# Patient Record
Sex: Female | Born: 1960 | Race: Black or African American | Hispanic: No | Marital: Married | Smoking: Never smoker
Health system: Southern US, Community
[De-identification: ages and names within clinical notes are randomized; demographics above are authoritative.]

## PROBLEM LIST (undated history)

## (undated) DIAGNOSIS — Z95810 Presence of automatic (implantable) cardiac defibrillator: Secondary | ICD-10-CM

## (undated) DIAGNOSIS — I5022 Chronic systolic (congestive) heart failure: Secondary | ICD-10-CM

## (undated) DIAGNOSIS — R51 Headache: Secondary | ICD-10-CM

## (undated) DIAGNOSIS — Z87442 Personal history of urinary calculi: Secondary | ICD-10-CM

## (undated) DIAGNOSIS — I428 Other cardiomyopathies: Secondary | ICD-10-CM

## (undated) DIAGNOSIS — R519 Headache, unspecified: Secondary | ICD-10-CM

## (undated) DIAGNOSIS — I509 Heart failure, unspecified: Secondary | ICD-10-CM

## (undated) DIAGNOSIS — I4729 Other ventricular tachycardia: Secondary | ICD-10-CM

## (undated) DIAGNOSIS — I472 Ventricular tachycardia: Secondary | ICD-10-CM

## (undated) DIAGNOSIS — I10 Essential (primary) hypertension: Secondary | ICD-10-CM

## (undated) DIAGNOSIS — N132 Hydronephrosis with renal and ureteral calculous obstruction: Secondary | ICD-10-CM

## (undated) DIAGNOSIS — Z91148 Patient's other noncompliance with medication regimen for other reason: Secondary | ICD-10-CM

## (undated) DIAGNOSIS — Z9114 Patient's other noncompliance with medication regimen: Secondary | ICD-10-CM

## (undated) DIAGNOSIS — I4891 Unspecified atrial fibrillation: Secondary | ICD-10-CM

## (undated) DIAGNOSIS — E669 Obesity, unspecified: Secondary | ICD-10-CM

## (undated) HISTORY — PX: TUBAL LIGATION: SHX77

## (undated) HISTORY — PX: CHOLECYSTECTOMY: SHX55

## (undated) HISTORY — PX: ABDOMINAL HYSTERECTOMY: SHX81

## (undated) HISTORY — DX: Obesity, unspecified: E66.9

## (undated) HISTORY — PX: TONSILLECTOMY: SUR1361

## (undated) HISTORY — PX: CARDIAC CATHETERIZATION: SHX172

---

## 1987-10-28 HISTORY — PX: BREAST LUMPECTOMY: SHX2

## 2000-06-03 ENCOUNTER — Other Ambulatory Visit: Admission: RE | Admit: 2000-06-03 | Discharge: 2000-06-03 | Payer: Self-pay | Admitting: Obstetrics and Gynecology

## 2000-08-20 ENCOUNTER — Encounter (INDEPENDENT_AMBULATORY_CARE_PROVIDER_SITE_OTHER): Payer: Self-pay | Admitting: Specialist

## 2000-08-20 ENCOUNTER — Inpatient Hospital Stay (HOSPITAL_COMMUNITY): Admission: RE | Admit: 2000-08-20 | Discharge: 2000-08-21 | Payer: Self-pay | Admitting: Obstetrics and Gynecology

## 2001-02-11 ENCOUNTER — Encounter: Payer: Self-pay | Admitting: Internal Medicine

## 2001-02-11 ENCOUNTER — Ambulatory Visit (HOSPITAL_COMMUNITY): Admission: RE | Admit: 2001-02-11 | Discharge: 2001-02-11 | Payer: Self-pay | Admitting: Internal Medicine

## 2001-07-16 ENCOUNTER — Other Ambulatory Visit: Admission: RE | Admit: 2001-07-16 | Discharge: 2001-07-16 | Payer: Self-pay | Admitting: Obstetrics and Gynecology

## 2002-09-14 ENCOUNTER — Emergency Department (HOSPITAL_COMMUNITY): Admission: EM | Admit: 2002-09-14 | Discharge: 2002-09-14 | Payer: Self-pay | Admitting: Emergency Medicine

## 2002-11-06 ENCOUNTER — Emergency Department (HOSPITAL_COMMUNITY): Admission: EM | Admit: 2002-11-06 | Discharge: 2002-11-06 | Payer: Self-pay | Admitting: Emergency Medicine

## 2002-11-06 ENCOUNTER — Encounter: Payer: Self-pay | Admitting: Emergency Medicine

## 2005-02-15 ENCOUNTER — Emergency Department (HOSPITAL_COMMUNITY): Admission: EM | Admit: 2005-02-15 | Discharge: 2005-02-15 | Payer: Self-pay | Admitting: Emergency Medicine

## 2005-06-05 ENCOUNTER — Ambulatory Visit (HOSPITAL_COMMUNITY): Admission: RE | Admit: 2005-06-05 | Discharge: 2005-06-05 | Payer: Self-pay | Admitting: Internal Medicine

## 2006-04-25 ENCOUNTER — Emergency Department (HOSPITAL_COMMUNITY): Admission: EM | Admit: 2006-04-25 | Discharge: 2006-04-25 | Payer: Self-pay | Admitting: Emergency Medicine

## 2006-05-19 ENCOUNTER — Inpatient Hospital Stay (HOSPITAL_COMMUNITY): Admission: EM | Admit: 2006-05-19 | Discharge: 2006-05-20 | Payer: Self-pay | Admitting: Emergency Medicine

## 2006-05-20 ENCOUNTER — Ambulatory Visit: Payer: Self-pay | Admitting: Gastroenterology

## 2006-06-21 ENCOUNTER — Emergency Department (HOSPITAL_COMMUNITY): Admission: EM | Admit: 2006-06-21 | Discharge: 2006-06-21 | Payer: Self-pay | Admitting: Emergency Medicine

## 2006-06-28 ENCOUNTER — Emergency Department (HOSPITAL_COMMUNITY): Admission: EM | Admit: 2006-06-28 | Discharge: 2006-06-28 | Payer: Self-pay | Admitting: Emergency Medicine

## 2006-07-21 ENCOUNTER — Encounter (HOSPITAL_COMMUNITY): Admission: RE | Admit: 2006-07-21 | Discharge: 2006-07-25 | Payer: Self-pay | Admitting: Internal Medicine

## 2006-08-25 ENCOUNTER — Ambulatory Visit (HOSPITAL_COMMUNITY): Admission: RE | Admit: 2006-08-25 | Discharge: 2006-08-25 | Payer: Self-pay | Admitting: Internal Medicine

## 2009-05-30 ENCOUNTER — Ambulatory Visit (HOSPITAL_COMMUNITY): Admission: RE | Admit: 2009-05-30 | Discharge: 2009-05-30 | Payer: Self-pay | Admitting: Internal Medicine

## 2010-07-05 ENCOUNTER — Ambulatory Visit (HOSPITAL_COMMUNITY): Admission: RE | Admit: 2010-07-05 | Discharge: 2010-07-05 | Payer: Self-pay | Admitting: Internal Medicine

## 2010-07-12 ENCOUNTER — Emergency Department (HOSPITAL_COMMUNITY): Admission: EM | Admit: 2010-07-12 | Discharge: 2010-07-12 | Payer: Self-pay | Admitting: Emergency Medicine

## 2010-10-06 ENCOUNTER — Inpatient Hospital Stay (HOSPITAL_COMMUNITY): Admission: EM | Admit: 2010-10-06 | Discharge: 2010-10-10 | Payer: Self-pay | Source: Home / Self Care

## 2010-10-07 ENCOUNTER — Encounter (INDEPENDENT_AMBULATORY_CARE_PROVIDER_SITE_OTHER): Payer: Self-pay | Admitting: Internal Medicine

## 2010-10-18 ENCOUNTER — Encounter: Payer: Self-pay | Admitting: Cardiology

## 2010-10-18 ENCOUNTER — Ambulatory Visit: Payer: Self-pay | Admitting: Cardiology

## 2010-10-18 DIAGNOSIS — I429 Cardiomyopathy, unspecified: Secondary | ICD-10-CM

## 2010-10-18 DIAGNOSIS — I10 Essential (primary) hypertension: Secondary | ICD-10-CM | POA: Insufficient documentation

## 2010-10-22 ENCOUNTER — Ambulatory Visit
Admission: RE | Admit: 2010-10-22 | Discharge: 2010-10-22 | Payer: Self-pay | Source: Home / Self Care | Attending: Cardiology | Admitting: Cardiology

## 2010-10-22 ENCOUNTER — Encounter: Payer: Self-pay | Admitting: Cardiology

## 2010-10-22 LAB — CONVERTED CEMR LAB
Calcium: 9.8 mg/dL (ref 8.4–10.5)
Glucose, Bld: 83 mg/dL (ref 70–99)

## 2010-10-27 DIAGNOSIS — I429 Cardiomyopathy, unspecified: Secondary | ICD-10-CM

## 2010-10-27 HISTORY — DX: Cardiomyopathy, unspecified: I42.9

## 2010-11-13 ENCOUNTER — Ambulatory Visit
Admission: RE | Admit: 2010-11-13 | Discharge: 2010-11-13 | Payer: Self-pay | Source: Home / Self Care | Attending: Cardiology | Admitting: Cardiology

## 2010-11-13 ENCOUNTER — Encounter: Payer: Self-pay | Admitting: Cardiology

## 2010-11-16 ENCOUNTER — Encounter: Payer: Self-pay | Admitting: Internal Medicine

## 2010-11-17 ENCOUNTER — Encounter: Payer: Self-pay | Admitting: Internal Medicine

## 2010-11-28 NOTE — Letter (Signed)
Summary: [place] [employer], agricultural at [facility]  [address], [place] [address]   Phone: [phone]  Fax: [phone]      October 22, 2010 MRN: [id number]   [facility] [address] [place], [place]  [address]   Dear Ms. [name],  Your test ordered by Selena Batten has been reviewed by your physician (or physician assistant) and was found to be normal or stable. Your physician (or physician assistant) felt no changes were needed at this time.  ____ Echocardiogram  ____ Cardiac Stress Test  __X__ Lab Work  ____ Peripheral vascular study of arms, legs or neck  ____ CT scan or X-ray  ____ Lung or Breathing test  ____ Other:  Please continue on current medical treatment.   Thank you.   Valera Castle, MD, F.A.C.C

## 2010-11-28 NOTE — Assessment & Plan Note (Signed)
Summary: rov 4 week f/u   Visit Type:  4 wk f/u Primary Provider:  Lonna Cobb  CC:  sob.Marland KitchenMarland KitchenMarland KitchenMarland Kitchenpt was told recently she had Torticollis 10/29/10.....  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  She says her blood pressure running around 1:15 systolic. It's been checked several times by family member who is a Copy. Every time she comes in the office is elevated. She says her resting heart rate is been running in the mid-to high 70s.  She denies orthopnea, PND or edema. Her stamina has increased. She is anxious to get back to work or to receive her temperature stability. It has been a problem with the paperwork.  Magnesium oxide was added by primary care.  She is very careful with sodium. She has lost another 4 pounds.    Current Medications (verified): 1)  Carvedilol 12.5 Mg Tabs (Carvedilol) .... Take 1 Tablet By Mouth Two Times A Day 2)  Furosemide 20 Mg Tabs (Furosemide) .... As Needed When Weight Is Up 2 Lb's or More 3)  Hydralazine Hcl 50 Mg Tabs (Hydralazine Hcl) .... Take 1 Tablet By Mouth Three Times A Day 4)  Isosorbide Dinitrate 20 Mg Tabs (Isosorbide Dinitrate) .... Take 1 Tab Two Times A Day 5)  Spironolactone 25 Mg Tabs (Spironolactone) .... Take 1 Tab Daily 6)  Proair Hfa 108 (90 Base) Mcg/act Aers (Albuterol Sulfate) .... Use As Directed 7)  Magnesium Oxide 400 Mg Tabs (Magnesium Oxide) .Marland Kitchen.. 1 Tab Four Times Daily 8)  Hydrocodone-Acetaminophen 5-500 Mg Tabs (Hydrocodone-Acetaminophen) .Marland Kitchen.. 1 Tab Four Times Daily As Needed  Allergies (verified): 1)  ! Morphine 2)  ! Ace Inhibitors  Past History:  Past Medical History: Last updated: 10/16/2010 dilated cardiomyopathy grade 1 diastolic dysfunction hypertension pericardial effusion nonsustained ventricular tachycardia hypokalemia  Past Surgical History: Last updated: 10/16/2010 child  birth  Social History: Last updated: 10/16/2010 Full Time Tobacco Use - No.  Alcohol Use - no Regular Exercise - no Drug Use - no  Risk Factors: Exercise: no (10/16/2010)  Risk Factors: Smoking Status: never (10/18/2010)  Review of Systems       negative history of present illness  Vital Signs:  Patient profile:   50 year old female Height:      64 inches Weight:      201.25 pounds BMI:     34.67 Pulse rate:   100 / minute Pulse rhythm:   irregular BP sitting:   150 / 110  (left arm) Cuff size:   large  Vitals Entered By: Danielle Rankin, CMA (November 13, 2010 1:45 PM)  Physical Exam  General:  overweight, in no acute distress Head:  normocephalic and atraumatic Eyes:  PERRLA/EOM intact; conjunctiva and lids normal. Neck:  Neck supple, no JVD. No masses, thyromegaly or abnormal cervical nodes. Lungs:  Clear bilaterally to auscultation and percussion. Heart:  PMI displaced inferolaterally, regular rate and rhythm, no gallop. Msk:  Back normal, normal gait. Muscle strength and tone normal. Pulses:  pulses normal in all 4 extremities Extremities:  No clubbing or cyanosis.no edema Neurologic:  Alert and oriented x 3. Skin:  Intact without lesions or rashes. Psych:  Normal affect.   Impression & Recommendations:  Problem # 1:  CARDIOMYOPATHY, SECONDARY (ICD-425.9) Will increase her carvedilol to 25 mg twice a day. Schedule echocardiogram first week of March. She will see me at that time. Her updated medication list for this problem includes:  Carvedilol 25 Mg Tabs (Carvedilol) .Marland Kitchen... Take one tablet by mouth twice a day    Furosemide 20 Mg Tabs (Furosemide) .Marland Kitchen... As needed when weight is up 2 lb's or more    Isosorbide Dinitrate 20 Mg Tabs (Isosorbide dinitrate) .Marland Kitchen... Take 1 tab two times a day    Spironolactone 25 Mg Tabs (Spironolactone) .Marland Kitchen... Take 1 tab daily  Orders: EKG w/ Interpretation (93000) Echocardiogram (Echo)  Problem # 2:  CHRONIC SYSTOLIC HEART  FAILURE (ICD-428.22)  Her updated medication list for this problem includes:    Carvedilol 25 Mg Tabs (Carvedilol) .Marland Kitchen... Take one tablet by mouth twice a day    Furosemide 20 Mg Tabs (Furosemide) .Marland Kitchen... As needed when weight is up 2 lb's or more    Isosorbide Dinitrate 20 Mg Tabs (Isosorbide dinitrate) .Marland Kitchen... Take 1 tab two times a day    Spironolactone 25 Mg Tabs (Spironolactone) .Marland Kitchen... Take 1 tab daily  Orders: EKG w/ Interpretation (93000) Echocardiogram (Echo)  Problem # 3:  HYPERTENSION (ICD-401.9)  Her updated medication list for this problem includes:    Carvedilol 25 Mg Tabs (Carvedilol) .Marland Kitchen... Take one tablet by mouth twice a day    Furosemide 20 Mg Tabs (Furosemide) .Marland Kitchen... As needed when weight is up 2 lb's or more    Hydralazine Hcl 100 Mg Tabs (Hydralazine hcl) .Marland Kitchen... Take 1 tablet twice a day    Spironolactone 25 Mg Tabs (Spironolactone) .Marland Kitchen... Take 1 tab daily  Patient Instructions: 1)  Your physician recommends that you schedule a follow-up appointment in: 1st week of March with Dr. Daleen Squibb after ECHO 2)  Your physician has recommended you make the following change in your medication:  3)  Your physician has requested that you have an echocardiogram.  Echocardiography is a painless test that uses sound waves to create images of your heart. It provides your doctor with information about the size and shape of your heart and how well your heart's chambers and valves are working.  This procedure takes approximately one hour. There are no restrictions for this procedure. 1st week of March. Appt with Dr. Daleen Squibb to follow. Prescriptions: HYDRALAZINE HCL 100 MG TABS (HYDRALAZINE HCL) Take 1 tablet twice a day  #60 x 11   Entered by:   Lisabeth Devoid RN   Authorized by:   Gaylord Shih, MD, [facility]   Signed by:   Lisabeth Devoid RN on 11/13/2010   Method used:   Electronically to        [facility] (retail)       [address] [place], [place]  [address]        Ph: [phone]       Fax: [phone]   RxID:   [id number] CARVEDILOL 25 MG TABS (CARVEDILOL) Take one tablet by mouth twice a day  #60 x 11   Entered by:   Lisabeth Devoid RN   Authorized by:   Gaylord Shih, MD, [facility]   Signed by:   Lisabeth Devoid RN on 11/13/2010   Method used:   Electronically to        [facility] (retail)       [address]       [place], [place]  [address]       Ph: [phone]       Fax: [phone]   RxID:   [id number]

## 2010-11-28 NOTE — Assessment & Plan Note (Signed)
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 She is ejection fraction around 10-15%. Please for the discharge summary.  She also has severe and difficult to control hypertension. This may contribute to her cardiomyopathy as well.  She's been very compliant she is she's gone home. I have put her out of work for rest. She is watching salt. Her weight is down considerably since going home. She weighed 194 this morning.  Her blood pressure and running around 120/80 at rest. Her she denies orthopnea, PND or edema. She's had no chest pain. No palpitations and no syncope or presyncope.  Preventive Screening-Counseling & Management  Alcohol-Tobacco     Smoking Status: never  Current Medications (verified): 1)  Carvedilol 12.5 Mg Tabs (Carvedilol) .... Take 1 Tablet By Mouth Two Times A Day 2)  Furosemide 20 Mg Tabs (Furosemide) .... Take 1 Tab Daily 3)  Hydralazine Hcl 50 Mg Tabs (Hydralazine Hcl) .... Take 1 Tablet By Mouth Three Times A Day 4)  Isosorbide Dinitrate 20 Mg Tabs (Isosorbide Dinitrate) .... Take 1 Tab Two Times A Day 5)  Spironolactone 25 Mg Tabs (Spironolactone) .... Take 1 Tab Daily 6)  Proair Hfa 108 (90 Base) Mcg/act Aers (Albuterol Sulfate) .... Use As Directed  Allergies (verified): No Known Drug Allergies  Comments:  Nurse/Medical Assistant: The patient's medications were reviewed with the patient and were updated in the Medication List. Bottles reviewed w/ patient. Tammi Romine CMA (October 18, 2010 2:13 PM)  Past History:  Past Medical History: Last updated: 10/16/2010 dilated cardiomyopathy grade 1 diastolic dysfunction hypertension pericardial effusion nonsustained ventricular tachycardia hypokalemia  Past Surgical History: Last updated: 10/16/2010 child birth  Social History: Last updated: 10/16/2010 Full  Time Tobacco Use - No.  Alcohol Use - no Regular Exercise - no Drug Use - no  Risk Factors: Exercise: no (10/16/2010)  Risk Factors: Smoking Status: never (10/18/2010)  Review of Systems       negative other than history of present illness  Vital Signs:  Patient profile:   50 year old female Height:      64 inches Weight:      203 pounds BMI:     34.97 O2 Sat:      98 % on Room air Pulse rate:   104 / minute BP sitting:   139 / 92  (left arm) Cuff size:   regular  Vitals Entered By: Fuller Plan CMA (October 18, 2010 2:14 PM)  Nutrition Counseling: Patient's BMI is greater than 25 and therefore counseled on weight management options.  O2 Flow:  Room air  Physical Exam  General:  obese.  pleasant, no acute distress Head:  normocephalic and atraumatic Eyes:  PERRLA/EOM intact; conjunctiva and lids normal. Neck:  Neck supple, no JVD. No masses, thyromegaly or abnormal cervical nodes. Chest Yvonna Brun:  no deformities or breast masses noted Lungs:  Clear bilaterally to auscultation and percussion. Heart:  PMI displaced inferolaterally, no S3 gallop today. Msk:  Back normal, normal gait. Muscle strength and tone normal. Pulses:  pulses normal in all 4 extremities Extremities:  No clubbing or cyanosis.no edema Skin:  Intact without lesions or rashes. Psych:  Normal affect.   Impression & Recommendations:  Problem # 1:  CARDIOMYOPATHY, SECONDARY (ICD-425.9) Assessment Unchanged Her heart rate is too fast and her blood pressure still low but high. We'll increase her carvedilol to 12-1/2  mg twice a day and her hydralazine to 50 mg t.i.d. I will have her return next week for blood pressure and heart rate check. Repeat echocardiogram mid-March. Pray for recovery. Her updated medication list for this problem includes:    Carvedilol 12.5 Mg Tabs (Carvedilol) .Marland Kitchen... Take 1 tablet by mouth two times a day    Furosemide 20 Mg Tabs (Furosemide) .Marland Kitchen... Take 1 tab daily    Isosorbide  Dinitrate 20 Mg Tabs (Isosorbide dinitrate) .Marland Kitchen... Take 1 tab two times a day    Spironolactone 25 Mg Tabs (Spironolactone) .Marland Kitchen... Take 1 tab daily  Problem # 2:  CHRONIC SYSTOLIC HEART FAILURE (ICD-428.22)  Her updated medication list for this problem includes:    Carvedilol 12.5 Mg Tabs (Carvedilol) .Marland Kitchen... Take 1 tablet by mouth two times a day    Furosemide 20 Mg Tabs (Furosemide) .Marland Kitchen... Take 1 tab daily    Isosorbide Dinitrate 20 Mg Tabs (Isosorbide dinitrate) .Marland Kitchen... Take 1 tab two times a day    Spironolactone 25 Mg Tabs (Spironolactone) .Marland Kitchen... Take 1 tab daily  Problem # 3:  HYPERTENSION (ICD-401.9) Assessment: Improved  Her updated medication list for this problem includes:    Carvedilol 12.5 Mg Tabs (Carvedilol) .Marland Kitchen... Take 1 tablet by mouth two times a day    Furosemide 20 Mg Tabs (Furosemide) .Marland Kitchen... Take 1 tab daily    Hydralazine Hcl 50 Mg Tabs (Hydralazine hcl) .Marland Kitchen... Take 1 tablet by mouth three times a day    Spironolactone 25 Mg Tabs (Spironolactone) .Marland Kitchen... Take 1 tab daily  Orders: T-Basic Metabolic Panel [id number])  Patient Instructions: 1)  Your physician recommends that you schedule a follow-up appointment in: Tuesday for blood pressure and heart rate check with nurse and in 4 weeks in [place] office with Dr. Daleen Squibb. 2)  Your physician recommends that you return for lab work in: Today. 3)  Your physician has recommended you make the following change in your medication: Increase Carvedilol (Coreg) to 12.5mg  by mouth two times a day and increase Hydralazine to 50mg  by mouth three times a day  Prescriptions: HYDRALAZINE HCL 50 MG TABS (HYDRALAZINE HCL) take 1 tablet by mouth three times a day  #90 x 3   Entered by:   Larita Fife Via LPN   Authorized by:   Gaylord Shih, MD, [facility]   Signed by:   Larita Fife Via LPN on 60/63/0160   Method used:   Electronically to        [facility] (retail)       [address]       [place], [place]  [address]        Ph: [phone]       Fax: [phone]   RxID:   [id number] CARVEDILOL 12.5 MG TABS (CARVEDILOL) Take 1 tablet by mouth two times a day  #60 x 3   Entered by:   Larita Fife Via LPN   Authorized by:   Gaylord Shih, MD, [facility]   Signed by:   Larita Fife Via LPN on 26/94/8546   Method used:   Electronically to        [facility] (retail)       [address]       [place], [place]  [address]       Ph: [phone]       Fax: [phone]   RxID:   [id number]

## 2010-11-28 NOTE — Assessment & Plan Note (Signed)
Summary: bp and heart rate check per checkout on 12/23/tg  Nurse Visit   Vital Signs:  Patient profile:   50 year old female Weight:      204 pounds BMI:     35.14 O2 Sat:      98 % on Room air Pulse rate:   98 / minute BP sitting:   142 / 93  (right arm)  Vitals Entered By: Dreama Saa, CNA (October 22, 2010 4:37 PM)  O2 Flow:  Room air  Current Medications (verified): 1)  Carvedilol 12.5 Mg Tabs (Carvedilol) .... Take 1 Tablet By Mouth Two Times A Day 2)  Furosemide 20 Mg Tabs (Furosemide) .... Take 1 Tab Daily 3)  Hydralazine Hcl 50 Mg Tabs (Hydralazine Hcl) .... Take 1 Tablet By Mouth Three Times A Day 4)  Isosorbide Dinitrate 20 Mg Tabs (Isosorbide Dinitrate) .... Take 1 Tab Two Times A Day 5)  Spironolactone 25 Mg Tabs (Spironolactone) .... Take 1 Tab Daily 6)  Proair Hfa 108 (90 Base) Mcg/act Aers (Albuterol Sulfate) .... Use As Directed  Allergies (verified): No Known Drug Allergies  Comments:  Nurse/Medical Assistant: patient didnt bring meds reviewed from previous ov pharmacy is [place] [facility]  Visit Type:  1 week nurse visit Primary Provider:  Lonna Cobb   History of Present Illness: denies complaints, did not return bp diary

## 2010-12-30 ENCOUNTER — Ambulatory Visit (INDEPENDENT_AMBULATORY_CARE_PROVIDER_SITE_OTHER): Payer: 59 | Admitting: Cardiology

## 2010-12-30 ENCOUNTER — Ambulatory Visit (HOSPITAL_COMMUNITY): Payer: 59 | Attending: Cardiology

## 2010-12-30 ENCOUNTER — Encounter: Payer: Self-pay | Admitting: Cardiology

## 2010-12-30 DIAGNOSIS — E669 Obesity, unspecified: Secondary | ICD-10-CM | POA: Insufficient documentation

## 2010-12-30 DIAGNOSIS — I059 Rheumatic mitral valve disease, unspecified: Secondary | ICD-10-CM | POA: Insufficient documentation

## 2010-12-30 DIAGNOSIS — I428 Other cardiomyopathies: Secondary | ICD-10-CM | POA: Insufficient documentation

## 2010-12-30 DIAGNOSIS — I429 Cardiomyopathy, unspecified: Secondary | ICD-10-CM

## 2010-12-30 DIAGNOSIS — I509 Heart failure, unspecified: Secondary | ICD-10-CM | POA: Insufficient documentation

## 2010-12-30 DIAGNOSIS — I10 Essential (primary) hypertension: Secondary | ICD-10-CM

## 2010-12-30 DIAGNOSIS — I5022 Chronic systolic (congestive) heart failure: Secondary | ICD-10-CM

## 2011-01-01 ENCOUNTER — Telehealth: Payer: Self-pay | Admitting: Cardiology

## 2011-01-03 ENCOUNTER — Telehealth (INDEPENDENT_AMBULATORY_CARE_PROVIDER_SITE_OTHER): Payer: Self-pay | Admitting: *Deleted

## 2011-01-06 LAB — DIFFERENTIAL
Basophils Absolute: 0.1 10*3/uL (ref 0.0–0.1)
Basophils Relative: 1 % (ref 0–1)
Basophils Relative: 1 % (ref 0–1)
Eosinophils Relative: 5 % (ref 0–5)
Lymphocytes Relative: 33 % (ref 12–46)
Lymphocytes Relative: 39 % (ref 12–46)
Lymphs Abs: 2.8 10*3/uL (ref 0.7–4.0)
Lymphs Abs: 3.7 10*3/uL (ref 0.7–4.0)
Monocytes Relative: 7 % (ref 3–12)
Neutro Abs: 4.5 10*3/uL (ref 1.7–7.7)
Neutrophils Relative %: 52 % (ref 43–77)
Neutrophils Relative %: 48 % (ref 43–77)

## 2011-01-06 LAB — BLOOD GAS, ARTERIAL
Bicarbonate: 24 mEq/L (ref 20.0–24.0)
O2 Content: 2.5 L/min
Patient temperature: 37
TCO2: 21.9 mmol/L (ref 0–100)
pH, Arterial: 7.403 — ABNORMAL HIGH (ref 7.350–7.400)
pO2, Arterial: 147 mmHg — ABNORMAL HIGH (ref 80.0–100.0)

## 2011-01-06 LAB — COMPREHENSIVE METABOLIC PANEL
ALT: 122 U/L — ABNORMAL HIGH (ref 0–35)
Albumin: 3.2 g/dL — ABNORMAL LOW (ref 3.5–5.2)
Alkaline Phosphatase: 33 U/L — ABNORMAL LOW (ref 39–117)
BUN: 24 mg/dL — ABNORMAL HIGH (ref 6–23)
CO2: 26 mEq/L (ref 19–32)
Calcium: 8.2 mg/dL — ABNORMAL LOW (ref 8.4–10.5)
Creatinine, Ser: 1.11 mg/dL (ref 0.4–1.2)
Total Bilirubin: 0.8 mg/dL (ref 0.3–1.2)
Total Protein: 6 g/dL (ref 6.0–8.3)

## 2011-01-06 LAB — POCT CARDIAC MARKERS: Troponin i, poc: <0.05 ng/mL (ref 0.00–0.09)

## 2011-01-06 LAB — BASIC METABOLIC PANEL
BUN: 16 mg/dL (ref 6–23)
CO2: 27 mEq/L (ref 19–32)
CO2: 27 mEq/L (ref 19–32)
CO2: 31 mEq/L (ref 19–32)
Calcium: 8.8 mg/dL (ref 8.4–10.5)
Calcium: 9.1 mg/dL (ref 8.4–10.5)
Chloride: 101 mEq/L (ref 96–112)
Creatinine, Ser: 1.05 mg/dL (ref 0.4–1.2)
Creatinine, Ser: 0.95 mg/dL (ref 0.4–1.2)
Creatinine, Ser: 1.21 mg/dL — ABNORMAL HIGH (ref 0.4–1.2)
GFR calc Af Amer: >60 mL/min (ref >60)
GFR calc non Af Amer: 47 mL/min — ABNORMAL LOW (ref >60)
Glucose, Bld: 125 mg/dL — ABNORMAL HIGH (ref 70–99)
Potassium: 3.5 mEq/L (ref 3.5–5.1)
Sodium: 139 mEq/L (ref 135–145)
Sodium: 140 mEq/L (ref 135–145)

## 2011-01-06 LAB — CBC
HCT: 33.4 % — ABNORMAL LOW (ref 36.0–46.0)
HCT: 36 % (ref 36.0–46.0)
Hemoglobin: 11.3 g/dL — ABNORMAL LOW (ref 12.0–15.0)
MCH: 29.6 pg (ref 26.0–34.0)
MCHC: 33.8 g/dL (ref 30.0–36.0)
MCHC: 33.9 g/dL (ref 30.0–36.0)
MCV: 87.4 fL (ref 78.0–100.0)
Platelets: 199 10*3/uL (ref 150–400)
Platelets: 219 10*3/uL (ref 150–400)
RBC: 3.82 MIL/uL — ABNORMAL LOW (ref 3.87–5.11)
RBC: 4.04 MIL/uL (ref 3.87–5.11)
RDW: 15.7 % — ABNORMAL HIGH (ref 11.5–15.5)
RDW: 15.5 % (ref 11.5–15.5)

## 2011-01-06 LAB — HEPATIC FUNCTION PANEL
AST: 70 U/L — ABNORMAL HIGH (ref 0–37)
Alkaline Phosphatase: 39 U/L (ref 39–117)
Bilirubin, Direct: 0.3 mg/dL (ref 0.0–0.3)
Total Bilirubin: 1.2 mg/dL (ref 0.3–1.2)
Total Protein: 6.6 g/dL (ref 6.0–8.3)

## 2011-01-06 LAB — CARDIAC PANEL(CRET KIN+CKTOT+MB+TROPI): Relative Index: 2.1 (ref 0.0–2.5)

## 2011-01-06 LAB — TSH: TSH: 0.957 u[IU]/mL (ref 0.350–4.500)

## 2011-01-07 NOTE — Progress Notes (Signed)
  Request Received from [employer] sent to [facility]  January 03, 2011 9:59 AM

## 2011-01-07 NOTE — Assessment & Plan Note (Signed)
Summary: f/u echo done today at 1pm/sl/hm   Visit Type:  follow up echo done today. Primary Provider:  Lonna Cobb  CC:  right ear  pt states she hears a strange sound. Has ? furosemide and isosorbide.Marland Kitchen  History of Present Illness: Anita Warren returns today for E and M of her secondary CM. She feels like her BP is not optimal recently.Denies any ortopnea, PND, edema.  She seems compliant with diet and meds. She has lost 4-5 lbs.  Current Medications (verified): 1)  Carvedilol 25 Mg Tabs (Carvedilol) .... Take One Tablet By Mouth Twice A Day 2)  Hydralazine Hcl 100 Mg Tabs (Hydralazine Hcl) .... Take 1 Tablet Twice A Day 3)  Spironolactone 25 Mg Tabs (Spironolactone) .... Take 1 Tab Twice Daily 4)  Magnesium Oxide 400 Mg Tabs (Magnesium Oxide) .Marland Kitchen.. 1 Tab Two Times Daily  Allergies (verified): 1)  ! Morphine 2)  ! Ace Inhibitors  Past History:  Past Medical History: Last updated: 10/16/2010 dilated cardiomyopathy grade 1 diastolic dysfunction hypertension pericardial effusion nonsustained ventricular tachycardia hypokalemia  Past Surgical History: Last updated: 10/16/2010 child birth  Social History: Last updated: 10/16/2010 Full Time Tobacco Use - No.  Alcohol Use - no Regular Exercise - no Drug Use - no  Risk Factors: Exercise: no (10/16/2010)  Risk Factors: Smoking Status: never (10/18/2010)  Review of Systems       Negative other than HPI  Vital Signs:  Patient profile:   50 year old female Height:      64 inches Weight:      205.50 pounds BMI:     35.40 Pulse rate:   70 / minute Resp:     16 per minute BP sitting:   162 / 102  (left arm) Cuff size:   large  Vitals Entered By: Celestia Khat, CMA (December 30, 2010 2:26 PM)  Physical Exam  General:  obese.  obese.   Head:  normocephalic and atraumatic Eyes:  PERRLA/EOM intact; conjunctiva and lids normal. Neck:  Neck supple, no JVD. No masses, thyromegaly or abnormal cervical  nodes. Chest Finnis Colee:  no deformities or breast masses noted Lungs:  Clear bilaterally to auscultation and percussion. Heart:  RRR, without S3 Msk:  Back normal, normal gait. Muscle strength and tone normal. Pulses:  pulses normal in all 4 extremities Extremities:  No clubbing or cyanosis. Neurologic:  Alert and oriented x 3. Skin:  Intact without lesions or rashes. Psych:  Normal affect.    Impression & Recommendations:  Problem # 1:  CHRONIC SYSTOLIC HEART FAILURE (ICD-428.22) Assessment Unchanged Will maximize her hydralazine to 100mg  three times a day.She will keep BP log. The following medications were removed from the medication list:    Furosemide 20 Mg Tabs (Furosemide) .Marland Kitchen... As needed when weight is up 2 lb's or more    Isosorbide Dinitrate 20 Mg Tabs (Isosorbide dinitrate) .Marland Kitchen... Take 1 tab two times a day Her updated medication list for this problem includes:    Carvedilol 25 Mg Tabs (Carvedilol) .Marland Kitchen... Take one tablet by mouth twice a day    Spironolactone 25 Mg Tabs (Spironolactone) .Marland Kitchen... Take 1 tab twice daily  The following medications were removed from the medication list:    Furosemide 20 Mg Tabs (Furosemide) .Marland Kitchen... As needed when weight is up 2 lb's or more    Isosorbide Dinitrate 20 Mg Tabs (Isosorbide dinitrate) .Marland Kitchen... Take 1 tab two times a day Her updated medication list for this problem includes:    Carvedilol 25  Mg Tabs (Carvedilol) .Marland Kitchen... Take one tablet by mouth twice a day    Spironolactone 25 Mg Tabs (Spironolactone) .Marland Kitchen... Take 1 tab twice daily  Problem # 2:  CARDIOMYOPATHY, SECONDARY (ICD-425.9) Will await final Echo report. If EF still <35% will refer to Dr Johney Frame for AICD. He may want ischemic evaluation with a cath....will let him decide. The following medications were removed from the medication list:    Furosemide 20 Mg Tabs (Furosemide) .Marland Kitchen... As needed when weight is up 2 lb's or more    Isosorbide Dinitrate 20 Mg Tabs (Isosorbide dinitrate) .Marland Kitchen... Take  1 tab two times a day Her updated medication list for this problem includes:    Carvedilol 25 Mg Tabs (Carvedilol) .Marland Kitchen... Take one tablet by mouth twice a day    Spironolactone 25 Mg Tabs (Spironolactone) .Marland Kitchen... Take 1 tab twice daily  The following medications were removed from the medication list:    Furosemide 20 Mg Tabs (Furosemide) .Marland Kitchen... As needed when weight is up 2 lb's or more    Isosorbide Dinitrate 20 Mg Tabs (Isosorbide dinitrate) .Marland Kitchen... Take 1 tab two times a day Her updated medication list for this problem includes:    Carvedilol 25 Mg Tabs (Carvedilol) .Marland Kitchen... Take one tablet by mouth twice a day    Spironolactone 25 Mg Tabs (Spironolactone) .Marland Kitchen... Take 1 tab twice daily  Problem # 3:  HYPERTENSION (ICD-401.9) Assessment: Unchanged See above. The following medications were removed from the medication list:    Furosemide 20 Mg Tabs (Furosemide) .Marland Kitchen... As needed when weight is up 2 lb's or more Her updated medication list for this problem includes:    Carvedilol 25 Mg Tabs (Carvedilol) .Marland Kitchen... Take one tablet by mouth twice a day    Hydralazine Hcl 100 Mg Tabs (Hydralazine hcl) .Marland Kitchen... Take 1 tablet by mouth three times a day    Spironolactone 25 Mg Tabs (Spironolactone) .Marland Kitchen... Take 1 tab twice daily  The following medications were removed from the medication list:    Furosemide 20 Mg Tabs (Furosemide) .Marland Kitchen... As needed when weight is up 2 lb's or more Her updated medication list for this problem includes:    Carvedilol 25 Mg Tabs (Carvedilol) .Marland Kitchen... Take one tablet by mouth twice a day    Hydralazine Hcl 100 Mg Tabs (Hydralazine hcl) .Marland Kitchen... Take 1 tablet twice a day    Spironolactone 25 Mg Tabs (Spironolactone) .Marland Kitchen... Take 1 tab twice daily  Patient Instructions: 1)  We will call you regarding follow up appointment 2)  Your physician has recommended you make the following change in your medication: Increase hydralazine to 100 mg by mouth three times a day 3)  Your physician has requested  that you regularly monitor and record your blood pressure readings at home.  Please use the same machine at the same time of day to check your readings and record them to bring to your follow-up visit. Prescriptions: HYDRALAZINE HCL 100 MG TABS (HYDRALAZINE HCL) Take 1 tablet by mouth three times a day  #90 x 6   Entered by:   Dossie Arbour, RN, BSN   Authorized by:   Gaylord Shih, MD, [facility]   Signed by:   Dossie Arbour, RN, BSN on 12/30/2010   Method used:   Electronically to        [facility] (retail)       [address]       [place], [place]  [address]       Ph: [phone]       Fax: [phone]   RxID:   [id number]

## 2011-01-07 NOTE — Progress Notes (Signed)
Summary: pt wants echo results  Phone Note Call from Patient   Caller: Patient [phone] Reason for Call: Talk to Nurse, Lab or Test Results Summary of Call: pt callinvg re echo results Initial call taken by: Glynda Jaeger,  January 01, 2011 9:44 AM  Follow-up for Phone Call        patient aware* Referral put in to see Dr. Trula Ore RN  January 01, 2011 10:59 AM  Follow-up by: Whitney Maeola Sarah RN,  January 01, 2011 10:59 AM

## 2011-01-09 LAB — DIFFERENTIAL
Basophils Relative: 1 % (ref 0–1)
Eosinophils Absolute: 0.6 10*3/uL (ref 0.0–0.7)
Eosinophils Relative: 6 % — ABNORMAL HIGH (ref 0–5)
Lymphs Abs: 3.2 10*3/uL (ref 0.7–4.0)
Monocytes Absolute: 0.5 10*3/uL (ref 0.1–1.0)
Neutro Abs: 5.8 10*3/uL (ref 1.7–7.7)

## 2011-01-09 LAB — BASIC METABOLIC PANEL
CO2: 27 mEq/L (ref 19–32)
Calcium: 8.8 mg/dL (ref 8.4–10.5)
Chloride: 105 mEq/L (ref 96–112)
Glucose, Bld: 145 mg/dL — ABNORMAL HIGH (ref 70–99)
Potassium: 3.3 mEq/L — ABNORMAL LOW (ref 3.5–5.1)
Sodium: 138 mEq/L (ref 135–145)

## 2011-01-09 LAB — CBC
HCT: 35.2 % — ABNORMAL LOW (ref 36.0–46.0)
Hemoglobin: 12 g/dL (ref 12.0–15.0)
MCH: 30.2 pg (ref 26.0–34.0)
MCHC: 34.2 g/dL (ref 30.0–36.0)
MCV: 88.2 fL (ref 78.0–100.0)
RBC: 3.99 MIL/uL (ref 3.87–5.11)

## 2011-01-11 ENCOUNTER — Encounter: Payer: Self-pay | Admitting: Cardiology

## 2011-01-20 ENCOUNTER — Ambulatory Visit (INDEPENDENT_AMBULATORY_CARE_PROVIDER_SITE_OTHER): Payer: 59 | Admitting: Internal Medicine

## 2011-01-20 ENCOUNTER — Encounter: Payer: Self-pay | Admitting: *Deleted

## 2011-01-20 ENCOUNTER — Encounter: Payer: Self-pay | Admitting: Internal Medicine

## 2011-01-20 VITALS — BP 168/100 | HR 88 | Resp 18 | Ht 64.0 in | Wt 204.8 lb

## 2011-01-20 DIAGNOSIS — I5022 Chronic systolic (congestive) heart failure: Secondary | ICD-10-CM

## 2011-01-20 DIAGNOSIS — I509 Heart failure, unspecified: Secondary | ICD-10-CM

## 2011-01-20 DIAGNOSIS — Z01810 Encounter for preprocedural cardiovascular examination: Secondary | ICD-10-CM

## 2011-01-20 DIAGNOSIS — I10 Essential (primary) hypertension: Secondary | ICD-10-CM

## 2011-01-20 DIAGNOSIS — I429 Cardiomyopathy, unspecified: Secondary | ICD-10-CM

## 2011-01-20 LAB — CBC WITH DIFFERENTIAL/PLATELET
Eosinophils Relative: 3.8 % (ref 0.0–5.0)
HCT: 37.8 % (ref 36.0–46.0)
Hemoglobin: 13.1 g/dL (ref 12.0–15.0)
Lymphs Abs: 2.6 10*3/uL (ref 0.7–4.0)
MCV: 90.6 fl (ref 78.0–100.0)
Monocytes Absolute: 0.5 10*3/uL (ref 0.1–1.0)
Monocytes Relative: 7.7 % (ref 3.0–12.0)
Neutro Abs: 3.6 10*3/uL (ref 1.4–7.7)
Platelets: 166 10*3/uL (ref 150.0–400.0)
RDW: 15.2 % — ABNORMAL HIGH (ref 11.5–14.6)
WBC: 7.1 10*3/uL (ref 4.5–10.5)

## 2011-01-20 LAB — BASIC METABOLIC PANEL
BUN: 19 mg/dL (ref 6–23)
Chloride: 102 mEq/L (ref 96–112)
Glucose, Bld: 94 mg/dL (ref 70–99)
Potassium: 4.3 mEq/L (ref 3.5–5.1)
Sodium: 136 mEq/L (ref 135–145)

## 2011-01-20 LAB — APTT: aPTT: 25.9 s (ref 21.7–28.8)

## 2011-01-20 NOTE — Progress Notes (Signed)
[name] is a pleasant 50 yo AAF with a h/o congestive heart failure diagnosed 12/11, refractory hypertension, and obesity who presents today for EP consultation for further risk stratification of sudden death.  She reports initially being diagnosed with CHF 12/11 after presenting to [facility] with progressive dyspnea.  She reports that she had had similar symptoms for 6 months but was felt to have bronchitis.  She has subsequently been placed on medical therapy for hypertension and CHF with some improvement in symptoms.  She continues to have dyspnea with moderate activity and does not feel that she could walk more than several blocks or climb one flight of stairs.  She lives a relatively sedentary lifestyle.  She reports compliance with medical therapy.  Though she denies chest pain, she reports frequent pain under her shoulder blades with moderate activity.  Today, she denies symptoms of palpitations,orthopnea, PND, lower extremity edema, dizziness, presyncope, syncope, or neurologic sequela. The patient is tolerating medications without difficulties and is otherwise without complaint today.   Past Medical History  Diagnosis Date  . 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 HTN (hypertension)   . VT (ventricular tachycardia)   . Hypokalemia   . Obesity     Current outpatient prescriptions:carvedilol (COREG) 25 MG tablet, Take 25 mg by mouth 2 (two) times daily with a meal.  , Disp: , Rfl: ;  hydrALAZINE (APRESOLINE) 100 MG tablet, Take 100 mg by mouth 3 (three) times daily.  , Disp: , Rfl: ;  magnesium oxide (MAG-OX) 400 MG tablet, Take 400 mg by mouth 2 (two) times daily. , Disp: , Rfl: ;  spironolactone (ALDACTONE) 25 MG tablet, Take 25 mg by mouth 2 (two) times daily. , Disp: , Rfl:   Allergies  Allergen Reactions  . Ace Inhibitors     REACTION: angioedema  . Morphine     REACTION: vomiting    History   Social History  . Marital Status:  Married    Spouse Name: N/A    Number of Children: N/A  . Years of Education: N/A   Occupational History  . Not on file.   Social History Main Topics  . Smoking status: Never Smoker   . Smokeless tobacco: Not on file  . Alcohol Use: No  . Drug Use: No  . Sexually Active: Not on file   Other Topics Concern  . Not on file   Social History Narrative  . No narrative on file    Family History  Problem Relation Age of Onset  . Hypertension    . Diabetes      ROS- All systems are reviewed and negative except as per the HPI above  Physical Exam: Filed Vitals:   01/20/11 1207  BP: 168/100  Pulse: 88  Resp: 18  Height: 5\' 4"  (1.626 m)  Weight: 204 lb 12.8 oz (92.897 kg)    GEN- The patient is obese, alert and oriented x 3 today.   Head- normocephalic, atraumatic Eyes-  Sclera clear, conjunctiva pink Ears- hearing intact Oropharynx- clear Neck- supple, no JVP Lymph- no cervical lymphadenopathy Lungs- Clear to ausculation bilaterally, normal work of breathing Heart- Regular rate and rhythm, no murmurs, rubs or gallops, PMI not laterally displaced GI- soft, NT, ND, + BS Extremities- no clubbing, cyanosis, or edema MS- no significant deformity or atrophy Skin- no rash or lesion Psych- euthymic mood, full affect Neuro- strength and sensation are intact  ekg 11/13/10- sinus rhythm 100 bpm,  QRS 94 ms, nonspecific ST/T changes  Echo 12/30/10- LVEF 20-25% with diffuse HK, LVEDD 64, LA 51, mild MR

## 2011-01-20 NOTE — Assessment & Plan Note (Signed)
Stable NYHA Class III CHF despite an optimal medical regimen. She is not a candidate for ACE inhibitors or ARBs due to angioedema with ace inhibitors in the past.

## 2011-01-20 NOTE — Patient Instructions (Addendum)
Your physician recommends that you schedule a follow-up appointment in: 4 WEEKS WITH DR [facility] Your physician has requested that you have a cardiac catheterization. Cardiac catheterization is used to diagnose and/or treat various heart conditions. Doctors may recommend this procedure for a number of different reasons. The most common reason is to evaluate chest pain. Chest pain can be a symptom of coronary artery disease (CAD), and cardiac catheterization can show whether plaque is narrowing or blocking your heart's arteries. This procedure is also used to evaluate the valves, as well as measure the blood flow and oxygen levels in different parts of your heart. For further information please visit [email or web address] Please follow instruction sheet, as given. ALSO BILATERAL  RENAL GRAM  Your physician recommends that you return for lab work in: bmet cbc pt ptt dx v72.81

## 2011-01-20 NOTE — Assessment & Plan Note (Signed)
Stable with medical therapy We will plan renal arteriogram at same time as cath (as above) to exclude RAS. Salt restriction.

## 2011-01-20 NOTE — Assessment & Plan Note (Signed)
The patient has a presumed nonischemic cardiomyopathy.  I suspect that longstanding hypertension is the cause of her CM.  I do however think that we should exclude ischemia before offering ICD, particularly given her symptoms of subscapular pain with exertion.  Given her significant structural heart disease (LVEDD 64 and EF 20%), I feel that a cath would be the best test for further ischemic evaluation in this patient. Risks, benefits, and alternatives of cath were discussed at length with the patient today who wishes to proceed.  Given her longstanding and difficult to control HTN, I think that renal arteriogram performed at the same time would offer useful clinical information as we exclude RAS. The patient wishes to return to discuss ICD implantation following her cath.  I have provided her with a handout today outlining ICD implantation. She will return in 4 weeks.

## 2011-01-28 ENCOUNTER — Inpatient Hospital Stay (HOSPITAL_BASED_OUTPATIENT_CLINIC_OR_DEPARTMENT_OTHER)
Admission: RE | Admit: 2011-01-28 | Discharge: 2011-01-28 | Disposition: A | Discharge status: Home / Self Care | Payer: 59 | Source: Ambulatory Visit | Attending: Cardiology | Admitting: Cardiology

## 2011-01-28 DIAGNOSIS — I10 Essential (primary) hypertension: Secondary | ICD-10-CM | POA: Insufficient documentation

## 2011-01-28 DIAGNOSIS — I739 Peripheral vascular disease, unspecified: Secondary | ICD-10-CM

## 2011-01-28 DIAGNOSIS — I428 Other cardiomyopathies: Secondary | ICD-10-CM

## 2011-02-10 NOTE — Procedures (Signed)
  [name], Anita Warren        ACCOUNT NO.:  [id number]  MEDICAL RECORD NO.:  [id number]          PATIENT TYPE:  LOCATION:                                 FACILITY:  PHYSICIAN:  Marca Ancona, MD      DATE OF BIRTH:  Jul 11, 1961  DATE OF PROCEDURE:  01/28/2011 DATE OF DISCHARGE:                           CARDIAC CATHETERIZATION   PROCEDURES: 1. Left heart catheterization. 2. Coronary angiography. 3. Left ventriculography. 4. Abdominal aortogram. 5. Renal artery evaluation.  INDICATIONS:  This is a 50 year old with hypertension and cardiomyopathy of uncertain etiology.  Heart catheterization today is performed to rule out coronary artery disease as the cause of cardiomyopathy and also to assess her renal artery for renal artery stenosis causing hypertension.  PROCEDURE NOTE:  After informed consent was obtained, the right groin was sterilely prepped and draped.  Lidocaine 1% was used to locally anesthetize the right groin area.  The right common femoral artery was entered using modified Seldinger technique and a 4-French arterial sheath was placed.  The left coronary artery was engaged with the JL-4 catheter.  The right coronary artery was engaged using the 3-DRC catheter.  The left ventricle was entered using angled pigtail catheter and nonselective renal artery angiogram was performed using the angled pigtail catheter.  There were no complications.  FINDINGS: 1. Hemodynamics:  LV 102/16.  Aorta 100/66. 2. Renal artery evaluation:  Abdominal aortogram was done showing     patent renal arteries bilaterally with no indication for renal     artery stenosis. 3. Left ventriculography:  EF was estimated to be 30%.  Hypokinesis     was worse towards the base than at the apex. 4. Coronary angiography:  The coronary system was right dominant.     There was no angiographic coronary artery disease.  IMPRESSION:  This is a 50 year old who is found to have a  nonischemic cardiomyopathy, ejection fraction is about 30%.  She has hypertension, but renal artery stenosis was not found.     Marca Ancona, MD     DM/MEDQ  D:  01/28/2011  T:  01/29/2011  Job:  [id number]  cc:   Hillis Range, MD Jesse Sans. Daleen Squibb, MD, [facility]  Electronically Signed by Marca Ancona MD on 02/10/2011 09:06:06 AM

## 2011-02-15 ENCOUNTER — Other Ambulatory Visit: Payer: Self-pay | Admitting: Cardiology

## 2011-02-17 ENCOUNTER — Ambulatory Visit (INDEPENDENT_AMBULATORY_CARE_PROVIDER_SITE_OTHER): Payer: 59 | Admitting: Internal Medicine

## 2011-02-17 ENCOUNTER — Encounter: Payer: Self-pay | Admitting: Internal Medicine

## 2011-02-17 ENCOUNTER — Encounter: Payer: Self-pay | Admitting: *Deleted

## 2011-02-17 DIAGNOSIS — I5022 Chronic systolic (congestive) heart failure: Secondary | ICD-10-CM

## 2011-02-17 DIAGNOSIS — I10 Essential (primary) hypertension: Secondary | ICD-10-CM

## 2011-02-17 NOTE — Assessment & Plan Note (Signed)
Improved Recent cath revealed no evidence of renal artery stenosis

## 2011-02-17 NOTE — Progress Notes (Signed)
The patient presents today for routine electrophysiology followup.  Since last being seen in our clinic, the patient reports doing very well.  Today, she denies symptoms of palpitations, chest pain, shortness of breath (above baseline), orthopnea, PND, dizziness, presyncope, syncope, or neurologic sequela.  She has dypsnea with ambulation of 100 ft or more. The patient feels that she is tolerating medications without difficulties and is otherwise without complaint today.   Past Medical History  Diagnosis Date  . 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 HTN (hypertension)   . VT (ventricular tachycardia)     nonsustained  . Hypokalemia   . Obesity    Past Surgical History  Procedure Date  . Tonselectomy   . Breast lumpectomy 1989    L breast- benign  . Tubal ligation   . Cholecystectomy     Current Outpatient Prescriptions  Medication Sig Dispense Refill  . carvedilol (COREG) 25 MG tablet Take 25 mg by mouth 2 (two) times daily with a meal.        . hydrALAZINE (APRESOLINE) 100 MG tablet Take 100 mg by mouth 3 (three) times daily.        . magnesium oxide (MAG-OX) 400 MG tablet Take 400 mg by mouth 2 (two) times daily.       Marland Kitchen ALDACTONE 25 MG tablet TAKE 1 TABLET BY MOUTH   TWICE A DAY.  60 each  11    Allergies  Allergen Reactions  . Ace Inhibitors     REACTION: angioedema  . Morphine     REACTION: vomiting    History   Social History  . Marital Status: Married    Spouse Name: N/A    Number of Children: N/A  . Years of Education: N/A   Occupational History  . Not on file.   Social History Main Topics  . Smoking status: Never Smoker   . Smokeless tobacco: Not on file  . Alcohol Use: No  . Drug Use: No  . Sexually Active: Not on file   Other Topics Concern  . Not on file   Social History Narrative   Lives in [place] with spouse.  3 grown children.  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    Family History  Problem  Relation Age of Onset  . Hypertension    . Diabetes      ROS-  All systems are reviewed and are negative except as outlined in the HPI above  Physical Exam: Filed Vitals:   02/17/11 0929  BP: 140/82  Pulse: 74  Height: 5\' 4"  (1.626 m)  Weight: 212 lb (96.163 kg)    GEN- The patient is overweight, alert and oriented x 3 today.   Head- normocephalic, atraumatic Eyes-  Sclera clear, conjunctiva pink Ears- hearing intact Oropharynx- clear Neck- supple, no JVP Lymph- no cervical lymphadenopathy Lungs- Clear to ausculation bilaterally, normal work of breathing Heart- Regular rate and rhythm, no murmurs, rubs  +S4, PMI not laterally displaced GI- soft, NT, ND, + BS Extremities- no clubbing, cyanosis, trace edema MS- no significant deformity or atrophy Skin- no rash or lesion Psych- euthymic mood, full affect Neuro- strength and sensation are intact  EKG- sinus rhythm 74 bpm, PR 174, QRS 96, Qtc 481, LVH  Assessment and Plan:

## 2011-02-17 NOTE — Assessment & Plan Note (Signed)
The patient has a nonischemic CM (EF 30%), NYHA Class II/III CHF, and HTN.  Recent cath revealed no CAD.   At this time, she meets SCD-HeFT criteria for ICD implantation for primary prevention of sudden death.  Risks, benefits, alternatives to ICD implantation were discussed in detail with the patient today. The patient  understands that the risks include but are not limited to bleeding, infection, pneumothorax, perforation, tamponade, vascular damage, renal failure, MI, stroke, death, inappropriate shocks, and lead dislodgement and wishes to proceed.  Her concern is that her insurance may not cover the procedure.  She wishes to check with her insurance and will contact our office when she wishes to proceed.  She will follow-up with Dr Daleen Squibb for treatment of her CHF in the interim.

## 2011-02-17 NOTE — Patient Instructions (Signed)

## 2011-02-25 HISTORY — PX: CARDIAC DEFIBRILLATOR PLACEMENT: SHX171

## 2011-02-28 ENCOUNTER — Other Ambulatory Visit (INDEPENDENT_AMBULATORY_CARE_PROVIDER_SITE_OTHER): Payer: 59 | Admitting: *Deleted

## 2011-02-28 DIAGNOSIS — I5022 Chronic systolic (congestive) heart failure: Secondary | ICD-10-CM

## 2011-02-28 DIAGNOSIS — I10 Essential (primary) hypertension: Secondary | ICD-10-CM

## 2011-02-28 DIAGNOSIS — Z01810 Encounter for preprocedural cardiovascular examination: Secondary | ICD-10-CM

## 2011-02-28 LAB — CBC WITH DIFFERENTIAL/PLATELET
Basophils Absolute: 0 10*3/uL (ref 0.0–0.1)
Eosinophils Absolute: 0.3 10*3/uL (ref 0.0–0.7)
HCT: 32.8 % — ABNORMAL LOW (ref 36.0–46.0)
Hemoglobin: 11.1 g/dL — ABNORMAL LOW (ref 12.0–15.0)
Lymphs Abs: 2.9 10*3/uL (ref 0.7–4.0)
MCHC: 34 g/dL (ref 30.0–36.0)
MCV: 93.9 fl (ref 78.0–100.0)
Monocytes Absolute: 0.7 10*3/uL (ref 0.1–1.0)
Neutro Abs: 4.3 10*3/uL (ref 1.4–7.7)
Platelets: 163 10*3/uL (ref 150.0–400.0)
RDW: 14.7 % — ABNORMAL HIGH (ref 11.5–14.6)

## 2011-02-28 LAB — BASIC METABOLIC PANEL
BUN: 15 mg/dL (ref 6–23)
CO2: 24 mEq/L (ref 19–32)
GFR: 83.23 mL/min (ref >60.00)
Glucose, Bld: 109 mg/dL — ABNORMAL HIGH (ref 70–99)
Potassium: 3.4 mEq/L — ABNORMAL LOW (ref 3.5–5.1)
Sodium: 138 mEq/L (ref 135–145)

## 2011-02-28 LAB — PROTIME-INR: Prothrombin Time: 12.3 s (ref 10.2–12.4)

## 2011-03-07 ENCOUNTER — Ambulatory Visit (HOSPITAL_COMMUNITY)
Admission: RE | Admit: 2011-03-07 | Discharge: 2011-03-08 | Disposition: A | Discharge status: Home / Self Care | Payer: 59 | Source: Ambulatory Visit | Attending: Internal Medicine | Admitting: Internal Medicine

## 2011-03-07 DIAGNOSIS — I472 Ventricular tachycardia, unspecified: Secondary | ICD-10-CM | POA: Insufficient documentation

## 2011-03-07 DIAGNOSIS — I4729 Other ventricular tachycardia: Secondary | ICD-10-CM | POA: Insufficient documentation

## 2011-03-07 DIAGNOSIS — I509 Heart failure, unspecified: Secondary | ICD-10-CM | POA: Insufficient documentation

## 2011-03-07 DIAGNOSIS — I10 Essential (primary) hypertension: Secondary | ICD-10-CM | POA: Insufficient documentation

## 2011-03-07 DIAGNOSIS — I428 Other cardiomyopathies: Secondary | ICD-10-CM | POA: Insufficient documentation

## 2011-03-07 DIAGNOSIS — I5022 Chronic systolic (congestive) heart failure: Secondary | ICD-10-CM | POA: Insufficient documentation

## 2011-03-07 LAB — BASIC METABOLIC PANEL
BUN: 17 mg/dL (ref 6–23)
CO2: 25 mEq/L (ref 19–32)
Calcium: 9.4 mg/dL (ref 8.4–10.5)
Creatinine, Ser: 0.74 mg/dL (ref 0.4–1.2)
GFR calc non Af Amer: >60 mL/min (ref >60)
Glucose, Bld: 123 mg/dL — ABNORMAL HIGH (ref 70–99)
Sodium: 139 mEq/L (ref 135–145)

## 2011-03-08 ENCOUNTER — Ambulatory Visit (HOSPITAL_COMMUNITY): Payer: 59

## 2011-03-08 LAB — BASIC METABOLIC PANEL
CO2: 28 mEq/L (ref 19–32)
Chloride: 102 mEq/L (ref 96–112)
Creatinine, Ser: 0.85 mg/dL (ref 0.4–1.2)
GFR calc Af Amer: >60 mL/min (ref >60)
Glucose, Bld: 135 mg/dL — ABNORMAL HIGH (ref 70–99)

## 2011-03-14 NOTE — Consult Note (Signed)
Anita Warren, Anita Warren        ACCOUNT NO.:  [id number]   MEDICAL RECORD NO.:  [id number]          PATIENT TYPE:  INP   LOCATION:  A219                          FACILITY:  [facility]   PHYSICIAN:  Kassie Mends, M.D.      DATE OF BIRTH:  September 08, 1961   DATE OF CONSULTATION:  05/19/2006  DATE OF DISCHARGE:                                   CONSULTATION   REASON FOR CONSULTATION:  Painful swallowing.   HISTORY OF PRESENT ILLNESS:  [name] is a 50 year old female who had  itching yesterday and took two diphenhydramine capsules.  She felt like they  initially got stuck and she drank some water.  She then went to bed.  She  woke up around 4 p.m. with sensation as if something was stuck in her upper  esophagus.  She also has pain with swallowing.  She tried to swallow bread  but was unable to do that.  She tried for approximately 6 hours to relieve  the sensation of something stuck in her esophagus and then finally came to  the emergency department.  She was able to drink liquids but may regurgitate  a small quantity.  She is unable to swallow solid food.  She only has pain  in her chest with swallowing.  She denies any shortness of breath.   PAST MEDICAL HISTORY:  Hypertension.   PAST SURGICAL HISTORY:  1.  C-section.  2.  Hysterectomy.  3.  Tonsillectomy.   ALLERGIES:  1.  DEMEROL.  2.  PEANUTS.  3.  SUNFLOWER SEEDS.   MEDICATIONS:  1.  Norvasc.  2.  Hydrochlorothiazide.  3.  Xanax.   SOCIAL HISTORY:  She is married and does not smoke or drink.  She works in  the emergency department.   REVIEW OF SYSTEMS:  She denies vomiting, weight loss, heartburn, indigestion  or any other history of food impaction.  Her review of systems is per the  HPI.  Otherwise, all systems are negative.   PHYSICAL EXAM:  Temperature 98.8, blood pressure 123/79, pulse 83.  GENERAL:  She is in no apparent distress.  Alert and oriented x4.  HEENT:  Exam atraumatic, normocephalic.  Pupils equal and  reactive to light.  Mouth:  No oral lesions.  Posterior pharynx without erythema or exudate.  NECK:  Full range of motion and no lymphadenopathy. LUNGS:  Clear to  auscultation bilaterally. CARDIOVASCULAR:  Regular rhythm, no murmur, normal  S1 and S2.  ABDOMEN:  Bowel sounds are present, soft, nontender,  nondistended, no rebound or guarding. EXTREMITIES:  Without clubbing,  cyanosis or edema.  She has no focal neurologic deficits.   ASSESSMENT:  [name] is a 50 year old female with a foreign body  sensation in her esophagus.  There is a low likelihood that she has a food  impaction or esophageal stricture causing her sensation.  The most likely  etiology for her symptoms is pill esophagitis.  Thank you for allowing me to  see [name] in consultation.  My recommendations follow.   RECOMMENDATIONS:  1.  Obtain Gastrografin swallow this morning.  Will be happy to perform EGD  if evidence of obstruction.  We will use fentanyl instead of Demerol      because she has an allergy to that medicine.  2.  Pain control.  3.  Nothing by mouth except ice chips and recommend changing hypertensive      medicines to IV.      Kassie Mends, M.D.  Electronically Signed     SM/MEDQ  D:  05/19/2006  T:  05/19/2006  Job:  [id number]

## 2011-03-14 NOTE — Discharge Summary (Signed)
Anita Warren, Anita Warren        ACCOUNT NO.:  [id number]   MEDICAL RECORD NO.:  [id number]          PATIENT TYPE:  INP   LOCATION:  A219                          FACILITY:  [facility]   PHYSICIAN:  Madelin Rear. Sherwood Gambler, MD  DATE OF BIRTH:  1960/12/13   DATE OF ADMISSION:  05/18/2006  DATE OF DISCHARGE:  07/25/2007LH                                 DISCHARGE SUMMARY   DISCHARGE DIAGNOSES:  1.  Dysphagia.  2.  Hypertension, well controlled.   DISCHARGE MEDICATIONS:  1.  Norvasc 10 mg q.d.  2.  Hydrochlorothiazide 12.5 by mouth q.d.   SUMMARY:  The patient was admitted because of a subjective sensation of food  stuck in the hypopharynx laryngeal area.  She had continuous salivary  spitting, etc. with the feeling of prompt regurgitation of oral intake after  attempts in the emergency department with glucagon and nitroglycerin.  She  was brought in and maintained on parenteral hydration.  GI saw her for  question endoscopy and evaluation of possible stricture.   Gastrografin studies were negative and the patient vomited after  Gastrografin.  She felt well and is taking soft diet and was discharged  symptomatic.  Follow-up office p.r.n.      Madelin Rear. Sherwood Gambler, MD  Electronically Signed     LJF/MEDQ  D:  05/20/2006  T:  05/20/2006  Job:  [id number]

## 2011-03-14 NOTE — Op Note (Signed)
[facility]  Patient:    Anita Warren, Anita Warren                 MRN: [id number] Proc. Date: 08/20/00 Adm. Date:  [id number] Attending:  Cordelia Pen Ii                           Operative Report  PREOPERATIVE DIAGNOSIS:       Uterine leiomyomata.  POSTOPERATIVE DIAGNOSIS:      Uterine leiomyomata.  OPERATION:                    Laparoscopically-assisted vaginal hysterectomy.  SURGEON:                      Guy Sandifer. Arleta Creek, M.D.  ASSISTANT:                    ________, M.D.  ANESTHESIA:                   General with endotracheal intubation - Gretta Cool., M.D.  ESTIMATED BLOOD LOSS:         400 cc.  INDICATIONS AND CONSENT:      The patient is a 50 year old divorced black female, gravida 2, para 3, status post tubal ligation with known leiomyomata. She is status post cesarean section x 2.  No vaginal deliveries.  Options have been discussed with the patient in detail and is dictated in the History and Physical.  Laparoscopically-assisted vaginal hysterectomy was discussed. Removal of an ovary is discussed only if the _______ distinctly abnormal. Potential complications are discussed including, but not limited to infection, bowel, bladder and urethral damage, bleeding requiring transfusion of blood products, and possible transfusion reaction such as HIV, hepatitis, ______, DVT, PE, pneumonia, and fistula formation.  All questions are answered and consent signed on the chart.  FINDINGS:                     The upper abdomen is normal.  The appendix is normal.  The uterus is approximately 10 weeks in size with multiple subserosal leiomyomatas.  Fallopian tubes are status post ligation bilaterally.  Ovaries are normal bilaterally.  Posterior cul-de-sac contains some filmy adhesive disease.  The anterior cul-de-sac is normal.  DESCRIPTION OF PROCEDURE:     The patient was taken to the operating room  and placed in the dorsal supine position.  General anesthesia is induced via endotracheal intubation.  She was then placed in the dorsolithotomy position where she was prepped abdominally and vaginally.  The bladder was straight catheterized and a Hulka tenaculum is placed in the uterus as a manipulator. She is draped in a sterile fashion.  A small infraumbilical incision is made and a 12 mm disposable trocar sleeve was placed without difficulty.  Placement was verified with the laparoscope and no damage to the surrounding structures is noted.  Pneumoperitoneum is induced and then a small suprapubic and later left and right lower quadrant incisions were made after careful transillumination, and 5 mm nondisposable trocars sleeves are placed under direct visualization without difficulty.  The above findings were noted.  The filmy adhesions in the posterior cul-de-sac were taken down sharply and bluntly without difficulty.  The cords and the ureters were carefully identified bilaterally.  Then, using the 5 mm laparoscope through the left and then right lower quadrant trocar sleeves, the disposable stapler with a wide cartridge was used to take down the proximal ligaments bilaterally. Reinspection with the operative laparoscope revealed good hemostasis.  The vesicouterine peritoneum was incised in the midline and it was hydrodissected, and then it was taken down bilaterally with the lower uterine segment.  Good hemostasis was noted.  Instruments are removed and attention is turned to the vagina.                                The posterior cul-de-sac was entered sharply without difficulty.  The cervix was circumscribed with the scalpel.  The mucosa is advanced bluntly and sharply.  The uterosacral ligaments were then taken bilaterally and ligated with transfixion sutures of 0 Monocryl.  All suture will be 0 Monocryl unless otherwise designated.  The bladder pillars followed by the cardinal  ligaments followed by the uterine vessels were taken bilaterally.  Two bites above the level of the uterine vessels were taken bilaterally.  The fundus was then delivered posteriorly.  The proximal ligaments were clamped and cut, and the specimen was delivered.  The pedicles were then ligated with a free tie, and then a suture on the left pedicle, and a free tie on the right pedicle.  Good hemostasis is noted.  The uterosacral ligaments were then plicated to the vagina bilaterally.  The uterosacral ligaments were then plicated in the midline with a single suture.  The cuff is closed with figure-of-eight sutures.  A Foley catheter is placed and clear urine is noted.  Reinspection with the laparoscope reveals excellent hemostasis.  Excess irrigation is removed.  Inferior trocar sleeves are removed and pneumoperitoneum was reduced, and no bleeding is noted from any site.  The pneumoperitoneum was completely reduced and the umbilical trocar sleeve was removed, and the umbilical incision was closed first with a 0 Vicryl suture on the deep underlying tissues with care being taken not to pit up any underlying structures.  The skin incision was then closed with a subcuticular 3-0 Vicryl suture.  The skin incisions were then injected with 0.5% plain Marcaine.  Dressings were applied.                                All counts were correct.  The patient was awakened and taken to the recovery room in stable condition. DD:  08/20/00 TD:  08/20/00 Job: [id number]

## 2011-03-14 NOTE — H&P (Signed)
Anita Warren, Anita Warren        ACCOUNT NO.:  [id number]   MEDICAL RECORD NO.:  [id number]          PATIENT TYPE:  INP   LOCATION:  A219                          FACILITY:  [facility]   PHYSICIAN:  Madelin Rear. Sherwood Gambler, MD  DATE OF BIRTH:  1961/09/06   DATE OF ADMISSION:  05/18/2006  DATE OF DISCHARGE:  LH                                HISTORY & PHYSICAL   CHIEF COMPLAINT:  Dysphagia.   HISTORY OF PRESENT ILLNESS:  Around afternoon 24 hours prior to my  evaluation the patient developed after swallowing pills the sensation of  getting stuck in the hypopharyngeal/laryngeal area.  She denied any fever,  chills, or aspiration.  She was initially able to pass liquids; however,  this progressed to the point where she could not swallow anything.  She  presented to the emergency department early a.m. for this.  She has a past  medical history of hypertension.   SOCIAL HISTORY:  Nonsmoker, nondrinker.  No drug use.  Works here in the  hospital.   FAMILY HISTORY:  Noncontributory.   REVIEW OF SYSTEMS:  As under HPI.  Specifically, she has never had any  previous problems with this.   PHYSICAL EXAMINATION:  GENERAL:  She is awake, alert, cooperative, although  there is an emesis basin full of Kleenex full of saliva due to difficulty  swallowing.  She has no strider and her voice is not hoarse.  HEENT:  No JVD or adenopathy.  NECK:  Supple.  CHEST:  Clear.  CARDIAC:  Regular rate and rhythm without murmurs, rubs, or gallops.  ABDOMEN:  Benign.  NEUROLOGIC:  Normal.  EXTREMITIES:  Normal.   IMPRESSION:  1.  Obvious esophageal obstruction.  We will specifically not do any imaging      as prefer to have direct EGD visualization and removal of obstruction      and appropriate intervention as indicated to the endoscopist.  2.  Hypertension.  p.r.n. coverage.  Expectant observation.      Madelin Rear. Sherwood Gambler, MD  Electronically Signed    LJF/MEDQ  D:  05/19/2006  T:  05/19/2006  Job:   [id number]

## 2011-03-14 NOTE — H&P (Signed)
[facility]  Patient:    Anita Warren, Anita Warren                   MRN: [id number] Adm. Date:  08/20/00 Attending:  Guy Sandifer. Arleta Creek, M.D.                         History and Physical  CHIEF COMPLAINT:              Uterine leiomyomata and menorrhagia.  HISTORY OF PRESENT ILLNESS:   The patient is a 50 year old divorced black female, G2, P3, status post tubal ligation who has known leiomyomata.  She has increasingly heavy bleeding that has been happening for several years. Attempts with control with the birth control pill as well as supplemental estrogens have been no help.  She saturates a tampon plus a pad every 20 minutes, sometimes for four days at a time.  She sometimes has to leave work because of the heavy bleeding.  After a discussion of the options, she is being admitted for a laparoscopically assisted vaginal hysterectomy with removal of an ovary only if distinctly abnormal.  PAST MEDICAL HISTORY:         1. Patient had twins.                               2. Chronic hypertension.                               3. History of kidney stones.                               4. History of bladder infections.                               5. Superficial varicosities, bilateral lower                                  extremities.                               6. History of anemia.                               7. Mononucleosis, 1988.  PAST SURGICAL HISTORY:        1. Lumpectomy, left breast, benign, 1990.                               2. Tubal ligation, 1988.                               3. Tonsillectomy, 1999.                               4. Cholecystectomy, 1996.  OBSTETRIC HISTORY:            Cesarean section x 2.  FAMILY HISTORY:  Type 2 diabetes in mother, chronic hypertension in mother and brother, coronary artery disease in mother, epilepsy in brother.  MEDICATIONS:                  Monopril daily.  ALLERGIES:                     No known drug allergies.  She is allergic to peanuts and sunflower seeds which can lead to anaphylaxis.  SOCIAL HISTORY:               Denies tobacco alcohol, or drug abuse.  REVIEW OF SYSTEMS:            Negative except as above.  PHYSICAL EXAMINATION:  VITAL SIGNS:                  Height 5 feet, 3-1/2 inches, weight 167 pounds, blood pressure 120/92.  HEENT:                        Without thyromegaly.  LUNGS:                        Clear to auscultation.  HEART:                        Regular rate and rhythm.  BACK:                         Without CVA tenderness.  BREASTS:                      Without mass, retraction, discharge.  ABDOMEN:                      Soft, nontender, without mass.  PELVIC:                       Vulva, vagina, cervix without lesion.  Uterus anteverted, 8 to 10 weeks in size, irregular contour, consistent with leiomyomata.  Uterus is mildly tender, mobile, with perhaps a first-degree prolapse at best.  Adnexa nontender without masses.  RECTAL:                       Exam without masses.  EXTREMITIES/NEUROLOGICAL:     Exam within normal limits.  LABORATORY DATA:              Pap smear is benign.                                Mammogram on June 05, 2000 is negative.  ASSESSMENT:                   Uterine leiomyomata and secondary menometrorrhagia.  PLAN:                         Laparoscopically assisted vaginal hysterectomy and removal of an ovary only if abnormal. DD:  08/06/00 TD:  08/06/00 Job: [id number]

## 2011-03-14 NOTE — Discharge Summary (Signed)
[facility]  Patient:    Anita Warren, Anita Warren                 MRN: [id number] Adm. Date:  [id number] Disc. Date: 08/21/00 Attending:  Cordelia Pen Ii                           Discharge Summary  ADMISSION DIAGNOSIS:          Uterine leiomyomata.  DISCHARGE DIAGNOSIS:          Uterine leiomyomata.  PROCEDURES:                   On August 20, 2000, laparoscopically-assisted vaginal hysterectomy.  REASON FOR ADMISSION:         This patient is a 50 year old, divorced, black female, G2, P3, status post tubal ligation with uterine leiomyomata becoming increasing symptomatic.  She is admitted for surgical treatment.  HOSPITAL COURSE:              She is admitted to the hospital and undergoes the above procedure without complication.  The estimated blood loss is 400 cc. On the evening of surgery, she had some nausea and vomiting with the morphine. Urine output is clear.  She remains afebrile and the abdomen is soft.  On the day of discharge, she is feeling much better.  The pain medication was changed to Dilaudid, which gave much better results.  She remains afebrile.  She is ambulating well.  On August 21, 2000, the white count is 14.3, hemoglobin 11.4, and platelet count 199,000.  The abdomen is soft and incisions are healing well.  The patients diet will be advanced to a regular diet for lunch.  If she tolerates this, she will be discharged home.  CONDITION ON DISCHARGE:       Good.  DIET:                         Regular as tolerated.  ACTIVITY:                     No lifting.  No operation of automobiles.  No vaginal entry.  SPECIAL INSTRUCTIONS:         She is to call the office for problems, including, but not limited to a temperature over 100 degrees, heavy vaginal bleeding, persistent nausea and vomiting, or increasing pain.  DISCHARGE MEDICATIONS:        1. Tylox, #30, one to two q.6h. p.r.n.                               2.  Ibuprofen 800 mg p.o. q.8h. p.r.n.                               3. Multivitamins one daily.                               4. Norvasc 5 mg one p.o. q.d.  FOLLOW-UP:                    Follow-up is in the office in two weeks. DD:  08/21/00 TD:  08/21/00 Job: [id number]

## 2011-03-17 NOTE — Discharge Summary (Addendum)
Anita Warren, Anita Warren        ACCOUNT NO.:  [id number]  MEDICAL RECORD NO.:  [id number]           PATIENT TYPE:  O  LOCATION:  2005                         FACILITY:  [facility]  PHYSICIAN:  Vesta Mixer, M.D. DATE OF BIRTH:  1960/12/14  DATE OF ADMISSION:  03/07/2011 DATE OF DISCHARGE:  03/08/2011                              DISCHARGE SUMMARY   DISCHARGE DIAGNOSES: 1. 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 Jude Medical     Fortify VR implantable cardioverter-defibrillator on Mar 07, 2011.     a.     History of nonsustained ventricular tachycardia.     b.     Cardiomyopathy is nonischemic, given no evidence of coronary      artery disease by catheterization on January 30, 2011.     c.     Ejection fraction was 15% by 2-D echocardiogram on October 07, 2010. 2. Hypertension. 3. Hypokalemia, repleted. 4. History of angioedema with angiotensin-converting enzyme     inhibitors, not on angiotensin-converting enzyme or angiotensin     receptor blocker because of this.  HOSPITAL COURSE:  [name] is a 50 year old female with a history of severe dilated cardiomyopathy with an EF of 10-15% by echo on October 07, 2010, who underwent cardiac catheterization in April 2012. She was found to have a nonischemic cardiomyopathy given no angiographic evidence of CAD.  She was subsequently referred to Dr. Johney Frame for evaluation who felt that she met SCD-HeFT criteria for ICD implantation with primary prevention of sudden cardiac death.  She underwent implantation on Mar 07, 2011, by Dr. Johney Frame with a St. Jude Medical Fortify VR ICD device.  The patient tolerated the procedure well.  Chest x-ray this morning was negative for pneumothorax or other apparent complications.  She did have a potassium level of 3.3, which will be repleted prior to discharge.  Dr. Elease Hashimoto has seen and examined her today and feels  she is stable for discharge.  DISCHARGE LABORATORY DATA:  Sodium 135, potassium 3.3, chloride 102, CO2 of 28, glucose 135, BUN 14, and creatinine 0.8.  STUDIES: 1. Chest x-ray on Mar 08, 2011, showed AICD placement without     pneumothorax or apparent complications. 2. Defibrillator implantation on Mar 07, 2011, with St. Jude device,     see full report for details.  DISCHARGE MEDICATIONS: 1. Hydralazine 100 mg t.i.d. 2. Albuterol inhaler 1-2 puffs inhaled q.4 h. p.r.n. shortness of     breath. 3. Coreg 25 mg t.i.d. 4. CoQ10 50 mg 1 capsule daily. 5. Mag-Ox 400 mg daily. 6. Spironolactone 25 mg b.i.d.  DISPOSITION:  [name] will be discharged in stable condition to home.  She was given a copy of the supplemental pacemaker/defibrillator discharge instructions with exquisite instructions on activity, wound care, and bathing.  She will follow up in our Device Clinic in approximately 7-10 days, have her follow up with Dr. Daleen Squibb in approximately 2 weeks, then follow up with Dr. Johney Frame in 3 months to follow her ICD.  Our office will call her with these appointments.  DURATION  OF DISCHARGE ENCOUNTER:  Greater than 30 minutes including physician and PA time.     Ronie Spies, P.A.C.   ______________________________ Vesta Mixer, M.D.    DD/MEDQ  D:  03/08/2011  T:  03/08/2011  Job:  [id number]  cc:   Thomas C. Daleen Squibb, MD, [facility] Hillis Range, MD  Electronically Signed by Ronie Spies  on 03/17/2011 01:38:25 PM Electronically Signed by Kristeen Miss M.D. on 03/28/2011 04:46:27 PM

## 2011-03-21 ENCOUNTER — Ambulatory Visit (INDEPENDENT_AMBULATORY_CARE_PROVIDER_SITE_OTHER): Payer: 59 | Admitting: *Deleted

## 2011-03-21 DIAGNOSIS — I472 Ventricular tachycardia: Secondary | ICD-10-CM

## 2011-03-21 DIAGNOSIS — I5022 Chronic systolic (congestive) heart failure: Secondary | ICD-10-CM

## 2011-03-21 DIAGNOSIS — I429 Cardiomyopathy, unspecified: Secondary | ICD-10-CM

## 2011-03-21 NOTE — Progress Notes (Signed)
Wound check-icd  

## 2011-03-21 NOTE — Op Note (Signed)
NAMESALIMATA, CHRISTENSON        ACCOUNT NO.:  [id number]  MEDICAL RECORD NO.:  [id number]           PATIENT TYPE:  O  LOCATION:  2005                         FACILITY:  [facility]  PHYSICIAN:  Hillis Range, MD       DATE OF BIRTH:  Jul 15, 1961  DATE OF PROCEDURE:  03/07/2011 DATE OF DISCHARGE:                              OPERATIVE REPORT   SURGEON:  Hillis Range, MD  PREPROCEDURE DIAGNOSES: 1. Idiopathic cardiomyopathy. 2. Chronic systolic dysfunction. 3. Nonsustained ventricular tachycardia.  POSTPROCEDURE DIAGNOSES: 1. Idiopathic cardiomyopathy. 2. Chronic systolic dysfunction. 3. Nonsustained ventricular tachycardia.  PROCEDURE:  Defibrillator implantation with defibrillation threshold testing.  INTRODUCTION:  Ms. 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 She has been treated with an optimal medical regimen but has persistently depressed ejection fraction.  She meets SCD-HeFT criteria for ICD implantation for primary prevention of sudden cardiac death and therefore presents today for ICD implantation.  DESCRIPTION OF PROCEDURE:  Informed written consent was obtained and the patient was brought to the electrophysiology lab in the fasting state. She was adequately sedated with intravenous Versed and fentanyl as outlined in the nursing report.  The patient's left chest was prepped and draped in the usual sterile fashion by the EP lab staff.  The skin overlying the left deltopectoral region was infiltrated with lidocaine for local analgesia.  A 4-cm incision was made over the left deltopectoral region.  A left subcutaneous defibrillator pocket was fashioned using a combination of sharp and blunt dissection. Electrocautery was required to assure hemostasis.   The left axillary vein was directly cannulated.  No contrast was required for this endeavor.  Through the left axillary vein, a St. Jude Medical Conrad, model 91478-29 lead was advanced with fluoroscopic visualization into the right ventricular apex position.  The SVC coil was noted to be in the subclavian vein predominantly.  R-waves measured 9 mV with impedance of 700 ohms and a threshold of 1.0 volts at 0.5 milliseconds.  The lead was secured to the pectoralis fascia and connected to a [employer] VR model [phone]) ICD.  The pocket was irrigated with copious gentamicin solution.  The defibrillator was then placed into the pocket.  Defibrillation threshold testing was then performed.  Ventricular fibrillation was induced with a T-wave shock. Adequate sensing of ventricular fibrillation was observed, however, a 15- joule and a 25-joule shock delivered through the device, were both unsuccessful in defibrillating the patient and a 360-joule biphasic external rescue shock was required.  After a 4-minute recovery phase, the patient's defibrillation pulse width was changed to 3.5 milliseconds/3.5 milliseconds based on impedance recommendations from the device.  Defibrillation threshold was again performed.  Ventricular fibrillation was induced with a T-wave shock and at this point, a 24- joule and a 35-joule shock were both unsuccessful in defibrillating the patient and a 360-joule biphasic external shock was required to defibrillate the patient.  After another 4-minute recovery phase, the SVC coil was excluded and a defibrillation fixed pulse width  of 4.0 milliseconds and 3.0 milliseconds was recommended and programmed. Ventricular fibrillation was induced with a T-wave shock and a 27-joule and a 34-joule shock were both unsuccessful in defibrillating the patient and she again required a 360-joule biphasic external rescue shock.  At this point, I elected to  replace the lead as I felt that the SVC coil was too proximal within the subclavian vein.  The device was therefore removed from the pocket and disconnected from the lead.  The Durata model [phone] lead was removed and replaced with a St. Jude Medical Durata model [phone] cm lead (serial [id number]).  This lead was advanced through the left axillary vein into the right ventricular apex position with fluoroscopic visualization.  Again, no contrast was required.  This lead was placed into the right ventricular apex and at this point, the SVC coil was noted to be at the level of the right atrium and SVC.  R-waves measured 6.8 mV with impedance of 600 ohms and a threshold of 0.7 volts at 0.5 milliseconds.  The lead was therefore secured to the pectoralis fascia using #2 silk suture over the suture sleeve.  The lead was then connected to the [facility] VR model [id number] [phone]) defibrillator.  The pocket was again irrigated with copious gentamicin solution.  The defibrillator was then placed into the pocket.  The pocket was then closed in 2 layers with 2.0 Vicryl suture for the subcutaneous and subcuticular layers.  Steri- Strips and sterile dressing were then applied.  Ventricular defibrillation threshold testing was again performed.  Ventricular fibrillation was induced with a T-wave shock.  Adequate sensing of ventricular fibrillation was observed with no dropout with a programmed sensitivity of 1.0 mV.  A single 25-joule shock delivered from the device was successful in defibrillating the patient.  The impedance was 33 ohms with a duration of 5.5 seconds.  The patient remained in sinus rhythm thereafter.  This lead has a 17-cm intra-coil spacing and a 65% tilt was used for the shock.  The procedure was therefore considered completed.  There were no early apparent complications.  CONCLUSION: 1. Successful implantation of a [employer] VR ICD for      primary prevention of sudden cardiac death. 2. DFT less than or equal to 25 joules. 3. No early apparent complications.     Hillis Range, MD     JA/MEDQ  D:  03/07/2011  T:  03/07/2011  Job:  [id number]  cc:   Thomas C. Daleen Squibb, MD, [facility] Madelin Rear. Sherwood Gambler, MD  Electronically Signed by Hillis Range MD on 03/21/2011 05:42:22 PM

## 2011-03-25 ENCOUNTER — Encounter: Payer: Self-pay | Admitting: Cardiology

## 2011-03-26 ENCOUNTER — Encounter: Payer: 59 | Admitting: Cardiology

## 2011-04-04 ENCOUNTER — Encounter: Payer: Self-pay | Admitting: Cardiology

## 2011-04-04 ENCOUNTER — Ambulatory Visit (INDEPENDENT_AMBULATORY_CARE_PROVIDER_SITE_OTHER): Payer: Self-pay | Admitting: Cardiology

## 2011-04-04 VITALS — BP 142/86 | HR 81 | Ht 64.0 in | Wt 213.0 lb

## 2011-04-04 DIAGNOSIS — I5022 Chronic systolic (congestive) heart failure: Secondary | ICD-10-CM

## 2011-04-04 DIAGNOSIS — I429 Cardiomyopathy, unspecified: Secondary | ICD-10-CM

## 2011-04-04 DIAGNOSIS — I10 Essential (primary) hypertension: Secondary | ICD-10-CM

## 2011-04-04 NOTE — Assessment & Plan Note (Signed)
Improved. No change in medication

## 2011-04-04 NOTE — Progress Notes (Signed)
HPI Anita Warren returns today for her nonischemic cardiomyopathy. She had her blood planning by Dr. Johney Frame on May 11. Cardiac catheterization showed normal arteries. Her ejection fraction is around 25% by 2-D echocardiogram in March which was improved from 15% in December 2011. Cardiac catheterization suggested in the range of 30%.It has improved.    She is now disabled.  Medications reviewed. She's having some photosensitivity most likely from hydralazine. Past Medical History  Diagnosis Date  . 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 HTN (hypertension)   . VT (ventricular tachycardia)     nonsustained  . Hypokalemia   . Obesity   . Pericardial effusion   . Diastolic dysfunction     grade 1    Past Surgical History  Procedure Date  . Tonselectomy   . Breast lumpectomy 1989    L breast- benign  . Tubal ligation   . Cholecystectomy     Family History  Problem Relation Age of Onset  . Hypertension    . Diabetes      History   Social History  . Marital Status: Married    Spouse Name: N/A    Number of Children: N/A  . Years of Education: N/A   Occupational History  . Not on file.   Social History Main Topics  . Smoking status: Never Smoker   . Smokeless tobacco: Not on file  . Alcohol Use: No  . Drug Use: No  . Sexually Active: Not on file   Other Topics Concern  . Not on file   Social History Narrative   Lives in [place] with [place].  3 grown children.  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    Allergies  Allergen Reactions  . Ace Inhibitors     REACTION: angioedema  . Demerol   . Morphine     REACTION: vomiting    Current Outpatient Prescriptions  Medication Sig Dispense Refill  . ALDACTONE 25 MG tablet TAKE 1 TABLET BY MOUTH   TWICE A DAY.  60 each  11  . carvedilol (COREG) 25 MG tablet Take 25 mg by mouth 2 (two) times daily with a meal.        . Coenzyme Q10 (CO Q 10 PO) Take 1 capsule by mouth daily.         . hydrALAZINE (APRESOLINE) 100 MG tablet Take 100 mg by mouth 3 (three) times daily.        . magnesium oxide (MAG-OX) 400 MG tablet Take 400 mg by mouth 2 (two) times daily.         ROS Negative other than HPI.   PE General Appearance: well developed, well nourished in no acute distress, obese HEENT: symmetrical face, PERRLA, good dentition  Neck: no JVD, thyromegaly, or adenopathy, trachea midline Chest: symmetric without deformity Cardiac: PMI non-displaced, RRR, normal S1, S2, no gallop or murmur Lung: clear to ausculation and percussion Vascular: all pulses full without bruits  Abdominal: nondistended, nontender, good bowel sounds, no HSM, no bruits Extremities: no cyanosis, clubbing or edema, no sign of DVT, no varicosities  Skin: normal color, no rashes Neuro: alert and oriented x 3, non-focal Pysch: normal affect Filed Vitals:   04/04/11 1446  BP: 142/86  Pulse: 81  Height: 5\' 4"  (1.626 m)  Weight: 213 lb (96.616 kg)    EKG  Labs and Studies Reviewed.   Lab Results  Component Value Date   WBC 8.4 02/28/2011  HGB 11.1* 02/28/2011   HCT 32.8* 02/28/2011   MCV 93.9 02/28/2011   PLT 163.0 02/28/2011      Chemistry      Component Value Date/Time   NA 135 03/08/2011 0340   K 3.3* 03/08/2011 0340   CL 102 03/08/2011 0340   CO2 28 03/08/2011 0340   BUN 14 03/08/2011 0340   CREATININE 0.85 03/08/2011 0340      Component Value Date/Time   CALCIUM 9.1 03/08/2011 0340   ALKPHOS 33* 10/07/2010 1400   AST 52* 10/07/2010 1400   ALT 122* 10/07/2010 1400   BILITOT 0.8 10/07/2010 1400       No results found for this basename: CHOL   No results found for this basename: HDL   No results found for this basename: LDLCALC   No results found for this basename: TRIG   No results found for this basename: CHOLHDL   No results found for this basename: HGBA1C   Lab Results  Component Value Date   ALT 122* 10/07/2010   AST 52* 10/07/2010   ALKPHOS 33* 10/07/2010    BILITOT 0.8 10/07/2010   Lab Results  Component Value Date   TSH 0.957 10/07/2010

## 2011-04-04 NOTE — Assessment & Plan Note (Signed)
Stable. No change in medications. Return for followup in 3 months. Urged to lose another 12-15 pounds. Encouraged her Lunabelle Oatley 3 hours per week.

## 2011-04-04 NOTE — Patient Instructions (Signed)
Keep follow up appts as scheduled.

## 2011-04-25 ENCOUNTER — Encounter: Payer: Self-pay | Admitting: Internal Medicine

## 2011-04-25 ENCOUNTER — Ambulatory Visit (INDEPENDENT_AMBULATORY_CARE_PROVIDER_SITE_OTHER): Payer: Self-pay | Admitting: Internal Medicine

## 2011-04-25 DIAGNOSIS — I472 Ventricular tachycardia: Secondary | ICD-10-CM

## 2011-04-25 DIAGNOSIS — I5022 Chronic systolic (congestive) heart failure: Secondary | ICD-10-CM

## 2011-04-25 DIAGNOSIS — I10 Essential (primary) hypertension: Secondary | ICD-10-CM

## 2011-04-25 LAB — ICD DEVICE OBSERVATION
CHARGE TIME: 6.8 s
DEVICE MODEL ICD: 634653
RV LEAD AMPLITUDE: 11.7 mv
RV LEAD IMPEDENCE ICD: 487.5 Ohm
RV LEAD THRESHOLD: 0.75 V
TOT-0007: 4
TOT-0008: 0
TOT-0010: 9
VF: 0

## 2011-04-25 NOTE — Assessment & Plan Note (Signed)
Stable without volume overload on exam  Normal ICD function See Pace Art report No changes today

## 2011-04-25 NOTE — Patient Instructions (Signed)
Your physician wants you to follow-up in: 05/2011 with Dr Johney Frame Bonita Quin will receive a reminder letter in the mail two months in advance. If you don't receive a letter, please call our office to schedule the follow-up appointment.

## 2011-04-25 NOTE — Progress Notes (Signed)
The patient presents today for routine electrophysiology followup.  Since having her ICD implanted, the patient reports doing very well.  Today, she denies symptoms of palpitations, fevers, chills, chest pain, shortness of breath, orthopnea, PND, lower extremity edema, dizziness, presyncope, syncope, or neurologic sequela.  The patient feels that she is tolerating medications without difficulties and is otherwise without complaint today.   Past Medical History  Diagnosis Date  . 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 HTN (hypertension)   . VT (ventricular tachycardia)     nonsustained  . Hypokalemia   . Obesity   . Pericardial effusion   . Diastolic dysfunction     grade 1   Past Surgical History  Procedure Date  . Tonselectomy   . Breast lumpectomy 1989    L breast- benign  . Tubal ligation   . Cholecystectomy   . Cardiac defibrillator placement 5/12    [facility] by [facility]    Current Outpatient Prescriptions  Medication Sig Dispense Refill  . ALDACTONE 25 MG tablet TAKE 1 TABLET BY MOUTH   TWICE A DAY.  60 each  11  . carvedilol (COREG) 25 MG tablet Take 25 mg by mouth 2 (two) times daily with a meal.        . Coenzyme Q10 (CO Q 10 PO) Take 1 capsule by mouth daily.        . hydrALAZINE (APRESOLINE) 100 MG tablet Take 100 mg by mouth 3 (three) times daily.        . magnesium oxide (MAG-OX) 400 MG tablet Take 400 mg by mouth 2 (two) times daily.         Allergies  Allergen Reactions  . Ace Inhibitors     REACTION: angioedema  . Demerol   . Morphine     REACTION: vomiting    History   Social History  . Marital Status: Married    Spouse Name: N/A    Number of Children: N/A  . Years of Education: N/A   Occupational History  . Not on file.   Social History Main Topics  . Smoking status: Never Smoker   . Smokeless tobacco: Not on file  . Alcohol Use: No  . Drug Use: No  . Sexually Active: Not on file   Other Topics Concern  . Not on file    Social History Narrative   Lives in [place] with spouse.  3 grown children.  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    Family History  Problem Relation Age of Onset  . Hypertension    . Diabetes     Physical Exam: Filed Vitals:   04/25/11 1113  BP: 138/90  Pulse: 68  Height: 5\' 4"  (1.626 m)  Weight: 213 lb (96.616 kg)    GEN- The patient is well appearing, alert and oriented x 3 today.   Head- normocephalic, atraumatic Eyes-  Sclera clear, conjunctiva pink Ears- hearing intact Oropharynx- clear Neck- supple, no JVP Lymph- no cervical lymphadenopathy Lungs- Clear to ausculation bilaterally, normal work of breathing Chest- ICD pocket is well healed Heart- Regular rate and rhythm, no murmurs, rubs or gallops, PMI not laterally displaced GI- soft, NT, ND, + BS Extremities- no clubbing, cyanosis, or edema MS- no significant deformity or atrophy Skin- no rash or lesion Psych- euthymic mood, full affect Neuro- strength and sensation are intact  ICD interrogation- reviewed in detail today,  See PACEART report  Assessment and Plan:

## 2011-04-25 NOTE — Assessment & Plan Note (Signed)
Stable No change required today Salt restriction advised 

## 2011-06-16 ENCOUNTER — Encounter: Payer: Self-pay | Admitting: Internal Medicine

## 2011-06-16 ENCOUNTER — Ambulatory Visit (INDEPENDENT_AMBULATORY_CARE_PROVIDER_SITE_OTHER): Payer: Self-pay | Admitting: Internal Medicine

## 2011-06-16 DIAGNOSIS — I5022 Chronic systolic (congestive) heart failure: Secondary | ICD-10-CM

## 2011-06-16 DIAGNOSIS — I429 Cardiomyopathy, unspecified: Secondary | ICD-10-CM

## 2011-06-16 DIAGNOSIS — I10 Essential (primary) hypertension: Secondary | ICD-10-CM

## 2011-06-16 LAB — ICD DEVICE OBSERVATION
DEV-0020ICD: NEGATIVE
DEVICE MODEL ICD: 634653
TOT-0007: 4
TOT-0008: 0
TOT-0009: 1
TOT-0010: 9
VENTRICULAR PACING ICD: 0 pct

## 2011-06-16 LAB — BASIC METABOLIC PANEL
CO2: 24 mEq/L (ref 19–32)
Calcium: 9 mg/dL (ref 8.4–10.5)
Chloride: 106 mEq/L (ref 96–112)
Potassium: 3.6 mEq/L (ref 3.5–5.1)
Sodium: 138 mEq/L (ref 135–145)

## 2011-06-16 NOTE — Assessment & Plan Note (Signed)
Stable No change required today  

## 2011-06-16 NOTE — Progress Notes (Signed)
The patient presents today for routine electrophysiology followup.  Since last being seen in our clinic, the patient reports doing very well.  She has stable fatigue.  Today, she denies symptoms of palpitations, chest pain, shortness of breath, orthopnea, PND, lower extremity edema, dizziness, presyncope, syncope, or neurologic sequela.  The patient feels that she is tolerating medications without difficulties and is otherwise without complaint today.   Past Medical History  Diagnosis Date  . 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 HTN (hypertension)   . VT (ventricular tachycardia)     nonsustained  . Hypokalemia   . Obesity   . Pericardial effusion   . Diastolic dysfunction     grade 1   Past Surgical History  Procedure Date  . Tonselectomy   . Breast lumpectomy 1989    L breast- benign  . Tubal ligation   . Cholecystectomy   . Cardiac defibrillator placement 5/12    SJM by [facility]    Current Outpatient Prescriptions  Medication Sig Dispense Refill  . carvedilol (COREG) 25 MG tablet Take 25 mg by mouth 2 (two) times daily with a meal.        . co-enzyme Q-10 30 MG capsule Take 30 mg by mouth 3 (three) times daily.        . hydrALAZINE (APRESOLINE) 100 MG tablet Take 100 mg by mouth 3 (three) times daily.        . magnesium oxide (MAG-OX) 400 MG tablet Take 400 mg by mouth 2 (two) times daily.        Marland Kitchen spironolactone (ALDACTONE) 25 MG tablet Take 25 mg by mouth daily.          Allergies  Allergen Reactions  . Ace Inhibitors     REACTION: angioedema  . Demerol   . Morphine     REACTION: vomiting    History   Social History  . Marital Status: Married    Spouse Name: N/A    Number of Children: N/A  . Years of Education: N/A   Occupational History  . Not on file.   Social History Main Topics  . Smoking status: Never Smoker   . Smokeless tobacco: Not on file  . Alcohol Use: No  . Drug Use: No  . Sexually Active: Not on file   Other Topics  Concern  . Not on file   Social History Narrative   Lives in [place] with spouse.  3 grown children.  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    Family History  Problem Relation Age of Onset  . Hypertension    . Diabetes     Physical Exam: Filed Vitals:   06/16/11 1015  BP: 146/88  Pulse: 62  Height: 5\' 5"  (1.651 m)  Weight: 214 lb (97.07 kg)    GEN- The patient is well appearing, alert and oriented x 3 today.   Head- normocephalic, atraumatic Eyes-  Sclera clear, conjunctiva pink Ears- hearing intact Oropharynx- clear Neck- supple, no JVP Lymph- no cervical lymphadenopathy Lungs- Clear to ausculation bilaterally, normal work of breathing Chest- ICD pocket is well healed Heart- Regular rate and rhythm, no murmurs, rubs or gallops, PMI not laterally displaced GI- soft, NT, ND, + BS Extremities- no clubbing, cyanosis, or edema MS- no significant deformity or atrophy Skin- no rash or lesion Psych- euthymic mood, full affect Neuro- strength and sensation are intact  ICD interrogation- reviewed in detail today,  See PACEART report  Assessment and Plan:

## 2011-06-16 NOTE — Patient Instructions (Signed)
Your physician wants you to follow-up in: 12 months with Dr Jacquiline Doe will receive a reminder letter in the mail two months in advance. If you don't receive a letter, please call our office to schedule the follow-up appointment.  Remote monitoring is used to monitor your Pacemaker of ICD from home. This monitoring reduces the number of office visits required to check your device to one time per year. It allows [place] to keep an eye on the functioning of your device to ensure it is working properly. You are scheduled for a device check from home on 09/19/2011 You may send your transmission at any time that day. If you have a wireless device, the transmission will be sent automatically. After your physician reviews your transmission, you will receive a postcard with your next transmission date.   Your physician recommends that you return for lab work today  BMP

## 2011-06-16 NOTE — Assessment & Plan Note (Signed)
euvolemic on exam today No changes Normal ICD function See Pace Art report No changes today  Given prior hypokalemia, we will check BMET today She will follow-up with Dr Daleen Squibb as scheduled.  She will do merlin transmissions every 3 months and I will see her annually.

## 2011-06-17 ENCOUNTER — Encounter: Payer: Self-pay | Admitting: *Deleted

## 2011-06-27 ENCOUNTER — Encounter: Payer: Self-pay | Admitting: Cardiology

## 2011-06-27 ENCOUNTER — Ambulatory Visit (INDEPENDENT_AMBULATORY_CARE_PROVIDER_SITE_OTHER): Payer: Self-pay | Admitting: Cardiology

## 2011-06-27 VITALS — BP 120/84 | HR 73 | Ht 64.0 in | Wt 212.1 lb

## 2011-06-27 DIAGNOSIS — I10 Essential (primary) hypertension: Secondary | ICD-10-CM

## 2011-06-27 DIAGNOSIS — I5022 Chronic systolic (congestive) heart failure: Secondary | ICD-10-CM

## 2011-06-27 DIAGNOSIS — I429 Cardiomyopathy, unspecified: Secondary | ICD-10-CM

## 2011-06-27 DIAGNOSIS — E669 Obesity, unspecified: Secondary | ICD-10-CM | POA: Insufficient documentation

## 2011-06-27 NOTE — Patient Instructions (Addendum)
Your physician encouraged you to lose weight for better health.  A weight loss of 1 pound a week or 4 pounds a month until you reach your ideal goal weight  Your physician also encourages you to watch your carbohydrate intake ( sugars). Please see handout given on carbohydrate counting  Your physician recommends that you schedule a follow-up appointment in: 6 months with Dr. Daleen Squibb

## 2011-06-27 NOTE — Assessment & Plan Note (Signed)
Stable. Blood pressure under good control. Laboratory data recently checked potassium and renal function stable. Return in 6 months.

## 2011-06-27 NOTE — Assessment & Plan Note (Signed)
Markedly improved. Labs stable. No change in treatment.

## 2011-06-27 NOTE — Progress Notes (Signed)
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 Her blood pressure is under much better control. She does have dyspnea if she climbs any incline but can walk on flat ground at a slow paced.  She denies orthopnea, PND or edema. Her defibrillator has not fired.  She's compliant with her medications. She is under a temporary disability. I told her I would support full disability. Recent labs checked and are stable.  EKG shows normal sinus rhythm with LVH. Past Medical History  Diagnosis Date  . 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 HTN (hypertension)   . VT (ventricular tachycardia)     nonsustained  . Hypokalemia   . Obesity   . Pericardial effusion   . Diastolic dysfunction     grade 1    Past Surgical History  Procedure Date  . Tonselectomy   . Breast lumpectomy 1989    L breast- benign  . Tubal ligation   . Cholecystectomy   . Cardiac defibrillator placement 5/12    SJM by [facility]    Family History  Problem Relation Age of Onset  . Hypertension    . Diabetes      History   Social History  . Marital Status: Married    Spouse Name: N/A    Number of Children: N/A  . Years of Education: N/A   Occupational History  . Not on file.   Social History Main Topics  . Smoking status: Never Smoker   . Smokeless tobacco: Not on file  . Alcohol Use: No  . Drug Use: No  . Sexually Active: Not on file   Other Topics Concern  . Not on file   Social History Narrative   Lives in [place] with spouse.  3 grown children.  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    Allergies  Allergen Reactions  . Ace Inhibitors     REACTION: angioedema  . Demerol   . Morphine     REACTION: vomiting    Current Outpatient Prescriptions  Medication Sig Dispense Refill  . carvedilol (COREG) 25 MG tablet Take 25 mg by mouth 2 (two) times daily with a meal.        . co-enzyme Q-10 30 MG capsule Take 30 mg by mouth 3  (three) times daily.        . hydrALAZINE (APRESOLINE) 100 MG tablet Take 100 mg by mouth 3 (three) times daily.        . magnesium oxide (MAG-OX) 400 MG tablet Take 400 mg by mouth 2 (two) times daily.        Marland Kitchen spironolactone (ALDACTONE) 25 MG tablet Take 25 mg by mouth daily.          ROS Negative other than HPI.   PE General Appearance: well developed, well nourished in no acute distress, obese HEENT: symmetrical face, PERRLA, good dentition  Neck: no JVD, thyromegaly, or adenopathy, trachea midline Chest: symmetric without deformity Cardiac: PMI non-displaced, RRR, normal S1, S2, no gallop or murmur Lung: clear to ausculation and percussion Vascular: all pulses full without bruits  Abdominal: nondistended, nontender, good bowel sounds, no HSM, no bruits Extremities: no cyanosis, clubbing or edema, no sign of DVT, no varicosities  Skin: normal color, no rashes Neuro: alert and oriented x 3, non-focal Pysch: normal affect Filed Vitals:   06/27/11 0958  BP: 120/84  Pulse: 73  Height: 5\' 4"  (1.626 m)  Weight: 212 lb 1.9  oz (96.217 kg)    EKG  Labs and Studies Reviewed.   Lab Results  Component Value Date   WBC 8.4 02/28/2011   HGB 11.1* 02/28/2011   HCT 32.8* 02/28/2011   MCV 93.9 02/28/2011   PLT 163.0 02/28/2011      Chemistry      Component Value Date/Time   NA 138 06/16/2011 1037   K 3.6 06/16/2011 1037   CL 106 06/16/2011 1037   CO2 24 06/16/2011 1037   BUN 16 06/16/2011 1037   CREATININE 1.1 06/16/2011 1037      Component Value Date/Time   CALCIUM 9.0 06/16/2011 1037   ALKPHOS 33* 10/07/2010 1400   AST 52* 10/07/2010 1400   ALT 122* 10/07/2010 1400   BILITOT 0.8 10/07/2010 1400       No results found for this basename: CHOL   No results found for this basename: HDL   No results found for this basename: LDLCALC   No results found for this basename: TRIG   No results found for this basename: CHOLHDL   No results found for this basename: HGBA1C   Lab  Results  Component Value Date   ALT 122* 10/07/2010   AST 52* 10/07/2010   ALKPHOS 33* 10/07/2010   BILITOT 0.8 10/07/2010   Lab Results  Component Value Date   TSH 0.957 10/07/2010

## 2011-09-06 ENCOUNTER — Other Ambulatory Visit: Payer: Self-pay | Admitting: Cardiology

## 2011-09-19 ENCOUNTER — Encounter: Payer: Self-pay | Admitting: Internal Medicine

## 2011-09-19 ENCOUNTER — Ambulatory Visit (INDEPENDENT_AMBULATORY_CARE_PROVIDER_SITE_OTHER): Payer: Self-pay | Admitting: *Deleted

## 2011-09-19 ENCOUNTER — Other Ambulatory Visit: Payer: Self-pay

## 2011-09-19 DIAGNOSIS — I5022 Chronic systolic (congestive) heart failure: Secondary | ICD-10-CM

## 2011-09-19 DIAGNOSIS — I429 Cardiomyopathy, unspecified: Secondary | ICD-10-CM

## 2011-09-19 LAB — REMOTE ICD DEVICE
DEVICE MODEL ICD: 634653
RV LEAD AMPLITUDE: 11.7 mv

## 2011-09-23 NOTE — Progress Notes (Signed)
Remote icd check  

## 2011-09-25 ENCOUNTER — Encounter: Payer: Self-pay | Admitting: *Deleted

## 2011-12-03 ENCOUNTER — Ambulatory Visit: Payer: Self-pay | Admitting: Cardiology

## 2011-12-18 ENCOUNTER — Encounter: Payer: Self-pay | Admitting: *Deleted

## 2011-12-22 ENCOUNTER — Encounter: Payer: Self-pay | Admitting: *Deleted

## 2012-01-08 ENCOUNTER — Other Ambulatory Visit: Payer: Self-pay

## 2012-01-08 ENCOUNTER — Ambulatory Visit (INDEPENDENT_AMBULATORY_CARE_PROVIDER_SITE_OTHER): Payer: BC Managed Care – PPO | Admitting: Cardiology

## 2012-01-08 ENCOUNTER — Encounter: Payer: Self-pay | Admitting: Cardiology

## 2012-01-08 ENCOUNTER — Encounter: Payer: Self-pay | Admitting: *Deleted

## 2012-01-08 VITALS — BP 162/108 | HR 92 | Ht 64.0 in | Wt 209.0 lb

## 2012-01-08 DIAGNOSIS — I5022 Chronic systolic (congestive) heart failure: Secondary | ICD-10-CM

## 2012-01-08 DIAGNOSIS — E669 Obesity, unspecified: Secondary | ICD-10-CM

## 2012-01-08 DIAGNOSIS — I10 Essential (primary) hypertension: Secondary | ICD-10-CM

## 2012-01-08 MED ORDER — CARVEDILOL 25 MG PO TABS: 25.0000 mg | ORAL_TABLET | Freq: Two times a day (BID) | ORAL | Status: DC

## 2012-01-08 MED ORDER — ISOSORBIDE DINITRATE 30 MG PO TABS: 30.0000 mg | ORAL_TABLET | Freq: Two times a day (BID) | ORAL | Status: DC

## 2012-01-08 MED ORDER — ISOSORBIDE DINITRATE 30 MG PO TABS: 60.0000 mg | ORAL_TABLET | Freq: Two times a day (BID) | ORAL | Status: DC

## 2012-01-08 NOTE — Assessment & Plan Note (Signed)
Suboptimal control. Please see plan under chronic systolic heart failure. Close followup in 2 weeks to 4 weeks.

## 2012-01-08 NOTE — Patient Instructions (Signed)
Your physician recommends that you schedule a follow-up appointment in: 4 weeks with Dr. Daleen Squibb.  Isosorbide 30mg  twice daily for 10 days THEN Isosorbide 60mg  twice daily.

## 2012-01-08 NOTE — Progress Notes (Signed)
HPI [place] returns today for evaluation and management of her secondary cardiomyopathy, last ejection fraction 25%, very difficult to control hypertension, and obesity.  She has a history of angioedema to an ACE inhibitor. We have avoided an ARB.  She has underlying stress and time. Her son and his family have been back in. She would like to work p.r.n. Just to get out of the house.  She does not use salt. She is compliant with her medications. She has not lost any weight.  She denies orthopnea, PND or edema.  Past Medical History  Diagnosis Date  . 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 HTN (hypertension)   . VT (ventricular tachycardia)     nonsustained  . Hypokalemia   . Obesity   . Pericardial effusion   . Diastolic dysfunction     grade 1    Current Outpatient Prescriptions  Medication Sig Dispense Refill  . Coenzyme Q10 50 MG CAPS Take by mouth 2 (two) times daily.      . hydrALAZINE (APRESOLINE) 100 MG tablet Take 100 mg by mouth 3 (three) times daily.       . magnesium oxide (MAG-OX) 400 MG tablet Take 400 mg by mouth 2 (two) times daily.        Marland Kitchen spironolactone (ALDACTONE) 25 MG tablet Take 25 mg by mouth daily.        . carvedilol (COREG) 25 MG tablet Take 1 tablet (25 mg total) by mouth 2 (two) times daily with a meal.  60 tablet  5  . isosorbide dinitrate (ISORDIL) 30 MG tablet Take 1 tablet (30 mg total) by mouth 2 (two) times daily.  30 tablet  0  . isosorbide dinitrate (ISORDIL) 30 MG tablet Take 2 tablets (60 mg total) by mouth 2 (two) times daily.  120 tablet  6    Allergies  Allergen Reactions  . Ace Inhibitors     REACTION: angioedema  . Demerol   . Morphine     REACTION: vomiting    Family History  Problem Relation Age of Onset  . Hypertension    . Diabetes      History   Social History  . Marital Status: Married    Spouse Name: N/A    Number of Children: N/A  . Years of Education: N/A   Occupational  History  . Not on file.   Social History Main Topics  . Smoking status: Never Smoker   . Smokeless tobacco: Not on file  . Alcohol Use: No  . Drug Use: No  . Sexually Active: Not on file   Other Topics Concern  . Not on file   Social History Narrative   Lives in [place] with spouse.  3 grown children.  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    ROS ALL NEGATIVE EXCEPT THOSE NOTED IN HPI  PE  General Appearance: well developed, well nourished in no acute distress, obese HEENT: symmetrical face, PERRLA, good dentition  Neck: no JVD, thyromegaly, or adenopathy, trachea midline Chest: symmetric without deformity Cardiac: PMI non-displaced, RRR, normal S1, S2, no gallop or murmur Lung: clear to ausculation and percussion Vascular: all pulses full without bruits  Abdominal: nondistended, nontender, good bowel sounds, no HSM, no bruits Extremities: no cyanosis, clubbing or edema, no sign of DVT, no varicosities  Skin: normal color, no rashes Neuro: alert and oriented x 3, non-focal Pysch: normal affect  EKG  BMET  Component Value Date/Time   NA 138 06/16/2011 1037   K 3.6 06/16/2011 1037   CL 106 06/16/2011 1037   CO2 24 06/16/2011 1037   GLUCOSE 124* 06/16/2011 1037   BUN 16 06/16/2011 1037   CREATININE 1.1 06/16/2011 1037   CALCIUM 9.0 06/16/2011 1037   GFRNONAA >60 03/08/2011 0340   GFRAA  Value: >60        The eGFR has been calculated using the MDRD equation. This calculation has not been validated in all clinical situations. eGFR's persistently <60 mL/min signify possible Chronic Kidney Disease. 03/08/2011 0340    Lipid Panel  No results found for this basename: chol, trig, hdl, cholhdl, vldl, ldlcalc    CBC    Component Value Date/Time   WBC 8.4 02/28/2011 1001   RBC 3.49* 02/28/2011 1001   HGB 11.1* 02/28/2011 1001   HCT 32.8* 02/28/2011 1001   PLT 163.0 02/28/2011 1001   MCV 93.9 02/28/2011 1001   MCH 29.6 10/10/2010 0448   MCHC 34.0 02/28/2011 1001   RDW  14.7* 02/28/2011 1001   LYMPHSABS 2.9 02/28/2011 1001   MONOABS 0.7 02/28/2011 1001   EOSABS 0.3 02/28/2011 1001   BASOSABS 0.0 02/28/2011 1001

## 2012-01-08 NOTE — Assessment & Plan Note (Signed)
I have encouraged her to lose weight and increase activity.

## 2012-01-08 NOTE — Assessment & Plan Note (Signed)
We'll add isosorbide 30 mg twice a day for 10 days then increase to 60 mg twice a day for her chronic systolic heart failure and hypertension. On return visit, we may need to add amlodipine.

## 2012-02-10 ENCOUNTER — Other Ambulatory Visit (HOSPITAL_COMMUNITY): Payer: Self-pay | Admitting: Ophthalmology

## 2012-02-16 ENCOUNTER — Encounter: Payer: Self-pay | Admitting: Cardiology

## 2012-02-16 ENCOUNTER — Ambulatory Visit (INDEPENDENT_AMBULATORY_CARE_PROVIDER_SITE_OTHER): Payer: BC Managed Care – PPO | Admitting: Cardiology

## 2012-02-16 VITALS — BP 132/82 | HR 71 | Ht 64.0 in | Wt 207.0 lb

## 2012-02-16 DIAGNOSIS — I10 Essential (primary) hypertension: Secondary | ICD-10-CM

## 2012-02-16 DIAGNOSIS — I429 Cardiomyopathy, unspecified: Secondary | ICD-10-CM

## 2012-02-16 DIAGNOSIS — I5022 Chronic systolic (congestive) heart failure: Secondary | ICD-10-CM

## 2012-02-16 NOTE — Patient Instructions (Signed)
Your physician wants you to follow-up in:6 months with Dr Wall. You will receive a reminder letter in the mail two months in advance. If you don't receive a letter, please call our office to schedule the follow-up appointment.  Your physician recommends that you continue on your current medications as directed. Please refer to the Current Medication list given to you today.  

## 2012-02-16 NOTE — Progress Notes (Signed)
HPI  Anita Warren returns for close followup of her chronic systolic heart failure, viral cardiomyopathy, and difficult to control hypertension. On last visit we had isosorbide dinitrate 30 mg twice a day.  Her pressure was recently 116/80 and today is 132/82. She's having no side effects including headache. She has lost couple more pounds. She's increased her activity. Past Medical History  Diagnosis Date  . 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 HTN (hypertension)   . VT (ventricular tachycardia)     nonsustained  . Hypokalemia   . Obesity   . Pericardial effusion   . Diastolic dysfunction     grade 1    Current Outpatient Prescriptions  Medication Sig Dispense Refill  . carvedilol (COREG) 25 MG tablet Take 1 tablet (25 mg total) by mouth 2 (two) times daily with a meal.  60 tablet  5  . Coenzyme Q10 50 MG CAPS Take by mouth 2 (two) times daily.      . hydrALAZINE (APRESOLINE) 100 MG tablet Take 100 mg by mouth 3 (three) times daily.       . isosorbide dinitrate (ISORDIL) 30 MG tablet Take 1 tablet (30 mg total) by mouth 2 (two) times daily.  30 tablet  0  . magnesium oxide (MAG-OX) 400 MG tablet Take 400 mg by mouth 2 (two) times daily.        Marland Kitchen spironolactone (ALDACTONE) 25 MG tablet Take 25 mg by mouth daily.          Allergies  Allergen Reactions  . Ace Inhibitors     REACTION: angioedema  . Demerol   . Morphine     REACTION: vomiting    Family History  Problem Relation Age of Onset  . Hypertension    . Diabetes      History   Social History  . Marital Status: Married    Spouse Name: N/A    Number of Children: N/A  . Years of Education: N/A   Occupational History  . Not on file.   Social History Main Topics  . Smoking status: Never Smoker   . Smokeless tobacco: Not on file  . Alcohol Use: No  . Drug Use: No  . Sexually Active: Not on file   Other Topics Concern  . Not on file   Social History Narrative   [place] in  [place].  3 grown children.  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 ALL NEGATIVE EXCEPT THOSE NOTED IN HPI  PE  General Appearance: well developed, well nourished in no acute distress HEENT: symmetrical face, PERRLA, good dentition  Neck: no JVD, thyromegaly, or adenopathy, trachea midline Chest: symmetric without deformity Cardiac: PMI non-displaced, RRR, normal S1, S2, no gallop or murmur Lung: clear to ausculation and percussion Vascular: all pulses full without bruits  Abdominal: nondistended, nontender, good bowel sounds, no HSM, no bruits Extremities: no cyanosis, clubbing or edema, no sign of DVT, no varicosities  Skin: normal color, no rashes Neuro: alert and oriented x 3, non-focal Pysch: normal affect  EKG  BMET    Component Value Date/Time   NA 138 06/16/2011 1037   K 3.6 06/16/2011 1037   CL 106 06/16/2011 1037   CO2 24 06/16/2011 1037   GLUCOSE 124* 06/16/2011 1037   BUN 16 06/16/2011 1037   CREATININE 1.1 06/16/2011 1037   CALCIUM 9.0 06/16/2011 1037   GFRNONAA >60 03/08/2011 0340   GFRAA  Value: >60        The eGFR has been calculated using the MDRD equation. This calculation has not been validated in all clinical situations. eGFR's persistently <60 mL/min signify possible Chronic Kidney Disease. 03/08/2011 0340    Lipid Panel  No results found for this basename: chol, trig, hdl, cholhdl, vldl, ldlcalc    CBC    Component Value Date/Time   WBC 8.4 02/28/2011 1001   RBC 3.49* 02/28/2011 1001   HGB 11.1* 02/28/2011 1001   HCT 32.8* 02/28/2011 1001   PLT 163.0 02/28/2011 1001   MCV 93.9 02/28/2011 1001   MCH 29.6 10/10/2010 0448   MCHC 34.0 02/28/2011 1001   RDW 14.7* 02/28/2011 1001   LYMPHSABS 2.9 02/28/2011 1001   MONOABS 0.7 02/28/2011 1001   EOSABS 0.3 02/28/2011 1001   BASOSABS 0.0 02/28/2011 1001

## 2012-02-16 NOTE — Assessment & Plan Note (Signed)
Much improved. No change in medical therapy. Continue to lose weight and exercise.

## 2012-02-17 ENCOUNTER — Encounter (HOSPITAL_COMMUNITY): Payer: Self-pay

## 2012-02-17 ENCOUNTER — Other Ambulatory Visit (HOSPITAL_COMMUNITY): Payer: Self-pay | Admitting: Ophthalmology

## 2012-02-17 ENCOUNTER — Ambulatory Visit (HOSPITAL_COMMUNITY)
Admission: RE | Admit: 2012-02-17 | Discharge: 2012-02-17 | Disposition: A | Discharge status: Home / Self Care | Payer: BC Managed Care – PPO | Source: Ambulatory Visit | Attending: Ophthalmology | Admitting: Ophthalmology

## 2012-02-17 DIAGNOSIS — R51 Headache: Secondary | ICD-10-CM | POA: Insufficient documentation

## 2012-02-17 DIAGNOSIS — H4710 Unspecified papilledema: Secondary | ICD-10-CM | POA: Insufficient documentation

## 2012-02-17 MED ORDER — IOHEXOL 300 MG/ML  SOLN
100.0000 mL | Freq: Once | INTRAMUSCULAR | Status: AC | PRN
  Administered 2012-02-17: 100 mL via INTRAVENOUS

## 2012-03-22 ENCOUNTER — Encounter: Payer: Self-pay | Admitting: *Deleted

## 2012-04-26 ENCOUNTER — Emergency Department (HOSPITAL_COMMUNITY)
Admission: EM | Admit: 2012-04-26 | Discharge: 2012-04-26 | Disposition: A | Discharge status: Home / Self Care | Payer: BC Managed Care – PPO | Attending: Emergency Medicine | Admitting: Emergency Medicine

## 2012-04-26 ENCOUNTER — Encounter (HOSPITAL_COMMUNITY): Payer: Self-pay | Admitting: *Deleted

## 2012-04-26 DIAGNOSIS — I10 Essential (primary) hypertension: Secondary | ICD-10-CM | POA: Insufficient documentation

## 2012-04-26 DIAGNOSIS — M791 Myalgia, unspecified site: Secondary | ICD-10-CM

## 2012-04-26 DIAGNOSIS — B9789 Other viral agents as the cause of diseases classified elsewhere: Secondary | ICD-10-CM | POA: Insufficient documentation

## 2012-04-26 DIAGNOSIS — R51 Headache: Secondary | ICD-10-CM

## 2012-04-26 DIAGNOSIS — E669 Obesity, unspecified: Secondary | ICD-10-CM | POA: Insufficient documentation

## 2012-04-26 DIAGNOSIS — B349 Viral infection, unspecified: Secondary | ICD-10-CM

## 2012-04-26 DIAGNOSIS — IMO0001 Reserved for inherently not codable concepts without codable children: Secondary | ICD-10-CM | POA: Insufficient documentation

## 2012-04-26 MED ORDER — SODIUM CHLORIDE 0.9 % IV BOLUS (SEPSIS)
500.0000 mL | INTRAVENOUS | Status: AC
  Administered 2012-04-26: 03:00:00 via INTRAVENOUS

## 2012-04-26 MED ORDER — HYDROMORPHONE HCL PF 1 MG/ML IJ SOLN
INTRAMUSCULAR | Status: AC
  Administered 2012-04-26: 1 mg
  Filled 2012-04-26: qty 1

## 2012-04-26 MED ORDER — HYDROMORPHONE HCL PF 1 MG/ML IJ SOLN
1.0000 mg | Freq: Once | INTRAMUSCULAR | Status: AC
  Administered 2012-04-26: 1 mg via INTRAVENOUS
  Filled 2012-04-26: qty 1

## 2012-04-26 MED ORDER — ONDANSETRON HCL 4 MG/2ML IJ SOLN
4.0000 mg | Freq: Once | INTRAMUSCULAR | Status: AC
  Administered 2012-04-26: 4 mg via INTRAVENOUS
  Filled 2012-04-26: qty 2

## 2012-04-26 MED ORDER — KETOROLAC TROMETHAMINE 30 MG/ML IJ SOLN
30.0000 mg | Freq: Once | INTRAMUSCULAR | Status: AC
  Administered 2012-04-26: 30 mg via INTRAVENOUS
  Filled 2012-04-26: qty 1

## 2012-04-26 NOTE — ED Provider Notes (Signed)
History     CSN: [id number]  Arrival date & time 04/26/12  0137   First MD Initiated Contact with Patient 04/26/12 0226      Chief Complaint  Patient presents with  . Headache  . Chills  . Fever  . Numbness    (Consider location/radiation/quality/duration/timing/severity/associated sxs/prior treatment) HPI  Anita Warren is a 51 y.o. female with non ischemic cardiomyopathy with AICD, HTN who presents to the Emergency Department complaining of headache, myalgias, chills, fever that began two days ago and has not responded to tylenol. Denies vision changes, difficulty speaking or swallowing, neck stiffness, nausea, vomiting, diarrhea, shortness of breath, chest pain.  PCP Dr. Sherwood Gambler   Past Medical History  Diagnosis Date  . 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 HTN (hypertension)   . VT (ventricular tachycardia)     nonsustained  . Hypokalemia   . Obesity   . Pericardial effusion   . Diastolic dysfunction     grade 1    Past Surgical History  Procedure Date  . Tonselectomy   . Breast lumpectomy 1989    L breast- benign  . Tubal ligation   . Cholecystectomy   . Cardiac defibrillator placement 5/12    SJM by [facility]    Family History  Problem Relation Age of Onset  . Hypertension    . Diabetes      History  Substance Use Topics  . Smoking status: Never Smoker   . Smokeless tobacco: Not on file  . Alcohol Use: No    OB History    Grav Para Term Preterm Abortions TAB SAB Ect Mult Living                  Review of Systems  Constitutional: Positive for fever and chills. Negative for fatigue.       10 Systems reviewed and are negative for acute change except as noted in the HPI.  HENT: Negative for congestion.   Eyes: Negative for discharge and redness.  Respiratory: Negative for cough and shortness of breath.   Cardiovascular: Negative for chest pain.  Gastrointestinal: Negative for vomiting and abdominal pain.    Musculoskeletal: Positive for myalgias. Negative for back pain.  Skin: Negative for rash.  Neurological: Positive for headaches. Negative for syncope and numbness.  Psychiatric/Behavioral:       No behavior change.    Allergies  Ace inhibitors; Demerol; and Morphine  Home Medications   Current Outpatient Rx  Name Route Sig Dispense Refill  . CARVEDILOL 25 MG PO TABS Oral Take 1 tablet (25 mg total) by mouth 2 (two) times daily with a meal. 60 tablet 5  . COENZYME Q10 50 MG PO CAPS Oral Take by mouth 2 (two) times daily.    Marland Kitchen HYDRALAZINE HCL 100 MG PO TABS Oral Take 100 mg by mouth 3 (three) times daily.     . ISOSORBIDE DINITRATE 30 MG PO TABS Oral Take 1 tablet (30 mg total) by mouth 2 (two) times daily. 30 tablet 0  . MAGNESIUM OXIDE 400 MG PO TABS Oral Take 400 mg by mouth 2 (two) times daily.        BP 136/91  Pulse 87  Temp 100.1 F (37.8 C) (Oral)  Resp 20  Ht 5\' 4"  (1.626 m)  Wt 195 lb (88.451 kg)  BMI 33.47 kg/m2  SpO2 99%  LMP 10/28/1999  Physical Exam  Nursing note and vitals reviewed. Constitutional: She is oriented to  person, place, and time. She appears well-developed and well-nourished.       Awake, alert, nontoxic appearance.  HENT:  Head: Atraumatic.  Right Ear: External ear normal.  Left Ear: External ear normal.  Nose: Nose normal.  Mouth/Throat: Oropharynx is clear and moist.  Eyes: Conjunctivae and EOM are normal. Pupils are equal, round, and reactive to light. Right eye exhibits no discharge. Left eye exhibits no discharge.  Neck: Normal range of motion. Neck supple.  Cardiovascular: Normal rate and intact distal pulses.   Murmur heard. Pulmonary/Chest: Effort normal and breath sounds normal. She exhibits no tenderness.  Abdominal: Soft. Bowel sounds are normal. There is no tenderness. There is no rebound.  Musculoskeletal: Normal range of motion. She exhibits no tenderness.       Baseline ROM, no obvious new focal weakness.  Neurological: She  is alert and oriented to person, place, and time.       Mental status and motor strength appears baseline for patient and situation.  Skin: No rash noted.  Psychiatric: She has a normal mood and affect.    ED Course  Procedures (including critical care time)    1. Viral illness   2. Headache   3. Myalgia       MDM  Patient presents with chills, fever, myalgias, headache x 2 days. Given IVF, analgesic, antiinflammatory, antiemetic with improvement. She drank PO fluids.   Pt feels improved after observation and/or treatment in ED.Pt stable in ED with no significant deterioration in condition.The patient appears reasonably screened and/or stabilized for discharge and I doubt any other medical condition or other [facility] requiring further screening, evaluation, or treatment in the ED at this time prior to discharge.  MDM Reviewed: nursing note and vitals           Nicoletta Dress. Colon Branch, MD 04/26/12 2207

## 2012-04-26 NOTE — Discharge Instructions (Signed)
Drink fluids. Use tylenol or ibuprofen for discomfort. Follow up with your doctor.

## 2012-04-26 NOTE — ED Notes (Signed)
Pt c/o headache, fever, chills, and finger numbness (off and on).

## 2012-04-27 ENCOUNTER — Emergency Department (HOSPITAL_COMMUNITY): Payer: BC Managed Care – PPO

## 2012-04-27 ENCOUNTER — Emergency Department (HOSPITAL_COMMUNITY)
Admission: EM | Admit: 2012-04-27 | Discharge: 2012-04-28 | Disposition: A | Discharge status: Home / Self Care | Payer: BC Managed Care – PPO | Attending: Emergency Medicine | Admitting: Emergency Medicine

## 2012-04-27 ENCOUNTER — Encounter (HOSPITAL_COMMUNITY): Payer: Self-pay | Admitting: *Deleted

## 2012-04-27 DIAGNOSIS — H538 Other visual disturbances: Secondary | ICD-10-CM | POA: Insufficient documentation

## 2012-04-27 DIAGNOSIS — I10 Essential (primary) hypertension: Secondary | ICD-10-CM | POA: Insufficient documentation

## 2012-04-27 DIAGNOSIS — R51 Headache: Secondary | ICD-10-CM

## 2012-04-27 DIAGNOSIS — J3489 Other specified disorders of nose and nasal sinuses: Secondary | ICD-10-CM | POA: Insufficient documentation

## 2012-04-27 LAB — URINALYSIS, ROUTINE W REFLEX MICROSCOPIC
Glucose, UA: NEGATIVE mg/dL
Ketones, ur: NEGATIVE mg/dL
Leukocytes, UA: NEGATIVE
Specific Gravity, Urine: <1.005 — ABNORMAL LOW (ref 1.005–1.030)
pH: 7 (ref 5.0–8.0)

## 2012-04-27 LAB — POCT I-STAT, CHEM 8
Chloride: 105 mEq/L (ref 96–112)
HCT: 32 % — ABNORMAL LOW (ref 36.0–46.0)
Hemoglobin: 10.9 g/dL — ABNORMAL LOW (ref 12.0–15.0)
Potassium: 3 mEq/L — ABNORMAL LOW (ref 3.5–5.1)
Sodium: 142 mEq/L (ref 135–145)

## 2012-04-27 LAB — PREGNANCY, URINE: Preg Test, Ur: NEGATIVE

## 2012-04-27 MED ORDER — POTASSIUM CHLORIDE 20 MEQ/15ML (10%) PO LIQD
40.0000 meq | Freq: Once | ORAL | Status: AC
  Administered 2012-04-27: 40 meq via ORAL
  Filled 2012-04-27: qty 30

## 2012-04-27 MED ORDER — METOCLOPRAMIDE HCL 5 MG/ML IJ SOLN
10.0000 mg | Freq: Once | INTRAMUSCULAR | Status: AC
  Administered 2012-04-27: 10 mg via INTRAVENOUS
  Filled 2012-04-27: qty 2

## 2012-04-27 MED ORDER — KETOROLAC TROMETHAMINE 30 MG/ML IJ SOLN
30.0000 mg | Freq: Once | INTRAMUSCULAR | Status: AC
  Administered 2012-04-27: 30 mg via INTRAVENOUS
  Filled 2012-04-27 (×2): qty 1

## 2012-04-27 MED ORDER — SODIUM CHLORIDE 0.9 % IV SOLN
INTRAVENOUS | Status: DC
  Administered 2012-04-27: 22:00:00 via INTRAVENOUS

## 2012-04-27 MED ORDER — DIPHENHYDRAMINE HCL 50 MG/ML IJ SOLN
50.0000 mg | Freq: Once | INTRAMUSCULAR | Status: AC
  Administered 2012-04-27: 50 mg via INTRAVENOUS
  Filled 2012-04-27: qty 1

## 2012-04-27 NOTE — ED Notes (Signed)
Patient states she can fell the blood pulsating in her right neck and her vision is cloudy

## 2012-04-27 NOTE — ED Notes (Signed)
Patient here on Sunday . No better today. Still feel dizzy and vision is blurry.

## 2012-04-28 MED ORDER — METOCLOPRAMIDE HCL 10 MG PO TABS: 10.0000 mg | ORAL_TABLET | Freq: Four times a day (QID) | ORAL | Status: DC | PRN

## 2012-04-28 NOTE — ED Provider Notes (Signed)
History     CSN: [id number]  Arrival date & time 04/27/12  2053   First MD Initiated Contact with Patient 04/27/12 2109      Chief Complaint  Patient presents with  . Numbness  . Headache  . Dizziness  . Eye Problem    HPI Pt was seen at 2135.  Per pt, c/o gradual onset and persistence of constant acute flair of her chronic "headache" for the past 2 days.  Describes the headache as per her usual chronic headache pain pattern for the past several months; located in her frontal/forehead area and "throbbing."  Has been associated with "blurry" vision.  States she usually takes tylenol, but her symptoms did not resolve after taking it yesterday.  Pt endorses she has been extensively eval by her PMD and Eye doctors for same symptoms.  Denies headache was sudden or maximal in onset or at any time.  Denies focal motor weakness, no tingling/numbness in extremities, no fevers, no neck pain, no rash.       Past Medical History  Diagnosis Date  . 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 HTN (hypertension)   . VT (ventricular tachycardia)     nonsustained  . Hypokalemia   . Obesity   . Pericardial effusion   . Diastolic dysfunction     grade 1    Past Surgical History  Procedure Date  . Tonselectomy   . Breast lumpectomy 1989    L breast- benign  . Tubal ligation   . Cholecystectomy   . Cardiac defibrillator placement 5/12    SJM by JA    Family History  Problem Relation Age of Onset  . Hypertension    . Diabetes      History  Substance Use Topics  . Smoking status: Never Smoker   . Smokeless tobacco: Not on file  . Alcohol Use: No    Review of Systems ROS: Statement: All systems negative except as marked or noted in the HPI; Constitutional: Negative for fever and chills. ; ; Eyes: Negative for eye pain, redness and discharge. ; ; ENMT: Negative for ear pain, hoarseness, nasal congestion, sinus pressure and sore throat. ; ; Cardiovascular:  Negative for chest pain, palpitations, diaphoresis, dyspnea and peripheral edema. ; ; Respiratory: Negative for cough, wheezing and stridor. ; ; Gastrointestinal: Negative for nausea, vomiting, diarrhea, abdominal pain, blood in stool, hematemesis, jaundice and rectal bleeding. . ; ; Genitourinary: Negative for dysuria, flank pain and hematuria. ; ; Musculoskeletal: Negative for back pain and neck pain. Negative for swelling and trauma.; ; Skin: Negative for pruritus, rash, abrasions, blisters, bruising and skin lesion.; ; Neuro: +headache, blurred vision. Negative for lightheadedness and neck stiffness. Negative for weakness, altered level of consciousness , altered mental status, extremity weakness, paresthesias, involuntary movement, seizure and syncope.     Allergies  Peanut-containing drug products; Ace inhibitors; Morphine; and Demerol  Home Medications   Current Outpatient Rx  Name Route Sig Dispense Refill  . ACETAMINOPHEN 500 MG PO TABS Oral Take 1,000 mg by mouth as needed. For pain    . CARVEDILOL 25 MG PO TABS Oral Take 1 tablet (25 mg total) by mouth 2 (two) times daily with a meal. 60 tablet 5  . HYDRALAZINE HCL 100 MG PO TABS Oral Take 100 mg by mouth 3 (three) times daily.     . ISOSORBIDE DINITRATE 30 MG PO TABS Oral Take 1 tablet (30 mg total) by mouth 2 (two)  times daily. 30 tablet 0    BP 165/89  Pulse 76  Temp 98.7 F (37.1 C) (Oral)  Resp 22  Ht 5\' 4"  (1.626 m)  Wt 195 lb (88.451 kg)  BMI 33.47 kg/m2  SpO2 100%  LMP 10/28/1999  Physical Exam 2140: Physical examination:  Nursing notes reviewed; Vital signs and O2 SAT reviewed;  Constitutional: Well developed, Well nourished, Well hydrated, In no acute distress; Head:  Normocephalic, atraumatic; Eyes: EOMI, PERRL, No scleral icterus Eye Exam: Right pupil: Size: 3 mm; Findings: Normal, Briskly reactive; Left pupil: Size: 3 mm; Findings: Normal, Briskly reactive; Extraocular movement: Bilateral normal, No nystagmus. ;  Eyelid: Bilateral normal, No edema.  No ptosis.. ; Conjunctiva and sclera: Bilateral normal, No conjunctival injection, no chemosis, no discharge.  No obvious hyphema or hypopion; Retina: Non-dilated fundoscopic exam without obvious papilledema or hemorrhage bilat.;; ENMT: TM's clear bilat. +edemetous nasal turbinates bilat with clear rhinorrhea. Mouth and pharynx normal, Mucous membranes moist; Neck: Supple, Full range of motion, No lymphadenopathy, no meningeal signs; Cardiovascular: Regular rate and rhythm, No gallop; Respiratory: Breath sounds clear & equal bilaterally, No wheezes.  Speaking full sentences with ease, Normal respiratory effort/excursion; Chest: Nontender, Movement normal; Abdomen: Soft, Nontender, Nondistended, Normal bowel sounds;; Extremities: Pulses normal, No tenderness, No edema, No calf edema or asymmetry.; Neuro: AA&Ox3, Major CN grossly intact.  Speech clear. Strength 5/5 equal bilat UE's and LE's.  DTR 2/4 equal bilat UE's and LE's.  No gross sensory deficits. No facial droop.  No nystagmus.;.; Skin: Color normal, Warm, Dry, no rash.   ED Course  Procedures    MDM  MDM Reviewed: previous chart, nursing note and vitals Reviewed previous: ECG and labs Interpretation: labs, CT scan and ECG    Date: 04/28/2012  Rate: 70  Rhythm: normal sinus rhythm  QRS Axis: left  Intervals: normal  ST/T Wave abnormalities: normal  Conduction Disutrbances:none  Narrative Interpretation:   Old EKG Reviewed: unchanged; no significant changes from previous EKG dated 03/08/2011.   Results for orders placed during the hospital encounter of 04/27/12  URINALYSIS, ROUTINE W REFLEX MICROSCOPIC      Component Value Range   Color, Urine STRAW (*) YELLOW   APPearance CLEAR  CLEAR   Specific Gravity, Urine <1.005 (*) 1.005 - 1.030   pH 7.0  5.0 - 8.0   Glucose, UA NEGATIVE  NEGATIVE mg/dL   Hgb urine dipstick NEGATIVE  NEGATIVE   Bilirubin Urine NEGATIVE  NEGATIVE   Ketones, ur NEGATIVE   NEGATIVE mg/dL   Protein, ur NEGATIVE  NEGATIVE mg/dL   Urobilinogen, UA 0.2  0.0 - 1.0 mg/dL   Nitrite NEGATIVE  NEGATIVE   Leukocytes, UA NEGATIVE  NEGATIVE  PREGNANCY, URINE      Component Value Range   Preg Test, Ur NEGATIVE  NEGATIVE  POCT I-STAT, CHEM 8      Component Value Range   Sodium 142  135 - 145 mEq/L   Potassium 3.0 (*) 3.5 - 5.1 mEq/L   Chloride 105  96 - 112 mEq/L   BUN 14  6 - 23 mg/dL   Creatinine, Ser 6.21  0.50 - 1.10 mg/dL   Glucose, Bld 308 (*) 70 - 99 mg/dL   Calcium, Ion 6.57  1.12 - 1.32 mmol/L   TCO2 25  0 - 100 mmol/L   Hemoglobin 10.9 (*) 12.0 - 15.0 g/dL   HCT 84.6 (*) 96.2 - 95.2 %   Ct Head Wo Contrast 04/27/2012  *RADIOLOGY REPORT*  Clinical Data:  Headache, blurred vision.  CT HEAD WITHOUT CONTRAST  Technique:  Contiguous axial images were obtained from the base of the skull through the vertex without contrast.  Comparison: 02/17/2012  Findings: No acute intracranial abnormality.  Specifically, no hemorrhage, hydrocephalus, mass lesion, acute infarction, or significant intracranial injury.  No acute calvarial abnormality. Visualized paranasal sinuses and mastoids clear.  Orbital soft tissues unremarkable.  IMPRESSION: No acute intracranial abnormality.  Original Report Authenticated By: Cyndie Chime, M.D.   Results for LEVONIA, WOLFLEY (MRN [id number]) as of 04/28/2012 00:05  Ref. Range 02/28/2011 10:01 04/27/2012 22:05  Hemoglobin Latest Range: 12.0-15.0 g/dL 30.8 (L) 65.7 (L)  HCT Latest Range: 36.0-46.0 % 32.8 (L) 32.0 (L)      12:04 AM:  Pt's H/H at baseline.  Potassium repleted PO.  Feels completely improved and wants to go home now.  Dx testing d/w pt and family.  Questions answered.  Verb understanding, agreeable to d/c home with outpt f/u.         Laray Anger, DO 04/28/12 2100

## 2012-05-26 ENCOUNTER — Telehealth: Payer: Self-pay

## 2012-05-26 NOTE — Telephone Encounter (Signed)
Called pt to schedule colonoscopy. She has problems with hemorrhoids. OV appt on 06/01/2012 at 9:00 AM with Gerrit Halls, NP.

## 2012-05-27 ENCOUNTER — Other Ambulatory Visit: Payer: Self-pay | Admitting: Cardiology

## 2012-06-01 ENCOUNTER — Ambulatory Visit: Payer: BC Managed Care – PPO | Admitting: Gastroenterology

## 2012-06-01 ENCOUNTER — Other Ambulatory Visit: Payer: Self-pay | Admitting: Gastroenterology

## 2012-06-01 ENCOUNTER — Encounter: Payer: Self-pay | Admitting: Gastroenterology

## 2012-06-01 ENCOUNTER — Ambulatory Visit (INDEPENDENT_AMBULATORY_CARE_PROVIDER_SITE_OTHER): Payer: BC Managed Care – PPO | Admitting: Gastroenterology

## 2012-06-01 VITALS — BP 155/90 | HR 72 | Temp 98.4°F | Ht 64.0 in | Wt 206.2 lb

## 2012-06-01 DIAGNOSIS — Z1211 Encounter for screening for malignant neoplasm of colon: Secondary | ICD-10-CM

## 2012-06-01 MED ORDER — PEG 3350-KCL-NA BICARB-NACL 420 G PO SOLR: ORAL | Status: AC

## 2012-06-01 NOTE — Progress Notes (Signed)
Referring Provider: Cassell Smiles., MD Primary Care Physician:  Cassell Smiles., MD Primary Gastroenterologist:  Dr. Darrick Penna   Chief Complaint  Patient presents with  . Colonoscopy    HPI:   [name] is a 51 year old female who presents today at the request of Landry Mellow, PA/Dr. Sherwood Gambler for initial screening colonoscopy. Denies any abdominal pain. No N/V. Occasional heartburn, rare. Denies constipation or diarrhea. No rectal bleeding. Occasionally will have issues with hemorrhoids. No complaints now. Doesn't take OTC agents, states just knows it is there.  FH negative for colon cancer. Has defibrillator.    Past Medical History  Diagnosis Date  . 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 HTN (hypertension)   . VT (ventricular tachycardia)     nonsustained  . Hypokalemia   . Obesity   . Pericardial effusion   . Diastolic dysfunction     grade 1    Past Surgical History  Procedure Date  . Tonsillectomy   . Breast lumpectomy 1989    L breast- benign  . Tubal ligation   . Cholecystectomy   . Cardiac defibrillator placement 5/12    SJM by [facility]    Current Outpatient Prescriptions  Medication Sig Dispense Refill  . acetaminophen (TYLENOL) 500 MG tablet Take 1,000 mg by mouth as needed. For pain      . carvedilol (COREG) 25 MG tablet Take 1 tablet (25 mg total) by mouth 2 (two) times daily with a meal.  60 tablet  5  . hydrALAZINE (APRESOLINE) 100 MG tablet TAKE 1 TABLET BY MOUTH TWICE DAILY.  60 tablet  5  . isosorbide dinitrate (ISORDIL) 30 MG tablet Take 1 tablet (30 mg total) by mouth 2 (two) times daily.  30 tablet  0  . spironolactone (ALDACTONE) 25 MG tablet Take 25 mg by mouth daily.       . metoCLOPramide (REGLAN) 10 MG tablet Take 1 tablet (10 mg total) by mouth every 6 (six) hours as needed (nausea or headache).  6 tablet  0    Allergies as of 06/01/2012 - Review Complete 06/01/2012  Allergen Reaction Noted  . Peanut-containing drug  products Anaphylaxis 04/27/2012  . Ace inhibitors    . Morphine Nausea And Vomiting   . Demerol Rash 03/21/2011    Family History  Problem Relation Age of Onset  . Hypertension    . Diabetes    . Colon cancer Neg Hx     History   Social History  . Marital Status: Married    Spouse Name: N/A    Number of Children: N/A  . Years of Education: N/A   Occupational History  . Nurse [employer], was at [employer] for 16 years    Social History Main Topics  . Smoking status: Never Smoker   . Smokeless tobacco: Not on file  . Alcohol Use: No  . Drug Use: No  . Sexually Active: Not on file   Other Topics Concern  . Not on file   Social History Narrative   Lives in [place] with spouse.  3 grown children.  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    Review of Systems: Gen: Denies any fever, chills, loss of appetite, fatigue, weight loss. CV: Denies chest pain, heart palpitations, syncope, peripheral edema. Resp: Denies shortness of breath with rest, cough, wheezing GI: Denies dysphagia or odynophagia. Denies hematemesis, fecal incontinence, or jaundice.  GU : Denies urinary burning,  urinary frequency, urinary incontinence.  MS: Denies joint pain, muscle weakness, cramps, limited movement Derm: Denies rash, itching, dry skin Psych: Denies depression, anxiety, confusion or memory loss  Heme: Denies bruising, bleeding, and enlarged lymph nodes.  Physical Exam: BP 155/90  Pulse 72  Temp 98.4 F (36.9 C) (Temporal)  Ht 5\' 4"  (1.626 m)  Wt 206 lb 3.2 oz (93.532 kg)  BMI 35.39 kg/m2  LMP 10/28/1999 General:   Alert and oriented. Well-developed, well-nourished, pleasant and cooperative. Head:  Normocephalic and atraumatic. Eyes:  Conjunctiva pink, sclera clear, no icterus.    Ears:  Normal auditory acuity. Nose:  No deformity, discharge,  or lesions. Mouth:  No deformity or lesions, mucosa pink and moist.  Neck:  Supple, without mass or  thyromegaly. Lungs:  Clear to auscultation bilaterally, without wheezing, rales, or rhonchi.  Heart:  S1, S2 present without murmurs noted.  Abdomen:  +BS, soft, non-tender and non-distended. Without mass or HSM. No rebound or guarding. No hernias noted. Rectal:  Deferred  Msk:  Symmetrical without gross deformities. Normal posture. Extremities:  Without clubbing or edema. Neurologic:  Alert and  oriented x4;  grossly normal neurologically. Skin:  Intact, warm and dry without significant lesions or rashes Cervical Nodes:  No significant cervical adenopathy. Psych:  Alert and cooperative. Normal mood and affect.

## 2012-06-01 NOTE — Assessment & Plan Note (Signed)
51 year old female with need for initial screening colonoscopy. No lower or upper GI symptoms of concern. Notes occasional issues with hemorrhoids but has not had to use an OTC agents. As of note, she has a defibrillator. ALSO ALLERGIC TO DEMEROL, CAUSES RASH.   Proceed with colonoscopy with Dr. Darrick Penna in the near future. The risks, benefits, and alternatives have been discussed in detail with the patient. They state understanding and desire to proceed.

## 2012-06-01 NOTE — Progress Notes (Unsigned)
FOR TCS:  Pt has defibrillator and ALLERGIC TO DEMEROL. Causes Rash.  Please note for ENDO.  Thanks!

## 2012-06-01 NOTE — Patient Instructions (Addendum)
We have set you up for a colonoscopy with Dr. Darrick Penna.  Further recommendations to follow once this is completed.

## 2012-06-01 NOTE — Progress Notes (Signed)
Faxed to PCP

## 2012-06-18 ENCOUNTER — Encounter (HOSPITAL_COMMUNITY): Payer: Self-pay | Admitting: Pharmacy Technician

## 2012-06-30 ENCOUNTER — Encounter: Payer: Self-pay | Admitting: Internal Medicine

## 2012-06-30 ENCOUNTER — Ambulatory Visit (INDEPENDENT_AMBULATORY_CARE_PROVIDER_SITE_OTHER): Payer: BC Managed Care – PPO | Admitting: Internal Medicine

## 2012-06-30 VITALS — BP 142/84 | HR 85 | Resp 18 | Ht 64.0 in | Wt 202.0 lb

## 2012-06-30 DIAGNOSIS — I10 Essential (primary) hypertension: Secondary | ICD-10-CM

## 2012-06-30 DIAGNOSIS — I5022 Chronic systolic (congestive) heart failure: Secondary | ICD-10-CM

## 2012-06-30 DIAGNOSIS — I429 Cardiomyopathy, unspecified: Secondary | ICD-10-CM

## 2012-06-30 LAB — ICD DEVICE OBSERVATION
BRDY-0002RV: 40 {beats}/min
DEV-0020ICD: NEGATIVE
DEVICE MODEL ICD: 634653

## 2012-06-30 NOTE — Assessment & Plan Note (Signed)
euvolemic today   Normal ICD function See Pace Art report No changes today  She is given a cell phone adaptor and instructed to comply with Merlin checks every 3 months I will see again in a year

## 2012-06-30 NOTE — Assessment & Plan Note (Signed)
Stable No change required today  

## 2012-06-30 NOTE — Progress Notes (Signed)
PCP: Cassell Smiles., MD Primary Cardiologist:  Dr Wardell Heath is a 51 y.o. female who presents today for routine electrophysiology followup.  Since last being seen in our clinic, the patient reports doing very well.  Today, she denies symptoms of palpitations, chest pain, shortness of breath,  lower extremity edema, dizziness, presyncope, syncope, or ICD shocks.  The patient is otherwise without complaint today.   Past Medical History  Diagnosis Date  . 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 HTN (hypertension)   . VT (ventricular tachycardia)     nonsustained  . Hypokalemia   . Obesity   . Pericardial effusion   . Diastolic dysfunction     grade 1   Past Surgical History  Procedure Date  . Tonsillectomy   . Breast lumpectomy 1989    L breast- benign  . Tubal ligation   . Cholecystectomy   . Cardiac defibrillator placement 5/12    SJM by [facility]    Current Outpatient Prescriptions  Medication Sig Dispense Refill  . acetaminophen (TYLENOL) 500 MG tablet Take 1,000 mg by mouth as needed. For pain      . carvedilol (COREG) 25 MG tablet Take 25 mg by mouth 2 (two) times daily.      . hydrALAZINE (APRESOLINE) 100 MG tablet Take 100 mg by mouth 2 (two) times daily.      . isosorbide dinitrate (ISORDIL) 30 MG tablet Take 1 tablet (30 mg total) by mouth 2 (two) times daily.  30 tablet  0  . spironolactone (ALDACTONE) 25 MG tablet Take 25 mg by mouth daily.         Physical Exam: Filed Vitals:   06/30/12 1041  BP: 142/84  Pulse: 85  Resp: 18  Height: 5\' 4"  (1.626 m)  Weight: 202 lb (91.627 kg)  SpO2: 97%    GEN- The patient is well appearing, alert and oriented x 3 today.   Head- normocephalic, atraumatic Eyes-  Sclera clear, conjunctiva pink Ears- hearing intact Oropharynx- clear Lungs- Clear to ausculation bilaterally, normal work of breathing Chest- ICD pocket is well healed Heart- Regular rate and rhythm, no murmurs, rubs or  gallops, PMI not laterally displaced GI- soft, NT, ND, + BS Extremities- no clubbing, cyanosis, or edema  ICD interrogation- reviewed in detail today,  See PACEART report  Assessment and Plan:

## 2012-06-30 NOTE — Patient Instructions (Signed)
Remote monitoring is used to monitor your Pacemaker of ICD from home. This monitoring reduces the number of office visits required to check your device to one time per year. It allows us to keep an eye on the functioning of your device to ensure it is working properly. You are scheduled for a device check from home on October 04, 2012. You may send your transmission at any time that day. If you have a wireless device, the transmission will be sent automatically. After your physician reviews your transmission, you will receive a postcard with your next transmission date.  Your physician wants you to follow-up in: 1 year with Dr Allred.  You will receive a reminder letter in the mail two months in advance. If you don't receive a letter, please call our office to schedule the follow-up appointment.  

## 2012-07-02 ENCOUNTER — Encounter (HOSPITAL_COMMUNITY)
Admission: RE | Disposition: A | Discharge status: Home / Self Care | Payer: Self-pay | Source: Ambulatory Visit | Attending: Gastroenterology

## 2012-07-02 ENCOUNTER — Encounter (HOSPITAL_COMMUNITY): Payer: Self-pay | Admitting: *Deleted

## 2012-07-02 ENCOUNTER — Ambulatory Visit (HOSPITAL_COMMUNITY)
Admission: RE | Admit: 2012-07-02 | Discharge: 2012-07-02 | Disposition: A | Discharge status: Home / Self Care | Payer: BC Managed Care – PPO | Source: Ambulatory Visit | Attending: Gastroenterology | Admitting: Gastroenterology

## 2012-07-02 DIAGNOSIS — I10 Essential (primary) hypertension: Secondary | ICD-10-CM | POA: Insufficient documentation

## 2012-07-02 DIAGNOSIS — Z1211 Encounter for screening for malignant neoplasm of colon: Secondary | ICD-10-CM

## 2012-07-02 DIAGNOSIS — K648 Other hemorrhoids: Secondary | ICD-10-CM

## 2012-07-02 DIAGNOSIS — D126 Benign neoplasm of colon, unspecified: Secondary | ICD-10-CM | POA: Insufficient documentation

## 2012-07-02 HISTORY — PX: COLONOSCOPY: SHX5424

## 2012-07-02 SURGERY — COLONOSCOPY
Anesthesia: Moderate Sedation

## 2012-07-02 MED ORDER — SODIUM CHLORIDE 0.45 % IV SOLN
Freq: Once | INTRAVENOUS | Status: AC
  Administered 2012-07-02: 12:00:00 via INTRAVENOUS

## 2012-07-02 MED ORDER — SPOT INK MARKER SYRINGE KIT
PACK | SUBMUCOSAL | Status: DC | PRN
  Administered 2012-07-02: 3 mL via SUBMUCOSAL

## 2012-07-02 MED ORDER — FENTANYL CITRATE 0.05 MG/ML IJ SOLN
INTRAMUSCULAR | Status: DC | PRN
  Administered 2012-07-02 (×2): 25 ug via INTRAVENOUS

## 2012-07-02 MED ORDER — STERILE WATER FOR IRRIGATION IR SOLN
Status: DC | PRN
  Administered 2012-07-02: 12:00:00

## 2012-07-02 MED ORDER — MIDAZOLAM HCL 5 MG/5ML IJ SOLN
INTRAMUSCULAR | Status: DC | PRN
  Administered 2012-07-02: 2 mg via INTRAVENOUS
  Administered 2012-07-02: 1 mg via INTRAVENOUS
  Administered 2012-07-02: 2 mg via INTRAVENOUS

## 2012-07-02 MED ORDER — FENTANYL CITRATE 0.05 MG/ML IJ SOLN
INTRAMUSCULAR | Status: AC
  Filled 2012-07-02: qty 2

## 2012-07-02 MED ORDER — MIDAZOLAM HCL 5 MG/5ML IJ SOLN
INTRAMUSCULAR | Status: AC
  Filled 2012-07-02: qty 10

## 2012-07-02 NOTE — H&P (Signed)
  Primary Care Physician:  Cassell Smiles., MD Primary Gastroenterologist:  Dr. Darrick Penna  Pre-Procedure History & Physical: HPI:  Anita Warren is a 51 y.o. female here for COLON CANCER SCREENING.   Past Medical History  Diagnosis Date  . 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 HTN (hypertension)   . VT (ventricular tachycardia)     nonsustained  . Hypokalemia   . Obesity   . Pericardial effusion   . Diastolic dysfunction     grade 1    Past Surgical History  Procedure Date  . Tonsillectomy   . Breast lumpectomy 1989    L breast- benign  . Tubal ligation   . Cholecystectomy   . Cardiac defibrillator placement 5/12    SJM by JA    Prior to Admission medications   Medication Sig Start Date End Date Taking? Authorizing Provider  acetaminophen (TYLENOL) 500 MG tablet Take 1,000 mg by mouth as needed. For pain   Yes Historical Provider, MD  carvedilol (COREG) 25 MG tablet Take 25 mg by mouth 2 (two) times daily. 01/08/12  Yes Gaylord Shih, MD  hydrALAZINE (APRESOLINE) 100 MG tablet Take 100 mg by mouth 2 (two) times daily.   Yes Historical Provider, MD  isosorbide dinitrate (ISORDIL) 30 MG tablet Take 1 tablet (30 mg total) by mouth 2 (two) times daily. 01/08/12 01/07/13 Yes Gaylord Shih, MD  spironolactone (ALDACTONE) 25 MG tablet Take 25 mg by mouth daily.  04/30/12  Yes Historical Provider, MD    Allergies as of 06/01/2012 - Review Complete 06/01/2012  Allergen Reaction Noted  . Peanut-containing drug products Anaphylaxis 04/27/2012  . Ace inhibitors    . Morphine Nausea And Vomiting   . Demerol Rash 03/21/2011    Family History  Problem Relation Age of Onset  . Hypertension    . Diabetes    . Colon cancer Neg Hx     History   Social History  . Marital Status: Married    Spouse Name: N/A    Number of Children: N/A  . Years of Education: N/A   Occupational History  . Nurse [employer], was at [employer] for 16 years      Social History Main Topics  . Smoking status: Never Smoker   . Smokeless tobacco: Not on file  . Alcohol Use: No  . Drug Use: No  . Sexually Active: Not on file   Other Topics Concern  . Not on file   Social History Narrative   Lives in [place] with spouse.  3 grown children.  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    Review of Systems: See HPI, otherwise negative ROS   Physical Exam: BP 137/96  Pulse 101  Temp 98.2 F (36.8 C) (Oral)  Resp 17  Ht 5\' 4"  (1.626 m)  Wt 202 lb (91.627 kg)  BMI 34.67 kg/m2  SpO2 95%  LMP 10/28/1999 General:   Alert,  pleasant and cooperative in NAD Head:  Normocephalic and atraumatic. Neck:  Supple;  Lungs:  Clear throughout to auscultation.    Heart:  Regular rate and rhythm. Abdomen:  Soft, nontender and nondistended. Normal bowel sounds, without guarding, and without rebound.   Neurologic:  Alert and  oriented x4;  grossly normal neurologically.  Impression/Plan:     SCREENING  Plan:  1. TCS TODAY

## 2012-07-02 NOTE — Op Note (Signed)
[facility] [address] [place], [address]   COLONOSCOPY PROCEDURE REPORT  PATIENT: Anita, Warren  MR#: [id number] BIRTHDATE: September 11, 1961 , 50  yrs. old GENDER: Female ENDOSCOPIST: Jonette Eva, MD REFERRED AO:ZHYQM Sherwood Gambler, M.D. PROCEDURE DATE:  07/02/2012 PROCEDURE:   Colonoscopy with snare polypectomy INDICATIONS:average risk patient for colon cancer. MEDICATIONS: Demerol 50 mg IV and Versed 5 mg IV  DESCRIPTION OF PROCEDURE:    Physical exam was performed.  Informed consent was obtained from the patient after explaining the benefits, risks, and alternatives to procedure.  The patient was connected to monitor and placed in left lateral position. Continuous oxygen was provided by nasal cannula and IV medicine administered through an indwelling cannula.  After administration of sedation and rectal exam, the patients rectum was intubated and the Pentax Colonoscope [id number]  colonoscope was advanced under direct visualization to the cecum.  The scope was removed slowly by carefully examining the color, texture, anatomy, and integrity mucosa on the way out.  The patient was recovered in endoscopy and discharged home in satisfactory condition.       COLON FINDINGS: A sessile polyp measuring 1.2 cm in size was found in the proximal transverse colon.  A polypectomy was performed using snare cautery.  4 cc SPOT tattoo to base of polypectomy site The resection was complete and the polyp tissue was completely retrieved and Moderate sized internal hemorrhoids were found.  PREP QUALITY: good. CECAL W/D TIME: 15 minutes  COMPLICATIONS: None  ENDOSCOPIC IMPRESSION: Sessile polyp measuring 1.2 cm in size was found in the proximal transverse colon; polypectomy was performed using snare cautery INTERNAL HEMORRHOIDS   RECOMMENDATIONS: 1.  await biopsy results 2.  High fiber diet 3.  repeat Colonoscopy in 5 years. 4.  ALL FIRST DEGREE RELATIVES NEED TCS  AT AGE 31.       _______________________________ eSignedJonette Eva, MD 07/02/2012 2:46 PM     PATIENT NAME:  Anita, Warren MR#: [id number]

## 2012-07-07 ENCOUNTER — Encounter (HOSPITAL_COMMUNITY): Payer: Self-pay | Admitting: Gastroenterology

## 2012-07-08 ENCOUNTER — Telehealth: Payer: Self-pay | Admitting: Gastroenterology

## 2012-07-08 NOTE — Telephone Encounter (Signed)
Returned call, LMOM to call.  

## 2012-07-08 NOTE — Telephone Encounter (Signed)
LM for pt to call

## 2012-07-08 NOTE — Telephone Encounter (Signed)
Pt called this afternoon saying her son gave her a message that someone from office had tried calling her. She thinks its regarding her procedure results. I told her that I would have nurse return her call.

## 2012-07-08 NOTE — Telephone Encounter (Signed)
PLEASE CALL PT. SHE HAD AN INFLAMMATORY POLYP REMOVED.   FOLLOW A HIGH FIBER DIET. AVOID ITEMS THAT CAUSE BLOATING.   Next colonoscopy in 5 years.

## 2012-07-09 ENCOUNTER — Telehealth: Payer: Self-pay | Admitting: *Deleted

## 2012-07-09 NOTE — Telephone Encounter (Signed)
Recall made 

## 2012-07-09 NOTE — Telephone Encounter (Signed)
Anita Warren called today for you. Please call her back. Thanks.

## 2012-07-12 NOTE — Telephone Encounter (Signed)
Pt returned call and was informed.  

## 2012-07-12 NOTE — Telephone Encounter (Signed)
LM for pt to call

## 2012-07-12 NOTE — Telephone Encounter (Signed)
See result note of 07/08/2012. 

## 2012-07-12 NOTE — Telephone Encounter (Signed)
See result note of 07/08/2012.

## 2012-07-29 NOTE — Progress Notes (Signed)
TCS SEP 2013 1.2 CM INFLAM POLYP  REVIEWED.

## 2012-08-16 ENCOUNTER — Other Ambulatory Visit: Payer: Self-pay | Admitting: Cardiology

## 2012-10-04 ENCOUNTER — Encounter: Payer: BC Managed Care – PPO | Admitting: *Deleted

## 2012-10-05 ENCOUNTER — Encounter: Payer: Self-pay | Admitting: *Deleted

## 2012-11-26 ENCOUNTER — Encounter: Payer: Self-pay | Admitting: *Deleted

## 2013-01-19 ENCOUNTER — Encounter: Payer: Self-pay | Admitting: Internal Medicine

## 2013-07-04 ENCOUNTER — Encounter: Payer: Self-pay | Admitting: Internal Medicine

## 2013-07-04 ENCOUNTER — Ambulatory Visit (INDEPENDENT_AMBULATORY_CARE_PROVIDER_SITE_OTHER): Payer: PRIVATE HEALTH INSURANCE | Admitting: Internal Medicine

## 2013-07-04 ENCOUNTER — Ambulatory Visit (INDEPENDENT_AMBULATORY_CARE_PROVIDER_SITE_OTHER)
Admission: RE | Admit: 2013-07-04 | Discharge: 2013-07-04 | Disposition: A | Discharge status: Home / Self Care | Payer: PRIVATE HEALTH INSURANCE | Source: Ambulatory Visit | Attending: Internal Medicine | Admitting: Internal Medicine

## 2013-07-04 VITALS — BP 164/110 | HR 98 | Ht 64.0 in | Wt 206.0 lb

## 2013-07-04 DIAGNOSIS — I5022 Chronic systolic (congestive) heart failure: Secondary | ICD-10-CM

## 2013-07-04 DIAGNOSIS — R05 Cough: Secondary | ICD-10-CM

## 2013-07-04 DIAGNOSIS — I429 Cardiomyopathy, unspecified: Secondary | ICD-10-CM

## 2013-07-04 DIAGNOSIS — I10 Essential (primary) hypertension: Secondary | ICD-10-CM

## 2013-07-04 DIAGNOSIS — I5023 Acute on chronic systolic (congestive) heart failure: Secondary | ICD-10-CM

## 2013-07-04 DIAGNOSIS — R059 Cough, unspecified: Secondary | ICD-10-CM

## 2013-07-04 LAB — CBC WITH DIFFERENTIAL/PLATELET
Basophils Relative: 0.6 % (ref 0.0–3.0)
Eosinophils Relative: 4.6 % (ref 0.0–5.0)
HCT: 34.3 % — ABNORMAL LOW (ref 36.0–46.0)
Lymphs Abs: 2 10*3/uL (ref 0.7–4.0)
MCV: 89.3 fl (ref 78.0–100.0)
Monocytes Absolute: 0.6 10*3/uL (ref 0.1–1.0)
Monocytes Relative: 7.1 % (ref 3.0–12.0)
Neutrophils Relative %: 65.7 % (ref 43.0–77.0)
RBC: 3.84 Mil/uL — ABNORMAL LOW (ref 3.87–5.11)
WBC: 9.1 10*3/uL (ref 4.5–10.5)

## 2013-07-04 LAB — BASIC METABOLIC PANEL
CO2: 27 mEq/L (ref 19–32)
Chloride: 106 mEq/L (ref 96–112)
Sodium: 140 mEq/L (ref 135–145)

## 2013-07-04 LAB — ICD DEVICE OBSERVATION
BRDY-0002RV: 40 {beats}/min
CHARGE TIME: 8.6 s
DEV-0020ICD: NEGATIVE
DEVICE MODEL ICD: 634653
VENTRICULAR PACING ICD: 1 pct

## 2013-07-04 MED ORDER — FUROSEMIDE 40 MG PO TABS: 40.0000 mg | ORAL_TABLET | Freq: Every day | ORAL | Status: DC

## 2013-07-04 NOTE — Patient Instructions (Addendum)
Your physician recommends that you schedule a follow-up appointment in: Dr Wyline Mood in [place] on Wed 9/10 at 9:00am  Remote monitoring is used to monitor your Pacemaker or ICD from home. This monitoring reduces the number of office visits required to check your device to one time per year. It allows [place] to keep an eye on the functioning of your device to ensure it is working properly. You are scheduled for a device check from home on 10/03/13. You may send your transmission at any time that day. If you have a wireless device, the transmission will be sent automatically. After your physician reviews your transmission, you will receive a postcard with your next transmission date.    Your physician wants you to follow-up in: 12 months with Dr Jacquiline Doe will receive a reminder letter in the mail two months in advance. If you don't receive a letter, please call our office to schedule the follow-up appointment.   A chest x-ray takes a picture of the organs and structures inside the chest, including the heart, lungs, and blood vessels. This test can show several things, including, whether the heart is enlarges; whether fluid is building up in the lungs; and whether pacemaker / defibrillator leads are still in place.  Your physician recommends that you return for lab work today BMP/BNP/CBC  Your physician has recommended you make the following change in your medication:  1) Start Furosemide to 40mg  twice daily for 3 days then back to 40mg  daily

## 2013-07-04 NOTE — Progress Notes (Signed)
PCP:  Cassell Smiles., MD Primary Cardiologist:  Previously Dr Daleen Squibb in [place] office  The patient presents today for routine electrophysiology followup.  Since last being seen in our clinic, the patient reports doing reasonably well. Over the past 3 weeks, she has developed progressive SOB. She reports associated orthopnea and is now sleeping in a chair.  She has had a nonproductive cough over the past day.  Today, she denies symptoms of fevers, chills, palpitations, chest pain, lower extremity edema, dizziness, presyncope, syncope, or neurologic sequela.  The patient feels that she is tolerating medications without difficulties and is otherwise without complaint today.   Past Medical History  Diagnosis Date  . 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 HTN (hypertension)   . VT (ventricular tachycardia)     nonsustained  . Hypokalemia   . Obesity   . Pericardial effusion   . Diastolic dysfunction     grade 1   Past Surgical History  Procedure Laterality Date  . Tonsillectomy    . Breast lumpectomy  1989    L breast- benign  . Tubal ligation    . Cholecystectomy    . Cardiac defibrillator placement  5/12    SJM by JA  . Colonoscopy  07/02/2012    Procedure: COLONOSCOPY;  Surgeon: West Bali, MD;  [facility]: [facility];  Service: Endoscopy;  Laterality: N/A;  1:15/PATIENT HAS A DEFIBRILLATOR    Current Outpatient Prescriptions  Medication Sig Dispense Refill  . acetaminophen (TYLENOL) 500 MG tablet Take 1,000 mg by mouth as needed. For pain      . COREG 25 MG tablet TAKE 1 TABLET BY MOUTH TWICE DAILY WITH A MEAL.  60 tablet  5  . hydrALAZINE (APRESOLINE) 100 MG tablet Take 100 mg by mouth 2 (two) times daily.      Marland Kitchen spironolactone (ALDACTONE) 25 MG tablet Take 25 mg by mouth daily.       . furosemide (LASIX) 40 MG tablet Take 1 tablet (40 mg total) by mouth daily.  90 tablet  3   No current facility-administered medications for this visit.     Allergies  Allergen Reactions  . Peanut-Containing Drug Products Anaphylaxis  . Ace Inhibitors Swelling  . Morphine Nausea And Vomiting  . Demerol Rash    History   Social History  . Marital Status: Married    Spouse Name: N/A    Number of Children: N/A  . Years of Education: N/A   Occupational History  . Nurse [employer], was at [employer] for 16 years    Social History Main Topics  . Smoking status: Never Smoker   . Smokeless tobacco: Not on file  . Alcohol Use: No  . Drug Use: No  . Sexual Activity: Not on file   Other Topics Concern  . Not on file   Social History Narrative   Lives in [place] with spouse.  3 grown children.  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    Family History  Problem Relation Age of Onset  . Hypertension    . Diabetes    . Colon cancer Neg Hx     ROS-  All systems are reviewed and are negative except as outlined in the HPI above  Physical Exam: Filed Vitals:   07/04/13 1158  BP: 164/110  Pulse: 98  Height: 5\' 4"  (1.626 m)  Weight: 206 lb (93.441 kg)  GEN- The patient is mildy ill appearing, alert and oriented x 3 today.   Head- normocephalic, atraumatic Eyes-  Sclera clear, conjunctiva pink Ears- hearing intact Oropharynx- clear Neck- supple, no JVP Lymph- no cervical lymphadenopathy Lungs- bibasilar rales with expiratory wheezes and frequent dry cough, normal work of breathing Heart- Regular rate and rhythm, no murmurs, rubs or gallops, PMI not laterally displaced GI- soft, NT, ND, + BS Extremities- no clubbing, cyanosis, or edema MS- no significant deformity or atrophy Skin- no rash or lesion Psych- euthymic mood, full affect Neuro- strength and sensation are intact ICD pocket is well healed  ICD interrogation is reviewed (See paceart) Epic records are reviewed  Assessment and Plan:  1. Acute SOB/ cough Exam is consistent will volume overload.  I think that this is likely due to  acute on chronic systolic dysfunction.  Her CoreView  (SJM ICD hemodynamics parameter) has been elevated for several weeks and corresponds with her symptoms. I will obtain a chest Xray, BMET, and BNP today. Start lasix 40mg  BID x 3 days then 40 mg daily thereafter. She will need close outpatient followup. If symptoms do not improve with diuresis, she will need to consult with her primary care physician to consider bronchitis as another potential cause.  2. HTN Stable No change required today  3. Nonischemic CM As above Normal ICD function See Pace Art report No changes today   Merlin checks every 3 months Return to see me in 1 year I will refer to Dr Wyline Mood for initiation and followup of general cardiology care in [place].

## 2013-07-06 ENCOUNTER — Encounter: Payer: Self-pay | Admitting: Cardiology

## 2013-07-06 ENCOUNTER — Ambulatory Visit (INDEPENDENT_AMBULATORY_CARE_PROVIDER_SITE_OTHER): Payer: PRIVATE HEALTH INSURANCE | Admitting: Cardiology

## 2013-07-06 ENCOUNTER — Encounter: Payer: Self-pay | Admitting: *Deleted

## 2013-07-06 VITALS — BP 148/100 | HR 109 | Ht 64.0 in | Wt 200.1 lb

## 2013-07-06 DIAGNOSIS — I5023 Acute on chronic systolic (congestive) heart failure: Secondary | ICD-10-CM

## 2013-07-06 DIAGNOSIS — I429 Cardiomyopathy, unspecified: Secondary | ICD-10-CM

## 2013-07-06 NOTE — Progress Notes (Signed)
Clinical Summary [name] is a 52 y.o.female  1. NICM: EF 20% March 2012,no significant CAD by cath 01/2011 - hx of ACE-I angioedema - seen by Dr Anita Warren 07/04/13 in clinic w/ signs and symptoms of volume overload (increaesd DOE, 2-->4 pillow orthopnea), her ICD hemodynamic monitoring also indicated volume overload. Compliant w/ meds, notes some dietary indiscretion. No chest pain, no palpitations. ICD interrogation per notes showed no significant events/arrythmias. Increased cough, + nasal congestion, voice hoarseness, no fevers or chills. Not taking pseodofed. Around granddaugheter who had cold recently. No recent hospitalziations since 2011, states no recent need for increased diuretic until recently - - started on lasix 40 mg bid x 3 days, then 40mg  daily and arranged f/u today. Notes 50% improvement in her breathing, less abdominal distension. Wt down from 206-->200 lbs - CXR showed clear lung fields, cardiomegaly -normal ICD function 07/04/13 per interrogation    Past Medical History  Diagnosis Date  . 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 HTN (hypertension)   . VT (ventricular tachycardia)     nonsustained  . Hypokalemia   . Obesity   . Pericardial effusion   . Diastolic dysfunction     grade 1     Allergies  Allergen Reactions  . Peanut-Containing Drug Products Anaphylaxis  . Ace Inhibitors Swelling  . Morphine Nausea And Vomiting  . Demerol Rash     Current Outpatient Prescriptions  Medication Sig Dispense Refill  . acetaminophen (TYLENOL) 500 MG tablet Take 1,000 mg by mouth as needed. For pain      . COREG 25 MG tablet TAKE 1 TABLET BY MOUTH TWICE DAILY WITH A MEAL.  60 tablet  5  . furosemide (LASIX) 40 MG tablet Take 1 tablet (40 mg total) by mouth daily.  90 tablet  3  . hydrALAZINE (APRESOLINE) 100 MG tablet Take 100 mg by mouth 2 (two) times daily.      Marland Kitchen spironolactone (ALDACTONE) 25 MG tablet Take 25 mg by mouth daily.         No current facility-administered medications for this visit.   Isosorbide 30 mg bid  Past Surgical History  Procedure Laterality Date  . Tonsillectomy    . Breast lumpectomy  1989    L breast- benign  . Tubal ligation    . Cholecystectomy    . Cardiac defibrillator placement  5/12    SJM by JA  . Colonoscopy  07/02/2012    Procedure: COLONOSCOPY;  Surgeon: Anita Bali, MD;  Location: [facility];  Service: Endoscopy;  Laterality: N/A;  1:15/PATIENT HAS A DEFIBRILLATOR     Allergies  Allergen Reactions  . Peanut-Containing Drug Products Anaphylaxis  . Ace Inhibitors Swelling  . Morphine Nausea And Vomiting  . Demerol Rash      Family History  Problem Relation Age of Onset  . Hypertension    . Diabetes    . Colon cancer Neg Hx      Social History [name] reports that she has never smoked. She does not have any smokeless tobacco history on file. [name] reports that she does not drink alcohol.   Review of Systems 12 point ROS negative other than reported in HPI  Physical Examination p 96 bp 148/100 (has not taken meds today_ Gen: NAD HEENT: sclera clear, pupils equal round and reactive Pulm: CTAB Abd: soft, NT, ND Ext: warm, no edema   Diagnostic Studies 09/2010 Echo: LVEF 10-15%, severe global hypokinesis,  dilated LV, severe LAE, grade I diastolic dysfunction, mild AI, mild MR, severe TR, dilated RV   07/04/13 CXR: clear lungs, cardiomegaly  Pertinent labs 07/04/13: Hgb 11.4 Hct 34.3 WBC 9.1 Plt 202 BNP 995 (prior 610) Na 140 K 3.7 Cr 0.9 BUN 16   Assessment and Plan  1. NICM: w/ ADSHF, likely exacerbating factor diet as well as recent viral URI.  - symptoms improving after increased diuretic dose, will give her an additional 40mg  bid x 2 days then resume 40mg  daily as she still complains of some DOE and abdominal distension. Clear cxr in setting of volume overload not uncommon in long term chronic systolic heart failure due to increased  lymphatics.  - continue current medications, she is not on ACE-I due to history of angioedema. Consider to increased hydral/isordil to tid after future visits.  - repeat echo, last study over 2 years ago. Her bivenricular function may have worsened, or previously noted valvular pathology may have progressed - f/u 1 week to reevaluate symptoms w/ lab check. I have called in KCl for her , instructed her to take 2 doses.  2. Viral Anita Warren - symptoms of productive cough, sore throat, hoarseness and nasal congestion x 4 days. Recent sick contact. Counseled to avoid decongestants, specifcally phenylephrine or pseudoephredine.     Anita Warren, M.D., F.A.C.C.

## 2013-07-06 NOTE — Patient Instructions (Addendum)
Your physician recommends that you schedule a follow-up appointment in: NEXT WEEK WITH DR [facility]  Your physician has requested that you have an echocardiogram. Echocardiography is a painless test that uses sound waves to create images of your heart. It provides your doctor with information about the size and shape of your heart and how well your heart's chambers and valves are working. This procedure takes approximately one hour. There are no restrictions for this procedure.WE WILL CALL YOU WITH THE RESULTS

## 2013-07-12 ENCOUNTER — Ambulatory Visit (HOSPITAL_COMMUNITY)
Admission: RE | Admit: 2013-07-12 | Discharge: 2013-07-12 | Disposition: A | Discharge status: Home / Self Care | Payer: PRIVATE HEALTH INSURANCE | Source: Ambulatory Visit | Attending: Cardiology | Admitting: Cardiology

## 2013-07-12 DIAGNOSIS — I319 Disease of pericardium, unspecified: Secondary | ICD-10-CM

## 2013-07-12 DIAGNOSIS — I10 Essential (primary) hypertension: Secondary | ICD-10-CM | POA: Insufficient documentation

## 2013-07-12 DIAGNOSIS — I429 Cardiomyopathy, unspecified: Secondary | ICD-10-CM

## 2013-07-12 DIAGNOSIS — E669 Obesity, unspecified: Secondary | ICD-10-CM | POA: Insufficient documentation

## 2013-07-12 DIAGNOSIS — I5023 Acute on chronic systolic (congestive) heart failure: Secondary | ICD-10-CM

## 2013-07-12 DIAGNOSIS — I428 Other cardiomyopathies: Secondary | ICD-10-CM | POA: Insufficient documentation

## 2013-07-12 DIAGNOSIS — Z6834 Body mass index (BMI) 34.0-34.9, adult: Secondary | ICD-10-CM | POA: Insufficient documentation

## 2013-07-12 NOTE — Progress Notes (Signed)
*  PRELIMINARY RESULTS* Echocardiogram 2D Echocardiogram has been performed.  Anita Warren 07/12/2013, 12:10 PM

## 2013-07-15 ENCOUNTER — Ambulatory Visit (INDEPENDENT_AMBULATORY_CARE_PROVIDER_SITE_OTHER): Payer: PRIVATE HEALTH INSURANCE | Admitting: Cardiology

## 2013-07-15 VITALS — BP 141/91 | HR 110 | Ht 64.0 in | Wt 203.8 lb

## 2013-07-15 DIAGNOSIS — I5022 Chronic systolic (congestive) heart failure: Secondary | ICD-10-CM

## 2013-07-15 DIAGNOSIS — I10 Essential (primary) hypertension: Secondary | ICD-10-CM

## 2013-07-15 LAB — BASIC METABOLIC PANEL
CO2: 30 mEq/L (ref 19–32)
Calcium: 9.2 mg/dL (ref 8.4–10.5)
Creat: 1.13 mg/dL — ABNORMAL HIGH (ref 0.50–1.10)
Sodium: 141 mEq/L (ref 135–145)

## 2013-07-15 NOTE — Patient Instructions (Addendum)
Your physician recommends that you schedule a follow-up appointment in: 3 MONTHS  Your physician recommends that you return for lab work in: TODAY (SLIPS GIVEN FOR BMP)

## 2013-07-15 NOTE — Progress Notes (Signed)
Clinical Summary Ms. Ephriam Knuckles is a 52 y.o.female 1. NICM: EF 20% March 2012,no significant CAD by cath 01/2011  - hx of ACE-I angioedema  - seen by Dr Johney Frame 07/04/13 in clinic w/ signs and symptoms of volume overload (increaesd DOE, 2-->4 pillow orthopnea), her ICD hemodynamic monitoring also indicated volume overload. Compliant w/ meds, notes some dietary indiscretion. He increased her lasix and she followed up with me last week. Was feeling better then, but not back to her baseline.  - took lasix 40mg  bid for an additional 2 days after our last visit, now back to once a day.  - reports breathing much improved, DOE improved. Denies any lightheadedness or dizziness.   Past Medical History  Diagnosis Date  . 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 HTN (hypertension)   . VT (ventricular tachycardia)     nonsustained  . Hypokalemia   . Obesity   . Pericardial effusion   . Diastolic dysfunction     grade 1     Allergies  Allergen Reactions  . Peanut-Containing Drug Products Anaphylaxis  . Ace Inhibitors Swelling  . Morphine Nausea And Vomiting  . Demerol Rash     Current Outpatient Prescriptions  Medication Sig Dispense Refill  . acetaminophen (TYLENOL) 500 MG tablet Take 1,000 mg by mouth as needed. For pain      . COREG 25 MG tablet TAKE 1 TABLET BY MOUTH TWICE DAILY WITH A MEAL.  60 tablet  5  . furosemide (LASIX) 40 MG tablet Take 1 tablet (40 mg total) by mouth daily.  90 tablet  3  . hydrALAZINE (APRESOLINE) 100 MG tablet Take 100 mg by mouth 2 (two) times daily.      Marland Kitchen spironolactone (ALDACTONE) 25 MG tablet Take 25 mg by mouth daily.        No current facility-administered medications for this visit.     Past Surgical History  Procedure Laterality Date  . Tonsillectomy    . Breast lumpectomy  1989    L breast- benign  . Tubal ligation    . Cholecystectomy    . Cardiac defibrillator placement  5/12    SJM by JA  . Colonoscopy  07/02/2012     Procedure: COLONOSCOPY;  Surgeon: West Bali, MD;  [facility]: [facility];  Service: Endoscopy;  Laterality: N/A;  1:15/PATIENT HAS A DEFIBRILLATOR     Allergies  Allergen Reactions  . Peanut-Containing Drug Products Anaphylaxis  . Ace Inhibitors Swelling  . Morphine Nausea And Vomiting  . Demerol Rash      Family History  Problem Relation Age of Onset  . Hypertension    . Diabetes    . Colon cancer Neg Hx      Social History Ms. Ephriam Knuckles reports that she has never smoked. She does not have any smokeless tobacco history on file. Ms. Ephriam Knuckles reports that she does not drink alcohol.   Review of Systems 12 point ROS negative other than reported in HPI  Physical Examination p 94 bp 141/91 Wt 203 lbs BMI 35 Gen: resting comfortably, NAD HEENT: no scleral icterus, pupils equal round and reactive, no palptable cervical adenopathy CV Pulm: CTAB Abd: soft, NT, ND NABS, no hepatosplenomegaly Ext: warm, no edema.  Skin: warm, no rash Neuro: A&Ox3, no focal deficits    Diagnostic Studies 07/12/13 Echo: LVEF 10-20%, LV mod dilated, mild LVH, restrictive diastolic dysfunction, mild MR, severe LAE, mod RV dysfunction, PASP 37  09/2010 Echo:  LVEF 10-15%, severe global hypokinesis, dilated LV, severe LAE, grade I diastolic dysfunction, mild AI, mild MR, severe TR, dilated RV  07/04/13 CXR: clear lungs, cardiomegaly   Pertinent labs  07/04/13: Hgb 11.4 Hct 34.3 WBC 9.1 Plt 202 BNP 995 (prior 610) Na 140 K 3.7 Cr 0.9 BUN 16   Assessment and Plan   1. NICM: recent ADSHF, likely exacerbating factor diet as well as recent viral URI. At baseline she is NYHA II-III. - symptoms improved w/ increased oral diuretic, now back to 40mg  once a day. Her cold has also resolved. Repeat echo pretty much unchanged from prior study. Counseled on signs/symptoms to watch for regarding dehydration, educated on decreasing or increasing lasix at home as needed based on symptoms and weight. -  will check a BMP today.   - continue current meds, likely titrate hydral/isordil to tid at next visit pending bp - f/u 3 months     Antoine Poche, M.D., F.A.C.C.

## 2013-07-18 ENCOUNTER — Encounter: Payer: Self-pay | Admitting: *Deleted

## 2013-08-01 ENCOUNTER — Encounter: Payer: Self-pay | Admitting: Internal Medicine

## 2013-09-03 ENCOUNTER — Encounter (HOSPITAL_COMMUNITY): Payer: Self-pay | Admitting: Emergency Medicine

## 2013-09-03 ENCOUNTER — Emergency Department (HOSPITAL_COMMUNITY)
Admission: EM | Admit: 2013-09-03 | Discharge: 2013-09-03 | Disposition: A | Discharge status: Home / Self Care | Payer: PRIVATE HEALTH INSURANCE | Attending: Emergency Medicine | Admitting: Emergency Medicine

## 2013-09-03 ENCOUNTER — Emergency Department (HOSPITAL_COMMUNITY): Payer: PRIVATE HEALTH INSURANCE

## 2013-09-03 DIAGNOSIS — Z9851 Tubal ligation status: Secondary | ICD-10-CM | POA: Insufficient documentation

## 2013-09-03 DIAGNOSIS — E669 Obesity, unspecified: Secondary | ICD-10-CM | POA: Insufficient documentation

## 2013-09-03 DIAGNOSIS — D649 Anemia, unspecified: Secondary | ICD-10-CM | POA: Insufficient documentation

## 2013-09-03 DIAGNOSIS — N201 Calculus of ureter: Secondary | ICD-10-CM | POA: Insufficient documentation

## 2013-09-03 DIAGNOSIS — Z9089 Acquired absence of other organs: Secondary | ICD-10-CM | POA: Insufficient documentation

## 2013-09-03 DIAGNOSIS — Z79899 Other long term (current) drug therapy: Secondary | ICD-10-CM | POA: Insufficient documentation

## 2013-09-03 DIAGNOSIS — I10 Essential (primary) hypertension: Secondary | ICD-10-CM | POA: Insufficient documentation

## 2013-09-03 DIAGNOSIS — Z8679 Personal history of other diseases of the circulatory system: Secondary | ICD-10-CM | POA: Insufficient documentation

## 2013-09-03 DIAGNOSIS — Z95810 Presence of automatic (implantable) cardiac defibrillator: Secondary | ICD-10-CM | POA: Insufficient documentation

## 2013-09-03 DIAGNOSIS — E876 Hypokalemia: Secondary | ICD-10-CM | POA: Insufficient documentation

## 2013-09-03 LAB — CBC WITH DIFFERENTIAL/PLATELET
HCT: 35.6 % — ABNORMAL LOW (ref 36.0–46.0)
Hemoglobin: 11.8 g/dL — ABNORMAL LOW (ref 12.0–15.0)
Lymphocytes Relative: 40 % (ref 12–46)
Lymphs Abs: 3.9 10*3/uL (ref 0.7–4.0)
MCHC: 33.1 g/dL (ref 30.0–36.0)
Monocytes Absolute: 0.5 10*3/uL (ref 0.1–1.0)
Monocytes Relative: 5 % (ref 3–12)
Neutro Abs: 4.8 10*3/uL (ref 1.7–7.7)
Neutrophils Relative %: 50 % (ref 43–77)
RBC: 3.95 MIL/uL (ref 3.87–5.11)
WBC: 9.7 10*3/uL (ref 4.0–10.5)

## 2013-09-03 LAB — URINALYSIS, ROUTINE W REFLEX MICROSCOPIC
Bilirubin Urine: NEGATIVE
Glucose, UA: NEGATIVE mg/dL
Ketones, ur: NEGATIVE mg/dL
Leukocytes, UA: NEGATIVE
Nitrite: NEGATIVE
Protein, ur: NEGATIVE mg/dL
pH: 5.5 (ref 5.0–8.0)

## 2013-09-03 LAB — LIPASE, BLOOD: Lipase: 25 U/L (ref 11–59)

## 2013-09-03 LAB — COMPREHENSIVE METABOLIC PANEL
Alkaline Phosphatase: 42 U/L (ref 39–117)
BUN: 24 mg/dL — ABNORMAL HIGH (ref 6–23)
CO2: 23 mEq/L (ref 19–32)
Chloride: 102 mEq/L (ref 96–112)
Creatinine, Ser: 1.09 mg/dL (ref 0.50–1.10)
GFR calc non Af Amer: 57 mL/min — ABNORMAL LOW (ref >90)
Potassium: 3.1 mEq/L — ABNORMAL LOW (ref 3.5–5.1)
Total Bilirubin: 0.6 mg/dL (ref 0.3–1.2)

## 2013-09-03 LAB — URINE MICROSCOPIC-ADD ON

## 2013-09-03 MED ORDER — METOCLOPRAMIDE HCL 5 MG/ML IJ SOLN
10.0000 mg | Freq: Once | INTRAMUSCULAR | Status: AC
  Administered 2013-09-03: 10 mg via INTRAVENOUS
  Filled 2013-09-03: qty 2

## 2013-09-03 MED ORDER — HYDROMORPHONE HCL PF 1 MG/ML IJ SOLN
1.0000 mg | Freq: Once | INTRAMUSCULAR | Status: AC
  Administered 2013-09-03: 1 mg via INTRAVENOUS
  Filled 2013-09-03: qty 1

## 2013-09-03 MED ORDER — ONDANSETRON HCL 4 MG/2ML IJ SOLN
4.0000 mg | Freq: Once | INTRAMUSCULAR | Status: AC
  Administered 2013-09-03: 4 mg via INTRAVENOUS

## 2013-09-03 MED ORDER — KETOROLAC TROMETHAMINE 30 MG/ML IJ SOLN
30.0000 mg | Freq: Once | INTRAMUSCULAR | Status: AC
  Administered 2013-09-03: 30 mg via INTRAVENOUS
  Filled 2013-09-03: qty 1

## 2013-09-03 MED ORDER — METOCLOPRAMIDE HCL 10 MG PO TABS: 10.0000 mg | ORAL_TABLET | Freq: Four times a day (QID) | ORAL | Status: DC | PRN

## 2013-09-03 MED ORDER — DIPHENHYDRAMINE HCL 50 MG/ML IJ SOLN
25.0000 mg | Freq: Once | INTRAMUSCULAR | Status: AC
  Administered 2013-09-03: 25 mg via INTRAVENOUS
  Filled 2013-09-03: qty 1

## 2013-09-03 MED ORDER — POTASSIUM CHLORIDE CRYS ER 20 MEQ PO TBCR
40.0000 meq | EXTENDED_RELEASE_TABLET | Freq: Once | ORAL | Status: AC
  Administered 2013-09-03: 40 meq via ORAL
  Filled 2013-09-03: qty 2

## 2013-09-03 MED ORDER — ONDANSETRON HCL 4 MG/2ML IJ SOLN
INTRAMUSCULAR | Status: AC
  Filled 2013-09-03: qty 2

## 2013-09-03 MED ORDER — HYDROMORPHONE HCL 2 MG PO TABS: 2.0000 mg | ORAL_TABLET | ORAL | Status: DC | PRN

## 2013-09-03 MED ORDER — SODIUM CHLORIDE 0.9 % IV SOLN
Freq: Once | INTRAVENOUS | Status: AC
  Administered 2013-09-03: 04:00:00 via INTRAVENOUS

## 2013-09-03 MED ORDER — ONDANSETRON HCL 4 MG/2ML IJ SOLN: 4.0000 mg | Freq: Once | INTRAMUSCULAR | Status: DC

## 2013-09-03 MED ORDER — POTASSIUM CHLORIDE ER 10 MEQ PO TBCR: 10.0000 meq | EXTENDED_RELEASE_TABLET | Freq: Every day | ORAL | Status: DC

## 2013-09-03 NOTE — ED Notes (Signed)
Anita Warren with [facility] called for pt update. Reporting possible placement at [facility].

## 2013-09-03 NOTE — ED Provider Notes (Signed)
CSN: [id number]     Arrival date & time 09/03/13  0419 History   First MD Initiated Contact with Patient 09/03/13 0422     Chief Complaint  Patient presents with  . Flank Pain   (Consider location/radiation/quality/duration/timing/severity/associated sxs/prior Treatment) Patient is a 52 y.o. female presenting with flank pain. The history is provided by the patient.  Flank Pain  She had onset at 0200 of severe right flank pain with radiation to the right lower quadrant. Pain is rated at 10/10. She describes it as a tight feeling. There is associated nausea and vomiting. No fever or chills. No urinary difficulty. Nothing makes it better, nothig makes it worse.  Past Medical History  Diagnosis Date  . 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 HTN (hypertension)   . VT (ventricular tachycardia)     nonsustained  . Hypokalemia   . Obesity   . Pericardial effusion   . Diastolic dysfunction     grade 1   Past Surgical History  Procedure Laterality Date  . Tonsillectomy    . Breast lumpectomy  1989    L breast- benign  . Tubal ligation    . Cholecystectomy    . Cardiac defibrillator placement  5/12    SJM by JA  . Colonoscopy  07/02/2012    Procedure: COLONOSCOPY;  Surgeon: West Bali, MD;  Location: [facility];  Service: Endoscopy;  Laterality: N/A;  1:15/PATIENT HAS A DEFIBRILLATOR   Family History  Problem Relation Age of Onset  . Hypertension    . Diabetes    . Colon cancer Neg Hx    History  Substance Use Topics  . Smoking status: Never Smoker   . Smokeless tobacco: Not on file  . Alcohol Use: No   OB History   Grav Para Term Preterm Abortions TAB SAB Ect Mult Living                 Review of Systems  Genitourinary: Positive for flank pain.  All other systems reviewed and are negative.    Allergies  Peanut-containing drug products; Ace inhibitors; Morphine; and Demerol  Home Medications   Current Outpatient Rx  Name  Route  Sig   Dispense  Refill  . acetaminophen (TYLENOL) 500 MG tablet   Oral   Take 1,000 mg by mouth as needed. For pain         . COREG 25 MG tablet      TAKE 1 TABLET BY MOUTH TWICE DAILY WITH A MEAL.   60 tablet   5   . furosemide (LASIX) 40 MG tablet   Oral   Take 1 tablet (40 mg total) by mouth daily.   90 tablet   3   . hydrALAZINE (APRESOLINE) 100 MG tablet   Oral   Take 100 mg by mouth 2 (two) times daily.         Marland Kitchen EXPIRED: isosorbide dinitrate (ISORDIL) 30 MG tablet   Oral   Take 1 tablet (30 mg total) by mouth 2 (two) times daily.   30 tablet   0   . isosorbide dinitrate (ISORDIL) 30 MG tablet   Oral   Take 30 mg by mouth 4 (four) times daily.         Marland Kitchen spironolactone (ALDACTONE) 25 MG tablet   Oral   Take 25 mg by mouth daily.           BP 136/85  Pulse 90  Temp(Src) 97.8 F (36.6 C) (Oral)  Resp 20  Ht 5\' 4"  (1.626 m)  Wt 206 lb (93.441 kg)  BMI 35.34 kg/m2  SpO2 94%  LMP 10/28/1999 Physical Exam  Nursing note and vitals reviewed.  52 year old female, in obvious pain. Vital signs are normal. Oxygen saturation is 94%, which is normal. Head is normocephalic and atraumatic. PERRLA, EOMI. Oropharynx is clear. Neck is nontender and supple without adenopathy or JVD. Back is nontender in the midline, there is moderate right CVA tenderness. Lungs are clear without rales, wheezes, or rhonchi. Chest is nontender. Heart has regular rate and rhythm without murmur. Abdomen is soft, flat,with mild right lower quadrant tenderness. There is no rebound tenderness or guarding. masses or hepatosplenomegaly and peristalsis is hypoactive. Extremities have no cyanosis or edema, full range of motion is present. Skin is warm and dry without rash. Neurologic: Mental status is normal, cranial nerves are intact, there are no motor or sensory deficits.  ED Course  Procedures (including critical care time) Labs Review Results for orders placed during the hospital  encounter of 09/03/13  CBC WITH DIFFERENTIAL      Result Value Range   WBC 9.7  4.0 - 10.5 K/uL   RBC 3.95  3.87 - 5.11 MIL/uL   Hemoglobin 11.8 (*) 12.0 - 15.0 g/dL   HCT 40.9 (*) 81.1 - 91.4 %   MCV 90.1  78.0 - 100.0 fL   MCH 29.9  26.0 - 34.0 pg   MCHC 33.1  30.0 - 36.0 g/dL   RDW 78.2  95.6 - 21.3 %   Platelets 215  150 - 400 K/uL   Neutrophils Relative % 50  43 - 77 %   Neutro Abs 4.8  1.7 - 7.7 K/uL   Lymphocytes Relative 40  12 - 46 %   Lymphs Abs 3.9  0.7 - 4.0 K/uL   Monocytes Relative 5  3 - 12 %   Monocytes Absolute 0.5  0.1 - 1.0 K/uL   Eosinophils Relative 4  0 - 5 %   Eosinophils Absolute 0.4  0.0 - 0.7 K/uL   Basophils Relative 1  0 - 1 %   Basophils Absolute 0.1  0.0 - 0.1 K/uL  COMPREHENSIVE METABOLIC PANEL      Result Value Range   Sodium 139  135 - 145 mEq/L   Potassium 3.1 (*) 3.5 - 5.1 mEq/L   Chloride 102  96 - 112 mEq/L   CO2 23  19 - 32 mEq/L   Glucose, Bld 180 (*) 70 - 99 mg/dL   BUN 24 (*) 6 - 23 mg/dL   Creatinine, Ser 0.86  0.50 - 1.10 mg/dL   Calcium 9.1  8.4 - 57.8 mg/dL   Total Protein 7.4  6.0 - 8.3 g/dL   Albumin 3.5  3.5 - 5.2 g/dL   AST 23  0 - 37 U/L   ALT 35  0 - 35 U/L   Alkaline Phosphatase 42  39 - 117 U/L   Total Bilirubin 0.6  0.3 - 1.2 mg/dL   GFR calc non Af Amer 57 (*) >90 mL/min   GFR calc Af Amer 66 (*) >90 mL/min  LIPASE, BLOOD      Result Value Range   Lipase 25  11 - 59 U/L  URINALYSIS, ROUTINE W REFLEX MICROSCOPIC      Result Value Range   Color, Urine YELLOW  YELLOW   APPearance HAZY (*) CLEAR   Specific Gravity, Urine >1.030 (*)  1.005 - 1.030   pH 5.5  5.0 - 8.0   Glucose, UA NEGATIVE  NEGATIVE mg/dL   Hgb urine dipstick LARGE (*) NEGATIVE   Bilirubin Urine NEGATIVE  NEGATIVE   Ketones, ur NEGATIVE  NEGATIVE mg/dL   Protein, ur NEGATIVE  NEGATIVE mg/dL   Urobilinogen, UA 0.2  0.0 - 1.0 mg/dL   Nitrite NEGATIVE  NEGATIVE   Leukocytes, UA NEGATIVE  NEGATIVE  URINE MICROSCOPIC-ADD ON      Result Value Range    Squamous Epithelial / LPF FEW (*) RARE   WBC, UA 3-6  <3 WBC/hpf   RBC / HPF 21-50  <3 RBC/hpf   Bacteria, UA MANY (*) RARE   Urine-Other MUCOUS PRESENT     Imaging Review Ct Abdomen Pelvis Wo Contrast  09/03/2013   CLINICAL DATA:  Right flank pain, nausea vomiting  EXAM: CT ABDOMEN AND PELVIS WITHOUT CONTRAST  TECHNIQUE: Multidetector CT imaging of the abdomen and pelvis was performed following the standard protocol without intravenous contrast.  COMPARISON:  Prior CT from 10/06/2010  FINDINGS: Marked did cardiomegaly is similar as compared to the prior exam. Pacemaker electrodes are partially visualized. There is a small pericardial effusion, also similar to prior.  Visualized lung bases are clear.  Limited noncontrast evaluation of the liver is unremarkable. Gallbladder is surgically absent. Spleen, adrenal glands, and pancreas demonstrate a normal unenhanced appearance.  Multiple obstructive stones are seen within the proximal -mid right ureter. The 2 main stones measure 1.1 and 1 cm respectively (series 2 image 40, 45). There is moderate right-sided hydronephrosis and hydroureter. The right ureter somewhat patulous along its entire course, similar to prior.  Left kidney is within normal limits without evidence of nephrolithiasis or hydronephrosis. No stones are seen along the course of the left renal collecting system.  There is no evidence of bowel obstruction. No inflammatory changes are seen about the small or large bowel. Appendix is normal.  Bladder is largely decompressed and not well evaluated. Prostate is within normal limits.  No free air or fluid. No enlarged intra-abdominal pelvic lymph nodes. Prominent calcified atheromatous disease noted within the intra-abdominal aorta and its branch vessels.  IMPRESSION: 1. Obstructive stones within the proximal-mid right ureter measuring up to 1.1 cm with secondary moderate right hydronephrosis and hydroureter. 2. Normal appendix. Prior  cholecystectomy. 3. Cardiomegaly with small pericardial effusion, similar to prior.   Electronically Signed   By: Rise Mu M.D.   On: 09/03/2013 05:45   Images viewed by me.  MDM   1. Ureterolithiasis   2. Anemia   3. Hypokalemia    Right flank pain which is consistent with ureteral colic. Review of prior records shows she had a CT scan in 2011 which showed a 1 cm right renal calculus. She also has a nonischemic cardiomyopathy with EF 10-20%. She will be given ketorolac, hydromorphone, and ondansetron. CT will be obtained.  She required two doses of hydromorphone to get pain relief. She continued to vomit in spine of ondansetron, so she was given metoclopramide with good relief of nausea. She is referred to urology, and sent home with prescriptions for hydromorphone and metoclopramide. Hypokalemia was treated with -Dur, and she is given a prescription for same.  Dione Booze, MD 09/03/13 [phone]

## 2013-09-03 NOTE — ED Notes (Signed)
Patient c/o right flank pain and vomiting since 2am.

## 2013-09-03 NOTE — ED Notes (Signed)
Up to bathroom, voided small amt, became nauseated. Back to bed,wretching. ermd aware

## 2013-09-04 LAB — URINE CULTURE
Colony Count: NO GROWTH
Culture: NO GROWTH

## 2013-09-05 ENCOUNTER — Inpatient Hospital Stay (HOSPITAL_COMMUNITY)
Admission: EM | Admit: 2013-09-05 | Discharge: 2013-09-07 | DRG: 690 | Disposition: A | Discharge status: Home / Self Care | Payer: PRIVATE HEALTH INSURANCE | Attending: Internal Medicine | Admitting: Internal Medicine

## 2013-09-05 ENCOUNTER — Encounter (HOSPITAL_COMMUNITY): Payer: Self-pay | Admitting: Emergency Medicine

## 2013-09-05 ENCOUNTER — Inpatient Hospital Stay (HOSPITAL_COMMUNITY): Payer: PRIVATE HEALTH INSURANCE

## 2013-09-05 DIAGNOSIS — N1330 Unspecified hydronephrosis: Secondary | ICD-10-CM | POA: Diagnosis present

## 2013-09-05 DIAGNOSIS — D649 Anemia, unspecified: Secondary | ICD-10-CM | POA: Diagnosis present

## 2013-09-05 DIAGNOSIS — N139 Obstructive and reflux uropathy, unspecified: Secondary | ICD-10-CM | POA: Diagnosis present

## 2013-09-05 DIAGNOSIS — E669 Obesity, unspecified: Secondary | ICD-10-CM

## 2013-09-05 DIAGNOSIS — N390 Urinary tract infection, site not specified: Secondary | ICD-10-CM

## 2013-09-05 DIAGNOSIS — N201 Calculus of ureter: Secondary | ICD-10-CM | POA: Diagnosis present

## 2013-09-05 DIAGNOSIS — Z833 Family history of diabetes mellitus: Secondary | ICD-10-CM

## 2013-09-05 DIAGNOSIS — I5023 Acute on chronic systolic (congestive) heart failure: Secondary | ICD-10-CM

## 2013-09-05 DIAGNOSIS — Z79899 Other long term (current) drug therapy: Secondary | ICD-10-CM

## 2013-09-05 DIAGNOSIS — I429 Cardiomyopathy, unspecified: Secondary | ICD-10-CM | POA: Diagnosis present

## 2013-09-05 DIAGNOSIS — N132 Hydronephrosis with renal and ureteral calculous obstruction: Secondary | ICD-10-CM

## 2013-09-05 DIAGNOSIS — Z888 Allergy status to other drugs, medicaments and biological substances status: Secondary | ICD-10-CM

## 2013-09-05 DIAGNOSIS — Z1211 Encounter for screening for malignant neoplasm of colon: Secondary | ICD-10-CM

## 2013-09-05 DIAGNOSIS — I428 Other cardiomyopathies: Secondary | ICD-10-CM | POA: Diagnosis present

## 2013-09-05 DIAGNOSIS — Z885 Allergy status to narcotic agent status: Secondary | ICD-10-CM

## 2013-09-05 DIAGNOSIS — Z91010 Allergy to peanuts: Secondary | ICD-10-CM

## 2013-09-05 DIAGNOSIS — I10 Essential (primary) hypertension: Secondary | ICD-10-CM

## 2013-09-05 DIAGNOSIS — N10 Acute tubulo-interstitial nephritis: Principal | ICD-10-CM

## 2013-09-05 DIAGNOSIS — E876 Hypokalemia: Secondary | ICD-10-CM | POA: Diagnosis present

## 2013-09-05 DIAGNOSIS — N289 Disorder of kidney and ureter, unspecified: Secondary | ICD-10-CM

## 2013-09-05 HISTORY — DX: Hydronephrosis with renal and ureteral calculous obstruction: N13.2

## 2013-09-05 LAB — CBC WITH DIFFERENTIAL/PLATELET
Basophils Absolute: 0.1 10*3/uL (ref 0.0–0.1)
Basophils Relative: 1 % (ref 0–1)
Eosinophils Absolute: 0.2 10*3/uL (ref 0.0–0.7)
HCT: 36.5 % (ref 36.0–46.0)
Hemoglobin: 12.2 g/dL (ref 12.0–15.0)
MCH: 30 pg (ref 26.0–34.0)
MCHC: 33.4 g/dL (ref 30.0–36.0)
MCV: 89.7 fL (ref 78.0–100.0)
Monocytes Absolute: 0.9 10*3/uL (ref 0.1–1.0)
Monocytes Relative: 10 % (ref 3–12)
Neutro Abs: 3.8 10*3/uL (ref 1.7–7.7)
Platelets: 225 10*3/uL (ref 150–400)

## 2013-09-05 LAB — BASIC METABOLIC PANEL
BUN: 16 mg/dL (ref 6–23)
CO2: 24 mEq/L (ref 19–32)
Chloride: 100 mEq/L (ref 96–112)
Glucose, Bld: 170 mg/dL — ABNORMAL HIGH (ref 70–99)
Potassium: 3.3 mEq/L — ABNORMAL LOW (ref 3.5–5.1)

## 2013-09-05 LAB — URINE MICROSCOPIC-ADD ON

## 2013-09-05 LAB — URINALYSIS, ROUTINE W REFLEX MICROSCOPIC
Glucose, UA: NEGATIVE mg/dL
Specific Gravity, Urine: >1.03 — ABNORMAL HIGH (ref 1.005–1.030)
pH: 6 (ref 5.0–8.0)

## 2013-09-05 MED ORDER — ISOSORBIDE DINITRATE 20 MG PO TABS
30.0000 mg | ORAL_TABLET | Freq: Two times a day (BID) | ORAL | Status: DC
  Administered 2013-09-05 – 2013-09-07 (×5): 30 mg via ORAL
  Filled 2013-09-05 (×5): qty 2

## 2013-09-05 MED ORDER — CARVEDILOL 12.5 MG PO TABS
25.0000 mg | ORAL_TABLET | Freq: Two times a day (BID) | ORAL | Status: DC
  Administered 2013-09-05 – 2013-09-07 (×5): 25 mg via ORAL
  Filled 2013-09-05 (×7): qty 2

## 2013-09-05 MED ORDER — ONDANSETRON HCL 4 MG/2ML IJ SOLN
4.0000 mg | Freq: Once | INTRAMUSCULAR | Status: AC
  Administered 2013-09-05: 4 mg via INTRAVENOUS
  Filled 2013-09-05: qty 2

## 2013-09-05 MED ORDER — METOCLOPRAMIDE HCL 5 MG/ML IJ SOLN
10.0000 mg | Freq: Once | INTRAMUSCULAR | Status: AC
  Administered 2013-09-05: 10 mg via INTRAVENOUS
  Filled 2013-09-05: qty 2

## 2013-09-05 MED ORDER — DEXTROSE 5 % IV SOLN
1.0000 g | INTRAVENOUS | Status: DC
  Administered 2013-09-05 – 2013-09-06 (×2): 1 g via INTRAVENOUS
  Filled 2013-09-05 (×2): qty 10

## 2013-09-05 MED ORDER — HYDROMORPHONE HCL PF 1 MG/ML IJ SOLN
1.0000 mg | Freq: Once | INTRAMUSCULAR | Status: AC
  Administered 2013-09-05: 1 mg via INTRAVENOUS
  Filled 2013-09-05: qty 1

## 2013-09-05 MED ORDER — ONDANSETRON HCL 4 MG/2ML IJ SOLN
4.0000 mg | Freq: Four times a day (QID) | INTRAMUSCULAR | Status: DC | PRN
  Administered 2013-09-06: 4 mg via INTRAVENOUS
  Filled 2013-09-05: qty 2

## 2013-09-05 MED ORDER — ACETAMINOPHEN 325 MG PO TABS: 650.0000 mg | ORAL_TABLET | Freq: Four times a day (QID) | ORAL | Status: DC | PRN

## 2013-09-05 MED ORDER — HYDRALAZINE HCL 25 MG PO TABS
25.0000 mg | ORAL_TABLET | Freq: Two times a day (BID) | ORAL | Status: DC
  Administered 2013-09-05 – 2013-09-07 (×5): 25 mg via ORAL
  Filled 2013-09-05 (×5): qty 1

## 2013-09-05 MED ORDER — SODIUM CHLORIDE 0.9 % IV SOLN
INTRAVENOUS | Status: DC
  Administered 2013-09-05: 10:00:00 via INTRAVENOUS

## 2013-09-05 MED ORDER — HYDROMORPHONE HCL PF 1 MG/ML IJ SOLN
1.0000 mg | INTRAMUSCULAR | Status: DC | PRN
  Administered 2013-09-06 (×2): 1 mg via INTRAVENOUS
  Filled 2013-09-05 (×2): qty 1

## 2013-09-05 MED ORDER — POTASSIUM CHLORIDE IN NACL 20-0.9 MEQ/L-% IV SOLN
INTRAVENOUS | Status: DC
  Administered 2013-09-05 – 2013-09-06 (×2): via INTRAVENOUS

## 2013-09-05 MED ORDER — HYDROMORPHONE HCL PF 1 MG/ML IJ SOLN
0.5000 mg | Freq: Once | INTRAMUSCULAR | Status: AC
  Administered 2013-09-05: 0.5 mg via INTRAVENOUS
  Filled 2013-09-05: qty 1

## 2013-09-05 MED ORDER — DEXTROSE 5 % IV SOLN
1.0000 g | Freq: Once | INTRAVENOUS | Status: AC
  Administered 2013-09-05: 1 g via INTRAVENOUS
  Filled 2013-09-05: qty 10

## 2013-09-05 NOTE — ED Notes (Signed)
Pt arrived c/o severe right flank pain.  Was treated and released from the ed over the weekend. Dx w/ kidney stone.

## 2013-09-05 NOTE — ED Provider Notes (Addendum)
CSN: [id number]     Arrival date & time 09/05/13  1478 History   First MD Initiated Contact with Patient 09/05/13 0845     Chief Complaint  Patient presents with  . Flank Pain   (Consider location/radiation/quality/duration/timing/severity/associated sxs/prior Treatment) HPI Comments: Anita Warren is a 52 y.o. Female returning this am with uncontrolled right flank pain and now vomiting since she was seen here 2 nights ago and diagnosed with 2 right obstructing uretal stones,  Measuring up to 1.1 cm.  She reports a history of prior kidney stones,  But has always passed them on her own and has never seen a urologist for this problem.  She denies fevers,  But has had hot flashes at night, not new or different since the start of her flank pain.  She  Has taken reglan and dilaudid this am along with her other morning medicine and promptly vomited.  She denies abdominal pain and has had no diarrhea.     The history is provided by the patient.    Past Medical History  Diagnosis Date  . 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 HTN (hypertension)   . VT (ventricular tachycardia)     nonsustained  . Hypokalemia   . Obesity   . Pericardial effusion   . Diastolic dysfunction     grade 1   Past Surgical History  Procedure Laterality Date  . Tonsillectomy    . Breast lumpectomy  1989    L breast- benign  . Tubal ligation    . Cholecystectomy    . Cardiac defibrillator placement  5/12    SJM by JA  . Colonoscopy  07/02/2012    Procedure: COLONOSCOPY;  Surgeon: West Bali, MD;  Location: [facility];  Service: Endoscopy;  Laterality: N/A;  1:15/PATIENT HAS A DEFIBRILLATOR   Family History  Problem Relation Age of Onset  . Hypertension    . Diabetes    . Colon cancer Neg Hx    History  Substance Use Topics  . Smoking status: Never Smoker   . Smokeless tobacco: Not on file  . Alcohol Use: No   OB History   Grav Para Term Preterm Abortions TAB  SAB Ect Mult Living                 Review of Systems  Constitutional: Negative for fever.  HENT: Negative for congestion and sore throat.   Eyes: Negative.   Respiratory: Negative for chest tightness and shortness of breath.   Cardiovascular: Negative for chest pain.  Gastrointestinal: Negative for nausea and abdominal pain.  Genitourinary: Negative.   Musculoskeletal: Negative for arthralgias, joint swelling and neck pain.  Skin: Negative.  Negative for rash and wound.  Neurological: Negative for dizziness, weakness, light-headedness, numbness and headaches.  Psychiatric/Behavioral: Negative.     Allergies  Peanut-containing drug products; Ace inhibitors; Morphine; and Demerol  Home Medications   Current Outpatient Rx  Name  Route  Sig  Dispense  Refill  . acetaminophen (TYLENOL) 500 MG tablet   Oral   Take 1,000 mg by mouth as needed. For pain         . COREG 25 MG tablet      TAKE 1 TABLET BY MOUTH TWICE DAILY WITH A MEAL.   60 tablet   5   . furosemide (LASIX) 40 MG tablet   Oral   Take 1 tablet (40 mg total) by mouth daily.   90  tablet   3   . hydrALAZINE (APRESOLINE) 100 MG tablet   Oral   Take 100 mg by mouth 2 (two) times daily.         Marland Kitchen HYDROmorphone (DILAUDID) 2 MG tablet   Oral   Take 1-2 tablets (2-4 mg total) by mouth every 4 (four) hours as needed for severe pain.   30 tablet   0   . EXPIRED: isosorbide dinitrate (ISORDIL) 30 MG tablet   Oral   Take 1 tablet (30 mg total) by mouth 2 (two) times daily.   30 tablet   0   . isosorbide dinitrate (ISORDIL) 30 MG tablet   Oral   Take 30 mg by mouth 4 (four) times daily.         . metoCLOPramide (REGLAN) 10 MG tablet   Oral   Take 1 tablet (10 mg total) by mouth every 6 (six) hours as needed for nausea.   30 tablet   0   . potassium chloride (K-DUR) 10 MEQ tablet   Oral   Take 1 tablet (10 mEq total) by mouth daily.   10 tablet   0   . spironolactone (ALDACTONE) 25 MG tablet    Oral   Take 25 mg by mouth daily.           BP 163/117  Pulse 122  Temp(Src) 98.5 F (36.9 C) (Oral)  Resp 19  Ht 5\' 4"  (1.626 m)  Wt 206 lb (93.441 kg)  BMI 35.34 kg/m2  SpO2 99%  LMP 10/28/1999 Physical Exam  Nursing note and vitals reviewed. Constitutional: She appears well-developed and well-nourished.  HENT:  Head: Normocephalic and atraumatic.  Eyes: Conjunctivae are normal.  Neck: Normal range of motion.  Cardiovascular: Regular rhythm, normal heart sounds and intact distal pulses.  Tachycardia present.   Pulmonary/Chest: Effort normal and breath sounds normal. She has no wheezes.  Abdominal: Soft. Bowel sounds are normal. There is no tenderness. There is CVA tenderness. There is no guarding.  Musculoskeletal: Normal range of motion.  Neurological: She is alert.  Skin: Skin is warm and dry.  Psychiatric: She has a normal mood and affect.    ED Course  Procedures (including critical care time) Labs Review Labs Reviewed  URINALYSIS, ROUTINE W REFLEX MICROSCOPIC - Abnormal; Notable for the following:    Specific Gravity, Urine >1.030 (*)    Hgb urine dipstick LARGE (*)    Bilirubin Urine SMALL (*)    Protein, ur 100 (*)    Urobilinogen, UA 4.0 (*)    Leukocytes, UA MODERATE (*)    All other components within normal limits  BASIC METABOLIC PANEL - Abnormal; Notable for the following:    Potassium 3.3 (*)    Glucose, Bld 170 (*)    Creatinine, Ser 1.15 (*)    GFR calc non Af Amer 54 (*)    GFR calc Af Amer 62 (*)    All other components within normal limits  CBC WITH DIFFERENTIAL - Abnormal; Notable for the following:    Neutrophils Relative % 41 (*)    Lymphs Abs 4.2 (*)    All other components within normal limits  URINE MICROSCOPIC-ADD ON - Abnormal; Notable for the following:    Squamous Epithelial / LPF MANY (*)    Bacteria, UA MANY (*)    All other components within normal limits  URINE CULTURE   Imaging Review No results found.  EKG  Interpretation   None     10:19 AM  Pt given reglan 10 mg,  Dilaudid 1 mg IV - nausea better, pain 8/10. Repeated dilaudid 0.5 mg x 1.   MDM   1. Ureteral stone with hydronephrosis   2. UTI (lower urinary tract infection)      Labs reviewed, patients urine is not a clean catch with many epith cells/bacteria,  But with increased wbc's.  Will cover with rocephin.   Call placed to Dr Jerre Simon,  Agree with need for admission.  Asks for med admission,  Will see later this afternoon.  Spoke with Dr. Jomarie Longs,  Will admit,  Temp admit orders given.  Burgess Amor, PA-C 09/05/13 1019  Burgess Amor, PA-C 09/13/13 1623

## 2013-09-05 NOTE — H&P (Signed)
[facility] History and Physical  Anita Warren:[id number] DOB: 1961/07/07 DOA: 09/05/2013  Referring physician: EDP PCP: Cassell Smiles., MD  Specialists: cards Dr.Taylor  Chief Complaint: Right flank pain   HPI: Anita Warren is a 52 y.o. female with history of nonischemic cardiomyopathy, EF of 10-20%, AICD, hypertension, obesity, presented to [facility] emergency room 2 days ago with right flank pain she was diagnosed to have obstructive right ureteral calculi and was discharged home with pain medicines and advised to followup with urology Dr.Javaid, and the term patient continued to have severe right flank pain associated with nausea and vomiting the pain radiates down around the right side to the front. She denies any fevers, reports chills and profuse diaphoresis. Upon evaluation in ER this time, it is my bump in creatinine and UA suggestive of UTI. Urology was consulted and hospitalists admission was requested.   Review of Systems: The patient denies anorexia, fever, weight loss,, vision loss, decreased hearing, hoarseness, chest pain, syncope, dyspnea on exertion, peripheral edema, balance deficits, hemoptysis, abdominal pain, melena, hematochezia, severe indigestion/heartburn, hematuria, incontinence, genital sores, muscle weakness, suspicious skin lesions, transient blindness, difficulty walking, depression, unusual weight change, abnormal bleeding, enlarged lymph nodes, angioedema, and breast masses.    Past Medical History  Diagnosis Date  . 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 HTN (hypertension)   . VT (ventricular tachycardia)     nonsustained  . Hypokalemia   . Obesity   . Pericardial effusion   . Diastolic dysfunction     grade 1   Past Surgical History  Procedure Laterality Date  . Tonsillectomy    . Breast lumpectomy  1989    L breast- benign  . Tubal ligation    . Cholecystectomy    . Cardiac  defibrillator placement  5/12    SJM by JA  . Colonoscopy  07/02/2012    Procedure: COLONOSCOPY;  Surgeon: West Bali, MD;  Location: [facility];  Service: Endoscopy;  Laterality: N/A;  1:15/PATIENT HAS A DEFIBRILLATOR   Social History:  reports that she has never smoked. She does not have any smokeless tobacco history on file. She reports that she does not drink alcohol or use illicit drugs.   Allergies  Allergen Reactions  . Peanut-Containing Drug Products Anaphylaxis  . Ace Inhibitors Swelling  . Morphine Nausea And Vomiting  . Demerol Rash    Family History  Problem Relation Age of Onset  . Hypertension    . Diabetes    . Colon cancer Neg Hx     Prior to Admission medications   Medication Sig Start Date End Date Taking? Authorizing Provider  acetaminophen (TYLENOL) 500 MG tablet Take 1,000 mg by mouth as needed. For pain   Yes Historical Provider, MD  COREG 25 MG tablet TAKE 1 TABLET BY MOUTH TWICE DAILY WITH A MEAL. 08/16/12  Yes Gaylord Shih, MD  furosemide (LASIX) 40 MG tablet Take 1 tablet (40 mg total) by mouth daily. 07/04/13  Yes Hillis Range, MD  hydrALAZINE (APRESOLINE) 100 MG tablet Take 100 mg by mouth 2 (two) times daily.   Yes Historical Provider, MD  HYDROmorphone (DILAUDID) 2 MG tablet Take 1-2 tablets (2-4 mg total) by mouth every 4 (four) hours as needed for severe pain. 09/03/13  Yes Dione Booze, MD  isosorbide dinitrate (ISORDIL) 30 MG tablet Take 30 mg by mouth 2 (two) times daily.    Yes Historical Provider, MD  metoCLOPramide (REGLAN)  10 MG tablet Take 1 tablet (10 mg total) by mouth every 6 (six) hours as needed for nausea. 09/03/13  Yes Dione Booze, MD  potassium chloride (K-DUR) 10 MEQ tablet Take 1 tablet (10 mEq total) by mouth daily. 09/03/13  Yes Dione Booze, MD  spironolactone (ALDACTONE) 25 MG tablet Take 25 mg by mouth daily.  04/30/12  Yes Historical Provider, MD   Physical Exam: Filed Vitals:   09/05/13 0840  BP: 163/117  Pulse: 122  Temp:  98.5 F (36.9 C)  Resp: 19     General: AAOx3, very uncomfortable appearing  HEENT: PERRLA, EOMI  Cardiovascular: S1S2/RRR  Respiratory: CTAB  Abdomen: soft, obese, R flank tender, BS present  Skin: no rashes or skin breakdown  Musculoskeletal: no edema c/c  Psychiatric: appropriate mood and affect  Neurologic: non focal  Labs on Admission:  Basic Metabolic Panel:  Recent Labs Lab 09/03/13 0428 09/05/13 0859  NA 139 136  K 3.1* 3.3*  CL 102 100  CO2 23 24  GLUCOSE 180* 170*  BUN 24* 16  CREATININE 1.09 1.15*  CALCIUM 9.1 9.3   Liver Function Tests:  Recent Labs Lab 09/03/13 0428  AST 23  ALT 35  ALKPHOS 42  BILITOT 0.6  PROT 7.4  ALBUMIN 3.5    Recent Labs Lab 09/03/13 0428  LIPASE 25   No results found for this basename: AMMONIA,  in the last 168 hours CBC:  Recent Labs Lab 09/03/13 0428 09/05/13 0859  WBC 9.7 9.1  NEUTROABS 4.8 3.8  HGB 11.8* 12.2  HCT 35.6* 36.5  MCV 90.1 89.7  PLT 215 225   Cardiac Enzymes: No results found for this basename: CKTOTAL, CKMB, CKMBINDEX, TROPONINI,  in the last 168 hours  BNP (last 3 results)  Recent Labs  07/04/13 1307  PROBNP 995.0*   CBG: No results found for this basename: GLUCAP,  in the last 168 hours  Radiological Exams on Admission: No results found.   Assessment/Plan 1. Complicated Pyelonephritis in setting of obstructive ureteral calculi with hydronephrosis and hydroureter -admit, aggressive IVF -IV ceftriaxone, FU urine Cx -Urology Dr.Javaid consulted  -needs cysto and stone retrieval  2. NICM EF of 10-20% -compensated -hold diuretics, resume coreg, imdur and hydralazine  3. HTN -BP elevated -continue coreg, imdur, hydralazine  4. Hypokalemia -replace  DVt proph: SCDs pending possible procedure  Code Status: Full Code Family Communication:d/w husband at bedside Disposition Plan: inaptient  Time spent:38min  Tisa Weisel [facility] Pager  [phone]  If 7PM-7AM, please contact night-coverage [email or web address] Password [email or web address] 09/05/2013, 11:10 AM

## 2013-09-05 NOTE — ED Notes (Signed)
Patient with c/o continued right flank pain. Seen 09/03/13, dx with ureterolithiasis and right hydronephrosis. States vomiting this morning.

## 2013-09-05 NOTE — Consult Note (Signed)
Note [phone]

## 2013-09-05 NOTE — ED Notes (Signed)
Pt had episode of vomiting, also reports increased pain. Pa notified, meds given.

## 2013-09-05 NOTE — ED Notes (Signed)
Report given to Latoya, RN unit 300. Ready to receive patient. 

## 2013-09-06 ENCOUNTER — Encounter (HOSPITAL_COMMUNITY): Payer: PRIVATE HEALTH INSURANCE | Admitting: Anesthesiology

## 2013-09-06 ENCOUNTER — Encounter (HOSPITAL_COMMUNITY): Payer: Self-pay

## 2013-09-06 ENCOUNTER — Inpatient Hospital Stay (HOSPITAL_COMMUNITY): Payer: PRIVATE HEALTH INSURANCE

## 2013-09-06 ENCOUNTER — Encounter (HOSPITAL_COMMUNITY)
Admission: EM | Disposition: A | Discharge status: Home / Self Care | Payer: Self-pay | Source: Home / Self Care | Attending: Internal Medicine

## 2013-09-06 ENCOUNTER — Inpatient Hospital Stay (HOSPITAL_COMMUNITY): Payer: PRIVATE HEALTH INSURANCE | Admitting: Anesthesiology

## 2013-09-06 DIAGNOSIS — I10 Essential (primary) hypertension: Secondary | ICD-10-CM

## 2013-09-06 DIAGNOSIS — N289 Disorder of kidney and ureter, unspecified: Secondary | ICD-10-CM

## 2013-09-06 DIAGNOSIS — N1330 Unspecified hydronephrosis: Secondary | ICD-10-CM

## 2013-09-06 DIAGNOSIS — N10 Acute tubulo-interstitial nephritis: Secondary | ICD-10-CM

## 2013-09-06 HISTORY — PX: CYSTOSCOPY W/ URETERAL STENT PLACEMENT: SHX1429

## 2013-09-06 LAB — CBC
Hemoglobin: 10.1 g/dL — ABNORMAL LOW (ref 12.0–15.0)
MCH: 30.1 pg (ref 26.0–34.0)
MCHC: 32.9 g/dL (ref 30.0–36.0)
Platelets: 168 10*3/uL (ref 150–400)
RBC: 3.35 MIL/uL — ABNORMAL LOW (ref 3.87–5.11)
WBC: 8.7 10*3/uL (ref 4.0–10.5)

## 2013-09-06 LAB — BASIC METABOLIC PANEL
CO2: 25 mEq/L (ref 19–32)
Calcium: 8.8 mg/dL (ref 8.4–10.5)
Chloride: 105 mEq/L (ref 96–112)
Glucose, Bld: 110 mg/dL — ABNORMAL HIGH (ref 70–99)
Potassium: 3.5 mEq/L (ref 3.5–5.1)
Sodium: 138 mEq/L (ref 135–145)

## 2013-09-06 LAB — URINE CULTURE: Culture: >=100000

## 2013-09-06 LAB — SURGICAL PCR SCREEN
MRSA, PCR: NEGATIVE
Staphylococcus aureus: NEGATIVE

## 2013-09-06 SURGERY — CYSTOSCOPY, WITH RETROGRADE PYELOGRAM AND URETERAL STENT INSERTION
Anesthesia: Monitor Anesthesia Care | Laterality: Right | Wound class: Clean Contaminated

## 2013-09-06 MED ORDER — LIDOCAINE HCL (PF) 1 % IJ SOLN
INTRAMUSCULAR | Status: AC
  Filled 2013-09-06: qty 5

## 2013-09-06 MED ORDER — ONDANSETRON HCL 4 MG/2ML IJ SOLN
4.0000 mg | Freq: Once | INTRAMUSCULAR | Status: AC
  Administered 2013-09-06: 4 mg via INTRAVENOUS

## 2013-09-06 MED ORDER — LIDOCAINE VISCOUS 2 % MT SOLN
OROMUCOSAL | Status: DC | PRN
  Administered 2013-09-06: 1

## 2013-09-06 MED ORDER — SODIUM CHLORIDE 0.9 % IR SOLN
Status: DC | PRN
  Administered 2013-09-06: 3000 mL via INTRAVESICAL

## 2013-09-06 MED ORDER — DEXAMETHASONE SODIUM PHOSPHATE 4 MG/ML IJ SOLN
4.0000 mg | Freq: Once | INTRAMUSCULAR | Status: AC
  Administered 2013-09-06: 4 mg via INTRAVENOUS

## 2013-09-06 MED ORDER — GLYCOPYRROLATE 0.2 MG/ML IJ SOLN
INTRAMUSCULAR | Status: AC
  Filled 2013-09-06: qty 1

## 2013-09-06 MED ORDER — LIDOCAINE HCL (CARDIAC) 10 MG/ML IV SOLN
INTRAVENOUS | Status: DC | PRN
  Administered 2013-09-06: 20 mg via INTRAVENOUS

## 2013-09-06 MED ORDER — LACTATED RINGERS IV SOLN
INTRAVENOUS | Status: DC
  Administered 2013-09-06: 12:00:00 via INTRAVENOUS

## 2013-09-06 MED ORDER — LIDOCAINE VISCOUS 2 % MT SOLN
OROMUCOSAL | Status: AC
  Filled 2013-09-06: qty 15

## 2013-09-06 MED ORDER — FENTANYL CITRATE 0.05 MG/ML IJ SOLN
25.0000 ug | INTRAMUSCULAR | Status: AC
  Administered 2013-09-06 (×2): 25 ug via INTRAVENOUS

## 2013-09-06 MED ORDER — PROPOFOL 10 MG/ML IV BOLUS
INTRAVENOUS | Status: AC
  Filled 2013-09-06: qty 20

## 2013-09-06 MED ORDER — FENTANYL CITRATE 0.05 MG/ML IJ SOLN
INTRAMUSCULAR | Status: DC | PRN
  Administered 2013-09-06: 25 ug via INTRAVENOUS

## 2013-09-06 MED ORDER — IOHEXOL 350 MG/ML SOLN
INTRAVENOUS | Status: DC | PRN
  Administered 2013-09-06: 5 mL

## 2013-09-06 MED ORDER — HYDROMORPHONE HCL 2 MG PO TABS
2.0000 mg | ORAL_TABLET | ORAL | Status: AC | PRN
  Administered 2013-09-06 – 2013-09-07 (×2): 2 mg via ORAL
  Filled 2013-09-06 (×3): qty 1

## 2013-09-06 MED ORDER — ONDANSETRON HCL 4 MG/2ML IJ SOLN: 4.0000 mg | Freq: Once | INTRAMUSCULAR | Status: DC | PRN

## 2013-09-06 MED ORDER — ONDANSETRON HCL 4 MG/2ML IJ SOLN
INTRAMUSCULAR | Status: AC
  Filled 2013-09-06: qty 2

## 2013-09-06 MED ORDER — STERILE WATER FOR IRRIGATION IR SOLN
Status: DC | PRN
  Administered 2013-09-06: 3000 mL

## 2013-09-06 MED ORDER — FENTANYL CITRATE 0.05 MG/ML IJ SOLN: 25.0000 ug | INTRAMUSCULAR | Status: DC | PRN

## 2013-09-06 MED ORDER — MIDAZOLAM HCL 2 MG/2ML IJ SOLN
1.0000 mg | INTRAMUSCULAR | Status: DC | PRN
  Administered 2013-09-06: 2 mg via INTRAVENOUS

## 2013-09-06 MED ORDER — FENTANYL CITRATE 0.05 MG/ML IJ SOLN
INTRAMUSCULAR | Status: AC
  Filled 2013-09-06: qty 2

## 2013-09-06 MED ORDER — DEXAMETHASONE SODIUM PHOSPHATE 4 MG/ML IJ SOLN
INTRAMUSCULAR | Status: AC
  Filled 2013-09-06: qty 1

## 2013-09-06 MED ORDER — MIDAZOLAM HCL 2 MG/2ML IJ SOLN
INTRAMUSCULAR | Status: AC
  Filled 2013-09-06: qty 2

## 2013-09-06 MED ORDER — ONDANSETRON HCL 4 MG PO TABS
4.0000 mg | ORAL_TABLET | Freq: Three times a day (TID) | ORAL | Status: DC | PRN
  Administered 2013-09-07: 4 mg via ORAL
  Filled 2013-09-06: qty 1

## 2013-09-06 MED ORDER — PROPOFOL INFUSION 10 MG/ML OPTIME
INTRAVENOUS | Status: DC | PRN
  Administered 2013-09-06: 75 ug/kg/min via INTRAVENOUS

## 2013-09-06 MED ORDER — GLYCOPYRROLATE 0.2 MG/ML IJ SOLN
0.2000 mg | Freq: Once | INTRAMUSCULAR | Status: AC
  Administered 2013-09-06: 0.2 mg via INTRAVENOUS

## 2013-09-06 MED ORDER — CIPROFLOXACIN HCL 500 MG PO TABS: 500.0000 mg | ORAL_TABLET | Freq: Two times a day (BID) | ORAL | Status: DC

## 2013-09-06 SURGICAL SUPPLY — 24 items
BAG DRAIN URO TABLE W/ADPT NS (DRAPE) ×2 IMPLANT
CATH 5 FR WEDGE TIP (UROLOGICAL SUPPLIES) ×2 IMPLANT
CATH OPEN TIP 5FR (CATHETERS) ×2 IMPLANT
CLOTH BEACON ORANGE TIMEOUT ST (SAFETY) ×2 IMPLANT
DECANTER SPIKE VIAL GLASS SM (MISCELLANEOUS) ×2 IMPLANT
DILATOR UROMAX ULTRA (MISCELLANEOUS) IMPLANT
GLOVE BIO SURGEON STRL SZ7 (GLOVE) ×2 IMPLANT
GLOVE BIOGEL PI IND STRL 7.0 (GLOVE) ×1 IMPLANT
GLOVE BIOGEL PI IND STRL 7.5 (GLOVE) ×1 IMPLANT
GLOVE BIOGEL PI INDICATOR 7.0 (GLOVE) ×1
GLOVE BIOGEL PI INDICATOR 7.5 (GLOVE) ×1
GLOVE EXAM NITRILE MD LF STRL (GLOVE) ×2 IMPLANT
GOWN STRL REIN XL XLG (GOWN DISPOSABLE) ×4 IMPLANT
IV NS IRRIG 3000ML ARTHROMATIC (IV SOLUTION) ×4 IMPLANT
KIT ROOM TURNOVER AP CYSTO (KITS) ×2 IMPLANT
MANIFOLD NEPTUNE II (INSTRUMENTS) ×2 IMPLANT
PACK CYSTO (CUSTOM PROCEDURE TRAY) ×2 IMPLANT
PAD ARMBOARD 7.5X6 YLW CONV (MISCELLANEOUS) ×2 IMPLANT
SET IRRIGATING DISP (SET/KITS/TRAYS/PACK) ×2 IMPLANT
STENT PERCUFLEX 4.8FRX24 (STENTS) ×2 IMPLANT
STONE RETRIEVAL GEMINI 2.4 FR (MISCELLANEOUS) IMPLANT
TOWEL OR 17X26 4PK STRL BLUE (TOWEL DISPOSABLE) ×2 IMPLANT
WATER STERILE IRR 3000ML UROMA (IV SOLUTION) ×2 IMPLANT
WIRE GUIDE BENTSON .035 15CM (WIRE) ×2 IMPLANT

## 2013-09-06 NOTE — Transfer of Care (Signed)
Immediate Anesthesia Transfer of Care Note  Patient: Anita Warren  Procedure(s) Performed: Procedure(s): CYSTOSCOPY WITH RIGHT RETROGRADE PYELOGRAM; RIGHT URETERAL STENT PLACEMENT (Right)  Patient Location: PACU  Anesthesia Type:MAC  Level of Consciousness: awake  Airway & Oxygen Therapy: Patient Spontanous Breathing and Patient connected to nasal cannula oxygen  Post-op Assessment: Report given to PACU RN  Post vital signs: Reviewed and stable  Complications: No apparent anesthesia complications

## 2013-09-06 NOTE — ED Provider Notes (Signed)
Medical screening examination/treatment/procedure(s) were performed by non-physician practitioner and as supervising physician I was immediately available for consultation/collaboration.  EKG Interpretation     Ventricular Rate:    PR Interval:    QRS Duration:   QT Interval:    QTC Calculation:   R Axis:     Text Interpretation:               Raeford Razor, MD 09/06/13 1106

## 2013-09-06 NOTE — Anesthesia Procedure Notes (Signed)
Procedure Name: MAC Date/Time: 09/06/2013 12:31 PM Performed by: Franco Nones Pre-anesthesia Checklist: Patient identified, Emergency Drugs available, Suction available, Timeout performed and Patient being monitored Patient Re-evaluated:Patient Re-evaluated prior to inductionOxygen Delivery Method: Non-rebreather mask

## 2013-09-06 NOTE — Anesthesia Preprocedure Evaluation (Signed)
Anesthesia Evaluation  Patient identified by MRN, date of birth, ID band Patient awake    Reviewed: Allergy & Precautions, H&P , NPO status , Patient's Chart, lab work & pertinent test results  History of Anesthesia Complications (+) PONV and history of anesthetic complications  Airway Mallampati: I TM Distance: >3 FB     Dental  (+) Teeth Intact   Pulmonary  breath sounds clear to auscultation        Cardiovascular hypertension, Pt. on medications +CHF DVT: EF 10% + dysrhythmias Ventricular Tachycardia + Cardiac Defibrillator Rhythm:Regular Rate:Normal     Neuro/Psych    GI/Hepatic   Endo/Other    Renal/GU Renal disease     Musculoskeletal   Abdominal   Peds  Hematology   Anesthesia Other Findings Nauseated this AM  Reproductive/Obstetrics                           Anesthesia Physical Anesthesia Plan  ASA: IV  Anesthesia Plan: MAC   Post-op Pain Management:    Induction: Intravenous  Airway Management Planned: Simple Face Mask  Additional Equipment:   Intra-op Plan:   Post-operative Plan:   Informed Consent: I have reviewed the patients History and Physical, chart, labs and discussed the procedure including the risks, benefits and alternatives for the proposed anesthesia with the patient or authorized representative who has indicated his/her understanding and acceptance.     Plan Discussed with:   Anesthesia Plan Comments: (Discussed possible SAB if Mac is insufficient.)        Anesthesia Quick Evaluation

## 2013-09-06 NOTE — Progress Notes (Signed)
[facility] PROGRESS NOTE  Anita Warren ZOX:[id number] DOB: 06-19-1961 DOA: 09/05/2013 PCP: Cassell Smiles., MD  Assessment/Plan: 1. Complicated Pyelonephritis in setting of obstructive ureteral calculi with hydronephrosis and hydroureter  -continue IVF, IV ceftriaxone -FU urine Cx  -Urology Dr.Javaid following, appreciate Consult -for cysto and stone removal today  2. NICM EF of 10-20%  -compensated  -hold diuretics, resume coreg, imdur and hydralazine  -cut down IVF post op  3. HTN  -BP better controlled -continue coreg, imdur, hydralazine   4. Hypokalemia  -replaced  DVt proph: SCDs due to pending procedure  Code Status: Full Code  Family Communication:d/w husband and sister at bedside  Disposition Plan: home in 1-2days  Consultants:  Urology  Antibiotics:  ceftraixone   HPI/Subjective: Feels much better, pain improving, no further N/V  Objective: Filed Vitals:   09/06/13 1225  BP: 133/82  Pulse:   Temp:   Resp: 18    Intake/Output Summary (Last 24 hours) at 09/06/13 1303 Last data filed at 09/06/13 1241  Gross per 24 hour  Intake 1611.67 ml  Output    400 ml  Net 1211.67 ml   Filed Weights   09/05/13 0840 09/05/13 1447 09/06/13 1122  Weight: 93.441 kg (206 lb) 93.441 kg (206 lb) 93.441 kg (206 lb)    Exam: General: AAOx3, well appearing  HEENT: PERRLA, EOMI   Cardiovascular: S1S2/RRR   Respiratory: CTAB  Abdomen: soft, obese, R flank less tender, BS present  Skin: no rashes or skin breakdown  Musculoskeletal: no edema c/c     Data Reviewed: Basic Metabolic Panel:  Recent Labs Lab 09/03/13 0428 09/05/13 0859 09/06/13 0536  NA 139 136 138  K 3.1* 3.3* 3.5  CL 102 100 105  CO2 23 24 25   GLUCOSE 180* 170* 110*  BUN 24* 16 17  CREATININE 1.09 1.15* 1.16*  CALCIUM 9.1 9.3 8.8   Liver Function Tests:  Recent Labs Lab 09/03/13 0428  AST 23  ALT 35  ALKPHOS 42  BILITOT 0.6  PROT 7.4  ALBUMIN 3.5     Recent Labs Lab 09/03/13 0428  LIPASE 25   No results found for this basename: AMMONIA,  in the last 168 hours CBC:  Recent Labs Lab 09/03/13 0428 09/05/13 0859 09/06/13 0536  WBC 9.7 9.1 8.7  NEUTROABS 4.8 3.8  --   HGB 11.8* 12.2 10.1*  HCT 35.6* 36.5 30.7*  MCV 90.1 89.7 91.6  PLT 215 225 168   Cardiac Enzymes: No results found for this basename: CKTOTAL, CKMB, CKMBINDEX, TROPONINI,  in the last 168 hours BNP (last 3 results)  Recent Labs  07/04/13 1307  PROBNP 995.0*   CBG: No results found for this basename: GLUCAP,  in the last 168 hours  Recent Results (from the past 240 hour(s))  URINE CULTURE     Status: None   Collection Time    09/03/13  5:18 AM      Result Value Range Status   Specimen Description URINE, CLEAN CATCH   Final   Special Requests NONE   Final   Culture  Setup Time     Final   Value: 09/03/2013 21:53     Performed at [facility] Count     Final   Value: NO GROWTH     Performed at [facility]   Culture     Final   Value: NO GROWTH     Performed at [facility]   Report Status 09/04/2013 FINAL  Final  URINE CULTURE     Status: None   Collection Time    09/05/13  9:20 AM      Result Value Range Status   Specimen Description URINE, CLEAN CATCH   Final   Special Requests NONE   Final   Culture  Setup Time     Final   Value: 09/05/2013 13:58     Performed at [facility]   Culture     Final   Value: >=100,000 COLONIES/mL DIPHTHEROIDS(CORYNEBACTERIUM SPECIES)     Note: Standardized susceptibility testing for this organism is not available.     Performed at [facility]   Report Status 09/06/2013 FINAL   Final  SURGICAL PCR SCREEN     Status: None   Collection Time    09/06/13  9:19 AM      Result Value Range Status   MRSA, PCR NEGATIVE  NEGATIVE Final   Staphylococcus aureus NEGATIVE  NEGATIVE Final   Comment:            The Xpert SA Assay (FDA     approved for NASAL  specimens     in patients over 33 years of age),     is one component of     a comprehensive surveillance     program.  Test performance has     been validated by [employer] for patients greater     than or equal to 17 year old.     It is not intended     to diagnose infection nor to     guide or monitor treatment.     Studies: Dg Abd 1 View  09/05/2013   CLINICAL DATA:  52 year old female with abdominal pain. Nephrolithiasis. Cholelithiasis. Initial encounter.  EXAM: ABDOMEN - 1 VIEW  COMPARISON:  CT Abdomen and Pelvis 09/03/2013.  FINDINGS: Proximal obstructing right ureteral calculi were not delineated on the scout view of the comparison. Subsequently, there position is unclear on this exam. Bilateral pelvic phleboliths are re- identified. Aortoiliac calcified atherosclerosis noted. Right upper quadrant surgical clips. Cardiomegaly with AICD lead. Non obstructed bowel gas pattern. No acute osseous abnormality identified.  IMPRESSION: Proximal obstructing right ureteral calculi poorly delineated with plain radiographic technique. Renal ultrasound may be valuable as away to re-evaluate right renal obstruction without the radiation associated with eighty repeat non contrast CT Abdomen and Pelvis.   Electronically Signed   By: Augusto Gamble M.D.   On: 09/05/2013 18:18    Scheduled Meds: . [facility] HOLD] carvedilol  25 mg Oral BID WC  . [[facility] HOLD] cefTRIAXone (ROCEPHIN)  IV  1 g Intravenous Q24H  . [facility] HOLD] hydrALAZINE  25 mg Oral BID  . [facility] HOLD] isosorbide dinitrate  30 mg Oral BID   Continuous Infusions: . sodium chloride Stopped (09/05/13 1205)  . 0.9 % NaCl with KCl 20 mEq / L 100 mL/hr at 09/06/13 0623  . lactated ringers 75 mL/hr at 09/06/13 1209    Principal Problem:   Pyelonephritis, acute Active Problems:   HYPERTENSION   CARDIOMYOPATHY, SECONDARY   Hydronephrosis with renal and ureteral calculus obstruction    Time spent:    [facility]  [facility] Pager [phone]. If 7PM-7AM, please contact night-coverage at [email or web address], password [email or web address] 09/06/2013, 1:03 PM  LOS: 1 day

## 2013-09-06 NOTE — Op Note (Signed)
Op [phone]

## 2013-09-06 NOTE — Progress Notes (Signed)
UR chart review completed.  

## 2013-09-06 NOTE — Anesthesia Postprocedure Evaluation (Signed)
  Anesthesia Post-op Note  Patient: Anita Warren  Procedure(s) Performed: Procedure(s): CYSTOSCOPY WITH RIGHT RETROGRADE PYELOGRAM; RIGHT URETERAL STENT PLACEMENT (Right)  Patient Location: PACU  Anesthesia Type:MAC  Level of Consciousness: awake, alert  and oriented  Airway and Oxygen Therapy: Patient Spontanous Breathing and Patient connected to nasal cannula oxygen  Post-op Pain: none  Post-op Assessment: Post-op Vital signs reviewed, Patient's Cardiovascular Status Stable, Respiratory Function Stable, Patent Airway and No signs of Nausea or vomiting  Post-op Vital Signs: Reviewed and stable  Complications: No apparent anesthesia complications

## 2013-09-06 NOTE — Consult Note (Signed)
Anita Warren, Anita Warren        ACCOUNT NO.:  [id number]  MEDICAL RECORD NO.:  [id number]  LOCATION:  A316                          FACILITY:  [facility]  PHYSICIAN:  Ky Barban, M.D.DATE OF BIRTH:  Aug 15, 1961  DATE OF CONSULTATION: DATE OF DISCHARGE:                                CONSULTATION   CHIEF COMPLAINT:  Recurrent right renal colic.  HISTORY OF PRESENT ILLNESS:  This is a 52 year old female, who has a history of a nonischemic cardiomyopathy, ejection fraction of 10-20%. She presented couple of days ago with pain in the right flank.  CT scan was done.  It showed that she has 2 stones in the right upper ureter and about 1 cm each causing partial obstruction.  She was sent home, comes back again today with right renal colic.  She is admitted for further management and control of pain.  She has nausea and vomiting with the pain.  PAST MEDICAL HISTORY:  As I mentioned above, she has hypertension, obesity, and nonischemic cardiomyopathy.  PAST SURGICAL HISTORY:  Tonsillectomy, breast lumpectomy, left breast benign tumor removed, tubal ligation, cholecystectomy, cardiac defibrillator placement, history of colonoscopy.  PERSONAL HISTORY:  History of smoking or drinking or taking any illicit drugs.  ALLERGIES:  PEANUT CONTAINING DRUGS, ACE INHIBITOR, MORPHINE, AND DEMEROL.  FAMILY HISTORY:  She has hypertension, but no diabetes and hypertension and diabetes runs in her family along with colon cancer.  PHYSICAL EXAMINATION:  GENERAL:  Moderately obese female, not in acute distress, fully conscious, alert, oriented. ABDOMEN:  Obese.  Liver, spleen, and kidneys not palpable. NECK:  There is 1+ right CVA tenderness. PELVIC:  Deferred. EXTREMITIES:  Normal.  LABORATORY DATA:  WBC count is 9.7 couple of days ago.  Her hematocrit is 35.6.  Sodium 139, potassium 3.1, chloride 102, CO2 is 23.  Potassium 3.1 on September 03, 2013; today it is 3.3.  BUN is 16 today.   Creatinine 1.15.  Her serum calcium is 9.3.  I reviewed her KUB.  The stones are radiolucent, easily visible on CT scan causing partial obstruction.  They are located in the right upper ureter.  No renal calculi are seen.  IMPRESSION: 1. Right upper ureteral calculi. 2. Hypertension. 3. Obesity.  PLAN:  I will consider putting a double-J stent, then decided later on about doing lithotripsy.  I discussed with the patient about insertion of double-J stent with anesthesia standby tomorrow with right retrograde pyelogram.  Procedure, limitations, and complications were discussed.     Ky Barban, M.D.     MIJ/MEDQ  D:  09/05/2013  T:  09/06/2013  Job:  [id number]

## 2013-09-07 DIAGNOSIS — N289 Disorder of kidney and ureter, unspecified: Secondary | ICD-10-CM

## 2013-09-07 DIAGNOSIS — I429 Cardiomyopathy, unspecified: Secondary | ICD-10-CM

## 2013-09-07 LAB — CBC
HCT: 31.7 % — ABNORMAL LOW (ref 36.0–46.0)
Hemoglobin: 10.5 g/dL — ABNORMAL LOW (ref 12.0–15.0)
MCHC: 33.1 g/dL (ref 30.0–36.0)
MCV: 90.6 fL (ref 78.0–100.0)
Platelets: 198 10*3/uL (ref 150–400)
RDW: 15.1 % (ref 11.5–15.5)
WBC: 9.1 10*3/uL (ref 4.0–10.5)

## 2013-09-07 LAB — BASIC METABOLIC PANEL
BUN: 16 mg/dL (ref 6–23)
Calcium: 9 mg/dL (ref 8.4–10.5)
Chloride: 104 mEq/L (ref 96–112)
Creatinine, Ser: 1.25 mg/dL — ABNORMAL HIGH (ref 0.50–1.10)
GFR calc Af Amer: 56 mL/min — ABNORMAL LOW (ref >90)
GFR calc non Af Amer: 49 mL/min — ABNORMAL LOW (ref >90)

## 2013-09-07 MED ORDER — HYDROMORPHONE HCL 2 MG PO TABS: 2.0000 mg | ORAL_TABLET | ORAL | Status: DC | PRN

## 2013-09-07 MED ORDER — FUROSEMIDE 40 MG PO TABS
40.0000 mg | ORAL_TABLET | Freq: Every day | ORAL | Status: DC
Start: 2013-09-07 — End: 2013-09-07
  Administered 2013-09-07: 40 mg via ORAL
  Filled 2013-09-07: qty 1

## 2013-09-07 MED ORDER — HYDROMORPHONE HCL 2 MG PO TABS
2.0000 mg | ORAL_TABLET | ORAL | Status: DC | PRN
  Administered 2013-09-07: 2 mg via ORAL
  Filled 2013-09-07: qty 1

## 2013-09-07 MED ORDER — SPIRONOLACTONE 25 MG PO TABS
25.0000 mg | ORAL_TABLET | Freq: Every day | ORAL | Status: DC
  Administered 2013-09-07: 25 mg via ORAL
  Filled 2013-09-07: qty 1

## 2013-09-07 MED ORDER — POTASSIUM CHLORIDE CRYS ER 20 MEQ PO TBCR
20.0000 meq | EXTENDED_RELEASE_TABLET | Freq: Two times a day (BID) | ORAL | Status: DC
  Administered 2013-09-07: 20 meq via ORAL
  Filled 2013-09-07: qty 1

## 2013-09-07 NOTE — Anesthesia Postprocedure Evaluation (Signed)
  Anesthesia Post-op Note  Patient: Anita Warren  Procedure(s) Performed: Procedure(s): CYSTOSCOPY WITH RIGHT RETROGRADE PYELOGRAM; RIGHT URETERAL STENT PLACEMENT (Right)  Patient Location: Room 316  Anesthesia Type:MAC  Level of Consciousness: awake, alert , oriented and patient cooperative  Airway and Oxygen Therapy: Patient Spontanous Breathing  Post-op Pain: mild  Post-op Assessment: Post-op Vital signs reviewed, Patient's Cardiovascular Status Stable, Respiratory Function Stable, Patent Airway, No signs of Nausea or vomiting and Pain level controlled  Post-op Vital Signs: Reviewed and stable  Complications: No apparent anesthesia complications

## 2013-09-07 NOTE — Discharge Summary (Signed)
Physician Discharge Summary  Anita Warren ZOX:[id number] DOB: [birth year] DOA: 09/05/2013  PCP: Cassell Smiles., MD  Admit date: 09/05/2013 Discharge date: 09/07/2013  Time spent: Greater than 30 minutes  Recommendations for Outpatient Follow-up:  1. The patient will need follow up of her renal function next week at the follow up appointment with Dr. Jerre Simon.  Discharge Diagnoses:    Pyelonephritis, acute   Hydronephrosis with renal and ureteral calculus obstruction; status-post cystoscopy with right retrograde pyelogram and right ureteral stent placement.   Acute renal insufficiency, secondary to obstructive uropathy.   Hypokalemia   Dilutional anemia.   HYPERTENSION   CARDIOMYOPATHY, SECONDARY   Obesity   Discharge Condition: Improved  Diet recommendation: Heart Healthy  Filed Weights   09/05/13 0840 09/05/13 1447 09/06/13 1122  Weight: 206 lb (93.441 kg) 206 lb (93.441 kg) 206 lb (93.441 kg)    History of present illness:   Anita Warren is a 52 y.o. female with a history of nonischemic cardiomyopathy, EF of 10-20%, AICD, hypertension, and obesity, who presented to [facility] ED 2 days ago with right flank pain. She was diagnosed with obstructive right ureteral calculi and was discharged home with pain medications and was advised to followup with urologist, Dr.Javaid. In the interim, the patient continued to have severe right flank pain associated with nausea and vomiting. The pain radiated down around the right side to the front.  She denied any fevers, but reported chills and profuse diaphoresis.  Upon re-evaluation in the ED during this presentaion, her creatinine had increased slightly and her UA was suggestive of UTI. CT of her abdomen and pelvis revealed obstructive stones with the proximal mid right ureter measuring up to 1.1 cm with secondary moderate right hydronephrosis and hydroureter. Urology was consulted and the hospitalist team was asked to  admit her.   Hospital Course:   1. Complicated Pyelonephritis in setting of obstructive ureteral calculi with right-sided hydronephrosis and hydroureter. The patient was started on antibiotic treatment with ceftriaxone. Her diuretics were withheld temporarily and she was started on IV fluid hydration. Supportive treatment was given with as needed opiate analgesics and as needed antiemetics. Urologist, Dr. Jerre Simon was consulted. He evaluated the patient and proceeded with the urological procedure. He performed a cystoscopy with right retrograde pyelogram and placement of a right ureteral stent. The patient tolerated the procedure well. Her urine culture grew out greater than 100,000 colonies of diphtherioids. Antibiotic therapy was changed from ceftriaxone to Cipro for 7 more days at the time of discharge. She was discharged in improved and stable condition. She will followup with Dr. Jerre Simon next week as scheduled.    2. NICM EF of 10-20%  The patient was continued on Coreg, and/or, and hydralazine. Her diuretics were temporally withheld while she was being hydrated. She remained compensated. Upon discharge, furosemide and Aldactone were restarted.    3. HTN  She was maintained on her antihypertensive medications as above.   4. Hypokalemia  She was repleted with potassium chloride during hospitalization. Her serum potassium was 3.5 at the time of discharge.  5. Acute renal insufficiency. The patient's creatinine had been 1.09 on 09/02/2013. It had increased to 1.25. She was urinating without difficulty. The acute renal insufficiency was secondary to postobstructive uropathy. Her renal function will need to BE reassessed at the followup appointment with Dr. Jerre Simon.  6. Anemia. The patient's hemoglobin was 12.2 on admission, but felt to 10.5. This is felt to be secondary to hemodilution from IV fluids. Her hemoglobin  will need to be followed in the outpatient setting.   Procedures:  Cystoscopy  with right retrograde pyelogram and right ureteral stent placement, 09/06/2013.  Consultations:  Urologist, Dr. Jerre Simon  Discharge Exam: Filed Vitals:   09/07/13 1426  BP: 112/77  Pulse: 86  Temp: 98.2 F (36.8 C)  Resp: 20    General: Pleasant alert 52 year old African/American woman sitting up in bed, in no acute distress. Cardiovascular: S1, S2, with a soft systolic murmur. Respiratory: Clear to auscultation bilaterally. Abdomen: Mild right CVA tenderness, positive bowel sounds, no rigidity or distention.  Discharge Instructions  Discharge Orders   Future Appointments Provider Department Dept Phone   10/03/2013 9:15 AM [facility] [place] Office [phone]   Future Orders Complete By Expires   Diet - low sodium heart healthy  As directed    Diet - low sodium heart healthy  As directed    Increase activity slowly  As directed    Increase activity slowly  As directed        Medication List    STOP taking these medications       metoCLOPramide 10 MG tablet  Commonly known as:  REGLAN      TAKE these medications       acetaminophen 500 MG tablet  Commonly known as:  TYLENOL  Take 1,000 mg by mouth as needed. For pain     ciprofloxacin 500 MG tablet  Commonly known as:  CIPRO  Take 1 tablet (500 mg total) by mouth 2 (two) times daily. For 7 days     COREG 25 MG tablet  Generic drug:  carvedilol  TAKE 1 TABLET BY MOUTH TWICE DAILY WITH A MEAL.     furosemide 40 MG tablet  Commonly known as:  LASIX  Take 1 tablet (40 mg total) by mouth daily.     hydrALAZINE 100 MG tablet  Commonly known as:  APRESOLINE  Take 100 mg by mouth 2 (two) times daily.     HYDROmorphone 2 MG tablet  Commonly known as:  DILAUDID  Take 1-2 tablets (2-4 mg total) by mouth every 4 (four) hours as needed for severe pain.     isosorbide dinitrate 30 MG tablet  Commonly known as:  ISORDIL  Take 30 mg by mouth 2 (two) times daily.     potassium  chloride 10 MEQ tablet  Commonly known as:  K-DUR  Take 1 tablet (10 mEq total) by mouth daily.     spironolactone 25 MG tablet  Commonly known as:  ALDACTONE  Take 25 mg by mouth daily.       Allergies  Allergen Reactions  . Peanut-Containing Drug Products Anaphylaxis  . Ace Inhibitors Swelling  . Morphine Nausea And Vomiting  . Demerol Rash       Follow-up Information   Follow up with Cassell Smiles., MD. Schedule an appointment as soon as possible for a visit in 1 week.   Specialty:  Internal Medicine   Contact information:   [address] [place] [address] [phone]        The results of significant diagnostics from this hospitalization (including imaging, microbiology, ancillary and laboratory) are listed below for reference.    Significant Diagnostic Studies: Ct Abdomen Pelvis Wo Contrast  09/03/2013   CLINICAL DATA:  Right flank pain, nausea vomiting  EXAM: CT ABDOMEN AND PELVIS WITHOUT CONTRAST  TECHNIQUE: Multidetector CT imaging of the abdomen and pelvis was performed following the standard protocol  without intravenous contrast.  COMPARISON:  Prior CT from 10/06/2010  FINDINGS: Marked did cardiomegaly is similar as compared to the prior exam. Pacemaker electrodes are partially visualized. There is a small pericardial effusion, also similar to prior.  Visualized lung bases are clear.  Limited noncontrast evaluation of the liver is unremarkable. Gallbladder is surgically absent. Spleen, adrenal glands, and pancreas demonstrate a normal unenhanced appearance.  Multiple obstructive stones are seen within the proximal -mid right ureter. The 2 main stones measure 1.1 and 1 cm respectively (series 2 image 40, 45). There is moderate right-sided hydronephrosis and hydroureter. The right ureter somewhat patulous along its entire course, similar to prior.  Left kidney is within normal limits without evidence of nephrolithiasis or hydronephrosis. No  stones are seen along the course of the left renal collecting system.  There is no evidence of bowel obstruction. No inflammatory changes are seen about the small or large bowel. Appendix is normal.  Bladder is largely decompressed and not well evaluated. Prostate is within normal limits.  No free air or fluid. No enlarged intra-abdominal pelvic lymph nodes. Prominent calcified atheromatous disease noted within the intra-abdominal aorta and its branch vessels.  IMPRESSION: 1. Obstructive stones within the proximal-mid right ureter measuring up to 1.1 cm with secondary moderate right hydronephrosis and hydroureter. 2. Normal appendix. Prior cholecystectomy. 3. Cardiomegaly with small pericardial effusion, similar to prior.   Electronically Signed   By: Rise Mu M.D.   On: 09/03/2013 05:45   Dg Abd 1 View  09/05/2013   CLINICAL DATA:  52 year old female with abdominal pain. Nephrolithiasis. Cholelithiasis. Initial encounter.  EXAM: ABDOMEN - 1 VIEW  COMPARISON:  CT Abdomen and Pelvis 09/03/2013.  FINDINGS: Proximal obstructing right ureteral calculi were not delineated on the scout view of the comparison. Subsequently, there position is unclear on this exam. Bilateral pelvic phleboliths are re- identified. Aortoiliac calcified atherosclerosis noted. Right upper quadrant surgical clips. Cardiomegaly with AICD lead. Non obstructed bowel gas pattern. No acute osseous abnormality identified.  IMPRESSION: Proximal obstructing right ureteral calculi poorly delineated with plain radiographic technique. Renal ultrasound may be valuable as away to re-evaluate right renal obstruction without the radiation associated with eighty repeat non contrast CT Abdomen and Pelvis.   Electronically Signed   By: Augusto Gamble M.D.   On: 09/05/2013 18:18   Dg Retrograde Pyelogram  09/06/2013   CLINICAL DATA:  Right-sided ureteral stone, renal colic, partial urinary obstruction, right-sided stent placement.  EXAM: RETROGRADE  PYELOGRAM  COMPARISON:  CT abdomen pelvis - 09/03/2013  FINDINGS: An angiographic as well as several spot fluoroscopic radiographic images of the right hemi abdomen are provided for review.  Initial images demonstrate selective cannulation and opacification of the right ureter to the level of the right renal collecting system. The previously identified nonobstructing stones within in the superior aspect of the right ureter are not definitively identified on the present examination. Multiple air bubbles are seen within the right renal collecting system.  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 There is mild pelvicaliectasis of the inferior right renal moiety.  Subsequent images demonstrate placement of a right-sided double-J ureteral stent with superior coil within an upper pole calyx and inferior coil overlying the expected location of the urinary bladder.  IMPRESSION: 1. Intraoperative right-sided retrograde pyelogram and stent placement as detailed above. Known nonobstructing stones within the superior aspect of the right ureter are not well depicted on the present examination.  2. 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  There appears to be at least mild pelvicaliectasis within the duplicated inferior moiety.   Electronically Signed   By: Simonne Come M.D.   On: 09/06/2013 17:01    Microbiology: Recent Results (from the past 240 hour(s))  URINE CULTURE     Status: None   Collection Time    09/03/13  5:18 AM      Result Value Range Status   Specimen Description URINE, CLEAN CATCH   Final   Special Requests NONE   Final   Culture  Setup Time     Final   Value: 09/03/2013 21:53     Performed at [facility] Count     Final   Value: NO GROWTH     Performed at [facility]   Culture     Final   Value: NO GROWTH     Performed at [facility]   Report Status 09/04/2013 FINAL   Final  URINE CULTURE     Status: None    Collection Time    09/05/13  9:20 AM      Result Value Range Status   Specimen Description URINE, CLEAN CATCH   Final   Special Requests NONE   Final   Culture  Setup Time     Final   Value: 09/05/2013 13:58     Performed at [facility]   Culture     Final   Value: >=100,000 COLONIES/mL DIPHTHEROIDS(CORYNEBACTERIUM SPECIES)     Note: Standardized susceptibility testing for this organism is not available.     Performed at [facility]   Report Status 09/06/2013 FINAL   Final  SURGICAL PCR SCREEN     Status: None   Collection Time    09/06/13  9:19 AM      Result Value Range Status   MRSA, PCR NEGATIVE  NEGATIVE Final   Staphylococcus aureus NEGATIVE  NEGATIVE Final   Comment:            The Xpert SA Assay (FDA     approved for NASAL specimens     in patients over 1 years of age),     is one component of     a comprehensive surveillance     program.  Test performance has     been validated by [facility] for patients greater     than or equal to 3 year old.     It is not intended     to diagnose infection nor to     guide or monitor treatment.     Labs: Basic Metabolic Panel:  Recent Labs Lab 09/03/13 0428 09/05/13 0859 09/06/13 0536 09/07/13 0553  NA 139 136 138 137  K 3.1* 3.3* 3.5 3.5  CL 102 100 105 104  CO2 23 24 25 24   GLUCOSE 180* 170* 110* 120*  BUN 24* 16 17 16   CREATININE 1.09 1.15* 1.16* 1.25*  CALCIUM 9.1 9.3 8.8 9.0   Liver Function Tests:  Recent Labs Lab 09/03/13 0428  AST 23  ALT 35  ALKPHOS 42  BILITOT 0.6  PROT 7.4  ALBUMIN 3.5    Recent Labs Lab 09/03/13 0428  LIPASE 25   No results found for this basename: AMMONIA,  in the last 168 hours CBC:  Recent Labs Lab 09/03/13 0428 09/05/13 0859 09/06/13 0536 09/07/13 0553  WBC 9.7 9.1 8.7 9.1  NEUTROABS 4.8 3.8  --   --   HGB  11.8* 12.2 10.1* 10.5*  HCT 35.6* 36.5 30.7* 31.7*  MCV 90.1 89.7 91.6 90.6  PLT 215 225 168 198   Cardiac Enzymes: No  results found for this basename: CKTOTAL, CKMB, CKMBINDEX, TROPONINI,  in the last 168 hours BNP: BNP (last 3 results)  Recent Labs  07/04/13 1307  PROBNP 995.0*   CBG: No results found for this basename: GLUCAP,  in the last 168 hours     Signed:  Welborn Keena  [facility] 09/07/2013, 3:21 PM

## 2013-09-07 NOTE — Progress Notes (Signed)
Late Entry:  AVS reviewed with patient.  Patient verbalized understanding of discharge instructions, medications and physician follow-up appointments.  Patient reports all belongings intact and in possession at time of discharge.  Patient transported by RN via w/c to main entrance for discharge.  Patient stable at time of discharge.

## 2013-09-07 NOTE — Anesthesia Postprocedure Evaluation (Signed)
  Anesthesia Post-op Note  Patient: Anita Warren  Procedure(s) Performed: Procedure(s): CYSTOSCOPY WITH RIGHT RETROGRADE PYELOGRAM; RIGHT URETERAL STENT PLACEMENT (Right)  Patient Location:   Anesthesia Type:MAC  Level of Consciousness: awake, alert , oriented and patient cooperative  Airway and Oxygen Therapy: Patient Spontanous Breathing  Post-op Pain: 2 /10, mild  Post-op Assessment: Post-op Vital signs reviewed, Patient's Cardiovascular Status Stable, Respiratory Function Stable, Patent Airway, No signs of Nausea or vomiting, Adequate PO intake and Pain level controlled  Post-op Vital Signs: Reviewed and stable  Complications: No apparent anesthesia complications

## 2013-09-08 ENCOUNTER — Encounter (HOSPITAL_COMMUNITY): Payer: Self-pay | Admitting: Internal Medicine

## 2013-09-09 NOTE — Op Note (Signed)
NAMEROSALEAH, PERSON        ACCOUNT NO.:  [id number]  MEDICAL RECORD NO.:  [id number]  LOCATION:                                 FACILITY:  PHYSICIAN:  Ky Barban, M.D.DATE OF BIRTH:  08/30/1961  DATE OF PROCEDURE:  09/06/2013 DATE OF DISCHARGE:  09/07/2013                              OPERATIVE REPORT   PREOPERATIVE DIAGNOSIS:  Right upper ureteral calculi.  POSTOPERATIVE DIAGNOSIS:  Right upper ureteral calculi.  PROCEDURE:  Cystoscopy, right retrograde pyelogram, insertion of double- J stent, size 5-French 24 cm, no string attached.  ANESTHESIA:  IV sedation.  DESCRIPTION OF PROCEDURE:  The patient in lithotomy position under IV sedation.  After usual prep and drape, a #25 cystoscope introduced into the bladder.  Right ureteral orifice catheterized with a wedge catheter. Hypaque was injected under fluoroscopic control.  The dye goes up into the upper ureter.  There is a filling defect slightly above, it is just below the UPJ.  The ureter is slightly dilated at this point above the stone, and later on I can see the stone moved into the renal pelvis and a guidewire was passed up into the renal pelvis.  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  The guidewire goes up into the upper renal calyx.  Then, under fluoroscopic control, a double-J stent 5-French 24 cm is positioned between the renal pelvis and the bladder.  Nice loop was obtained in the upper pole calyx and in the bladder.  All the instruments and guidewire has been removed.  The patient left the operating room in satisfactory condition.     Ky Barban, M.D.     MIJ/MEDQ  D:  09/06/2013  T:  09/07/2013  Job:  [id number]

## 2013-09-13 ENCOUNTER — Ambulatory Visit (HOSPITAL_COMMUNITY)
Admission: RE | Admit: 2013-09-13 | Discharge: 2013-09-13 | Disposition: A | Discharge status: Home / Self Care | Payer: PRIVATE HEALTH INSURANCE | Source: Ambulatory Visit | Attending: Urology | Admitting: Urology

## 2013-09-13 ENCOUNTER — Other Ambulatory Visit (HOSPITAL_COMMUNITY): Payer: Self-pay | Admitting: Urology

## 2013-09-13 DIAGNOSIS — R1031 Right lower quadrant pain: Secondary | ICD-10-CM | POA: Insufficient documentation

## 2013-09-13 DIAGNOSIS — Z9889 Other specified postprocedural states: Secondary | ICD-10-CM | POA: Insufficient documentation

## 2013-09-13 DIAGNOSIS — N200 Calculus of kidney: Secondary | ICD-10-CM

## 2013-09-13 DIAGNOSIS — N201 Calculus of ureter: Secondary | ICD-10-CM | POA: Insufficient documentation

## 2013-09-13 NOTE — ED Provider Notes (Signed)
Medical screening examination/treatment/procedure(s) were performed by non-physician practitioner and as supervising physician I was immediately available for consultation/collaboration.  EKG Interpretation    Date/Time:    Ventricular Rate:    PR Interval:    QRS Duration:   QT Interval:    QTC Calculation:   R Axis:     Text Interpretation:               Raeford Razor, MD 09/13/13 1807

## 2013-09-14 ENCOUNTER — Encounter (HOSPITAL_COMMUNITY): Payer: Self-pay | Admitting: Pharmacy Technician

## 2013-09-14 NOTE — Patient Instructions (Signed)
JESICCA DIPIERRO  09/14/2013   Your procedure is scheduled on:  09/16/2013  Report to [facility] at  700  AM.  Call this number if you have problems the morning of surgery: [phone]   Remember:   Do not eat food or drink liquids after midnight.   Take these medicines the morning of surgery with A SIP OF WATER: coreg, apresoline, isordil, aldactone   Do not wear jewelry, make-up or nail polish.  Do not wear lotions, powders, or perfumes.   Do not shave 48 hours prior to surgery. Men may shave face and neck.  Do not bring valuables to the hospital.  [facility] is not responsible for any belongings or valuables.               Contacts, dentures or bridgework may not be worn into surgery.  Leave suitcase in the car. After surgery it may be brought to your room.  For patients admitted to the hospital, discharge time is determined by your treatment team.               Patients discharged the day of surgery will not be allowed to drive home.  Name and phone number of your driver: family  Special Instructions: Shower using CHG 2 nights before surgery and the night before surgery.  If you shower the day of surgery use CHG.  Use special wash - you have one bottle of CHG for all showers.  You should use approximately 1/3 of the bottle for each shower.   Please read over the following fact sheets that you were given: Pain Booklet, Coughing and Deep Breathing, Surgical Site Infection Prevention, Anesthesia Post-op Instructions and Care and Recovery After Surgery Kidney Stones Kidney stones (urolithiasis) are solid masses that form inside your kidneys. The intense pain is caused by the stone moving through the kidney, ureter, bladder, and urethra (urinary tract). When the stone moves, the ureter starts to spasm around the stone. The stone is usually passed in your pee (urine).  HOME CARE  Drink enough fluids to keep your pee clear or pale yellow. This helps to get the stone  out.  Strain all pee through the provided strainer. Do not pee without peeing through the strainer, not even once. If you pee the stone out, catch it in the strainer. The stone may be as small as a grain of salt. Take this to your doctor. This will help your doctor figure out what you can do to try to prevent more kidney stones.  Only take medicine as told by your doctor.  Follow up with your doctor as told.  Get follow-up X-rays as told by your doctor. GET HELP IF: You have pain that gets worse even if you have been taking pain medicine. GET HELP RIGHT AWAY IF:   Your pain does not get better with medicine.  You have a fever or shaking chills.  Your pain increases and gets worse over 18 hours.  You have new belly (abdominal) pain.  You feel faint or pass out.  You are unable to pee. MAKE SURE YOU:   Understand these instructions.  Will watch your condition.  Will get help right away if you are not doing well or get worse. Document Released: 03/31/2008 Document Revised: 06/15/2013 Document Reviewed: 03/16/2013 [facility] Patient Information 2014 [place], [place]. Cystoscopy Cystoscopy is a procedure that is used to help your caregiver diagnose and sometimes treat conditions that affect  your lower urinary tract. Your lower urinary tract includes your bladder and the tube through which urine passes from your bladder out of your body (urethra). Cystoscopy is performed with a thin, tube-shaped instrument (cystoscope). The cystoscope has lenses and a light at the end so that your caregiver can see inside your bladder. The cystoscope is inserted at the entrance of your urethra. Your caregiver guides it through your urethra and into your bladder. There are two main types of cystoscopy:  Flexible cystoscopy (with a flexible cystoscope).  Rigid cystoscopy (with a rigid cystoscope). Cystoscopy may be recommended for many conditions, including:  Urinary tract infections.  Blood in your  urine (hematuria).  Loss of bladder control (urinary incontinence) or overactive bladder.  Unusual cells found in a urine sample.  Urinary blockage.  Painful urination. Cystoscopy may also be done to remove a sample of your tissue to be checked under a microscope (biopsy). It may also be done to remove or destroy bladder stones. LET YOUR CAREGIVER KNOW ABOUT:  Allergies to food or medicine.  Medicines taken, including vitamins, herbs, eyedrops, over-the-counter medicines, and creams.  Use of steroids (by mouth or creams).  Previous problems with anesthetics or numbing medicines.  History of bleeding problems or blood clots.  Previous surgery.  Other health problems, including diabetes and kidney problems.  Possibility of pregnancy, if this applies. PROCEDURE The area around the opening to your urethra will be cleaned. A medicine to numb your urethra (local anesthetic) is used. If a tissue sample or stone is removed during the procedure, you may be given a medicine to make you sleep (general anesthetic). Your caregiver will gently insert the tip of the cystoscope into your urethra. The cystoscope will be slowly glided through your urethra and into your bladder. Sterile fluid will flow through the cystoscope and into your bladder. The fluid will expand and stretch your bladder. This gives your caregiver a better view of your bladder walls. The procedure lasts about 15 20 minutes. AFTER THE PROCEDURE If a local anesthetic is used, you will be allowed to go home as soon as you are ready. If a general anesthetic is used, you will be taken to a recovery area until you are stable. You may have temporary bleeding and burning on urination. Document Released: 10/10/2000 Document Revised: 07/07/2012 Document Reviewed: 04/05/2012 [facility] Patient Information 2014 [place], [place]. PATIENT INSTRUCTIONS POST-ANESTHESIA  IMMEDIATELY FOLLOWING SURGERY:  Do not drive or operate machinery for the  first twenty four hours after surgery.  Do not make any important decisions for twenty four hours after surgery or while taking narcotic pain medications or sedatives.  If you develop intractable nausea and vomiting or a severe headache please notify your doctor immediately.  FOLLOW-UP:  Please make an appointment with your surgeon as instructed. You do not need to follow up with anesthesia unless specifically instructed to do so.  WOUND CARE INSTRUCTIONS (if applicable):  Keep a dry clean dressing on the anesthesia/puncture wound site if there is drainage.  Once the wound has quit draining you may leave it open to air.  Generally you should leave the bandage intact for twenty four hours unless there is drainage.  If the epidural site drains for more than 36-48 hours please call the anesthesia department.  QUESTIONS?:  Please feel free to call your physician or the hospital operator if you have any questions, and they will be happy to assist you.

## 2013-09-15 ENCOUNTER — Encounter (HOSPITAL_COMMUNITY)
Admission: RE | Admit: 2013-09-15 | Discharge: 2013-09-15 | Disposition: A | Discharge status: Home / Self Care | Payer: PRIVATE HEALTH INSURANCE | Source: Ambulatory Visit | Attending: Urology | Admitting: Urology

## 2013-09-15 ENCOUNTER — Encounter (HOSPITAL_COMMUNITY): Payer: Self-pay

## 2013-09-15 ENCOUNTER — Encounter (HOSPITAL_COMMUNITY): Payer: Self-pay | Admitting: Anesthesiology

## 2013-09-15 NOTE — OR Nursing (Signed)
Dr. Jerre Simon discussed with Dr. Jayme Cloud today about patient having surgery here at [facility].   After futher discussion it was decided that patient be treated elsewear due to cardiac history. Dr. Jerre Simon will discuss this with the patient and make additional plans for her care.  Surgery cancelled   at [facility].

## 2013-09-15 NOTE — Progress Notes (Signed)
Elease Hashimoto, from [facility] office, spoke with Dr. Jerre Simon about the fact that pt has ICD and whether or not cautery would be used during the surgery and he said no cautery will be used during the pt's surgery on 09/16/13. Dr. Jayme Cloud notified that pt had ICD and that Dr. Jerre Simon is aware but he will not be using cautery. Per Dr. Jayme Cloud no need for ICD representative to be present at surgery due to no use of cautery.

## 2013-09-16 ENCOUNTER — Encounter (HOSPITAL_COMMUNITY): Admission: RE | Payer: Self-pay | Source: Ambulatory Visit

## 2013-09-16 ENCOUNTER — Ambulatory Visit (HOSPITAL_COMMUNITY): Admission: RE | Admit: 2013-09-16 | Payer: PRIVATE HEALTH INSURANCE | Source: Ambulatory Visit | Admitting: Urology

## 2013-09-16 SURGERY — CYSTOSCOPY, WITH RETROGRADE PYELOGRAM AND URETERAL STENT INSERTION
Anesthesia: Choice | Laterality: Right

## 2013-10-03 ENCOUNTER — Encounter: Payer: PRIVATE HEALTH INSURANCE | Admitting: *Deleted

## 2013-10-11 ENCOUNTER — Encounter: Payer: Self-pay | Admitting: *Deleted

## 2013-10-14 ENCOUNTER — Encounter: Payer: Self-pay | Admitting: *Deleted

## 2013-10-20 ENCOUNTER — Encounter: Payer: Self-pay | Admitting: Internal Medicine

## 2013-10-28 ENCOUNTER — Other Ambulatory Visit: Payer: Self-pay

## 2013-10-28 ENCOUNTER — Encounter (HOSPITAL_COMMUNITY): Payer: Self-pay | Admitting: Emergency Medicine

## 2013-10-28 ENCOUNTER — Ambulatory Visit (HOSPITAL_COMMUNITY)
Admission: RE | Admit: 2013-10-28 | Discharge: 2013-10-28 | Disposition: A | Discharge status: Home / Self Care | Payer: PRIVATE HEALTH INSURANCE | Source: Ambulatory Visit | Attending: Internal Medicine | Admitting: Internal Medicine

## 2013-10-28 ENCOUNTER — Inpatient Hospital Stay (HOSPITAL_COMMUNITY)
Admission: EM | Admit: 2013-10-28 | Discharge: 2013-10-31 | DRG: 292 | Disposition: A | Discharge status: Home / Self Care | Payer: PRIVATE HEALTH INSURANCE | Source: Ambulatory Visit | Attending: Internal Medicine | Admitting: Internal Medicine

## 2013-10-28 ENCOUNTER — Other Ambulatory Visit (HOSPITAL_COMMUNITY): Payer: Self-pay | Admitting: Internal Medicine

## 2013-10-28 DIAGNOSIS — R17 Unspecified jaundice: Secondary | ICD-10-CM | POA: Diagnosis present

## 2013-10-28 DIAGNOSIS — N10 Acute tubulo-interstitial nephritis: Secondary | ICD-10-CM

## 2013-10-28 DIAGNOSIS — Z8619 Personal history of other infectious and parasitic diseases: Secondary | ICD-10-CM | POA: Diagnosis present

## 2013-10-28 DIAGNOSIS — R0602 Shortness of breath: Secondary | ICD-10-CM

## 2013-10-28 DIAGNOSIS — I5021 Acute systolic (congestive) heart failure: Secondary | ICD-10-CM | POA: Diagnosis present

## 2013-10-28 DIAGNOSIS — E669 Obesity, unspecified: Secondary | ICD-10-CM

## 2013-10-28 DIAGNOSIS — I10 Essential (primary) hypertension: Secondary | ICD-10-CM | POA: Diagnosis present

## 2013-10-28 DIAGNOSIS — N12 Tubulo-interstitial nephritis, not specified as acute or chronic: Secondary | ICD-10-CM | POA: Diagnosis present

## 2013-10-28 DIAGNOSIS — Z6833 Body mass index (BMI) 33.0-33.9, adult: Secondary | ICD-10-CM

## 2013-10-28 DIAGNOSIS — Z7982 Long term (current) use of aspirin: Secondary | ICD-10-CM

## 2013-10-28 DIAGNOSIS — N289 Disorder of kidney and ureter, unspecified: Secondary | ICD-10-CM

## 2013-10-28 DIAGNOSIS — I428 Other cardiomyopathies: Secondary | ICD-10-CM | POA: Diagnosis present

## 2013-10-28 DIAGNOSIS — I509 Heart failure, unspecified: Secondary | ICD-10-CM | POA: Diagnosis present

## 2013-10-28 DIAGNOSIS — Z79899 Other long term (current) drug therapy: Secondary | ICD-10-CM

## 2013-10-28 DIAGNOSIS — I5023 Acute on chronic systolic (congestive) heart failure: Secondary | ICD-10-CM

## 2013-10-28 DIAGNOSIS — E876 Hypokalemia: Secondary | ICD-10-CM | POA: Diagnosis present

## 2013-10-28 DIAGNOSIS — Z91199 Patient's noncompliance with other medical treatment and regimen due to unspecified reason: Secondary | ICD-10-CM

## 2013-10-28 DIAGNOSIS — A088 Other specified intestinal infections: Secondary | ICD-10-CM | POA: Diagnosis present

## 2013-10-28 DIAGNOSIS — N132 Hydronephrosis with renal and ureteral calculous obstruction: Secondary | ICD-10-CM

## 2013-10-28 DIAGNOSIS — Z95810 Presence of automatic (implantable) cardiac defibrillator: Secondary | ICD-10-CM

## 2013-10-28 DIAGNOSIS — I429 Cardiomyopathy, unspecified: Secondary | ICD-10-CM

## 2013-10-28 DIAGNOSIS — Z9119 Patient's noncompliance with other medical treatment and regimen: Secondary | ICD-10-CM

## 2013-10-28 DIAGNOSIS — I5033 Acute on chronic diastolic (congestive) heart failure: Principal | ICD-10-CM | POA: Diagnosis present

## 2013-10-28 DIAGNOSIS — Z1211 Encounter for screening for malignant neoplasm of colon: Secondary | ICD-10-CM

## 2013-10-28 HISTORY — DX: Heart failure, unspecified: I50.9

## 2013-10-28 LAB — COMPREHENSIVE METABOLIC PANEL
ALK PHOS: 37 U/L — AB (ref 39–117)
ALT: 33 U/L (ref 0–35)
AST: 23 U/L (ref 0–37)
Albumin: 3.1 g/dL — ABNORMAL LOW (ref 3.5–5.2)
BUN: 15 mg/dL (ref 6–23)
CALCIUM: 9.1 mg/dL (ref 8.4–10.5)
CO2: 23 mEq/L (ref 19–32)
Chloride: 103 mEq/L (ref 96–112)
Creatinine, Ser: 1.09 mg/dL (ref 0.50–1.10)
GFR calc Af Amer: 66 mL/min — ABNORMAL LOW (ref >90)
GFR calc non Af Amer: 57 mL/min — ABNORMAL LOW (ref >90)
GLUCOSE: 142 mg/dL — AB (ref 70–99)
Potassium: 3.8 mEq/L (ref 3.7–5.3)
Sodium: 138 mEq/L (ref 137–147)
Total Bilirubin: 1.6 mg/dL — ABNORMAL HIGH (ref 0.3–1.2)
Total Protein: 6.7 g/dL (ref 6.0–8.3)

## 2013-10-28 LAB — CBC WITH DIFFERENTIAL/PLATELET
Basophils Absolute: 0.1 10*3/uL (ref 0.0–0.1)
Basophils Relative: 1 % (ref 0–1)
EOS ABS: 0.1 10*3/uL (ref 0.0–0.7)
EOS PCT: 1 % (ref 0–5)
HCT: 40.9 % (ref 36.0–46.0)
HEMOGLOBIN: 13.4 g/dL (ref 12.0–15.0)
LYMPHS ABS: 2.9 10*3/uL (ref 0.7–4.0)
Lymphocytes Relative: 35 % (ref 12–46)
MCH: 29.6 pg (ref 26.0–34.0)
MCHC: 32.8 g/dL (ref 30.0–36.0)
MCV: 90.3 fL (ref 78.0–100.0)
MONOS PCT: 12 % (ref 3–12)
Monocytes Absolute: 1 10*3/uL (ref 0.1–1.0)
Neutro Abs: 4.4 10*3/uL (ref 1.7–7.7)
Neutrophils Relative %: 52 % (ref 43–77)
Platelets: 227 10*3/uL (ref 150–400)
RBC: 4.53 MIL/uL (ref 3.87–5.11)
RDW: 15.6 % — ABNORMAL HIGH (ref 11.5–15.5)
WBC: 8.4 10*3/uL (ref 4.0–10.5)

## 2013-10-28 LAB — TROPONIN I: TROPONIN I: 0.57 ng/mL — AB (ref <0.30)

## 2013-10-28 LAB — PRO B NATRIURETIC PEPTIDE: PRO B NATRI PEPTIDE: 15861 pg/mL — AB (ref 0–125)

## 2013-10-28 MED ORDER — CARVEDILOL 12.5 MG PO TABS
25.0000 mg | ORAL_TABLET | Freq: Two times a day (BID) | ORAL | Status: DC
  Administered 2013-10-29 – 2013-10-31 (×5): 25 mg via ORAL
  Filled 2013-10-28 (×5): qty 2

## 2013-10-28 MED ORDER — SPIRONOLACTONE 25 MG PO TABS
25.0000 mg | ORAL_TABLET | Freq: Every day | ORAL | Status: DC
  Administered 2013-10-29 – 2013-10-31 (×3): 25 mg via ORAL
  Filled 2013-10-28 (×3): qty 1

## 2013-10-28 MED ORDER — HEPARIN SODIUM (PORCINE) 5000 UNIT/ML IJ SOLN
5000.0000 [IU] | Freq: Three times a day (TID) | INTRAMUSCULAR | Status: DC
  Administered 2013-10-28 – 2013-10-31 (×8): 5000 [IU] via SUBCUTANEOUS
  Filled 2013-10-28 (×8): qty 1

## 2013-10-28 MED ORDER — POTASSIUM CHLORIDE CRYS ER 10 MEQ PO TBCR
10.0000 meq | EXTENDED_RELEASE_TABLET | Freq: Every day | ORAL | Status: DC
  Administered 2013-10-28 – 2013-10-30 (×3): 10 meq via ORAL
  Filled 2013-10-28 (×7): qty 1

## 2013-10-28 MED ORDER — FUROSEMIDE 10 MG/ML IJ SOLN
40.0000 mg | Freq: Two times a day (BID) | INTRAMUSCULAR | Status: DC
  Administered 2013-10-29 – 2013-10-31 (×5): 40 mg via INTRAVENOUS
  Filled 2013-10-28 (×5): qty 4

## 2013-10-28 MED ORDER — DIPHENHYDRAMINE HCL 25 MG PO CAPS
25.0000 mg | ORAL_CAPSULE | Freq: Four times a day (QID) | ORAL | Status: DC | PRN
  Administered 2013-10-29: 25 mg via ORAL
  Filled 2013-10-28: qty 1

## 2013-10-28 MED ORDER — MAGNESIUM OXIDE 400 (241.3 MG) MG PO TABS
400.0000 mg | ORAL_TABLET | Freq: Every day | ORAL | Status: DC
  Administered 2013-10-29 – 2013-10-31 (×3): 400 mg via ORAL
  Filled 2013-10-28 (×3): qty 1

## 2013-10-28 MED ORDER — ISOSORBIDE DINITRATE 20 MG PO TABS
30.0000 mg | ORAL_TABLET | Freq: Two times a day (BID) | ORAL | Status: DC
  Administered 2013-10-28 – 2013-10-31 (×6): 30 mg via ORAL
  Filled 2013-10-28 (×6): qty 2

## 2013-10-28 NOTE — ED Provider Notes (Signed)
CSN: [id number]     Arrival date & time 10/28/13  1522 History   First MD Initiated Contact with Patient 10/28/13 1530     Chief Complaint  Patient presents with  . Shortness of Breath   (Consider location/radiation/quality/duration/timing/severity/associated sxs/prior Treatment) The history is provided by the patient.   patient was sent in by Dr. Gerarda Fraction. She has a nonischemic cardiomyopathy with an ejection fraction of 10%. She was seen at [facility] for one night around 5 days ago. She also has had nausea vomiting and some diarrhea. She was seen in the office again had worsening shortness of breath. No chest pain. She also has a previous history of ventricular tachycardia. She was sent in for admission. He also discussed with cardiology. She's had some swelling in her legs.  Past Medical History  Diagnosis Date  . 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 HTN (hypertension)   . VT (ventricular tachycardia)     nonsustained  . Hypokalemia   . Obesity   . Pericardial effusion   . Diastolic dysfunction     grade 1  . PONV (postoperative nausea and vomiting)   . Hydronephrosis with renal and ureteral calculus obstruction 09/05/2013  . CHF (congestive heart failure)    Past Surgical History  Procedure Laterality Date  . Tonsillectomy    . Breast lumpectomy  1989    L breast- benign  . Tubal ligation    . Cholecystectomy    . Cardiac defibrillator placement  5/12    SJM by JA  . Colonoscopy  07/02/2012    Procedure: COLONOSCOPY;  Surgeon: [name], MD;  Location: [facility];  Service: Endoscopy;  Laterality: N/A;  1:15/PATIENT HAS A DEFIBRILLATOR  . Cystoscopy w/ ureteral stent placement Right 09/06/2013    Procedure: CYSTOSCOPY WITH RIGHT RETROGRADE PYELOGRAM; RIGHT URETERAL STENT PLACEMENT;  Surgeon: [name], MD;  Location: [facility] ORS;  Service: Urology;  Laterality: Right;   Family History  Problem Relation Age of Onset  . Hypertension     . Diabetes    . Colon cancer Neg Hx    History  Substance Use Topics  . Smoking status: Never Smoker   . Smokeless tobacco: Not on file  . Alcohol Use: No   OB History   Grav Para Term Preterm Abortions TAB SAB Ect Mult Living                 Review of Systems  Constitutional: Positive for fatigue. Negative for activity change and appetite change.  Eyes: Negative for pain.  Respiratory: Positive for cough and shortness of breath. Negative for chest tightness.   Cardiovascular: Negative for chest pain and leg swelling.  Gastrointestinal: Positive for nausea, vomiting and diarrhea. Negative for abdominal pain.  Genitourinary: Negative for flank pain and vaginal bleeding.  Musculoskeletal: Negative for back pain and neck stiffness.  Skin: Negative for rash.  Neurological: Negative for weakness, numbness and headaches.  Psychiatric/Behavioral: Negative for behavioral problems.    Allergies  Peanut-containing drug products; Ace inhibitors; Morphine; and Demerol  Home Medications   No current outpatient prescriptions on file. BP 141/107  Pulse 95  Temp(Src) 98.1 F (36.7 C) (Oral)  Resp 18  Ht 5\' 4"  (1.626 m)  Wt 195 lb 5.2 oz (88.6 kg)  BMI 33.51 kg/m2  SpO2 99%  LMP 10/28/1999 Physical Exam  Nursing note and vitals reviewed. Constitutional: She is oriented to person, place, and time. She appears well-developed.  Somewhat dry mucous membranes  HENT:  Head: Normocephalic and atraumatic.  Eyes: EOM are normal. Pupils are equal, round, and reactive to light.  Neck: Normal range of motion. Neck supple.  Cardiovascular: Normal rate, regular rhythm and normal heart sounds.   No murmur heard. Pulmonary/Chest: Effort normal and breath sounds normal. No respiratory distress. She has no wheezes. She has no rales. She exhibits no tenderness.  Left upper chest AICD  Abdominal: Soft. Bowel sounds are normal. She exhibits no distension. There is no tenderness. There is no  rebound and no guarding.  Musculoskeletal: Normal range of motion. She exhibits edema.  Mild bilateral lower extremity pitting edema.  Neurological: She is alert and oriented to person, place, and time. No cranial nerve deficit.  Skin: Skin is warm and dry.  Psychiatric: She has a normal mood and affect. Her speech is normal.    ED Course  Procedures (including critical care time) Labs Review Labs Reviewed  CBC WITH DIFFERENTIAL - Abnormal; Notable for the following:    RDW 15.6 (*)    All other components within normal limits  COMPREHENSIVE METABOLIC PANEL - Abnormal; Notable for the following:    Glucose, Bld 142 (*)    Albumin 3.1 (*)    Alkaline Phosphatase 37 (*)    Total Bilirubin 1.6 (*)    GFR calc non Af Amer 57 (*)    GFR calc Af Amer 66 (*)    All other components within normal limits  PRO B NATRIURETIC PEPTIDE - Abnormal; Notable for the following:    Pro B Natriuretic peptide (BNP) 15861.0 (*)    All other components within normal limits  TROPONIN I - Abnormal; Notable for the following:    Troponin I 0.57 (*)    All other components within normal limits  COMPREHENSIVE METABOLIC PANEL  CBC  PROTIME-INR   Imaging Review Dg Chest 2 View  10/28/2013   CLINICAL DATA:  Shortness of breath  EXAM: CHEST  2 VIEW  COMPARISON:  07/04/2013  FINDINGS: Heart is again enlarged in size. A defibrillator is again seen. The lungs are well aerated bilaterally. No focal infiltrate or sizable effusion is seen.  IMPRESSION: Cardiomegaly which is stable.  No acute abnormality is noted.   Electronically Signed   By: Inez Catalina M.D.   On: 10/28/2013 14:56    EKG Interpretation    Date/Time:  Friday October 28 2013 15:30:45 EST Ventricular Rate:  97 PR Interval:  166 QRS Duration: 96 QT Interval:  374 QTC Calculation: 474 R Axis:   56 Text Interpretation:  Sinus rhythm with occasional Premature ventricular complexes Nonspecific T wave abnormality Prolonged QT Abnormal ECG When  compared with ECG of 27-Apr-2012 21:08, Premature ventricular complexes are now Present Questionable change in QRS axis T wave inversion now evident in Lateral leads Confirmed by Mackey Varricchio  MD, Williamson ([id number]) on 10/28/2013 10:28:24 PM            MDM   1. CHF (congestive heart failure)   2. Acute on chronic systolic heart failure    Patient with acute on chronic systolic heart failure. BNP is severely elevated. Also has a mildly elevated troponin. She has nonischemic cardiomyopathy and has had a heart catheterization without coronary disease in the last 3 years. Discussed cardiology. Recommends admission to internal medicine.    Jasper Riling. Alvino Chapel, MD 10/28/13 2234

## 2013-10-28 NOTE — ED Notes (Signed)
Pt reports has history of CHF.  Reports was admitted to [facility] hospital Sunday night and discharged Monday.  Pt had follow up with Dr. Gerarda Fraction and was told to return here for evaluation.  Pt had cxr and was told still had chf and cardiomegaly.

## 2013-10-28 NOTE — ED Notes (Signed)
Dr. Alvino Chapel aware of elevated troponin.

## 2013-10-28 NOTE — H&P (Signed)
[facility] History and Physical  Anita Warren [id number] DOB: 1961-02-26 DOA: 10/28/2013  Referring physician: Alvino Chapel PCP: Glo Herring., MD  Specialists: Cardiology phone consutled-Dr. Harl Bowie by Dr. Alvino Chapel  Chief Complaint: SOB  HPI: Anita Warren is a 53 y.o. 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 Nolon Rod office as she had recently been seen and [facility] for a likely viral gastroenteritis. She had been coughing, having a cold and having diarrhea for the past 4-5 days and was admitted there on 10/23/13 and discharged on 10/24/13-she was told to take Mucinex on discharge home and has been doing fair although has been working a little hard read than she usually does. She states that since her cardiomyopathy and ensuing issues including AICD placement, she has not been able to walk a city block without feeling short of breath which qualifies her as at least NYHA class III. She states that at baseline she sleeps with 3 pillows and has significant to the as well as orthopnea however no paroxysmal nocturnal dyspnea or night cough. In the emergency room, BUN/creatinine 15/1.09 Troponin 0.57 ProBNP 15,861 Platelets 227  EKG = sinus rhythm, PR interval 0.12, QRS axis 45,? Placement of leads, no ST-T wave changes, borderline LVH, PVCs  Review of Systems: The patient denies current fever chills. She states that she still has some nausea and had 3 episodes of diarrhea today. No bloody not C. difficile like No tenesmus, no mucus No chest pain no  blurred vision no double vision falls no weakness  Past Medical History  Diagnosis Date  . 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 HTN (hypertension)   . VT (ventricular tachycardia)     nonsustained  . Hypokalemia   . Obesity   . Pericardial effusion   . Diastolic dysfunction     grade 1  . PONV (postoperative nausea and vomiting)   . Hydronephrosis with renal and ureteral calculus obstruction 09/05/2013  . CHF (congestive heart failure)    Past Surgical History  Procedure Laterality Date  . Tonsillectomy    . Breast lumpectomy  1989    L breast- benign  . Tubal ligation    . Cholecystectomy    . Cardiac defibrillator placement  5/12    SJM by JA  . Colonoscopy  07/02/2012    Procedure: COLONOSCOPY;  Surgeon: [name], MD;  Location: [facility];  Service: Endoscopy;  Laterality: N/A;  1:15/PATIENT HAS A DEFIBRILLATOR  . Cystoscopy w/ ureteral stent placement Right 09/06/2013    Procedure: CYSTOSCOPY WITH RIGHT RETROGRADE PYELOGRAM; RIGHT URETERAL STENT PLACEMENT;  Surgeon: [name], MD;  Location: [facility] ORS;  Service: Urology;  Laterality: Right;   Social History:  History   Social History Narrative   Lives in [place] with spouse.  3 grown children.  Previously worked in [place] at [employer].    Allergies  Allergen Reactions  . Peanut-Containing Drug Products Anaphylaxis  . Ace Inhibitors Swelling  . Morphine Nausea And Vomiting  . Demerol Rash    Family History  Problem Relation Age of Onset  . Hypertension    .  Diabetes    . Colon cancer Neg Hx      Prior to Admission medications   Medication Sig Start Date End Date Taking? Authorizing Provider  acetaminophen (TYLENOL) 500 MG tablet Take 1,000 mg by mouth as needed for moderate pain or headache. For pain   Yes Historical Provider, MD  Aspirin-Acetaminophen-Caffeine (EXCEDRIN MIGRAINE PO) Take 1 tablet by mouth daily as needed (headache).   Yes Historical  Provider, MD  COREG 25 MG tablet TAKE 1 TABLET BY MOUTH TWICE DAILY WITH A MEAL. 08/16/12  Yes Renella Cunas, MD  Dextromethorphan-Guaifenesin (ROBITUSSIN DM) 10-100 MG/5ML liquid Take 10 mLs by mouth daily.   Yes Historical Provider, MD  furosemide (LASIX) 40 MG tablet Take 1 tablet (40 mg total) by mouth daily. 07/04/13  Yes Thompson Grayer, MD  hydrALAZINE (APRESOLINE) 100 MG tablet Take 100 mg by mouth 2 (two) times daily.   Yes Historical Provider, MD  isosorbide dinitrate (ISORDIL) 30 MG tablet Take 30 mg by mouth 2 (two) times daily.    Yes Historical Provider, MD  magnesium oxide (MAG-OX) 400 MG tablet Take 400 mg by mouth daily.   Yes Historical Provider, MD  metoCLOPramide (REGLAN) 10 MG tablet Take 10 mg by mouth 2 (two) times daily.   Yes Historical Provider, MD  potassium chloride (K-DUR) 10 MEQ tablet Take 1 tablet (10 mEq total) by mouth daily. 85/6/31  Yes Delora Fuel, MD  spironolactone (ALDACTONE) 25 MG tablet Take 25 mg by mouth daily.  04/30/12  Yes Historical Provider, MD   Physical Exam: Filed Vitals:   10/28/13 1524  BP: 135/101  Pulse: 101  Temp: 98.2 F (36.8 C)  TempSrc: Oral  Resp: 22  Height: 5\' 4"  (1.626 m)  Weight: 89.614 kg (197 lb 9 oz)  SpO2: 100%     General:  EOMI, NCAT Body mass index is 33.89 kg/(m^2). pleasant well-appearing  Eyes: No pallor no icterus  ENT: JVD 10 cm no thyromegaly no submandibular  Neck: Soft supple see above  Cardiovascular: S1-S2 no murmur rub or gallop currently PMI displaced  Respiratory: Clinically clear  Abdomen: Soft no rebound no guarding  Skin: Trace to +1 lower extremity edema  Musculoskeletal: Range of motion intact  Psychiatric: Euthymic  Neurologic: Moving all 4 limbs equally  Labs on Admission:  Basic Metabolic Panel:  Recent Labs Lab 10/28/13 1612  NA 138  K 3.8  CL 103  CO2 23  GLUCOSE 142*  BUN 15  CREATININE 1.09  CALCIUM 9.1   Liver Function Tests:  Recent Labs Lab 10/28/13 1612   AST 23  ALT 33  ALKPHOS 37*  BILITOT 1.6*  PROT 6.7  ALBUMIN 3.1*   No results found for this basename: LIPASE, AMYLASE,  in the last 168 hours No results found for this basename: AMMONIA,  in the last 168 hours CBC:  Recent Labs Lab 10/28/13 1612  WBC 8.4  NEUTROABS 4.4  HGB 13.4  HCT 40.9  MCV 90.3  PLT 227   Cardiac Enzymes:  Recent Labs Lab 10/28/13 1612  TROPONINI 0.57*    BNP (last 3 results)  Recent Labs  07/04/13 1307 10/28/13 1612  PROBNP 995.0* 15861.0*   CBG: No results found for this basename: GLUCAP,  in the last 168 hours  Radiological Exams on Admission: Dg Chest 2 View  10/28/2013   CLINICAL DATA:  Shortness of breath  EXAM: CHEST  2 VIEW  COMPARISON:  07/04/2013  FINDINGS: Heart is again enlarged in size. A defibrillator  is again seen. The lungs are well aerated bilaterally. No focal infiltrate or sizable effusion is seen.  IMPRESSION: Cardiomegaly which is stable.  No acute abnormality is noted.   Electronically Signed   By: Inez Catalina M.D.   On: 10/28/2013 14:56   Assessment/Plan Principal Problem:   Acute on chronic systolic heart failure-JVD slightly elevated to 10 cm, lower extremity edema slight nonpitting. Will give her a higher dose of Lasix probably 40 mg twice a day for the next day to day and a half, check electrolytes, check daily weights, strict I/O.  At this stage no need to repeat echocardiogram. If she decompensates at needs further care, she will Need to be evaluated probably at [facility] as there is no cardiology availability here at [facility] over the weekend Active Problems:   HYPERTENSION-would continue patient's Coreg 25 twice a day and Aldactone 25 mg in addition to IV Lasix-would hold on hydralazine as this can cause large swelling   CARDIOMYOPATHY, 2/2 to Virus-continue meds as above, in addition continue Imdur 30 twice a day   Obesity-monitor   H/O viral gastroenteritis-hopefully resolving. I noticed that she is on  metoclopramide.  this should be held for now-low threshold to get C. difficile PCR given recent antibiotic use   Recent complicated pyelonephritis-stable currently.   Isolated hyperbilirubinemia-needs outpatient re\re evaluation. He secondary to cardiomyopathy causing CVC liver-get INR as well as CBC a.m..  Full code Son at bedside Obs telemetry 2-3 days  Time spent: [place], [facility] Pager [phone]  If 7PM-7AM, please contact night-coverage [email or web address] Password [email or web address] 10/28/2013, 5:28 PM

## 2013-10-29 DIAGNOSIS — I5021 Acute systolic (congestive) heart failure: Secondary | ICD-10-CM | POA: Diagnosis present

## 2013-10-29 LAB — BILIRUBIN, FRACTIONATED(TOT/DIR/INDIR)
BILIRUBIN TOTAL: 1.4 mg/dL — AB (ref 0.3–1.2)
Bilirubin, Direct: 0.5 mg/dL — ABNORMAL HIGH (ref 0.0–0.3)
Indirect Bilirubin: 0.9 mg/dL (ref 0.3–0.9)

## 2013-10-29 LAB — CBC
HCT: 35.6 % — ABNORMAL LOW (ref 36.0–46.0)
HEMOGLOBIN: 12.1 g/dL (ref 12.0–15.0)
MCH: 30.6 pg (ref 26.0–34.0)
MCHC: 34 g/dL (ref 30.0–36.0)
MCV: 90.1 fL (ref 78.0–100.0)
Platelets: 226 10*3/uL (ref 150–400)
RBC: 3.95 MIL/uL (ref 3.87–5.11)
RDW: 15.6 % — AB (ref 11.5–15.5)
WBC: 8.8 10*3/uL (ref 4.0–10.5)

## 2013-10-29 LAB — COMPREHENSIVE METABOLIC PANEL
ALBUMIN: 2.7 g/dL — AB (ref 3.5–5.2)
ALK PHOS: 33 U/L — AB (ref 39–117)
ALT: 28 U/L (ref 0–35)
AST: 17 U/L (ref 0–37)
BUN: 17 mg/dL (ref 6–23)
CALCIUM: 8.7 mg/dL (ref 8.4–10.5)
CO2: 23 mEq/L (ref 19–32)
Chloride: 103 mEq/L (ref 96–112)
Creatinine, Ser: 1.13 mg/dL — ABNORMAL HIGH (ref 0.50–1.10)
GFR calc non Af Amer: 55 mL/min — ABNORMAL LOW (ref >90)
GFR, EST AFRICAN AMERICAN: 64 mL/min — AB (ref >90)
GLUCOSE: 141 mg/dL — AB (ref 70–99)
Potassium: 3.3 mEq/L — ABNORMAL LOW (ref 3.7–5.3)
Sodium: 136 mEq/L — ABNORMAL LOW (ref 137–147)
TOTAL PROTEIN: 5.9 g/dL — AB (ref 6.0–8.3)
Total Bilirubin: 1.3 mg/dL — ABNORMAL HIGH (ref 0.3–1.2)

## 2013-10-29 LAB — PROTIME-INR
INR: 1.39 (ref 0.00–1.49)
Prothrombin Time: 16.7 seconds — ABNORMAL HIGH (ref 11.6–15.2)

## 2013-10-29 LAB — PRO B NATRIURETIC PEPTIDE: Pro B Natriuretic peptide (BNP): 8975 pg/mL — ABNORMAL HIGH (ref 0–125)

## 2013-10-29 LAB — MAGNESIUM: Magnesium: 1.9 mg/dL (ref 1.5–2.5)

## 2013-10-29 LAB — TROPONIN I: Troponin I: 0.47 ng/mL (ref <0.30)

## 2013-10-29 NOTE — Progress Notes (Addendum)
Anita Warren EUM:[id number] DOB: [birth year] DOA: 10/28/2013 PCP: Glo Herring., MD  Brief narrative: 53 y.o. 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 ed 10/28/2013 directly from Dr. Nolon Rod office. Admitted with mild volume overload, BNP 15,000 troponin 0.57, JVD 10 centimeters, grade 2 lower extremity edema and was admitted for possible mild viral illness with gastroenteritis and mildly decompensated CHF, after clearance by cardiology by telephone  Past medical history-As per Problem list Chart reviewed as below- None  Consultants:  Cardiology phone consulted by ED  Procedures:  Chest x-ray  Antibiotics:  None   Subjective  Doing fair. Did not receive Lasix last night Tolerating diet only fairly. No blurred or double vision No weakness on any one side of the body Shortness of breath at baseline Ambulatory in the room    Objective    Interim History: Did not receive Lasix, unclear why  Telemetry: Sinus rhythm with her PVCs   Objective: Filed Vitals:   10/28/13 2254 10/28/13 2256 10/29/13 0500 10/29/13 0543  BP: 122/85 126/85  121/83  Pulse: 96 98  88  Temp:    98.5 F (36.9 C)  TempSrc:    Oral  Resp: 20 20  20   Height:      Weight:   89.5 kg (197 lb 5 oz)   SpO2: 99% 97%  98%   No intake or output data in the 24 hours ending 10/29/13 1117  Exam: General: EOMI, NCAT Body mass index is 33.89 kg/(m^2). pleasant well-appearing Eyes: No pallor no icterus ENT: JVD 10 cm no thyromegaly no submandibular Neck: Soft supple see above Cardiovascular: S1-S2 no  murmur rub or gallop currently PMI displaced Respiratory: Clinically clear Abdomen: Soft no rebound no guarding Skin: Trace to +1 lower extremity edema Musculoskeletal: Range of motion intact Psychiatric: Euthymic Neurologic: Moving all 4 limbs equally   Data Reviewed: Basic Metabolic Panel:  Recent Labs Lab 10/28/13 1612 10/29/13 0537  NA 138 136*  K 3.8 3.3*  CL 103 103  CO2 23 23  GLUCOSE 142* 141*  BUN 15 17  CREATININE 1.09 1.13*  CALCIUM 9.1 8.7   Liver Function Tests:  Recent Labs Lab 10/28/13 1612 10/29/13 0537  AST 23 17  ALT 33 28  ALKPHOS 37* 33*  BILITOT 1.6* 1.3*  PROT 6.7 5.9*  ALBUMIN 3.1* 2.7*   No results found for this basename: LIPASE, AMYLASE,  in the last 168 hours No results found for this basename: AMMONIA,  in the last 168 hours CBC:  Recent Labs Lab 10/28/13 1612 10/29/13 0537  WBC 8.4 8.8  NEUTROABS 4.4  --   HGB 13.4 12.1  HCT 40.9 35.6*  MCV 90.3 90.1  PLT 227 226   Cardiac Enzymes:  Recent Labs Lab 10/28/13 1612  TROPONINI 0.57*   BNP: No components found with this basename: POCBNP,  CBG: No results found for this basename: GLUCAP,  in the last 168 hours  No results found for this or any previous visit (from the past 240 hour(s)).   Studies:              All Imaging reviewed and is as per above notation   Scheduled Meds: .  carvedilol  25 mg Oral BID WC  . furosemide  40 mg Intravenous BID  . heparin  5,000 Units Subcutaneous Q8H  . isosorbide dinitrate  30 mg Oral BID  . magnesium oxide  400 mg Oral Daily  . potassium chloride  10 mEq Oral Daily  . spironolactone  25 mg Oral Daily   Continuous Infusions:    Assessment/Plan:  1. Acute on chronic systolic heart failure-JVD slightly elevated to 10 cm, lower extremity edema slight nonpitting. Will give her a higher dose of Lasix probably 40 mg twice a day for the next day to day and a half, check electrolytes, check daily weights, strict I/O. At this stage no need  to repeat echocardiogram. Check magnesium, administer potassium if drops below 3.0 2. Elevated troponin-etiology without chest pain includes viral illness, decompensated heart failure. 3. HYPERTENSION-would continue patient's Coreg 25 twice a day and Aldactone 25 mg in addition to IV Lasix-would hold on hydralazine as this can cause lower extremity swelling  4. CARDIOMYOPATHY, 2/2 to Virus-continue meds as above, in addition continue Imdur 30 twice a day  5. Obesity-monitor -outpatient discussion with primary care physician 6. H/O viral gastroenteritis-hopefully resolving. I noticed that she is on metoclopramide. this should be held for now-low threshold to get C. 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 difficile and she feels fair 7. Recent complicated pyelonephritis-stable currently.  8. Isolated hyperbilirubinemia-needs outpatient re\re evaluation. INR is 1.3-obtain fractionated bilirubin-would pursue workup as an outpatient-likely secondary to chronic venous congestion of liver?     Code Status: Full Family Communication: Discussed with husband at bedside Disposition Plan: Inpatient pending resolution of symptoms   Verneita Griffes, MD  [facility] Pager [phone] 10/29/2013, 11:17 AM    LOS: 1 day

## 2013-10-29 NOTE — Progress Notes (Signed)
Utilization Review completed.  

## 2013-10-30 DIAGNOSIS — I509 Heart failure, unspecified: Secondary | ICD-10-CM

## 2013-10-30 LAB — TROPONIN I
Troponin I: <0.3 ng/mL (ref <0.30)
Troponin I: <0.3 ng/mL (ref <0.30)
Troponin I: <0.3 ng/mL (ref <0.30)

## 2013-10-30 LAB — BASIC METABOLIC PANEL
BUN: 17 mg/dL (ref 6–23)
CALCIUM: 8.7 mg/dL (ref 8.4–10.5)
CO2: 27 meq/L (ref 19–32)
Chloride: 98 mEq/L (ref 96–112)
Creatinine, Ser: 1.12 mg/dL — ABNORMAL HIGH (ref 0.50–1.10)
GFR calc Af Amer: 64 mL/min — ABNORMAL LOW (ref >90)
GFR calc non Af Amer: 55 mL/min — ABNORMAL LOW (ref >90)
GLUCOSE: 151 mg/dL — AB (ref 70–99)
POTASSIUM: 3.3 meq/L — AB (ref 3.7–5.3)
SODIUM: 135 meq/L — AB (ref 137–147)

## 2013-10-30 MED ORDER — POTASSIUM CHLORIDE CRYS ER 20 MEQ PO TBCR
40.0000 meq | EXTENDED_RELEASE_TABLET | Freq: Once | ORAL | Status: AC
  Administered 2013-10-30: 40 meq via ORAL
  Filled 2013-10-30: qty 2

## 2013-10-30 MED ORDER — ASPIRIN 81 MG PO CHEW
81.0000 mg | CHEWABLE_TABLET | Freq: Every day | ORAL | Status: DC
  Administered 2013-10-30 – 2013-10-31 (×2): 81 mg via ORAL
  Filled 2013-10-30 (×2): qty 1

## 2013-10-30 MED ORDER — ATORVASTATIN CALCIUM 10 MG PO TABS
10.0000 mg | ORAL_TABLET | Freq: Every day | ORAL | Status: DC
  Administered 2013-10-30: 10 mg via ORAL
  Filled 2013-10-30: qty 1

## 2013-10-30 MED ORDER — POTASSIUM CHLORIDE CRYS ER 20 MEQ PO TBCR
20.0000 meq | EXTENDED_RELEASE_TABLET | Freq: Every day | ORAL | Status: DC
  Administered 2013-10-31: 20 meq via ORAL
  Filled 2013-10-30: qty 1

## 2013-10-30 MED ORDER — ACETAMINOPHEN 325 MG PO TABS
650.0000 mg | ORAL_TABLET | Freq: Four times a day (QID) | ORAL | Status: DC | PRN
  Administered 2013-10-30: 650 mg via ORAL
  Filled 2013-10-30: qty 2

## 2013-10-30 NOTE — Progress Notes (Signed)
Pt had 5 beat run of VT. NP on-call notified and stated to "watch pt". Vital signs stable and pt asymptomatic. Will continue to monitor.

## 2013-10-30 NOTE — Progress Notes (Signed)
[facility]                                                                                Patient Demographics  Anita Warren, is a 53 y.o. female, DOB - 04-Dec-1960, ZCH:[id number]  Admit date - 10/28/2013   Admitting Physician Nita Sells, MD  Outpatient Primary MD for the patient is Glo Herring., MD  LOS - 2   Chief Complaint  Patient presents with  . Shortness of Breath        Assessment & Plan    1. Acute on chronic systolic heart failure - EF 10% secondary to nonischemic viral cardiomyopathy status post AICD placement. She seems to be close to being compensated, continue on Lasix, Aldactone and Coreg. Salt and fluid restriction. Has allergies with a/ARB. Cardiology to evaluate in the morning.     2. Mild troponin leak on admission. Subsequent troponin today negative, EKG has no acute changes, she is chest pain-free, have placed her on low-dose aspirin, continue Coreg at home dose, will give her 1 dose of Lipitor now and check lipid panel in the morning for secondary prevention. Likely her troponin leak was secondary to acute CHF decompensation.        3. Pyelonephritis. Stable no acute issues.      4. Highly elevated bilirubin. This is trending down, likely due to hepatic congestion from CHF. Continue follow with PCP and outpatient monitoring of bilirubin.     5.Hypokalemia - replace and recheck.     Code Status: full  Family Communication:husband  Disposition Plan: home   Procedures TTE   Consults  Cards   Medications  Scheduled Meds: . aspirin  81 mg Oral Daily  . atorvastatin  10 mg Oral q1800  . carvedilol  25 mg Oral BID WC  . furosemide  40 mg Intravenous BID  . heparin  5,000 Units Subcutaneous Q8H  . isosorbide dinitrate  30 mg Oral BID  . magnesium oxide  400 mg Oral Daily  . potassium chloride  40 mEq Oral Once  . spironolactone  25 mg Oral Daily   Continuous Infusions:  PRN  Meds:.diphenhydrAMINE  DVT Prophylaxis    Heparin    Lab Results  Component Value Date   PLT 226 10/29/2013    Antibiotics     Anti-infectives   None          Subjective:   Anita Warren today has, No headache, No chest pain, No abdominal pain - No Nausea, No new weakness tingling or numbness, No Cough - SOB.   Objective:   Filed Vitals:   10/29/13 2149 10/29/13 2337 10/30/13 0500 10/30/13 0621  BP: 116/84 98/67  127/82  Pulse: 95 82  82  Temp: 98.4 F (36.9 C)   98 F (36.7 C)  TempSrc: Oral   Oral  Resp: 20   20  Height:      Weight:   89.4 kg (197 lb 1.5 oz)   SpO2: 100% 100%  100%    Wt Readings from Last 3 Encounters:  10/30/13 89.4 kg (197 lb 1.5 oz)  09/06/13 93.441 kg (206 lb)  09/06/13 93.441 kg (206 lb)     Intake/Output  Summary (Last 24 hours) at 10/30/13 1250 Last data filed at 10/30/13 0830  Gross per 24 hour  Intake   1740 ml  Output   2175 ml  Net   -435 ml    Exam Awake Alert, Oriented X 3, No new F.N deficits, Normal affect [place].AT,PERRAL Supple Neck,No JVD, No cervical lymphadenopathy appriciated.  Symmetrical Chest wall movement, Good air movement bilaterally, CTAB RRR,No Gallops,Rubs or new Murmurs, No Parasternal Heave +ve B.Sounds, Abd Soft, Non tender, No organomegaly appriciated, No rebound - guarding or rigidity. No Cyanosis, Clubbing or edema, No new Rash or bruise     Data Review   Micro Results No results found for this or any previous visit (from the past 240 hour(s)).  Radiology Reports Dg Chest 2 View  10/28/2013   CLINICAL DATA:  Shortness of breath  EXAM: CHEST  2 VIEW  COMPARISON:  07/04/2013  FINDINGS: Heart is again enlarged in size. A defibrillator is again seen. The lungs are well aerated bilaterally. No focal infiltrate or sizable effusion is seen.  IMPRESSION: Cardiomegaly which is stable.  No acute abnormality is noted.   Electronically Signed   By: Inez Catalina M.D.   On: 10/28/2013 14:56     CBC  Recent Labs Lab 10/28/13 1612 10/29/13 0537  WBC 8.4 8.8  HGB 13.4 12.1  HCT 40.9 35.6*  PLT 227 226  MCV 90.3 90.1  MCH 29.6 30.6  MCHC 32.8 34.0  RDW 15.6* 15.6*  LYMPHSABS 2.9  --   MONOABS 1.0  --   EOSABS 0.1  --   BASOSABS 0.1  --     Chemistries   Recent Labs Lab 10/28/13 1612 10/29/13 0537 10/30/13 0937  NA 138 136* 135*  K 3.8 3.3* 3.3*  CL 103 103 98  CO2 23 23 27   GLUCOSE 142* 141* 151*  BUN 15 17 17   CREATININE 1.09 1.13* 1.12*  CALCIUM 9.1 8.7 8.7  MG  --  1.9  --   AST 23 17  --   ALT 33 28  --   ALKPHOS 37* 33*  --   BILITOT 1.6* 1.3*  1.4*  --    ------------------------------------------------------------------------------------------------------------------ estimated creatinine clearance is 63.6 ml/min (by C-G formula based on Cr of 1.12). ------------------------------------------------------------------------------------------------------------------ No results found for this basename: HGBA1C,  in the last 72 hours ------------------------------------------------------------------------------------------------------------------ No results found for this basename: CHOL, HDL, LDLCALC, TRIG, CHOLHDL, LDLDIRECT,  in the last 72 hours ------------------------------------------------------------------------------------------------------------------ No results found for this basename: TSH, T4TOTAL, FREET3, T3FREE, THYROIDAB,  in the last 72 hours ------------------------------------------------------------------------------------------------------------------ No results found for this basename: VITAMINB12, FOLATE, FERRITIN, TIBC, IRON, RETICCTPCT,  in the last 72 hours  Coagulation profile  Recent Labs Lab 10/29/13 0537  INR 1.39    No results found for this basename: DDIMER,  in the last 72 hours  Cardiac Enzymes  Recent Labs Lab 10/28/13 1612 10/29/13 0537 10/30/13 0937  TROPONINI 0.57* 0.47* <0.30    ------------------------------------------------------------------------------------------------------------------ No components found with this basename: POCBNP,      Time Spent in minutes   35   Anita Warren K M.D on 10/30/2013 at 12:50 PM  Between 7am to 7pm - Pager - [phone]  After 7pm go to [email or web address] - password [email or web address]  And look for the night coverage person covering for me after hours  [facility] Office  [phone]

## 2013-10-31 DIAGNOSIS — E875 Hyperkalemia: Secondary | ICD-10-CM

## 2013-10-31 LAB — LIPID PANEL
CHOLESTEROL: 140 mg/dL (ref 0–200)
HDL: 31 mg/dL — AB (ref >39)
LDL Cholesterol: 91 mg/dL (ref 0–99)
TRIGLYCERIDES: 90 mg/dL (ref <150)
Total CHOL/HDL Ratio: 4.5 RATIO
VLDL: 18 mg/dL (ref 0–40)

## 2013-10-31 LAB — MAGNESIUM: Magnesium: 1.9 mg/dL (ref 1.5–2.5)

## 2013-10-31 LAB — POTASSIUM: Potassium: 3.5 mEq/L — ABNORMAL LOW (ref 3.7–5.3)

## 2013-10-31 MED ORDER — HYDRALAZINE HCL 25 MG PO TABS: 25.0000 mg | ORAL_TABLET | Freq: Three times a day (TID) | ORAL | Status: DC

## 2013-10-31 MED ORDER — TORSEMIDE 20 MG PO TABS: 20.0000 mg | ORAL_TABLET | Freq: Every day | ORAL | Status: DC

## 2013-10-31 MED ORDER — ASPIRIN 81 MG PO CHEW: 81.0000 mg | CHEWABLE_TABLET | Freq: Every day | ORAL | Status: DC

## 2013-10-31 MED ORDER — POTASSIUM CHLORIDE 20 MEQ/15ML (10%) PO LIQD
20.0000 meq | Freq: Every day | ORAL | Status: DC
  Administered 2013-10-31: 20 meq via ORAL
  Filled 2013-10-31: qty 30

## 2013-10-31 NOTE — Care Management Note (Addendum)
    Page 1 of 1   10/31/2013     1:25:29 PM   CARE MANAGEMENT NOTE 10/31/2013  Patient:  Anita Warren, Anita Warren   Account Number:  [id number]  Date Initiated:  10/31/2013  Documentation initiated by:  Theophilus Kinds  Subjective/Objective Assessment:   Pt admitted from home with CHF. Pt lives with her husband and will return home at discharge. Pt is independent with ADL's.     Action/Plan:   Will assess need for home O2 prior to discharge. No other CM needs noted.   Anticipated DC Date:  11/01/2013   Anticipated DC Plan:  [place]  CM consult      Choice offered to / List presented to:             Status of service:  Completed, signed off Medicare Important Message given?   (If response is "NO", the following Medicare IM given date fields will be blank) Date Medicare IM given:   Date Additional Medicare IM given:    Discharge Disposition:  HOME/SELF CARE  Per UR Regulation:    If discussed at Long Length of Stay Meetings, dates discussed:    Comments:  10/31/13 [place], RN BSN CM Pt discharged home today. No CM needs noted. No home O2 needed.  10/31/13 [place], RN BSN CM

## 2013-10-31 NOTE — Progress Notes (Addendum)
Ambulated the patient greater than 250 feet.  She was objectively SOB but had no further complaints at this time. During the entire ambulation event her O2 saturations remained 99-100% room on room air. I notified Dr. Harl Bowie since he just left the room of the saturation.  He verbalized understanding and when asked he stated it would be okay to be discharged from his point of view.  Voiced I would notify Dr. Candiss Norse.   Notified Dr. Candiss Norse via text that he stated it was okay for discharge.

## 2013-10-31 NOTE — Consult Note (Signed)
CARDIOLOGY CONSULT NOTE   Patient ID: Anita Warren MRN: [id number] DOB/AGE: 53-Jan-1962 53 y.o.  Admit Date: 10/28/2013 Referring Physician: PTH Primary Physician: Glo Herring., MD Consulting Cardiologist: Carlyle Dolly Primary Cardiologist: Carlyle Dolly MD Reason for Consultation: CHF, NSTEMI  Clinical Summary [name] is a 53 y.o.female with multiple medical problems, and known history of NICM (EF of 10%-20%) per echo in Sept of 2014, ICD pacemaker in the setting of VT 02/2011, ([place] VR model CD1231-40Q (serial [id number]); with chronic NYHA Class III symptoms, admitted with A/C systolic CHF, and worsening orthopnea. Also found to have slight elevation in cardiac markers of 0.57 and 0.47 respectively, with normalization with 24 hours of admission.  Pro-BNP 15,861 .   According to history, she was recently admitted to [facility] for viral gastritis in December of 2014, along with cold-like symptoms. She is followed by Dr. Michela Pitcher for ureteral calculi.    She admit to being out of spironolactone and hydralazine for at least a week prior to coming in. She states that multiple MD's prescribe her medications and pharmacy was contacting the MD's for refill authorization. She also admits to eating some salty foods. 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 She does not know her dry wt.    She has been placed on IV lasix with 4 lb wt loss since admission. She is feeling and breathing better, but continues DOE. Comfortable at rest.    Allergies  Allergen Reactions  . Peanut-Containing Drug Products Anaphylaxis  . Ace Inhibitors Swelling  . Morphine Nausea And Vomiting  . Demerol Rash    Medications Scheduled Medications: . aspirin  81 mg Oral Daily  . atorvastatin  10 mg Oral q1800  . carvedilol  25 mg Oral BID WC  . furosemide  40 mg Intravenous BID  .  heparin  5,000 Units Subcutaneous Q8H  . isosorbide dinitrate  30 mg Oral BID  . magnesium oxide  400 mg Oral Daily  . potassium chloride  20 mEq Oral Daily  . spironolactone  25 mg Oral Daily      PRN Medications: acetaminophen, diphenhydrAMINE   Past Medical History  Diagnosis Date  . 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 HTN (hypertension)   . VT (ventricular tachycardia)     nonsustained  . Hypokalemia   . Obesity   . Pericardial effusion   . Diastolic dysfunction     grade 1  . PONV (postoperative nausea and vomiting)   . Hydronephrosis with renal and ureteral calculus obstruction 09/05/2013  . CHF (congestive heart failure)     Past Surgical History  Procedure Laterality Date  . Tonsillectomy    . Breast lumpectomy  1989    L breast- benign  . Tubal ligation    . Cholecystectomy    . Cardiac defibrillator placement  5/12    SJM by JA  . Colonoscopy  07/02/2012    Procedure: COLONOSCOPY;  Surgeon: [name], MD;  Location: [facility];  Service: Endoscopy;  Laterality: N/A;  1:15/PATIENT HAS A DEFIBRILLATOR  . Cystoscopy w/ ureteral stent placement Right 09/06/2013    Procedure: CYSTOSCOPY WITH RIGHT RETROGRADE PYELOGRAM; RIGHT URETERAL STENT PLACEMENT;  Surgeon: [name], MD;  Location: [facility] ORS;  Service: Urology;  Laterality: Right;    Family History  Problem Relation Age of Onset  .  Hypertension    . Diabetes    . Colon cancer Neg Hx     Social History [name] reports that she has never smoked. She does not have any smokeless tobacco history on file. [name] reports that she does not drink alcohol.  Review of Systems Otherwise reviewed and negative except as outlined.  Physical Examination Blood pressure 125/87, pulse 85, temperature 97.8 F (36.6 C), temperature source Oral, resp. rate 20, height 5\' 4"  (1.626 m), weight 194 lb 7.1 oz (88.2 kg), last menstrual period 10/28/1999, SpO2  100.00%. General: Well developed, well nourished, in no acute distress Head: Eyes PERRLA, No xanthomas.   Normal cephalic and atramatic  Lungs: Mild crackles in the bases, with upper airway wheezes, cleared with cough. Heart: HRRR S1 S2, without MRG. Tachycardic  Pulses are 2+ & equal.            No carotid bruit. No JVD.  No abdominal bruits. No femoral bruits. Abdomen: Bowel sounds are positive, abdomen soft and non-tender without masses or                  Hernia's noted. Msk:  Back normal, normal gait. Normal strength and tone for age. Extremities: No clubbing, cyanosis 1+ pretibial edema.  DP +1 Neuro: Alert and oriented X 3. Psych:  Good affect, responds appropriately   Intake/Output Summary (Last 24 hours) at 10/31/13 0927 Last data filed at 10/31/13 0630  Gross per 24 hour  Intake    480 ml  Output   2550 ml  Net  -2070 ml    Prior Cardiac Testing/Procedures Echocardiogram: 06/2013 Left ventricle: The cavity size was moderately dilated. Wall thickness was increased in a pattern of mild LVH. Systolic function was severely reduced. The estimated ejection fraction was in the range of 10% to 20%. Diffuse hypokinesis. Doppler parameters are consistent with restrictive physiology, indicative of decreased left ventricular diastolic compliance and/or increased left atrial pressure. Doppler parameters are consistent with high ventricular filling pressure. - Aortic valve: Trivial regurgitation. - Mitral valve: Mild regurgitation directed posteriorly. - Left atrium: The atrium was severely dilated. - Right ventricle: The cavity size was mildly dilated. Pacer wire or catheter noted in right ventricle. Systolic function was moderately reduced. - Right atrium: Pacer wire or catheter noted in right atrium. Central venous pressure: 5mm Hg (est). - Tricuspid valve: Mild regurgitation. - Pulmonary arteries: PA peak pressure: 23mm Hg (S). PA pressure: 15mm Hg (ED). - Pericardium,  extracardiac: A small pericardial effusion was identified circumferential to the heart.  2. Cardiac Cath 01/2011 FINDINGS:  1. Hemodynamics: LV 102/16. Aorta 100/66.  2. Renal artery evaluation: Abdominal aortogram was done showing  patent renal arteries bilaterally with no indication for renal  artery stenosis.  3. Left ventriculography: EF was estimated to be 30%. Hypokinesis  was worse towards the base than at the apex.  4. Coronary angiography: The coronary system was right dominant.  There was no angiographic coronary artery disease.  Lab Results  Basic Metabolic Panel:  Recent Labs Lab 10/28/13 1612 10/29/13 0537 10/30/13 0937 10/31/13 0452  NA 138 136* 135*  --   K 3.8 3.3* 3.3* 3.5*  CL 103 103 98  --   CO2 23 23 27   --   GLUCOSE 142* 141* 151*  --   BUN 15 17 17   --   CREATININE 1.09 1.13* 1.12*  --   CALCIUM 9.1 8.7 8.7  --   MG  --  1.9  --  1.9    Liver Function Tests:  Recent Labs Lab 10/28/13 1612 10/29/13 0537  AST 23 17  ALT 33 28  ALKPHOS 37* 33*  BILITOT 1.6* 1.3*  1.4*  PROT 6.7 5.9*  ALBUMIN 3.1* 2.7*    CBC:  Recent Labs Lab 10/28/13 1612 10/29/13 0537  WBC 8.4 8.8  NEUTROABS 4.4  --   HGB 13.4 12.1  HCT 40.9 35.6*  MCV 90.3 90.1  PLT 227 226    Cardiac Enzymes:  Recent Labs Lab 10/28/13 1612 10/29/13 0537 10/30/13 0937 10/30/13 1452 10/30/13 2012  TROPONINI 0.57* 0.47* <0.30 <0.30 <0.30    Radiology: IMPRESSION: Cardiomegaly which is stable. No acute abnormality is noted.  Impression: 1. Acute on Chronic Systolic CHF in the setting of NICM: Multifactorial with lack of spironolactone and hydralazine, plus dietary non-compliance. She is net negative 3.3 liters since admission with improvement of symptoms, still with some mild DOE. She was ambulated by nursing staff with no evidence of significant desaturation.  - recommend change home diuretic to torsemide 20mg  daily, may take bid in setting of weight gain, increased  SOB, or edema.  - please start back hydralazine, based on recent blood pressures would start 25mg  tid.   2. Hypokalemia: Now that she is back on spironolactone this should improve some. She is on potassium 10 mEq at home and has been provided replacement during this hospitalization.  .      SignedPhill Myron. Purcell Nails NP [facility] 10/31/2013, 9:27 AM Co-Sign MD  Attending Note Patient seen and discussed with NP Purcell Nails. 53 yo female NICM LVEF 10-20% NYHA III with ICD admitted with acute on chronic systolic heart failure, likely secondary to medication and dietary non-compliance. She has also had a recent upper respiratory tract infection and troubles with obstructive kidney stones that may have played a role. She has diuresed over 3 liters since admission, and is feeling better but not quite back to baseline. Recommend starting hydralazine 25mg  tid at discharge, change home lasix to toresmide 20mg  daily with instructions to take bid for the first 2 days then qday. She needs to follow up with myself of NP Anita Warren in 2 weeks. From cardiac standpoint ok to discharge today, we will sign off of inpatient care. Call with questions. Please give her refills on all her medications at discharge per her request.    Carlyle Dolly MD

## 2013-10-31 NOTE — Discharge Instructions (Signed)
Follow with Primary MD Glo Herring., MD  And your heart Dr. in 7 days   Get CBC, CMP, checked 7 days by Primary MD and again as instructed by your Primary MD.     Activity: As tolerated with Full fall precautions use walker/cane & assistance as needed   Disposition Home     Diet:  Heart Healthy,  Fluid restriction 1.8 lit/day, Aspiration precautions.  Check your Weight same time everyday, if you gain over 2 pounds, or you develop in leg swelling, experience more shortness of breath or chest pain, call your Primary MD immediately. Follow Cardiac Low Salt Diet and 1.8 lit/day fluid restriction.   On your next visit with her primary care physician please Get Medicines reviewed and adjusted.  Please request your Prim.MD to go over all Hospital Tests and Procedure/Radiological results at the follow up, please get all Hospital records sent to your Prim MD by signing hospital release before you go home.   If you experience worsening of your admission symptoms, develop shortness of breath, life threatening emergency, suicidal or homicidal thoughts you must seek medical attention immediately by calling 911 or calling your MD immediately  if symptoms less severe.  You Must read complete instructions/literature along with all the possible adverse reactions/side effects for all the Medicines you take and that have been prescribed to you. Take any new Medicines after you have completely understood and accpet all the possible adverse reactions/side effects.   Do not drive and provide baby sitting services if your were admitted for syncope or siezures until you have seen by Primary MD or a Neurologist and advised to do so again.  Do not drive when taking Pain medications.    Do not take more than prescribed Pain, Sleep and Anxiety Medications  Special Instructions: If you have smoked or chewed Tobacco  in the last 2 yrs please stop smoking, stop any regular Alcohol  and or any Recreational drug  use.  Wear Seat belts while driving.   Please note  You were cared for by a hospitalist during your hospital stay. If you have any questions about your discharge medications or the care you received while you were in the hospital after you are discharged, you can call the unit and asked to speak with the hospitalist on call if the hospitalist that took care of you is not available. Once you are discharged, your primary care physician will handle any further medical issues. Please note that NO REFILLS for any discharge medications will be authorized once you are discharged, as it is imperative that you return to your primary care physician (or establish a relationship with a primary care physician if you do not have one) for your aftercare needs so that they can reassess your need for medications and monitor your lab values.    Helvi Royals was admitted to the Hospital on 10/28/2013 and Discharged  10/31/2013 and should be excused from work/school   for 5 days starting 10/28/2013 , may return to work/school without any restrictions.  Call Lala Lund MD, [facility] [phone] with questions.  Thurnell Lose M.D on 10/31/2013,at 1:13 PM  [facility] Office  [phone]

## 2013-10-31 NOTE — Discharge Summary (Signed)
Anita Warren, is a 53 y.o. female  DOB Dec 29, 1960  MRN [id number].  Admission date:  10/28/2013  Admitting Physician  Nita Sells, MD  Discharge Date:  10/31/2013   Primary MD  Glo Herring., MD  Recommendations for primary care physician for things to follow:   Oral BMP, weight in diuretic dose closely   Admission Diagnosis  Acute on chronic systolic heart failure [409.81] CHF (congestive heart failure) [428.0]  Discharge Diagnosis   due to chronic systolic CHF  Principal Problem:   Acute on chronic systolic heart failure Active Problems:   HYPERTENSION   CARDIOMYOPATHY, 2/2 to Virus   Obesity   H/O viral gastroenteritis   Acute systolic CHF (congestive heart failure), NYHA class 3   Hyperbilirubinemia   Acute systolic CHF (congestive heart failure)      Past Medical History  Diagnosis Date  . 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 HTN (hypertension)   . VT (ventricular tachycardia)     nonsustained  . Hypokalemia   . Obesity   . Pericardial effusion   . Diastolic dysfunction     grade 1  . PONV (postoperative nausea and vomiting)   . Hydronephrosis with renal and ureteral calculus obstruction 09/05/2013  . CHF (congestive heart failure)     Past Surgical History  Procedure Laterality Date  . Tonsillectomy    . Breast lumpectomy  1989    L breast- benign  . Tubal ligation    . Cholecystectomy    . Cardiac defibrillator placement  5/12    SJM by JA  . Colonoscopy  07/02/2012    Procedure: COLONOSCOPY;  Surgeon: [name], MD;  [facility]: [facility];  Service: Endoscopy;  Laterality: N/A;  1:15/PATIENT HAS A DEFIBRILLATOR  . Cystoscopy w/ ureteral stent placement Right 09/06/2013    Procedure: CYSTOSCOPY WITH RIGHT RETROGRADE PYELOGRAM; RIGHT URETERAL STENT PLACEMENT;  Surgeon: [name], MD;  Location:  [facility];  Service: Urology;  Laterality: Right;     Discharge Condition: Stable   Follow-up Information   Follow up with Glo Herring., MD. Schedule an appointment as soon as possible for a visit in 1 week.   Specialty:  Internal Medicine   Contact information:   [address] [place] [address] [phone]       Follow up with Carlyle Dolly, F, MD. Schedule an appointment as soon as possible for a visit in 1 week.   Specialty:  Cardiology   Contact information:   [address] [place] [address] [phone]         Consults obtained - cardiology   Discharge Medications      Medication List    STOP taking these medications       furosemide 40 MG tablet  Commonly known as:  LASIX      TAKE these medications       acetaminophen 500 MG tablet  Commonly known as:  TYLENOL  Take 1,000 mg by mouth as needed for moderate pain or headache. For pain     aspirin 81 MG chewable tablet  Chew 1 tablet (81 mg total) by mouth daily.     COREG 25 MG tablet  Generic drug:  carvedilol  TAKE 1 TABLET BY MOUTH TWICE DAILY WITH A MEAL.     EXCEDRIN MIGRAINE PO  Take 1 tablet by mouth daily as needed (headache).     hydrALAZINE 25 MG tablet  Commonly known as:  APRESOLINE  Take 1 tablet (25 mg total) by mouth 3 (three) times daily.     isosorbide dinitrate 30 MG tablet  Commonly known as:  ISORDIL  Take 30 mg by mouth 2 (two) times daily.     magnesium oxide 400 MG tablet  Commonly known as:  MAG-OX  Take 400 mg by mouth daily.     metoCLOPramide 10 MG tablet  Commonly known as:  REGLAN  Take 10 mg by mouth 2 (two) times daily.     potassium chloride 10 MEQ tablet  Commonly known as:  K-DUR  Take 1 tablet (10 mEq total) by mouth daily.     ROBITUSSIN DM 10-100 MG/5ML liquid  Generic drug:  Dextromethorphan-Guaifenesin  Take 10 mLs by mouth daily.     spironolactone 25 MG tablet  Commonly known as:  ALDACTONE   Take 25 mg by mouth daily.     torsemide 20 MG tablet  Commonly known as:  DEMADEX  Take 1 tablet (20 mg total) by mouth daily. 2 pills for 2 days then once daily         Diet and Activity recommendation: See Discharge Instructions below   Discharge Instructions     Follow with Primary MD Glo Herring., MD  And your heart Dr. in 7 days   Get CBC, CMP, checked 7 days by Primary MD and again as instructed by your Primary MD.     Activity: As tolerated with Full fall precautions use walker/cane & assistance as needed   Disposition Home     Diet:  Heart Healthy,  Fluid restriction 1.8 lit/day, Aspiration precautions.  Check your Weight same time everyday, if you gain over 2 pounds, or you develop in leg swelling, experience more shortness of breath or chest pain, call your Primary MD immediately. Follow Cardiac Low Salt Diet and 1.8 lit/day fluid restriction.   On your next visit with her primary care physician please Get Medicines reviewed and adjusted.  Please request your Prim.MD to go over all Hospital Tests and Procedure/Radiological results at the follow up, please get all Hospital records sent to your Prim MD by signing hospital release before you go home.   If you experience worsening of your admission symptoms, develop shortness of breath, life threatening emergency, suicidal or homicidal thoughts you must seek medical attention immediately by calling 911 or calling your MD immediately  if symptoms less severe.  You Must read complete instructions/literature along with all the possible adverse reactions/side effects for all the Medicines you take and that have been prescribed to you. Take any new Medicines after you have completely understood and accpet all the possible adverse reactions/side effects.   Do not drive and provide baby sitting services if your were admitted for syncope or siezures until you have seen by Primary MD or a Neurologist and advised to do so  again.  Do not drive when taking Pain medications.    Do not take more than prescribed Pain,  Sleep and Anxiety Medications  Special Instructions: If you have smoked or chewed Tobacco  in the last 2 yrs please stop smoking, stop any regular Alcohol  and or any Recreational drug use.  Wear Seat belts while driving.   Please note  You were cared for by a hospitalist during your hospital stay. If you have any questions about your discharge medications or the care you received while you were in the hospital after you are discharged, you can call the unit and asked to speak with the hospitalist on call if the hospitalist that took care of you is not available. Once you are discharged, your primary care physician will handle any further medical issues. Please note that NO REFILLS for any discharge medications will be authorized once you are discharged, as it is imperative that you return to your primary care physician (or establish a relationship with a primary care physician if you do not have one) for your aftercare needs so that they can reassess your need for medications and monitor your lab values.    Makiyah Hauver was admitted to the Hospital on 10/28/2013 and Discharged  10/31/2013 and should be excused from work/school   for 5 days starting 10/28/2013 , may return to work/school without any restrictions.  Call Lala Lund MD, [facility] [phone] with questions.  Thurnell Lose M.D on 10/31/2013,at 1:13 PM  [facility]  [phone]   Major procedures and Radiology Reports - PLEASE review detailed and final reports for all details, in brief -       Dg Chest 2 View  10/28/2013   CLINICAL DATA:  Shortness of breath  EXAM: CHEST  2 VIEW  COMPARISON:  07/04/2013  FINDINGS: Heart is again enlarged in size. A defibrillator is again seen. The lungs are well aerated bilaterally. No focal infiltrate or sizable effusion is seen.  IMPRESSION: Cardiomegaly  which is stable.  No acute abnormality is noted.   Electronically Signed   By: Inez Catalina M.D.   On: 10/28/2013 14:56    Micro Results      No results found for this or any previous visit (from the past 240 hour(s)).   History of present illness and  Hospital Course:     Kindly see H&P for history of present illness and admission details, please review complete Labs, Consult reports and Test reports for all details in brief CHEYEANNE JARMAN, is a 53 y.o. 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 Harl Bowie will requested that patient be discharged home with some medication changes which are hydralazine 25 3 times a day, switching her diuretic from Lasix to Demadex. She will follow with Dr. branch closely in the office. Note she had mild troponin rise and due to acute CHF stress, this was not a NSTEMI.     She had mildly elevated bilirubin which was secondary to hepatic congestion from CHF, bilirubin trend is improving with diuresis, we'll request PCP to kindly monitor CMP in the outpatient setting. Her other  liver enzymes were unremarkable.      Today   Subjective:   Liyana Suniga today has no headache,no chest abdominal pain,no new weakness tingling or numbness, feels much better wants to go home today.   Objective:   Blood pressure 125/87, pulse 85, temperature 97.8 F (36.6 C), temperature source Oral, resp. rate 20, height 5\' 4"  (1.626 m), weight 88.2 kg  (194 lb 7.1 oz), last menstrual period 10/28/1999, SpO2 100.00%.   Intake/Output Summary (Last 24 hours) at 10/31/13 1314 Last data filed at 10/31/13 1313  Gross per 24 hour  Intake    240 ml  Output   3350 ml  Net  -3110 ml    Exam Awake Alert, Oriented *3, No new F.N deficits, Normal affect Powell.AT,PERRAL Supple Neck,No JVD, No cervical lymphadenopathy appriciated.  Symmetrical Chest wall movement, Good air movement bilaterally, CTAB RRR,No Gallops,Rubs or new Murmurs, No Parasternal Heave +ve B.Sounds, Abd Soft, Non tender, No organomegaly appriciated, No rebound -guarding or rigidity. No Cyanosis, Clubbing or edema, No new Rash or bruise  Data Review   CBC w Diff: Lab Results  Component Value Date   WBC 8.8 10/29/2013   HGB 12.1 10/29/2013   HCT 35.6* 10/29/2013   PLT 226 10/29/2013   LYMPHOPCT 35 10/28/2013   MONOPCT 12 10/28/2013   EOSPCT 1 10/28/2013   BASOPCT 1 10/28/2013    CMP: Lab Results  Component Value Date   NA 135* 10/30/2013   K 3.5* 10/31/2013   CL 98 10/30/2013   CO2 27 10/30/2013   BUN 17 10/30/2013   CREATININE 1.12* 10/30/2013   CREATININE 1.13* 07/15/2013   PROT 5.9* 10/29/2013   ALBUMIN 2.7* 10/29/2013   BILITOT 1.3* 10/29/2013   BILITOT 1.4* 10/29/2013   ALKPHOS 33* 10/29/2013   AST 17 10/29/2013   ALT 28 10/29/2013  .   Total Time in preparing paper work, data evaluation and todays exam - 35 minutes  Thurnell Lose M.D on 10/31/2013 at 1:14 PM  [place]  [phone]

## 2013-10-31 NOTE — Progress Notes (Signed)
Pt discharged with instructions, prescriptions, and the care notes. Pt  verbalized understanding.  Pt left the floor via w/c with staff and family in stable condition.

## 2013-11-15 ENCOUNTER — Ambulatory Visit (INDEPENDENT_AMBULATORY_CARE_PROVIDER_SITE_OTHER): Payer: PRIVATE HEALTH INSURANCE | Admitting: Urology

## 2013-11-15 DIAGNOSIS — N200 Calculus of kidney: Secondary | ICD-10-CM

## 2013-11-15 DIAGNOSIS — N1330 Unspecified hydronephrosis: Secondary | ICD-10-CM

## 2013-11-16 ENCOUNTER — Other Ambulatory Visit: Payer: Self-pay | Admitting: Urology

## 2013-11-16 ENCOUNTER — Ambulatory Visit (HOSPITAL_COMMUNITY)
Admission: RE | Admit: 2013-11-16 | Discharge: 2013-11-16 | Disposition: A | Discharge status: Home / Self Care | Payer: PRIVATE HEALTH INSURANCE | Source: Ambulatory Visit | Attending: Urology | Admitting: Urology

## 2013-11-16 DIAGNOSIS — N200 Calculus of kidney: Secondary | ICD-10-CM

## 2013-11-16 DIAGNOSIS — N201 Calculus of ureter: Secondary | ICD-10-CM | POA: Insufficient documentation

## 2013-11-22 ENCOUNTER — Telehealth: Payer: Self-pay | Admitting: Cardiology

## 2013-11-22 MED ORDER — CARVEDILOL 25 MG PO TABS: 25.0000 mg | ORAL_TABLET | Freq: Two times a day (BID) | ORAL | Status: DC

## 2013-11-22 NOTE — Telephone Encounter (Signed)
Received fax refill request  Rx # [id number] Medication:  Carvedilol 25 mg tablet Qty 60 Sig:  Take one tablet by mouth twice daily with a meal Physician:  Wall

## 2013-11-22 NOTE — Telephone Encounter (Signed)
rx refilled.

## 2013-11-29 ENCOUNTER — Ambulatory Visit (INDEPENDENT_AMBULATORY_CARE_PROVIDER_SITE_OTHER): Payer: PRIVATE HEALTH INSURANCE | Admitting: Cardiology

## 2013-11-29 ENCOUNTER — Encounter (INDEPENDENT_AMBULATORY_CARE_PROVIDER_SITE_OTHER): Payer: Self-pay

## 2013-11-29 ENCOUNTER — Encounter: Payer: Self-pay | Admitting: Cardiology

## 2013-11-29 VITALS — BP 148/105 | HR 110 | Ht 64.0 in | Wt 186.0 lb

## 2013-11-29 DIAGNOSIS — I5021 Acute systolic (congestive) heart failure: Secondary | ICD-10-CM

## 2013-11-29 DIAGNOSIS — I428 Other cardiomyopathies: Secondary | ICD-10-CM

## 2013-11-29 DIAGNOSIS — I509 Heart failure, unspecified: Secondary | ICD-10-CM

## 2013-11-29 MED ORDER — CARVEDILOL 25 MG PO TABS: 25.0000 mg | ORAL_TABLET | Freq: Two times a day (BID) | ORAL | Status: DC

## 2013-11-29 MED ORDER — TORSEMIDE 20 MG PO TABS: ORAL_TABLET | ORAL | Status: DC

## 2013-11-29 MED ORDER — ISOSORBIDE DINITRATE 30 MG PO TABS: 30.0000 mg | ORAL_TABLET | Freq: Two times a day (BID) | ORAL | Status: DC

## 2013-11-29 NOTE — Progress Notes (Addendum)
Clinical Summary [name] is a 53 y.o.female seen today for hospital follow up appointment.   1. NICM - LVEF 10-20% by echo Sept 2014, she has [facility] - cath 01/2011 with patent coronaries - recent admission Jan 2015 with acute on chronic systolic heart failure, likely due to dietary non-compliance. Had also had fairly recent troubles with obstructive kidney stones that may have played a role. Mild troponin elevation thought related to acute systolic heart failure, not consistent with ACS - reports breathing has been somewhat short since discharge. + orthopnea x 3 pillows, no current LE edema but notes abdominal distension. She ran out of her torsemide a few days ago, restarted her lasix she had at home.  - hx of angiodema on ACE-I  - ran out of coreg a few days ago. Reports bp 120/80s when on coreg typically      Past Medical History  Diagnosis Date  . 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 HTN (hypertension)   . VT (ventricular tachycardia)     nonsustained  . Hypokalemia   . Obesity   . Pericardial effusion   . Diastolic dysfunction     grade 1  . PONV (postoperative nausea and vomiting)   . Hydronephrosis with renal and ureteral calculus obstruction 09/05/2013  . CHF (congestive heart failure)      Allergies  Allergen Reactions  . Peanut-Containing Drug Products Anaphylaxis  . Ace Inhibitors Swelling  . Morphine Nausea And Vomiting  . Demerol Rash     Current Outpatient Prescriptions  Medication Sig Dispense Refill  . acetaminophen (TYLENOL) 500 MG tablet Take 1,000 mg by mouth as needed for moderate pain or headache. For pain      . aspirin 81 MG chewable tablet Chew 1 tablet (81 mg total) by mouth daily.  30 tablet  0  . Aspirin-Acetaminophen-Caffeine (EXCEDRIN MIGRAINE PO) Take 1 tablet by mouth daily as needed (headache).      . carvedilol (COREG) 25 MG tablet Take 1 tablet (25 mg total) by mouth 2 (two) times daily.  60  tablet  5  . Dextromethorphan-Guaifenesin (ROBITUSSIN DM) 10-100 MG/5ML liquid Take 10 mLs by mouth daily.      . hydrALAZINE (APRESOLINE) 25 MG tablet Take 1 tablet (25 mg total) by mouth 3 (three) times daily.  90 tablet  0  . isosorbide dinitrate (ISORDIL) 30 MG tablet Take 30 mg by mouth 2 (two) times daily.       . magnesium oxide (MAG-OX) 400 MG tablet Take 400 mg by mouth daily.      . metoCLOPramide (REGLAN) 10 MG tablet Take 10 mg by mouth 2 (two) times daily.      . potassium chloride (K-DUR) 10 MEQ tablet Take 1 tablet (10 mEq total) by mouth daily.  10 tablet  0  . spironolactone (ALDACTONE) 25 MG tablet Take 25 mg by mouth daily.       Marland Kitchen torsemide (DEMADEX) 20 MG tablet Take 1 tablet (20 mg total) by mouth daily. 2 pills for 2 days then once daily  30 tablet  0   No current facility-administered medications for this visit.     Past Surgical History  Procedure Laterality Date  . Tonsillectomy    . Breast lumpectomy  1989    L breast- benign  . Tubal ligation    . Cholecystectomy    . Cardiac defibrillator placement  5/12    SJM by JA  .  Colonoscopy  07/02/2012    Procedure: COLONOSCOPY;  Surgeon: [name], MD;  [facility]: [facility];  Service: Endoscopy;  Laterality: N/A;  1:15/PATIENT HAS A DEFIBRILLATOR  . Cystoscopy w/ ureteral stent placement Right 09/06/2013    Procedure: CYSTOSCOPY WITH RIGHT RETROGRADE PYELOGRAM; RIGHT URETERAL STENT PLACEMENT;  Surgeon: [name], MD;  Location: [facility];  Service: Urology;  Laterality: Right;     Allergies  Allergen Reactions  . Peanut-Containing Drug Products Anaphylaxis  . Ace Inhibitors Swelling  . Morphine Nausea And Vomiting  . Demerol Rash      Family History  Problem Relation Age of Onset  . Hypertension    . Diabetes    . Colon cancer Neg Hx      Social History [name] reports that she has never smoked. She does not have any smokeless tobacco history on file. [name] reports that  she does not drink alcohol.   Review of Systems CONSTITUTIONAL: No weight loss, fever, chills, weakness or fatigue.  HEENT: Eyes: No visual loss, blurred vision, double vision or yellow sclerae.No hearing loss, sneezing, congestion, runny nose or sore throat.  SKIN: No rash or itching.  CARDIOVASCULAR: per HPI RESPIRATORY: per HPI.  GASTROINTESTINAL: No anorexia, nausea, vomiting or diarrhea. No abdominal pain or blood.  GENITOURINARY: No burning on urination, no polyuria NEUROLOGICAL: No headache, dizziness, syncope, paralysis, ataxia, numbness or tingling in the extremities. No change in bowel or bladder control.  MUSCULOSKELETAL: No muscle, back pain, joint pain or stiffness.  LYMPHATICS: No enlarged nodes. No history of splenectomy.  PSYCHIATRIC: No history of depression or anxiety.  ENDOCRINOLOGIC: No reports of sweating, cold or heat intolerance. No polyuria or polydipsia.  Marland Kitchen   Physical Examination p 110 bp 148/105 Wt 186 lbs BMI 32 Gen: resting comfortably, no acute distress HEENT: no scleral icterus, pupils equal round and reactive, no palptable cervical adenopathy,  CV:RRR, no m/r/g, JVD just below angle of jaw. Resp: Clear to auscultation bilaterally GI: abdomen is soft, non-tender, non-distended, normal bowel sounds, no hepatosplenomegaly MSK: extremities are warm, no edema.  Skin: warm, no rash Neuro:  no focal deficits Psych: appropriate affect   Diagnostic Studies Cardiac Cath 01/2011  FINDINGS:  1. Hemodynamics: LV 102/16. Aorta 100/66.  2. Renal artery evaluation: Abdominal aortogram was done showing  patent renal arteries bilaterally with no indication for renal  artery stenosis.  3. Left ventriculography: EF was estimated to be 30%. Hypokinesis  was worse towards the base than at the apex.  4. Coronary angiography: The coronary system was right dominant.  There was no angiographic coronary artery disease.  06/2013 Echo LVEF 10-20%, diffuse hypokinesis,  restrictive diastolic dysfunction. Mild MR, PASP 37.    Assessment and Plan  1. NICM - NYHA III, LVEF 10-20% by echo Sept 2014, she has a Research officer, political party ICD - appears mildly volume up today in clinic, will increase torsemide to 20mg  bid for 3 days then resume 20mg  daily - off her coreg for a few days, elevated bp and heart rate. She will resume today. Follow next visit, may consider titrating up her hydral/nitrate - will check BMET in 3-4 weeks with increased diuresis - follow up 1 month  She has asked me to comment on her work ability. She has severe systolic heart failure with class III symptoms. She cannot maintain moderate to high levels of exertion for extended periods of time. Its unclear how long her symptoms will remain this way, and if her cardiac function  will ever improve. She is capable of working jobs with low physical demands, or those that require mild intermittent exertion. She may at times have exacerbations of her heart failure requiring her to miss work for a few days to make or appointments or receive care in the hospital.    Addendum  12/05/13 Patient is being considered for urology procedure for kidney stones. She is at increased risk for any form of procedure given her chronic systolic heart failure, however risk is not prohibitive and I recommend proceeding with procedure as planned without any further cardiac testing or interventions. May hold aspirin as needed for procedure, please continue other cardiac meds in the perioperative period.     Arnoldo Lenis, M.D., F.A.C.C.

## 2013-11-29 NOTE — Patient Instructions (Signed)
Your physician recommends that you schedule a follow-up appointment in: 1 month with Dr. Harl Bowie. This appointment will be scheduled today before you leave.  Your physician has recommended you make the following change in your medication:  Take 40 MG of torsemide for 3 days then resume 20 MG once daily.  Continue all other medications the same.   Your physician recommends that you return for lab work in: 3-4 weeks (12-25-13) for BMET.  You can go to the following locations to get lab work done: [facility] [employer] [address] Dr. Edrick Oh office in [facility].

## 2013-12-05 ENCOUNTER — Telehealth: Payer: Self-pay | Admitting: *Deleted

## 2013-12-05 NOTE — Telephone Encounter (Signed)
Sending ammended office note from 11/29/2013 per Dr. Harl Bowie for patient upcoming surgery.

## 2013-12-05 NOTE — Telephone Encounter (Signed)
PT was just seen on 11/29/13. They are needing a surgical clearance to removed kidney stone. Fax [phone]. They state they faxed clearance request over already.

## 2013-12-05 NOTE — Telephone Encounter (Signed)
Message copied by Jolee Ewing on Mon Dec 05, 2013  1:44 PM ------      Message from: Aguilita F      Created: Mon Dec 05, 2013  1:12 PM       Please note the addendum on [place] I added to my last clinic note and forwarded to you. Can you please forward this to her urologist                  Carlyle Dolly MD ------

## 2013-12-28 ENCOUNTER — Ambulatory Visit: Payer: PRIVATE HEALTH INSURANCE | Admitting: Cardiology

## 2014-01-17 ENCOUNTER — Encounter: Payer: PRIVATE HEALTH INSURANCE | Admitting: Cardiology

## 2014-01-17 ENCOUNTER — Encounter: Payer: Self-pay | Admitting: Cardiology

## 2014-01-17 NOTE — Progress Notes (Signed)
ERROR

## 2014-02-11 ENCOUNTER — Encounter (HOSPITAL_COMMUNITY): Payer: Self-pay | Admitting: Emergency Medicine

## 2014-02-11 ENCOUNTER — Emergency Department (HOSPITAL_COMMUNITY): Payer: PRIVATE HEALTH INSURANCE

## 2014-02-11 ENCOUNTER — Emergency Department (HOSPITAL_COMMUNITY)
Admission: EM | Admit: 2014-02-11 | Discharge: 2014-02-11 | Disposition: A | Discharge status: Home / Self Care | Payer: Self-pay | Attending: Emergency Medicine | Admitting: Emergency Medicine

## 2014-02-11 DIAGNOSIS — I509 Heart failure, unspecified: Secondary | ICD-10-CM | POA: Insufficient documentation

## 2014-02-11 DIAGNOSIS — R109 Unspecified abdominal pain: Secondary | ICD-10-CM

## 2014-02-11 DIAGNOSIS — Z79899 Other long term (current) drug therapy: Secondary | ICD-10-CM | POA: Insufficient documentation

## 2014-02-11 DIAGNOSIS — E876 Hypokalemia: Secondary | ICD-10-CM | POA: Insufficient documentation

## 2014-02-11 DIAGNOSIS — Z9089 Acquired absence of other organs: Secondary | ICD-10-CM | POA: Insufficient documentation

## 2014-02-11 DIAGNOSIS — Z7982 Long term (current) use of aspirin: Secondary | ICD-10-CM | POA: Insufficient documentation

## 2014-02-11 DIAGNOSIS — R112 Nausea with vomiting, unspecified: Secondary | ICD-10-CM | POA: Insufficient documentation

## 2014-02-11 DIAGNOSIS — Z3202 Encounter for pregnancy test, result negative: Secondary | ICD-10-CM | POA: Insufficient documentation

## 2014-02-11 DIAGNOSIS — Z87442 Personal history of urinary calculi: Secondary | ICD-10-CM | POA: Insufficient documentation

## 2014-02-11 DIAGNOSIS — R1031 Right lower quadrant pain: Secondary | ICD-10-CM | POA: Insufficient documentation

## 2014-02-11 DIAGNOSIS — I10 Essential (primary) hypertension: Secondary | ICD-10-CM | POA: Insufficient documentation

## 2014-02-11 DIAGNOSIS — Z95810 Presence of automatic (implantable) cardiac defibrillator: Secondary | ICD-10-CM | POA: Insufficient documentation

## 2014-02-11 DIAGNOSIS — Z87448 Personal history of other diseases of urinary system: Secondary | ICD-10-CM | POA: Insufficient documentation

## 2014-02-11 DIAGNOSIS — E669 Obesity, unspecified: Secondary | ICD-10-CM | POA: Insufficient documentation

## 2014-02-11 LAB — COMPREHENSIVE METABOLIC PANEL
ALK PHOS: 51 U/L (ref 39–117)
ALT: 21 U/L (ref 0–35)
AST: 13 U/L (ref 0–37)
Albumin: 3.7 g/dL (ref 3.5–5.2)
BUN: 22 mg/dL (ref 6–23)
CALCIUM: 9.6 mg/dL (ref 8.4–10.5)
CO2: 28 meq/L (ref 19–32)
Chloride: 103 mEq/L (ref 96–112)
Creatinine, Ser: 1.42 mg/dL — ABNORMAL HIGH (ref 0.50–1.10)
GFR calc Af Amer: 48 mL/min — ABNORMAL LOW (ref >90)
GFR, EST NON AFRICAN AMERICAN: 42 mL/min — AB (ref >90)
GLUCOSE: 146 mg/dL — AB (ref 70–99)
Potassium: 3.5 mEq/L — ABNORMAL LOW (ref 3.7–5.3)
SODIUM: 143 meq/L (ref 137–147)
Total Bilirubin: 0.8 mg/dL (ref 0.3–1.2)
Total Protein: 8 g/dL (ref 6.0–8.3)

## 2014-02-11 LAB — CBC WITH DIFFERENTIAL/PLATELET
Basophils Absolute: 0.1 10*3/uL (ref 0.0–0.1)
Basophils Relative: 1 % (ref 0–1)
EOS PCT: 4 % (ref 0–5)
Eosinophils Absolute: 0.3 10*3/uL (ref 0.0–0.7)
HCT: 41.5 % (ref 36.0–46.0)
HEMOGLOBIN: 13.9 g/dL (ref 12.0–15.0)
LYMPHS ABS: 3.8 10*3/uL (ref 0.7–4.0)
Lymphocytes Relative: 50 % — ABNORMAL HIGH (ref 12–46)
MCH: 30.5 pg (ref 26.0–34.0)
MCHC: 33.5 g/dL (ref 30.0–36.0)
MCV: 91.2 fL (ref 78.0–100.0)
MONOS PCT: 7 % (ref 3–12)
Monocytes Absolute: 0.5 10*3/uL (ref 0.1–1.0)
Neutro Abs: 2.9 10*3/uL (ref 1.7–7.7)
Neutrophils Relative %: 38 % — ABNORMAL LOW (ref 43–77)
Platelets: 165 10*3/uL (ref 150–400)
RBC: 4.55 MIL/uL (ref 3.87–5.11)
RDW: 15.5 % (ref 11.5–15.5)
WBC: 7.6 10*3/uL (ref 4.0–10.5)

## 2014-02-11 LAB — URINALYSIS, ROUTINE W REFLEX MICROSCOPIC
BILIRUBIN URINE: NEGATIVE
Glucose, UA: NEGATIVE mg/dL
KETONES UR: NEGATIVE mg/dL
Leukocytes, UA: NEGATIVE
NITRITE: NEGATIVE
Protein, ur: NEGATIVE mg/dL
SPECIFIC GRAVITY, URINE: 1.01 (ref 1.005–1.030)
UROBILINOGEN UA: 0.2 mg/dL (ref 0.0–1.0)
pH: 6 (ref 5.0–8.0)

## 2014-02-11 LAB — URINE MICROSCOPIC-ADD ON

## 2014-02-11 LAB — POC URINE PREG, ED: Preg Test, Ur: NEGATIVE

## 2014-02-11 MED ORDER — HYDROCODONE-ACETAMINOPHEN 5-325 MG PO TABS: 1.0000 | ORAL_TABLET | Freq: Four times a day (QID) | ORAL | Status: DC | PRN

## 2014-02-11 MED ORDER — HYDROMORPHONE HCL PF 1 MG/ML IJ SOLN
1.0000 mg | Freq: Once | INTRAMUSCULAR | Status: AC
  Administered 2014-02-11: 1 mg via INTRAVENOUS
  Filled 2014-02-11: qty 1

## 2014-02-11 MED ORDER — METOCLOPRAMIDE HCL 10 MG PO TABS: 10.0000 mg | ORAL_TABLET | Freq: Three times a day (TID) | ORAL | Status: DC | PRN

## 2014-02-11 MED ORDER — SODIUM CHLORIDE 0.9 % IV BOLUS (SEPSIS)
1000.0000 mL | Freq: Once | INTRAVENOUS | Status: AC
  Administered 2014-02-11: 1000 mL via INTRAVENOUS

## 2014-02-11 MED ORDER — ONDANSETRON HCL 4 MG/2ML IJ SOLN
4.0000 mg | Freq: Once | INTRAMUSCULAR | Status: AC
  Administered 2014-02-11: 4 mg via INTRAVENOUS
  Filled 2014-02-11: qty 2

## 2014-02-11 MED ORDER — HYDROMORPHONE HCL PF 1 MG/ML IJ SOLN
1.0000 mg | Freq: Once | INTRAMUSCULAR | Status: AC
Start: 2014-02-11 — End: 2014-02-11
  Administered 2014-02-11: 1 mg via INTRAVENOUS
  Filled 2014-02-11: qty 1

## 2014-02-11 MED ORDER — PROMETHAZINE HCL 25 MG/ML IJ SOLN
25.0000 mg | Freq: Once | INTRAMUSCULAR | Status: AC
Start: 2014-02-11 — End: 2014-02-11
  Administered 2014-02-11: 25 mg via INTRAVENOUS
  Filled 2014-02-11: qty 1

## 2014-02-11 MED ORDER — ONDANSETRON HCL 4 MG/2ML IJ SOLN
INTRAMUSCULAR | Status: AC
  Filled 2014-02-11: qty 2

## 2014-02-11 MED ORDER — ONDANSETRON HCL 4 MG/2ML IJ SOLN
4.0000 mg | Freq: Once | INTRAMUSCULAR | Status: AC
  Administered 2014-02-11: 4 mg via INTRAMUSCULAR

## 2014-02-11 NOTE — ED Notes (Signed)
Patient c/o right flank pain. Per patient has 2 obstructing kidney stones on right side. Per patient diagnosed on Sep 06, 2013 and was admitted with stents placed but kidney stones did not "come out," stents removed , and patient is supposed to have supposed to have surgery. Patient seeing [facility]. Patient reports nausea and vomiting with increase pain starting today.

## 2014-02-11 NOTE — Discharge Instructions (Signed)
As discussed, Mr. important for your urologist for further evaluation and management of your abdominal pain, and kidney stones.   Please be sure to return here if you develop new, or concerning changes in your condition.

## 2014-02-11 NOTE — ED Notes (Signed)
Patient c/o pain. MD aware. Verbal orders obtained.

## 2014-02-11 NOTE — ED Provider Notes (Signed)
CSN: [id number]     Arrival date & time 02/11/14  1711 History  This chart was scribed for Anita Muskrat, MD by Roe Coombs, ED Scribe. The patient was seen in room APA04/APA04. Patient's care was started at 5:31 PM.  Chief Complaint  Patient presents with  . Flank Pain     The history is provided by the patient. No language interpreter was used.    HPI Comments: Anita Warren is a 53 y.o. female with who presents to the Emergency Department complaining of sudden onset constant, severe right flank and right lower abdominal pain that began a couple of hours ago. There is associated nausea and vomiting. Patient reports that she has 2 obstructing kidney stones on the right. In November when she was diagnosed, she was admitted and had stents placed but nephrolithiasis was not resolved with this, so her stents were removed. She last saw her urologist ([facility]) in February, and hasn't had any significant problems until today. Patient denies fever, chills, chest pain, shortness of breath, headaches or any other symptoms. She has not had any recent changes in weight. Patient has a defibrillator with a past history of CHF and cardiomyopathy since 2007. Her other medical history includes HTN.   Past Medical History  Diagnosis Date  . 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 HTN (hypertension)   . VT (ventricular tachycardia)     nonsustained  . Hypokalemia   . Obesity   . Pericardial effusion   . Diastolic dysfunction     grade 1  . PONV (postoperative nausea and vomiting)   . Hydronephrosis with renal and ureteral calculus obstruction 09/05/2013  . CHF (congestive heart failure)    Past Surgical History  Procedure Laterality Date  . Tonsillectomy    . Breast lumpectomy  1989    L breast- benign  . Tubal ligation    . Cholecystectomy    . Cardiac defibrillator placement  5/12    SJM by JA  . Colonoscopy  07/02/2012    Procedure: COLONOSCOPY;   Surgeon: [name], MD;  Location: [facility];  Service: Endoscopy;  Laterality: N/A;  1:15/PATIENT HAS A DEFIBRILLATOR  . Cystoscopy w/ ureteral stent placement Right 09/06/2013    Procedure: CYSTOSCOPY WITH RIGHT RETROGRADE PYELOGRAM; RIGHT URETERAL STENT PLACEMENT;  Surgeon: [name], MD;  Location: [facility];  Service: Urology;  Laterality: Right;   Family History  Problem Relation Age of Onset  . Hypertension    . Diabetes    . Colon cancer Neg Hx    History  Substance Use Topics  . Smoking status: Never Smoker   . Smokeless tobacco: Never Used  . Alcohol Use: No   OB History   Grav Para Term Preterm Abortions TAB SAB Ect Mult Living                 Review of Systems  Constitutional:       Per HPI, otherwise negative  HENT:       Per HPI, otherwise negative  Respiratory:       Per HPI, otherwise negative  Cardiovascular:       Per HPI, otherwise negative  Gastrointestinal: Positive for nausea, vomiting and abdominal pain. Negative for diarrhea.  Endocrine:       Negative aside from HPI  Genitourinary:       Neg aside from HPI   Musculoskeletal:       Per HPI, otherwise  negative  Skin: Negative.   Neurological: Negative for syncope.      Allergies  Peanut-containing drug products; Ace inhibitors; Morphine; and Demerol  Home Medications   Prior to Admission medications   Medication Sig Start Date End Date Taking? Authorizing Provider  acetaminophen (TYLENOL) 500 MG tablet Take 1,000 mg by mouth as needed for moderate pain or headache. For pain    Historical Provider, MD  aspirin 81 MG chewable tablet Chew 1 tablet (81 mg total) by mouth daily. 10/31/13   Thurnell Lose, MD  Aspirin-Acetaminophen-Caffeine (EXCEDRIN MIGRAINE PO) Take 1 tablet by mouth daily as needed (headache).    Historical Provider, MD  carvedilol (COREG) 25 MG tablet Take 1 tablet (25 mg total) by mouth 2 (two) times daily. 11/29/13   Arnoldo Lenis, MD   Dextromethorphan-Guaifenesin (ROBITUSSIN DM) 10-100 MG/5ML liquid Take 10 mLs by mouth daily.    Historical Provider, MD  furosemide (LASIX) 40 MG tablet Take 40 mg by mouth daily.    Historical Provider, MD  hydrALAZINE (APRESOLINE) 25 MG tablet Take 1 tablet (25 mg total) by mouth 3 (three) times daily. 10/31/13   Thurnell Lose, MD  isosorbide dinitrate (ISORDIL) 30 MG tablet Take 1 tablet (30 mg total) by mouth 2 (two) times daily. 11/29/13   Arnoldo Lenis, MD  magnesium oxide (MAG-OX) 400 MG tablet Take 400 mg by mouth daily.    Historical Provider, MD  metoCLOPramide (REGLAN) 10 MG tablet Take 10 mg by mouth as needed.     Historical Provider, MD  potassium chloride SA (K-DUR,KLOR-CON) 20 MEQ tablet Take 20 mEq by mouth daily.    Historical Provider, MD  spironolactone (ALDACTONE) 25 MG tablet Take 25 mg by mouth daily.  04/30/12   Historical Provider, MD  torsemide (DEMADEX) 20 MG tablet Take 40 MG for 3 days then resume 20 MG by mouth once daily. 11/29/13   Arnoldo Lenis, MD   Triage Vitals: BP 143/108  Pulse 120  Temp(Src) 98.3 F (36.8 C)  Resp 22  Ht 5\' 4"  (1.626 m)  Wt 180 lb (81.647 kg)  BMI 30.88 kg/m2  SpO2 100%  LMP 10/28/1999 Physical Exam  Nursing note and vitals reviewed. Constitutional: She is oriented to person, place, and time. She appears well-developed and well-nourished. No distress.  HENT:  Head: Normocephalic and atraumatic.  Eyes: Conjunctivae and EOM are normal.  Cardiovascular: Normal rate and regular rhythm.   Pulmonary/Chest: Effort normal and breath sounds normal. No stridor. No respiratory distress.  Abdominal: Soft. She exhibits no distension. There is tenderness.  Right CVA tenderness. Tenderness in right lower lateral abdomen.  Musculoskeletal: She exhibits no edema.  Neurological: She is alert and oriented to person, place, and time. No cranial nerve deficit.  Skin: Skin is warm and dry.  Psychiatric: She has a normal mood and affect.     ED Course  Procedures (including critical care time) DIAGNOSTIC STUDIES: Oxygen Saturation is 100% on room air, normal by my interpretation.    COORDINATION OF CARE: 5:37 PM- Will order x-ray of abdomen, IV fluid, Zofran, Dilaudid, and labs. Patient informed of current plan for treatment and evaluation and agrees with plan at this time.     Labs Review Labs Reviewed  CBC WITH DIFFERENTIAL - Abnormal; Notable for the following:    Neutrophils Relative % 38 (*)    Lymphocytes Relative 50 (*)    All other components within normal limits  COMPREHENSIVE METABOLIC PANEL - Abnormal; Notable for the  following:    Potassium 3.5 (*)    Glucose, Bld 146 (*)    Creatinine, Ser 1.42 (*)    GFR calc non Af Amer 42 (*)    GFR calc Af Amer 48 (*)    All other components within normal limits  URINALYSIS, ROUTINE W REFLEX MICROSCOPIC - Abnormal; Notable for the following:    Hgb urine dipstick TRACE (*)    All other components within normal limits  URINE MICROSCOPIC-ADD ON  POC URINE PREG, ED    Imaging Review Dg Abd 1 View  02/11/2014   CLINICAL DATA:  Possible right-sided stone. Pain. History of hysterectomy and gallbladder surgery. Kidney stones. Right flank pain.  EXAM: ABDOMEN - 1 VIEW  COMPARISON:  11/16/2013 plain film and CT of 09/03/2013  FINDINGS: Two supine views. Presumed phleboliths within the pelvis, similar in configuration to on the prior exam. Degenerative sclerosis of the bilateral sacroiliac joints.  Moderate amount of left-sided colonic stool. The previously described calcific density projecting at the L3-4 level is no longer identified. No other calcific densities are identified over the kidneys or expected course of the ureters.  IMPRESSION: No plain film evidence of residual right-sided urinary tract calculus.  Possible constipation.   Electronically Signed   By: Abigail Miyamoto M.D.   On: 02/11/2014 19:18    7:58 PM  On exam the patient is substantially more comfortable,  smiling, speaking clearly, and in no distress.  She continues to be afebrile.  We discussed all results thus far. With this resolution, she'll be discharged to follow up with her urologist.  8:38 PM Patient sleeping.  MDM   I personally performed the services described in this documentation, which was scribed in my presence. The recorded information has been reviewed and is accurate.   Patient with previously diagnosed kidney stones, now presents with flank pain.  On exam she is awake, alert, initially uncomfortable, but sleeping on multiple subsequent exams. Patient's labs are largely reassuring, though there is mild elevation in creatinine.  After 2 L of fluid resuscitation, patient sleeping, with no evidence of distress.  Patient is urinating, tolerating a by mouth, and is appropriate for discharge with outpatient urology followup.  Anita Muskrat, MD 02/11/14 2040

## 2014-02-21 ENCOUNTER — Ambulatory Visit (INDEPENDENT_AMBULATORY_CARE_PROVIDER_SITE_OTHER): Payer: Self-pay | Admitting: Urology

## 2014-02-21 ENCOUNTER — Other Ambulatory Visit: Payer: Self-pay | Admitting: Urology

## 2014-02-21 ENCOUNTER — Ambulatory Visit (HOSPITAL_COMMUNITY)
Admission: RE | Admit: 2014-02-21 | Discharge: 2014-02-21 | Disposition: A | Discharge status: Home / Self Care | Payer: Self-pay | Source: Ambulatory Visit | Attending: Urology | Admitting: Urology

## 2014-02-21 DIAGNOSIS — N1330 Unspecified hydronephrosis: Secondary | ICD-10-CM

## 2014-02-21 DIAGNOSIS — N200 Calculus of kidney: Secondary | ICD-10-CM | POA: Insufficient documentation

## 2014-02-28 ENCOUNTER — Ambulatory Visit: Payer: Self-pay | Admitting: Urology

## 2014-05-22 ENCOUNTER — Encounter (HOSPITAL_COMMUNITY): Payer: Self-pay | Admitting: Emergency Medicine

## 2014-05-22 ENCOUNTER — Emergency Department (HOSPITAL_COMMUNITY): Payer: Medicaid Other

## 2014-05-22 ENCOUNTER — Inpatient Hospital Stay (HOSPITAL_COMMUNITY)
Admission: EM | Admit: 2014-05-22 | Discharge: 2014-05-29 | DRG: 292 | Disposition: A | Discharge status: Home Health | Payer: Medicaid Other | Attending: Internal Medicine | Admitting: Internal Medicine

## 2014-05-22 DIAGNOSIS — I429 Cardiomyopathy, unspecified: Secondary | ICD-10-CM

## 2014-05-22 DIAGNOSIS — Z95810 Presence of automatic (implantable) cardiac defibrillator: Secondary | ICD-10-CM

## 2014-05-22 DIAGNOSIS — Z7982 Long term (current) use of aspirin: Secondary | ICD-10-CM

## 2014-05-22 DIAGNOSIS — N289 Disorder of kidney and ureter, unspecified: Secondary | ICD-10-CM

## 2014-05-22 DIAGNOSIS — N183 Chronic kidney disease, stage 3 unspecified: Secondary | ICD-10-CM | POA: Diagnosis present

## 2014-05-22 DIAGNOSIS — I519 Heart disease, unspecified: Secondary | ICD-10-CM

## 2014-05-22 DIAGNOSIS — I5023 Acute on chronic systolic (congestive) heart failure: Principal | ICD-10-CM | POA: Diagnosis present

## 2014-05-22 DIAGNOSIS — I428 Other cardiomyopathies: Secondary | ICD-10-CM | POA: Diagnosis present

## 2014-05-22 DIAGNOSIS — I509 Heart failure, unspecified: Secondary | ICD-10-CM | POA: Diagnosis present

## 2014-05-22 DIAGNOSIS — Z9114 Patient's other noncompliance with medication regimen: Secondary | ICD-10-CM

## 2014-05-22 DIAGNOSIS — Z79899 Other long term (current) drug therapy: Secondary | ICD-10-CM

## 2014-05-22 DIAGNOSIS — Z91199 Patient's noncompliance with other medical treatment and regimen due to unspecified reason: Secondary | ICD-10-CM

## 2014-05-22 DIAGNOSIS — Z833 Family history of diabetes mellitus: Secondary | ICD-10-CM

## 2014-05-22 DIAGNOSIS — E871 Hypo-osmolality and hyponatremia: Secondary | ICD-10-CM | POA: Diagnosis present

## 2014-05-22 DIAGNOSIS — Z9119 Patient's noncompliance with other medical treatment and regimen: Secondary | ICD-10-CM

## 2014-05-22 DIAGNOSIS — I10 Essential (primary) hypertension: Secondary | ICD-10-CM | POA: Diagnosis present

## 2014-05-22 DIAGNOSIS — N179 Acute kidney failure, unspecified: Secondary | ICD-10-CM | POA: Diagnosis present

## 2014-05-22 DIAGNOSIS — R0602 Shortness of breath: Secondary | ICD-10-CM

## 2014-05-22 DIAGNOSIS — N189 Chronic kidney disease, unspecified: Secondary | ICD-10-CM

## 2014-05-22 DIAGNOSIS — I129 Hypertensive chronic kidney disease with stage 1 through stage 4 chronic kidney disease, or unspecified chronic kidney disease: Secondary | ICD-10-CM | POA: Diagnosis present

## 2014-05-22 DIAGNOSIS — E876 Hypokalemia: Secondary | ICD-10-CM | POA: Diagnosis present

## 2014-05-22 HISTORY — DX: Other ventricular tachycardia: I47.29

## 2014-05-22 HISTORY — DX: Essential (primary) hypertension: I10

## 2014-05-22 HISTORY — DX: Other cardiomyopathies: I42.8

## 2014-05-22 HISTORY — DX: Patient's other noncompliance with medication regimen: Z91.14

## 2014-05-22 HISTORY — DX: Patient's other noncompliance with medication regimen for other reason: Z91.148

## 2014-05-22 HISTORY — DX: Ventricular tachycardia: I47.2

## 2014-05-22 HISTORY — DX: Chronic systolic (congestive) heart failure: I50.22

## 2014-05-22 NOTE — ED Provider Notes (Signed)
CSN: [id number]     Arrival date & time 05/22/14  2304 History  This chart was scribed for Delora Fuel, MD,  by Stacy Gardner, ED Scribe. The patient was seen in room APA19/APA19 and the patient's care was started at 11:40 PM.    First MD Initiated Contact with Patient 05/22/14 2329     Chief Complaint  Patient presents with  . Shortness of Breath     (Consider location/radiation/quality/duration/timing/severity/associated sxs/prior Treatment) Patient is a 53 y.o. female presenting with shortness of breath. The history is provided by the patient and medical records. No language interpreter was used.  Shortness of Breath Associated symptoms: chest pain and cough    HPI Comments: Anita Warren is a 53 y.o. female with hx of HTN, VT, pericardial effusion, and CHF, presents to the Emergency Department complaining of productive cough with clear sputum and SOB, onset four weeks ago and much worse last night. Pt thought the cough was getting better but it started getting worse last week. Pt is taking extra fluid pill and her last dose was four days ago. The cough is worse with lying and sitting straight. She has severe pressure in the left shoulder blade and lower sternum. Pt was unable to take isosorbide and apresoline for the past three month due to inability to afford the medication. She does not have any breathing treatments for at home use.  Denies any poor dieting habits. Past Medical History  Diagnosis Date  . 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 HTN (hypertension)   . VT (ventricular tachycardia)     nonsustained  . Hypokalemia   . Obesity   . Pericardial effusion   . Diastolic dysfunction     grade 1  . PONV (postoperative nausea and vomiting)   . Hydronephrosis with renal and ureteral calculus obstruction 09/05/2013  . CHF (congestive heart failure)    Past Surgical History  Procedure Laterality Date  . Tonsillectomy    . Breast  lumpectomy  1989    L breast- benign  . Tubal ligation    . Cholecystectomy    . Cardiac defibrillator placement  5/12    SJM by JA  . Colonoscopy  07/02/2012    Procedure: COLONOSCOPY;  Surgeon: [name], MD;  Location: [facility];  Service: Endoscopy;  Laterality: N/A;  1:15/PATIENT HAS A DEFIBRILLATOR  . Cystoscopy w/ ureteral stent placement Right 09/06/2013    Procedure: CYSTOSCOPY WITH RIGHT RETROGRADE PYELOGRAM; RIGHT URETERAL STENT PLACEMENT;  Surgeon: [name], MD;  Location: [facility];  Service: Urology;  Laterality: Right;   Family History  Problem Relation Age of Onset  . Hypertension    . Diabetes    . Colon cancer Neg Hx    History  Substance Use Topics  . Smoking status: Never Smoker   . Smokeless tobacco: Never Used  . Alcohol Use: No   OB History   Grav Para Term Preterm Abortions TAB SAB Ect Mult Living                 Review of Systems  Respiratory: Positive for cough and shortness of breath.   Cardiovascular: Positive for chest pain.  Musculoskeletal: Positive for arthralgias and myalgias.  All other systems reviewed and are negative.     Allergies  Peanut-containing drug products; Ace inhibitors; Morphine; and Demerol  Home Medications   Prior to Admission medications   Medication Sig Start Date End Date Taking? Authorizing  Provider  aspirin EC 81 MG tablet Take 81 mg by mouth daily.   Yes Historical Provider, MD  DM-GG-Chlorphen-Acetaminophen (CORICIDIN HBP DAY/NIGHT COLD PO) Take by mouth.   Yes Historical Provider, MD  hydrALAZINE (APRESOLINE) 25 MG tablet Take 1 tablet (25 mg total) by mouth 3 (three) times daily. 10/31/13  Yes Thurnell Lose, MD  potassium chloride SA (K-DUR,KLOR-CON) 20 MEQ tablet Take 20 mEq by mouth daily.   Yes Historical Provider, MD  torsemide (DEMADEX) 20 MG tablet Take 20 mg by mouth daily.   Yes Historical Provider, MD  carvedilol (COREG) 25 MG tablet Take 25 mg by mouth 2 (two) times daily with a meal.     Historical Provider, MD  HYDROcodone-acetaminophen (NORCO/VICODIN) 5-325 MG per tablet Take 1 tablet by mouth every 6 (six) hours as needed. 02/11/14   Carmin Muskrat, MD  isosorbide dinitrate (ISORDIL) 30 MG tablet Take 30 mg by mouth 2 (two) times daily.    Historical Provider, MD  metoCLOPramide (REGLAN) 10 MG tablet Take 1 tablet (10 mg total) by mouth every 8 (eight) hours as needed for nausea. 02/11/14   Carmin Muskrat, MD   BP 134/110  Pulse 114  Temp(Src) 98 F (36.7 C)  Resp 22  Ht 5\' 4"  (1.626 m)  Wt 174 lb 9 oz (79.181 kg)  BMI 29.95 kg/m2  SpO2 100%  LMP 10/28/1999 Physical Exam  Nursing note and vitals reviewed. Constitutional: She is oriented to person, place, and time. She appears well-developed and well-nourished.  dyspneic    HENT:  Head: Normocephalic.  Eyes: EOM are normal. Pupils are equal, round, and reactive to light.  Neck: Normal range of motion. Neck supple. JVD present.  jvd present at 90 degrees  Cardiovascular: Normal rate, regular rhythm and normal heart sounds.   No murmur heard. Pulmonary/Chest: Effort normal and breath sounds normal. She has no wheezes. She has no rales. She exhibits no tenderness.  Abdominal: Soft. Bowel sounds are normal. She exhibits no distension and no mass. There is no tenderness.  Musculoskeletal: Normal range of motion. She exhibits no edema.  Lymphadenopathy:    She has no cervical adenopathy.  Neurological: She is alert and oriented to person, place, and time. She has normal reflexes. No cranial nerve deficit. Coordination normal.  Skin: Skin is warm and dry. No rash noted. She is not diaphoretic.  Psychiatric: She has a normal mood and affect. Her behavior is normal. Thought content normal.    ED Course  Procedures (including critical care time) DIAGNOSTIC STUDIES: Oxygen Saturation is 100% on room air, normal by my interpretation.    COORDINATION OF CARE:  11:45 PM Discussed course of care with pt . Pt  understands and agrees.     Labs Review Results for orders placed during the hospital encounter of 05/22/14  CBC WITH DIFFERENTIAL      Result Value Ref Range   WBC 8.5  4.0 - 10.5 K/uL   RBC 4.61  3.87 - 5.11 MIL/uL   Hemoglobin 14.8  12.0 - 15.0 g/dL   HCT 42.9  36.0 - 46.0 %   MCV 93.1  78.0 - 100.0 fL   MCH 32.1  26.0 - 34.0 pg   MCHC 34.5  30.0 - 36.0 g/dL   RDW 15.2  11.5 - 15.5 %   Platelets 248  150 - 400 K/uL   Neutrophils Relative % 43  43 - 77 %   Neutro Abs 3.7  1.7 - 7.7 K/uL   Lymphocytes  Relative 46  12 - 46 %   Lymphs Abs 4.0  0.7 - 4.0 K/uL   Monocytes Relative 8  3 - 12 %   Monocytes Absolute 0.6  0.1 - 1.0 K/uL   Eosinophils Relative 2  0 - 5 %   Eosinophils Absolute 0.2  0.0 - 0.7 K/uL   Basophils Relative 1  0 - 1 %   Basophils Absolute 0.1  0.0 - 0.1 K/uL  TROPONIN I      Result Value Ref Range   Troponin I <0.30  <0.30 ng/mL  BASIC METABOLIC PANEL      Result Value Ref Range   Sodium 136 (*) 137 - 147 mEq/L   Potassium 3.6 (*) 3.7 - 5.3 mEq/L   Chloride 95 (*) 96 - 112 mEq/L   CO2 23  19 - 32 mEq/L   Glucose, Bld 131 (*) 70 - 99 mg/dL   BUN 35 (*) 6 - 23 mg/dL   Creatinine, Ser 1.84 (*) 0.50 - 1.10 mg/dL   Calcium 9.3  8.4 - 10.5 mg/dL   GFR calc non Af Amer 30 (*) >90 mL/min   GFR calc Af Amer 35 (*) >90 mL/min   Anion gap 18 (*) 5 - 15  PRO B NATRIURETIC PEPTIDE      Result Value Ref Range   Pro B Natriuretic peptide (BNP) 13927.0 (*) 0 - 125 pg/mL   Imaging Review Dg Chest 2 View  05/23/2014   CLINICAL DATA:  Cough and shortness of breath for 4 weeks. History of cardiomyopathy.  EXAM: CHEST  2 VIEW  COMPARISON:  10/28/2013  FINDINGS: Cardiac pacemaker. Diffuse cardiac enlargement without vascular congestion, similar to prior study. No focal airspace disease or consolidation in the lungs. No blunting of costophrenic angles. No pneumothorax.  IMPRESSION: Cardiac enlargement.  No evidence of active pulmonary disease.   Electronically Signed    By: Lucienne Capers M.D.   On: 05/23/2014 01:08   Images viewed by me.    EKG Interpretation   Date/Time:  Monday May 22 2014 23:51:04 EDT Ventricular Rate:  114 PR Interval:  154 QRS Duration: 112 QT Interval:  364 QTC Calculation: 501 R Axis:   48 Text Interpretation:  Sinus tachycardia Atrial premature complex Probable  anterolateral infarct, old Nonspecific ST and T wave abnormality When  compared with ECG of 10/30/2013, No significant change was found Confirmed  by [facility]  MD, Harlin Mazzoni ([id number]) on 05/23/2014 1:08:15 AM      MDM   Final diagnoses:  CHF exacerbation  Acute on chronic renal insufficiency  Hypokalemia    Dyspnea which seems to be from a CHF exacerbation. Part of her problem is medication noncompliance as she has not been taking her isosorbide or hydralazine for about the last 3 months. Curiously, do not see evidence of fluid overload on her exam. However, on review of old records, presentation is very similar to a hospitalization she had 6 months ago for CHF exacerbation. At that time, troponin was elevated. Workup is initiated.  Chest x-ray shows marked cardiomegaly but no definite evidence of pulmonary vascular congestion. Troponin is normal but BNP is significantly elevated. Worsening renal function is noted as well as mild hyponatremia and hypokalemia. She's given a dose of furosemide and will test him. She'll need to be admitted to monitor her renal function while trying to optimize her cardiac function. Case is discussed with Dr. Shanon Brow [facility] who agrees to admit the patient.    Delora Fuel,  MD 05/23/14 0124

## 2014-05-22 NOTE — ED Notes (Signed)
Pt c/o sob and cough x 3 weeks.

## 2014-05-23 DIAGNOSIS — I428 Other cardiomyopathies: Secondary | ICD-10-CM | POA: Diagnosis present

## 2014-05-23 DIAGNOSIS — N183 Chronic kidney disease, stage 3 unspecified: Secondary | ICD-10-CM | POA: Diagnosis present

## 2014-05-23 DIAGNOSIS — Z9119 Patient's noncompliance with other medical treatment and regimen: Secondary | ICD-10-CM | POA: Diagnosis not present

## 2014-05-23 DIAGNOSIS — I129 Hypertensive chronic kidney disease with stage 1 through stage 4 chronic kidney disease, or unspecified chronic kidney disease: Secondary | ICD-10-CM | POA: Diagnosis present

## 2014-05-23 DIAGNOSIS — Z91199 Patient's noncompliance with other medical treatment and regimen due to unspecified reason: Secondary | ICD-10-CM

## 2014-05-23 DIAGNOSIS — I509 Heart failure, unspecified: Secondary | ICD-10-CM | POA: Diagnosis present

## 2014-05-23 DIAGNOSIS — Z95810 Presence of automatic (implantable) cardiac defibrillator: Secondary | ICD-10-CM | POA: Diagnosis not present

## 2014-05-23 DIAGNOSIS — Z833 Family history of diabetes mellitus: Secondary | ICD-10-CM | POA: Diagnosis not present

## 2014-05-23 DIAGNOSIS — Z7982 Long term (current) use of aspirin: Secondary | ICD-10-CM | POA: Diagnosis not present

## 2014-05-23 DIAGNOSIS — Z91148 Patient's other noncompliance with medication regimen for other reason: Secondary | ICD-10-CM

## 2014-05-23 DIAGNOSIS — E871 Hypo-osmolality and hyponatremia: Secondary | ICD-10-CM | POA: Diagnosis present

## 2014-05-23 DIAGNOSIS — I10 Essential (primary) hypertension: Secondary | ICD-10-CM

## 2014-05-23 DIAGNOSIS — R0602 Shortness of breath: Secondary | ICD-10-CM | POA: Diagnosis present

## 2014-05-23 DIAGNOSIS — I5023 Acute on chronic systolic (congestive) heart failure: Principal | ICD-10-CM

## 2014-05-23 DIAGNOSIS — E876 Hypokalemia: Secondary | ICD-10-CM | POA: Diagnosis present

## 2014-05-23 DIAGNOSIS — Z9114 Patient's other noncompliance with medication regimen: Secondary | ICD-10-CM

## 2014-05-23 DIAGNOSIS — N189 Chronic kidney disease, unspecified: Secondary | ICD-10-CM

## 2014-05-23 DIAGNOSIS — N179 Acute kidney failure, unspecified: Secondary | ICD-10-CM | POA: Diagnosis present

## 2014-05-23 DIAGNOSIS — I319 Disease of pericardium, unspecified: Secondary | ICD-10-CM

## 2014-05-23 DIAGNOSIS — Z79899 Other long term (current) drug therapy: Secondary | ICD-10-CM | POA: Diagnosis not present

## 2014-05-23 LAB — CBC WITH DIFFERENTIAL/PLATELET
Basophils Absolute: 0.1 10*3/uL (ref 0.0–0.1)
Basophils Relative: 1 % (ref 0–1)
EOS ABS: 0.2 10*3/uL (ref 0.0–0.7)
EOS PCT: 2 % (ref 0–5)
HEMATOCRIT: 42.9 % (ref 36.0–46.0)
Hemoglobin: 14.8 g/dL (ref 12.0–15.0)
Lymphocytes Relative: 46 % (ref 12–46)
Lymphs Abs: 4 10*3/uL (ref 0.7–4.0)
MCH: 32.1 pg (ref 26.0–34.0)
MCHC: 34.5 g/dL (ref 30.0–36.0)
MCV: 93.1 fL (ref 78.0–100.0)
MONO ABS: 0.6 10*3/uL (ref 0.1–1.0)
Monocytes Relative: 8 % (ref 3–12)
Neutro Abs: 3.7 10*3/uL (ref 1.7–7.7)
Neutrophils Relative %: 43 % (ref 43–77)
Platelets: 248 10*3/uL (ref 150–400)
RBC: 4.61 MIL/uL (ref 3.87–5.11)
RDW: 15.2 % (ref 11.5–15.5)
WBC: 8.5 10*3/uL (ref 4.0–10.5)

## 2014-05-23 LAB — TSH: TSH: 2.79 u[IU]/mL (ref 0.350–4.500)

## 2014-05-23 LAB — BASIC METABOLIC PANEL
ANION GAP: 14 (ref 5–15)
Anion gap: 18 — ABNORMAL HIGH (ref 5–15)
BUN: 34 mg/dL — ABNORMAL HIGH (ref 6–23)
BUN: 35 mg/dL — ABNORMAL HIGH (ref 6–23)
CO2: 23 mEq/L (ref 19–32)
CO2: 26 mEq/L (ref 19–32)
CREATININE: 1.68 mg/dL — AB (ref 0.50–1.10)
CREATININE: 1.84 mg/dL — AB (ref 0.50–1.10)
Calcium: 9.1 mg/dL (ref 8.4–10.5)
Calcium: 9.3 mg/dL (ref 8.4–10.5)
Chloride: 95 mEq/L — ABNORMAL LOW (ref 96–112)
Chloride: 97 mEq/L (ref 96–112)
GFR calc Af Amer: 39 mL/min — ABNORMAL LOW (ref >90)
GFR, EST AFRICAN AMERICAN: 35 mL/min — AB (ref >90)
GFR, EST NON AFRICAN AMERICAN: 30 mL/min — AB (ref >90)
GFR, EST NON AFRICAN AMERICAN: 34 mL/min — AB (ref >90)
Glucose, Bld: 131 mg/dL — ABNORMAL HIGH (ref 70–99)
Glucose, Bld: 135 mg/dL — ABNORMAL HIGH (ref 70–99)
Potassium: 3.6 mEq/L — ABNORMAL LOW (ref 3.7–5.3)
Potassium: 4.5 mEq/L (ref 3.7–5.3)
SODIUM: 137 meq/L (ref 137–147)
Sodium: 136 mEq/L — ABNORMAL LOW (ref 137–147)

## 2014-05-23 LAB — TROPONIN I: Troponin I: <0.3 ng/mL (ref <0.30)

## 2014-05-23 LAB — PRO B NATRIURETIC PEPTIDE: Pro B Natriuretic peptide (BNP): 13927 pg/mL — ABNORMAL HIGH (ref 0–125)

## 2014-05-23 MED ORDER — ENOXAPARIN SODIUM 40 MG/0.4ML ~~LOC~~ SOLN
40.0000 mg | SUBCUTANEOUS | Status: DC
  Administered 2014-05-23 – 2014-05-29 (×7): 40 mg via SUBCUTANEOUS
  Filled 2014-05-23 (×7): qty 0.4

## 2014-05-23 MED ORDER — ONDANSETRON HCL 4 MG/2ML IJ SOLN
4.0000 mg | Freq: Four times a day (QID) | INTRAMUSCULAR | Status: DC | PRN
  Filled 2014-05-23: qty 2

## 2014-05-23 MED ORDER — SODIUM CHLORIDE 0.9 % IJ SOLN
3.0000 mL | Freq: Two times a day (BID) | INTRAMUSCULAR | Status: DC
  Administered 2014-05-23 – 2014-05-29 (×12): 3 mL via INTRAVENOUS

## 2014-05-23 MED ORDER — ONDANSETRON HCL 4 MG/2ML IJ SOLN: 4.0000 mg | Freq: Three times a day (TID) | INTRAMUSCULAR | Status: DC | PRN

## 2014-05-23 MED ORDER — ASPIRIN EC 81 MG PO TBEC
81.0000 mg | DELAYED_RELEASE_TABLET | Freq: Every day | ORAL | Status: DC
  Administered 2014-05-23 – 2014-05-29 (×7): 81 mg via ORAL
  Filled 2014-05-23 (×6): qty 1

## 2014-05-23 MED ORDER — HYDRALAZINE HCL 25 MG PO TABS
25.0000 mg | ORAL_TABLET | Freq: Three times a day (TID) | ORAL | Status: DC
  Administered 2014-05-23 – 2014-05-29 (×11): 25 mg via ORAL
  Filled 2014-05-23 (×15): qty 1

## 2014-05-23 MED ORDER — HYDROMORPHONE HCL PF 1 MG/ML IJ SOLN: 0.2500 mg | INTRAMUSCULAR | Status: DC | PRN

## 2014-05-23 MED ORDER — CARVEDILOL 12.5 MG PO TABS
25.0000 mg | ORAL_TABLET | Freq: Two times a day (BID) | ORAL | Status: DC
  Administered 2014-05-23 – 2014-05-25 (×3): 25 mg via ORAL
  Filled 2014-05-23 (×5): qty 2

## 2014-05-23 MED ORDER — POTASSIUM CHLORIDE CRYS ER 20 MEQ PO TBCR
20.0000 meq | EXTENDED_RELEASE_TABLET | Freq: Every day | ORAL | Status: DC
  Administered 2014-05-23: 20 meq via ORAL
  Filled 2014-05-23: qty 1

## 2014-05-23 MED ORDER — FUROSEMIDE 10 MG/ML IJ SOLN
40.0000 mg | Freq: Two times a day (BID) | INTRAMUSCULAR | Status: DC
  Administered 2014-05-23 – 2014-05-25 (×4): 40 mg via INTRAVENOUS
  Filled 2014-05-23 (×4): qty 4

## 2014-05-23 MED ORDER — ISOSORBIDE DINITRATE 20 MG PO TABS
30.0000 mg | ORAL_TABLET | Freq: Two times a day (BID) | ORAL | Status: DC
  Administered 2014-05-23 – 2014-05-29 (×9): 30 mg via ORAL
  Filled 2014-05-23 (×12): qty 2

## 2014-05-23 MED ORDER — SODIUM CHLORIDE 0.9 % IV SOLN
250.0000 mL | INTRAVENOUS | Status: DC | PRN
  Administered 2014-05-24: 250 mL via INTRAVENOUS

## 2014-05-23 MED ORDER — ACETAMINOPHEN 325 MG PO TABS
650.0000 mg | ORAL_TABLET | ORAL | Status: DC | PRN
Start: 2014-05-23 — End: 2014-05-29
  Administered 2014-05-23 – 2014-05-26 (×5): 650 mg via ORAL
  Filled 2014-05-23 (×5): qty 2

## 2014-05-23 MED ORDER — HYDROCODONE-ACETAMINOPHEN 5-325 MG PO TABS
1.0000 | ORAL_TABLET | ORAL | Status: DC | PRN
  Administered 2014-05-23 – 2014-05-26 (×3): 1 via ORAL
  Filled 2014-05-23 (×3): qty 1

## 2014-05-23 MED ORDER — GUAIFENESIN-DM 100-10 MG/5ML PO SYRP
5.0000 mL | ORAL_SOLUTION | ORAL | Status: DC | PRN
  Administered 2014-05-23 – 2014-05-27 (×5): 5 mL via ORAL
  Filled 2014-05-23 (×5): qty 5

## 2014-05-23 MED ORDER — FUROSEMIDE 10 MG/ML IJ SOLN
40.0000 mg | Freq: Once | INTRAMUSCULAR | Status: AC
  Administered 2014-05-23: 40 mg via INTRAVENOUS
  Filled 2014-05-23: qty 4

## 2014-05-23 MED ORDER — METOCLOPRAMIDE HCL 10 MG PO TABS: 10.0000 mg | ORAL_TABLET | Freq: Three times a day (TID) | ORAL | Status: DC | PRN

## 2014-05-23 MED ORDER — POTASSIUM CHLORIDE CRYS ER 20 MEQ PO TBCR: 30.0000 meq | EXTENDED_RELEASE_TABLET | Freq: Every day | ORAL | Status: DC

## 2014-05-23 MED ORDER — POTASSIUM CHLORIDE CRYS ER 20 MEQ PO TBCR
40.0000 meq | EXTENDED_RELEASE_TABLET | Freq: Once | ORAL | Status: AC
  Administered 2014-05-23: 40 meq via ORAL
  Filled 2014-05-23: qty 2

## 2014-05-23 MED ORDER — POTASSIUM CHLORIDE CRYS ER 20 MEQ PO TBCR
20.0000 meq | EXTENDED_RELEASE_TABLET | Freq: Every day | ORAL | Status: DC
  Administered 2014-05-24 – 2014-05-29 (×6): 20 meq via ORAL
  Filled 2014-05-23 (×7): qty 1

## 2014-05-23 MED ORDER — SODIUM CHLORIDE 0.9 % IJ SOLN
3.0000 mL | INTRAMUSCULAR | Status: DC | PRN
  Administered 2014-05-23: 3 mL via INTRAVENOUS

## 2014-05-23 MED ORDER — FUROSEMIDE 10 MG/ML IJ SOLN
40.0000 mg | Freq: Every day | INTRAMUSCULAR | Status: DC
  Administered 2014-05-23: 40 mg via INTRAVENOUS
  Filled 2014-05-23: qty 4

## 2014-05-23 NOTE — Progress Notes (Signed)
F/U on pain after admin PRN Hydrocodone. Pt now rates pain 2 (0-10) & confirms that she is more relaxed at this time.

## 2014-05-23 NOTE — Progress Notes (Signed)
  Echocardiogram 2D Echocardiogram has been performed.  Holmes, Chilo 05/23/2014, 11:49 AM

## 2014-05-23 NOTE — ED Notes (Signed)
Pt placed on 2l [place] per EDP request. Pt's sats 100% on RA.

## 2014-05-23 NOTE — H&P (Signed)
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  Pt has been having some problems with affording her medications.  She was on disability thru her employer [employer] [facility]) and this somehow got messed up so now she is waiting for her long term disability to kick in and she is suppose to get her first check tomorrow.  Denies fever.  Has dry cough.  No swelling in legs.  With orthopnea and pnd for over a week.   For last four months she is not taking her imdur and hydralazine and demedex but then over a week ago she started taking her demadex at double the dose.  Adheres to a salt restricted diet very well.  Review of Systems:  Positive and negative as per HPI otherwise all other systems are negative  Past Medical History: Past Medical History  Diagnosis Date  . 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 HTN (hypertension)   . VT (ventricular tachycardia)     nonsustained  . Hypokalemia   . Obesity   . Pericardial effusion   . Diastolic dysfunction     grade 1  . PONV (postoperative nausea and vomiting)   . Hydronephrosis with renal and ureteral calculus obstruction 09/05/2013  . CHF (congestive heart failure)    Past Surgical History  Procedure Laterality Date  . Tonsillectomy    . Breast lumpectomy  1989    L breast- benign  . Tubal ligation    . Cholecystectomy    . Cardiac defibrillator placement  5/12    SJM by JA  . Colonoscopy  07/02/2012    Procedure: COLONOSCOPY;  Surgeon: [name], MD;  Location: [facility];  Service: Endoscopy;  Laterality: N/A;  1:15/PATIENT HAS A DEFIBRILLATOR  . Cystoscopy w/ ureteral stent placement Right 09/06/2013    Procedure: CYSTOSCOPY WITH RIGHT RETROGRADE PYELOGRAM; RIGHT URETERAL STENT PLACEMENT;  Surgeon: [name], MD;  Location: [facility];  Service:  Urology;  Laterality: Right;    Medications: Prior to Admission medications   Medication Sig Start Date End Date Taking? Authorizing Provider  aspirin EC 81 MG tablet Take 81 mg by mouth daily.   Yes Historical Provider, MD  DM-GG-Chlorphen-Acetaminophen (CORICIDIN HBP DAY/NIGHT COLD PO) Take by mouth.   Yes Historical Provider, MD  hydrALAZINE (APRESOLINE) 25 MG tablet Take 1 tablet (25 mg total) by mouth 3 (three) times daily. 10/31/13  Yes Thurnell Lose, MD  potassium chloride SA (K-DUR,KLOR-CON) 20 MEQ tablet Take 20 mEq by mouth daily.   Yes Historical Provider, MD  torsemide (DEMADEX) 20 MG tablet Take 20 mg by mouth daily.   Yes Historical Provider, MD  carvedilol (COREG) 25 MG tablet Take 25 mg by mouth 2 (two) times daily with a meal.    Historical Provider, MD  HYDROcodone-acetaminophen (NORCO/VICODIN) 5-325 MG per tablet Take 1 tablet by mouth every 6 (six) hours as needed. 02/11/14   Carmin Muskrat, MD  isosorbide dinitrate (ISORDIL) 30 MG tablet Take 30 mg by mouth 2 (two) times daily.    Historical Provider, MD  metoCLOPramide (REGLAN) 10 MG tablet Take 1 tablet (10 mg total) by mouth every 8 (eight) hours as needed for nausea. 02/11/14   Carmin Muskrat, MD    Allergies:   Allergies  Allergen Reactions  . Peanut-Containing Drug Products Anaphylaxis  . Ace Inhibitors Swelling  . Morphine Nausea And Vomiting  . Demerol Rash    Social History:  reports that she has never smoked. She has never used smokeless tobacco. She reports that she does not drink alcohol or use illicit drugs.  Family History: Family History  Problem Relation Age of Onset  . Hypertension    . Diabetes    . Colon cancer Neg Hx     Physical Exam: Filed Vitals:   05/23/14 0115 05/23/14 0122 05/23/14 0130 05/23/14 0233  BP:  138/108 138/108 136/94  Pulse: 100 104  103  Temp:    97.5 F (36.4 C)  TempSrc:    Oral  Resp: 24 27 26 20   Height:      Weight:      SpO2: 100% 100%  100%    General appearance: alert, cooperative and mild distress Head: Normocephalic, without obvious abnormality, atraumatic Nose: Nares normal. Septum midline. Mucosa normal. No drainage or sinus tenderness. Neck: JVD - 6 cm above sternal notch and supple, symmetrical, trachea midline Lungs: clear to auscultation bilaterally Heart: regular rate and rhythm, S1, S2 normal, no murmur, click, rub or gallop Abdomen: soft, non-tender; bowel sounds normal; no masses,  no organomegaly Extremities: extremities normal, atraumatic, no cyanosis or edema Pulses: 2+ and symmetric Skin: Skin color, texture, turgor normal. No rashes or lesions Neurologic: Grossly normal    Labs on Admission:   Recent Labs  05/22/14 2340  NA 136*  K 3.6*  CL 95*  CO2 23  GLUCOSE 131*  BUN 35*  CREATININE 1.84*  CALCIUM 9.3    Recent Labs  05/22/14 2340  WBC 8.5  NEUTROABS 3.7  HGB 14.8  HCT 42.9  MCV 93.1  PLT 248    Recent Labs  05/22/14 2340  TROPONINI <0.30   Radiological Exams on Admission: Dg Chest 2 View  05/23/2014   CLINICAL DATA:  Cough and shortness of breath for 4 weeks. History of cardiomyopathy.  EXAM: CHEST  2 VIEW  COMPARISON:  10/28/2013  FINDINGS: Cardiac pacemaker. Diffuse cardiac enlargement without vascular congestion, similar to prior study. No focal airspace disease or consolidation in the lungs. No blunting of costophrenic angles. No pneumothorax.  IMPRESSION: Cardiac enlargement.  No evidence of active pulmonary disease.   Electronically Signed   By: Lucienne Capers M.D.   On: 05/23/2014 01:08    Assessment/Plan  53 yo female with acute on chronic systolic chf exacerbation  Principal Problem:   CHF exacerbation-  Lasix 40 mg iv daily.  chf pathway.  Echo in am (last one in sept 14 was 10-20%, not much different than initial in 2012).  Hopefully she will get her disability check tomorrow.  Also she is paying more than $4 a month for her generics at the local pharmacy.   Advised to check out [facility] for better prices.  Active Problems:  Stable unless o/w noted   HYPERTENSION-  Restart hydralazine and imdur cont coreg   Acute on chronic systolic heart failure   Acute on chronic renal failure   Noncompliance with medication regimen   SOB (shortness of breath)  Admit to tele.  Full code.  DAVID,RACHAL A 05/23/2014, 2:36 AM

## 2014-05-23 NOTE — Care Management Utilization Note (Signed)
UR completed 

## 2014-05-23 NOTE — Progress Notes (Signed)
The patient is a 53 year old woman with nonischemic cardiomyopathy and hypertension, who was admitted this morning by Dr. Shanon Brow for shortness of breath. She was briefly seen and examined. Her chart, vital signs, laboratory studies were reviewed. Her symptomatology is consistent with acute on chronic systolic heart failure. She had been having difficulty requiring her cardiac medications because of cost. Apparently, she will be receiving her disability check soon. We'll continue management as outlined by Dr. Shanon Brow. We'll add when necessary hydromorphone or Vicodin as needed for back pain. Will increase Lasix to 40 mg IV every 12 hours. We'll reassess her thyroid function with a TSH.

## 2014-05-23 NOTE — ED Notes (Signed)
Called for report, nurse unavailable, will call back.

## 2014-05-23 NOTE — Progress Notes (Signed)
Automatic BP obtained 82/47. Anita Warren recheck BP 90/60. Pt asymptomatic at this time. Pt refused 2 PO BP meds this shift d/t low BP. MD notified.

## 2014-05-23 NOTE — Progress Notes (Signed)
Pt BP running low, 94/68. MD notified.

## 2014-05-23 NOTE — Care Management Note (Addendum)
    Page 1 of 1   05/29/2014     3:04:15 PM CARE MANAGEMENT NOTE 05/29/2014  Patient:  Anita Warren, Anita Warren   Account Number:  [id number]  Date Initiated:  05/23/2014  Documentation initiated by:  Vladimir Creeks  Subjective/Objective Assessment:   Admitted with CHF exacerbation, dyspnea. pt is from home with spouse and one son, who is still at home. She does not have O2 at home yet. She is still fairly independent at home, but also has 2 sisters who will help out as needed     Action/Plan:   Pt may need help with medications- has been out of most meds for 2-3 months due to mix-up with her disability insurance at work. They have finally gotten this fixed and she is due a check today, but has no ins. still   Anticipated DC Date:  05/25/2014   Anticipated DC Plan:  [place]  CM consult  [place] [facility]      Choice offered to / List presented to:             Status of service:  Completed, signed off Medicare Important Message given?   (If response is "NO", the following Medicare IM given date fields will be blank) Date Medicare IM given:   Medicare IM given by:   Date Additional Medicare IM given:   Additional Medicare IM given by:    Discharge Disposition:  HOME/SELF CARE  Per UR Regulation:  Reviewed for med. necessity/level of care/duration of stay  If discussed at [place] of Stay Meetings, dates discussed:    Comments:  05/29/14 [place], RN BSN CM Pt discharged home today. [place] voucher given for medication assistance. No furthur CM needs noted.  05/26/2014 [place], RN, MSN, PCCN Pt started on IV lasix, MD anticipated hospitalization over the weekend. Patient assessed for home o2 needs but does not meet qualifications. CM will continue to follow.  05/23/14 1100 Vladimir Creeks RN/CM

## 2014-05-24 DIAGNOSIS — I429 Cardiomyopathy, unspecified: Secondary | ICD-10-CM

## 2014-05-24 DIAGNOSIS — N289 Disorder of kidney and ureter, unspecified: Secondary | ICD-10-CM

## 2014-05-24 LAB — CBC
HEMATOCRIT: 40.1 % (ref 36.0–46.0)
HEMOGLOBIN: 13.5 g/dL (ref 12.0–15.0)
MCH: 31.8 pg (ref 26.0–34.0)
MCHC: 33.7 g/dL (ref 30.0–36.0)
MCV: 94.6 fL (ref 78.0–100.0)
Platelets: 208 10*3/uL (ref 150–400)
RBC: 4.24 MIL/uL (ref 3.87–5.11)
RDW: 15.3 % (ref 11.5–15.5)
WBC: 6.9 10*3/uL (ref 4.0–10.5)

## 2014-05-24 LAB — BASIC METABOLIC PANEL
ANION GAP: 11 (ref 5–15)
BUN: 33 mg/dL — AB (ref 6–23)
CHLORIDE: 95 meq/L — AB (ref 96–112)
CO2: 29 mEq/L (ref 19–32)
CREATININE: 1.82 mg/dL — AB (ref 0.50–1.10)
Calcium: 9.1 mg/dL (ref 8.4–10.5)
GFR calc non Af Amer: 31 mL/min — ABNORMAL LOW (ref >90)
GFR, EST AFRICAN AMERICAN: 36 mL/min — AB (ref >90)
Glucose, Bld: 95 mg/dL (ref 70–99)
Potassium: 4 mEq/L (ref 3.7–5.3)
Sodium: 135 mEq/L — ABNORMAL LOW (ref 137–147)

## 2014-05-24 MED ORDER — DOCUSATE SODIUM 100 MG PO CAPS
200.0000 mg | ORAL_CAPSULE | Freq: Two times a day (BID) | ORAL | Status: DC
  Administered 2014-05-24 – 2014-05-29 (×10): 200 mg via ORAL
  Filled 2014-05-24 (×11): qty 2

## 2014-05-24 NOTE — Progress Notes (Signed)
Pt c/o dryness in nares. Nurse hooked up humidification to oxygen. Will reevaluate if relief was achieved. Pt also c/o pain on right side after attempting to have BM. Tylenol given. Pt requests stool softener to help pt have BM. Notified MD. Awaiting orders from physician. Pt now resting in bed with O2 at 2L. Visitor at bedside. Will continue to monitor.

## 2014-05-24 NOTE — Clinical Documentation Improvement (Signed)
Please specify stage if possible clinical conditions?   _______CKD Stage I - GFR > OR = 90 _______CKD Stage II - GFR 60-80 _______CKD Stage III - GFR 30-59 _______CKD Stage IV - GFR 15-29 _______CKD Stage V - GFR < 15 _______ESRD (End Stage Renal Disease) _______Other condition_____________ _______Cannot Clinically determine   Supporting Information: Component     Latest Ref Rng 05/22/2014 05/23/2014 05/24/2014  BUN     6 - 23 mg/dL 35 (H) 34 (H) 33 (H)  Creatinine     0.50 - 1.10 mg/dL 1.84 (H) 1.68 (H) 1.82 (H)  GFR calc non Af Amer     >90 mL/min 30 (L) 34 (L) 31 (L)  GFR calc Af Amer     >90 mL/min 35 (L) 39 (L) 36 (L)   Risk Factors: medication noncompliance, HTN  Signs & Symptoms: ED note: BP 134/110 Acute on chronic renal insufficiency  Hypokalemia, hyponatremia  Treatment: Lasix 40 mg IV every 12 hours  Monitor her renal function  Restart hydralazine and imdur cont coreg  Thank You   Philippa Chester ,RN Clinical Documentation Specialist:  [place] Information Management

## 2014-05-24 NOTE — Progress Notes (Signed)
Notified hospitalist regarding BP medication administration. BP running low all throughout day. Hospitalist said to hold BP medications tonight. Will continue to monitor pt frequently throughout night. Pt laying in bed at lowest position and call bell within reach. Oxygen given through [place] at 2L/min. No complaints at this time.

## 2014-05-24 NOTE — Progress Notes (Signed)
[facility] PROGRESS NOTE  Anita Warren OEV:[id number] DOB: [birth year] DOA: 05/22/2014 PCP: Glo Herring., MD  Assessment/Plan: 1. Acute on chronic systolic congestive heart failure. Previously, patient was found to have an ejection fraction of 10-15% since 2012. This was evaluated by cardiac catheterization in 01/2011 and was found to have nonobstructive coronary disease. 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 She's been treated medically. She status post AICD. She has worsening shortness of breath. She reports to watch what she normally does although, she does not have significant lower extremity edema. She has been started on Lasix, but her blood pressure has been marginal her creatinine has been trending up. She does not have any clear-cut evidence of volume overload on exam other than her shortness of breath. Ejection fraction was repeat echocardiogram today was noted to be further reduced to 5-10%. With her severe cardiomyopathy, we will ask cardiology to see the patient and assist with her management. I'm not sure whether she will require inotropes or further diuresis. Cardiac enzymes have thus far been negative 2. Chronic kidney disease stage III. Creatinine is trending up. Diuresis may be difficult to low cardiac output. Continue to monitor urine output  Code Status: full code Family Communication: discussed with patient, no family present Disposition Plan: discharge home once improved   Consultants:    Procedures: Echo: - Severely dilated left ventricle with upper normal wall thickness and LVEF approximately 5-10%. There is diffuse hypokinesis consistent with a nonischemic cardiomyopathy and evidence of low cardiac output. Grade 4 diastolic dysfunction noted. Severe biatrial enlargement. Mild mitral regurgitation. Moderate to severe RV enlargement with device wire present and moderately reduced contraction. Moderate tricuspid regurgitation with  PASP 38 mm mercury. Small pericardial effusion.     Antibiotics:    HPI/Subjective: Still feels short of breath and coughing. Denies any chest pain  Objective: Filed Vitals:   05/24/14 1735  BP: 97/79  Pulse: 94  Temp:   Resp:     Intake/Output Summary (Last 24 hours) at 05/24/14 1942 Last data filed at 05/24/14 1858  Gross per 24 hour  Intake   1203 ml  Output   1300 ml  Net    -97 ml   Filed Weights   05/22/14 2313 05/23/14 0609 05/24/14 0531  Weight: 79.181 kg (174 lb 9 oz) 79.379 kg (175 lb) 80.377 kg (177 lb 3.2 oz)    Exam:   General:  NAD, short of breath while talking  Cardiovascular: S1, s2 RRR  Respiratory: CTA B  Abdomen: soft, nt,nd, bs+  Musculoskeletal: no edema b/l   Data Reviewed: Basic Metabolic Panel:  Recent Labs Lab 05/22/14 2340 05/23/14 0912 05/24/14 0543  NA 136* 137 135*  K 3.6* 4.5 4.0  CL 95* 97 95*  CO2 23 26 29   GLUCOSE 131* 135* 95  BUN 35* 34* 33*  CREATININE 1.84* 1.68* 1.82*  CALCIUM 9.3 9.1 9.1   Liver Function Tests: No results found for this basename: AST, ALT, ALKPHOS, BILITOT, PROT, ALBUMIN,  in the last 168 hours No results found for this basename: LIPASE, AMYLASE,  in the last 168 hours No results found for this basename: AMMONIA,  in the last 168 hours CBC:  Recent Labs Lab 05/22/14 2340 05/24/14 0543  WBC 8.5 6.9  NEUTROABS 3.7  --   HGB 14.8 13.5  HCT 42.9 40.1  MCV 93.1 94.6  PLT 248 208   Cardiac Enzymes:  Recent Labs Lab 05/22/14 2340 05/23/14 0343 05/23/14 0912 05/23/14 1534  TROPONINI <  0.30 <0.30 <0.30 <0.30   BNP (last 3 results)  Recent Labs  10/28/13 1612 10/29/13 0537 05/22/14 2340  PROBNP 15861.0* 8975.0* 13927.0*   CBG: No results found for this basename: GLUCAP,  in the last 168 hours  No results found for this or any previous visit (from the past 240 hour(s)).   Studies: Dg Chest 2 View  05/23/2014   CLINICAL DATA:  Cough and shortness of breath for 4  weeks. History of cardiomyopathy.  EXAM: CHEST  2 VIEW  COMPARISON:  10/28/2013  FINDINGS: Cardiac pacemaker. Diffuse cardiac enlargement without vascular congestion, similar to prior study. No focal airspace disease or consolidation in the lungs. No blunting of costophrenic angles. No pneumothorax.  IMPRESSION: Cardiac enlargement.  No evidence of active pulmonary disease.   Electronically Signed   By: Lucienne Capers M.D.   On: 05/23/2014 01:08    Scheduled Meds: . aspirin EC  81 mg Oral Daily  . carvedilol  25 mg Oral BID WC  . enoxaparin (LOVENOX) injection  40 mg Subcutaneous Q24H  . furosemide  40 mg Intravenous Q12H  . hydrALAZINE  25 mg Oral TID  . isosorbide dinitrate  30 mg Oral BID  . potassium chloride SA  20 mEq Oral Daily  . sodium chloride  3 mL Intravenous Q12H   Continuous Infusions:   Principal Problem:   CHF exacerbation Active Problems:   HYPERTENSION   Acute on chronic systolic heart failure   Acute on chronic renal failure   Noncompliance with medication regimen   SOB (shortness of breath)    Time spent: 68mins    MEMON,JEHANZEB  [facility] Pager [phone]. If 7PM-7AM, please contact night-coverage at [email or web address], password [facility] 05/24/2014, 7:42 PM  LOS: 2 days

## 2014-05-24 NOTE — Progress Notes (Signed)
Pharmacist Heart Failure Core Measure Documentation  Assessment: Anita Warren has an EF documented as 5-10% on 05/23/14 by ECHO.  Rationale: Heart failure patients with left ventricular systolic dysfunction (LVSD) and an EF < 40% should be prescribed an angiotensin converting enzyme inhibitor (ACEI) or angiotensin receptor blocker (ARB) at discharge unless a contraindication is documented in the medical record.  This patient is not currently on an ACEI or ARB for HF.  This note is being placed in the record in order to provide documentation that a contraindication to the use of these agents is present for this encounter.  ACE Inhibitor or Angiotensin Receptor Blocker is contraindicated (specify all that apply)  []   ACEI allergy AND ARB allergy []   Angioedema []   Moderate or severe aortic stenosis []   Hyperkalemia [x]   Hypotension []   Renal artery stenosis [x]   Worsening renal function, preexisting renal disease or dysfunction   Biagio Borg 05/24/2014 12:03 PM

## 2014-05-25 ENCOUNTER — Encounter (HOSPITAL_COMMUNITY): Payer: Self-pay | Admitting: Cardiology

## 2014-05-25 LAB — BASIC METABOLIC PANEL
Anion gap: 12 (ref 5–15)
BUN: 34 mg/dL — AB (ref 6–23)
CALCIUM: 9 mg/dL (ref 8.4–10.5)
CO2: 26 mEq/L (ref 19–32)
Chloride: 98 mEq/L (ref 96–112)
Creatinine, Ser: 1.55 mg/dL — ABNORMAL HIGH (ref 0.50–1.10)
GFR calc non Af Amer: 37 mL/min — ABNORMAL LOW (ref >90)
GFR, EST AFRICAN AMERICAN: 43 mL/min — AB (ref >90)
GLUCOSE: 122 mg/dL — AB (ref 70–99)
Potassium: 4.3 mEq/L (ref 3.7–5.3)
Sodium: 136 mEq/L — ABNORMAL LOW (ref 137–147)

## 2014-05-25 MED ORDER — SPIRONOLACTONE 25 MG PO TABS
12.5000 mg | ORAL_TABLET | Freq: Every day | ORAL | Status: DC
  Administered 2014-05-25 – 2014-05-29 (×5): 12.5 mg via ORAL
  Filled 2014-05-25 (×5): qty 1

## 2014-05-25 MED ORDER — FUROSEMIDE 40 MG PO TABS
40.0000 mg | ORAL_TABLET | Freq: Every day | ORAL | Status: DC
  Administered 2014-05-25: 40 mg via ORAL
  Filled 2014-05-25: qty 1

## 2014-05-25 MED ORDER — CYCLOBENZAPRINE HCL 10 MG PO TABS
10.0000 mg | ORAL_TABLET | Freq: Three times a day (TID) | ORAL | Status: DC | PRN
  Administered 2014-05-25 – 2014-05-26 (×3): 10 mg via ORAL
  Filled 2014-05-25 (×3): qty 1

## 2014-05-25 NOTE — Progress Notes (Signed)
[facility] PROGRESS NOTE  SAKOYA WIN PRF:[id number] DOB: 17-Sep-1961 DOA: 05/22/2014 PCP: Glo Herring., MD  Assessment/Plan: 1. Acute on chronic systolic congestive heart failure. Previously, patient was found to have an ejection fraction of 10-15% since 2012. This was evaluated by cardiac catheterization in 01/2011 and was found to have nonobstructive coronary disease. 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 She's been treated medically. She status post AICD. She had worsening shortness of breath. Cardiac enzymes thus far have been negative. She's been seen by cardiology. Appreciate their assistance. Clinically, she appear to be improving today. She is continued on oral Lasix and Aldactone has been added to her regimen. Echocardiogram has revealed an ejection fraction of 5-10% with grade 4 diastolic dysfunction. She's been set up to be seen in the advanced heart failure clinic. We'll continue to monitor the patient. 2. Chronic kidney disease stage III. creatinine is mildly improved since yesterday. Continue diuretics.  Code Status: full code Family Communication: discussed with patient, no family present Disposition Plan: discharge home once improved   Consultants:  Cardiology  Procedures: Echo: - Severely dilated left ventricle with upper normal wall thickness and LVEF approximately 5-10%. There is diffuse hypokinesis consistent with a nonischemic cardiomyopathy and evidence of low cardiac output. Grade 4 diastolic dysfunction noted. Severe biatrial enlargement. Mild mitral regurgitation. Moderate to severe RV enlargement with device wire present and moderately reduced contraction. Moderate tricuspid regurgitation with PASP 38 mm mercury. Small pericardial effusion.     Antibiotics:    HPI/Subjective: Patient feels generally weak, lightheaded, no chest pain.  Objective: Filed Vitals:   05/25/14 1700  BP: 101/68  Pulse: 78  Temp:   Resp:  18    Intake/Output Summary (Last 24 hours) at 05/25/14 1954 Last data filed at 05/25/14 1300  Gross per 24 hour  Intake    240 ml  Output   2000 ml  Net  -1760 ml   Filed Weights   05/23/14 0609 05/24/14 0531 05/25/14 0844  Weight: 79.379 kg (175 lb) 80.377 kg (177 lb 3.2 oz) 78.427 kg (172 lb 14.4 oz)    Exam:   General:  NAD, short of breath while talking  Cardiovascular: S1, s2 RRR  Respiratory: CTA B  Abdomen: soft, nt,nd, bs+  Musculoskeletal: no edema b/l   Data Reviewed: Basic Metabolic Panel:  Recent Labs Lab 05/22/14 2340 05/23/14 0912 05/24/14 0543 05/25/14 0529  NA 136* 137 135* 136*  K 3.6* 4.5 4.0 4.3  CL 95* 97 95* 98  CO2 23 26 29 26   GLUCOSE 131* 135* 95 122*  BUN 35* 34* 33* 34*  CREATININE 1.84* 1.68* 1.82* 1.55*  CALCIUM 9.3 9.1 9.1 9.0   Liver Function Tests: No results found for this basename: AST, ALT, ALKPHOS, BILITOT, PROT, ALBUMIN,  in the last 168 hours No results found for this basename: LIPASE, AMYLASE,  in the last 168 hours No results found for this basename: AMMONIA,  in the last 168 hours CBC:  Recent Labs Lab 05/22/14 2340 05/24/14 0543  WBC 8.5 6.9  NEUTROABS 3.7  --   HGB 14.8 13.5  HCT 42.9 40.1  MCV 93.1 94.6  PLT 248 208   Cardiac Enzymes:  Recent Labs Lab 05/22/14 2340 05/23/14 0343 05/23/14 0912 05/23/14 1534  TROPONINI <0.30 <0.30 <0.30 <0.30   BNP (last 3 results)  Recent Labs  10/28/13 1612 10/29/13 0537 05/22/14 2340  PROBNP 15861.0* 8975.0* 13927.0*   CBG: No results found for this basename: GLUCAP,  in the  last 168 hours  No results found for this or any previous visit (from the past 240 hour(s)).   Studies: No results found.  Scheduled Meds: . aspirin EC  81 mg Oral Daily  . carvedilol  25 mg Oral BID WC  . docusate sodium  200 mg Oral BID  . enoxaparin (LOVENOX) injection  40 mg Subcutaneous Q24H  . furosemide  40 mg Oral Daily  . hydrALAZINE  25 mg Oral TID  . isosorbide  dinitrate  30 mg Oral BID  . potassium chloride SA  20 mEq Oral Daily  . sodium chloride  3 mL Intravenous Q12H  . spironolactone  12.5 mg Oral Daily   Continuous Infusions:   Principal Problem:   CHF exacerbation Active Problems:   HYPERTENSION   Acute on chronic systolic heart failure   Acute on chronic renal failure   Noncompliance with medication regimen   SOB (shortness of breath)    Time spent: 74mins    MEMON,JEHANZEB  [facility] Pager [phone]. If 7PM-7AM, please contact night-coverage at [email or web address], password [facility] 05/25/2014, 7:54 PM  LOS: 3 days

## 2014-05-25 NOTE — Consult Note (Signed)
CARDIOLOGY CONSULT NOTE   Patient ID: Anita Warren MRN: [id number] DOB/AGE: 53-Dec-1962 53 y.o.  Admit Date: 05/22/2014 Referring Physician: PTH Primary Physician: Glo Herring., MD Consulting Cardiologist: Rozann Lesches MD Primary Cardiologist: Carlyle Dolly MD [facility]) Reason for Consultation: Severe LV dysfunction with CHF  Clinical Summary Anita Warren is a 53 y.o.female with history of viral, non-ischemic cardiomyopathy, severe systolic dysfunction with EF of 10% s/p ICD pacemaker in the setting of VT 02/2011 (Beltrami VR model CD1231-40Q (serial [id number]); admitted with acute on chronic systolic CHF in the setting of medical non-compliance, not taking medications for 4 months, as she could not afford medications. She had been out of hydralazine, Imdur, and Coreg. She states she been taking Demadex as directed,. She had been waiting on disability check. She was last seen by Dr. Harl Bowie in our office in March of 2014, hemodynamically stable. Last recorded wt in January 2015 was 194 lbs.  The patient states symptoms began approximately 4 weeks ago where she initially was complaining of a cough. The patient states she increased her dose of Demadex 20 mg, from once a day to 2 tablets daily, and cough got better. She returned to the daily dose and cough became more apparent, with associated shortness of breath. She repeated this process over and over over the last 4 weeks with waxing and waning symptoms. However, on day prior to admission she was having significant PND and orthopnea, along with shortness of breath lying on her left side. Extra doses of Demadex did not improve symptoms, and therefore she presented to the emergency room. She denies ICD discharge. She did feel her heart racing on occasion especially when she cannot breathe. She denied edema,, position, or early siaiety.  On arrival to ER, found to be hypertensive with BP 134/110, HR 114 bpm O2 Sat  100%. Labs revealed mild hyponatremia, 136, K+ 3.6, Creatine 1.84. Pro-BNP 13,927, CXR cardiac enlargement without evidence of CHF. She was initially treated with IV lasix and potassium supplement. Wt recorded on admission 174 lbs. the patient states she is breathing better, has no further complaints of PND orthopnea or cough. She is diuresed approximately 3 L since admission.   Allergies  Allergen Reactions  . Peanut-Containing Drug Products Anaphylaxis  . Ace Inhibitors Swelling  . Morphine Nausea And Vomiting  . Demerol Rash    Medications Scheduled Medications: . aspirin EC  81 mg Oral Daily  . carvedilol  25 mg Oral BID WC  . docusate sodium  200 mg Oral BID  . enoxaparin (LOVENOX) injection  40 mg Subcutaneous Q24H  . furosemide  40 mg Intravenous Q12H  . hydrALAZINE  25 mg Oral TID  . isosorbide dinitrate  30 mg Oral BID  . potassium chloride SA  20 mEq Oral Daily  . sodium chloride  3 mL Intravenous Q12H  . spironolactone  12.5 mg Oral Daily   PRN Medications: sodium chloride, acetaminophen, guaiFENesin-dextromethorphan, HYDROcodone-acetaminophen, HYDROmorphone (DILAUDID) injection, metoCLOPramide, ondansetron (ZOFRAN) IV, ondansetron (ZOFRAN) IV, sodium chloride   Past Medical History  Diagnosis Date  . Nonischemic cardiomyopathy     LVEF 5-10%, likely viral (no CAD by cath 01/30/11)  . Essential hypertension, benign   . NSVT (nonsustained ventricular tachycardia)   . Obesity   . Hydronephrosis with renal and ureteral calculus obstruction 09/05/2013  . Chronic systolic heart failure   . History of medication noncompliance     Past Surgical History  Procedure Laterality Date  . Tonsillectomy    .  Breast lumpectomy  1989    L breast- benign  . Tubal ligation    . Cholecystectomy    . Cardiac defibrillator placement  5/12    SJM by JA  . Colonoscopy  07/02/2012    Procedure: COLONOSCOPY;  Surgeon: [name], MD;  Location: [facility];  Service: Endoscopy;   Laterality: N/A;  1:15/PATIENT HAS A DEFIBRILLATOR  . Cystoscopy w/ ureteral stent placement Right 09/06/2013    Procedure: CYSTOSCOPY WITH RIGHT RETROGRADE PYELOGRAM; RIGHT URETERAL STENT PLACEMENT;  Surgeon: [name], MD;  Location: [facility] ORS;  Service: Urology;  Laterality: Right;    Family History  Problem Relation Age of Onset  . Hypertension    . Diabetes    . Colon cancer Neg Hx     Social History Anita Warren reports that she has never smoked. She has never used smokeless tobacco. Anita Warren reports that she does not drink alcohol.   Review of Systems Otherwise reviewed and negative except as outlined.  Physical Examination Blood pressure 107/75, pulse 71, temperature 97.4 F (36.3 C), temperature source Oral, resp. rate 20, height 5\' 4"  (1.626 m), weight 172 lb 14.4 oz (78.427 kg), last menstrual period 10/28/1999, SpO2 100.00%.  Intake/Output Summary (Last 24 hours) at 05/25/14 1039 Last data filed at 05/25/14 0800  Gross per 24 hour  Intake    720 ml  Output   2900 ml  Net  -2180 ml    Telemetry:  GEN: Resting comfortably in bed. HEENT: Conjunctiva and lids normal, oropharynx clear with moist mucosa. Neck: Supple, no elevated JVP or carotid bruits, no thyromegaly. Lungs: Clear to auscultation, nonlabored breathing at rest. Cardiac: Regular rate and rhythm, no S3 or significant systolic murmur, no pericardial rub. Abdomen: Soft, nontender, no hepatomegaly, bowel sounds present, no guarding or rebound. Extremities: No pitting edema, distal pulses 2+. Skin: Warm and dry. Musculoskeletal: No kyphosis. Neuropsychiatric: Alert and oriented x3, affect grossly appropriate.  Prior Cardiac Testing/Procedures 1. Echcocardiogram: 04/2014 Left ventricle: The cavity size was severely dilated. Wall thickness was at the upper limits of normal. Systolic function was severely reduced. The estimated ejection fraction was in the range of 5% to 10%. Consistent with  low output heart failure, calculated cardiac output 2.3 L per minute. Diffuse hypokinesis. Doppler parameters are consistent with an irreversible restrictive pattern, indicative of decreased left ventricular diastolic compliance and/or increased left atrial pressure (grade 4 diastolic dysfunction). - Aortic valve: Mildly calcified annulus. Trileaflet. There was no significant regurgitation. - Mitral valve: There was mild regurgitation. - Left atrium: The atrium was severely dilated. - Right ventricle: The cavity size was moderately to severely dilated. Pacer wire or catheter noted in right ventricle. Systolic function was moderately reduced. - Right atrium: The atrium was severely dilated. Central venous pressure (est): 8 mm Hg. - Tricuspid valve: There was moderate regurgitation. - Pulmonary arteries: PA peak pressure: 38 mm Hg (S). - Pericardium, extracardiac: A small pericardial effusion was identified circumferential to the heart.  - Severely dilated left ventricle with upper normal wall thickness and LVEF approximately 5-10%. There is diffuse hypokinesis consistent with a nonischemic cardiomyopathy and evidence of low cardiac output. Grade 4 diastolic dysfunction noted. Severe biatrial enlargement. Mild mitral regurgitation. Moderate to severe RV enlargement with device wire present and moderately reduced contraction. Moderate tricuspid regurgitation with PASP 38 mm mercury. Small pericardial effusion.  ICD Interrogation: 07/10/2014 ICD check in clinic. Normal device function. Thresholds and sensing consistent with previous device measurements. Impedance trends stable over  time. No evidence of any ventricular arrhythmias. Histogram distribution appropriate for patient and level of activity. No changes made this session. Device programmed at appropriate safety margins. Device programmed to optimize intrinsic conduction. Corvue increase 06-18-13 x 16 days. Per Dr Rayann Heman started  Furosemide. Estimated longevity 7.1 to 7.5 years. Pt enrolled in remote follow-up. Alert tones/vibration demonstrated for patient and pt aware to call office if felt. Merlin 10-03-13 and ROV in 12 mths w/JA.  Cardiac Cath 2012 FINDINGS:  1. Hemodynamics: LV 102/16. Aorta 100/66.  2. Renal artery evaluation: Abdominal aortogram was done showing  patent renal arteries bilaterally with no indication for renal  artery stenosis.  3. Left ventriculography: EF was estimated to be 30%. Hypokinesis  was worse towards the base than at the apex.  4. Coronary angiography: The coronary system was right dominant.  There was no angiographic coronary artery disease.  IMPRESSION: This is a 53 year old who is found to have a nonischemic  cardiomyopathy, ejection fraction is about 30%. She has hypertension,  but renal artery stenosis was not found. [name])  Lab Results  Basic Metabolic Panel:  Recent Labs Lab 05/22/14 2340 05/23/14 0912 05/24/14 0543 05/25/14 0529  NA 136* 137 135* 136*  K 3.6* 4.5 4.0 4.3  CL 95* 97 95* 98  CO2 23 26 29 26   GLUCOSE 131* 135* 95 122*  BUN 35* 34* 33* 34*  CREATININE 1.84* 1.68* 1.82* 1.55*  CALCIUM 9.3 9.1 9.1 9.0     CBC:  Recent Labs Lab 05/22/14 2340 05/24/14 0543  WBC 8.5 6.9  NEUTROABS 3.7  --   HGB 14.8 13.5  HCT 42.9 40.1  MCV 93.1 94.6  PLT 248 208    Cardiac Enzymes:  Recent Labs Lab 05/22/14 2340 05/23/14 0343 05/23/14 0912 05/23/14 1534  TROPONINI <0.30 <0.30 <0.30 <0.30    Pro-BNP: 13,927   Radiology: FINDINGS: CXR Cardiac pacemaker. Diffuse cardiac enlargement without vascular  congestion, similar to prior study. No focal airspace disease or  consolidation in the lungs. No blunting of costophrenic angles. No  pneumothorax.  IMPRESSION:  Cardiac enlargement. No evidence of active pulmonary disease.   ECG: Sinus tachycardia with PACs.   Impression and Recommendations  1. Acute on Chronic Systolic CHF in the  setting of medication noncompliance: Has not been taking beta blocker, hydralazine, or Imdur for months, but has been taking potassium and Demadex daily with increasing doses on an off for the last 4 weeks for symptomatic relief. The patient denied weight gain, lower extremity edema over, distention, main complaint was cough and worsening shortness of breath.  She has diuresed approximately 3 L since admission with IV Lasix 40 mg Q 12. Creatinine has improved from admission from 1.84 to 1.55. Potassium status has improved with potassium replacement. Sodium is low normal at 136. Troponins are negative x4.  The patient would likely benefit from consultation with the [facility] at [place] discussed this with the patient who is willing to be referred to the CHF clinic. Timing at the discretion of Dr. Domenic Polite.  Agree with restarting carvedilol, hydralazine, and Imdur which is already onboard per PTH. She is symptomatically better. For continued IV diuretics, as she continues to diurese. Weight is substantially lower than discharge in January 2014., and dry weight is uncertain at this time.  Consider adding spironolactone, watching renal status closely, dose of 12.5 mg daily, in the setting of worsening CHF symptoms. She is not a candidate for ACE inhibitor as she has  had history of angioedema on this medication and therefore will not be started.  2. Hx of Viral Cardiomyopathy: Most recent echocardiogram shows LVEF of 5-10%, she did have a cardiac catheterization in 2012 with no appreciable CAD. She is continued medical management. As stated above referral to CHF clinic is recommended, and will be arranged prior to discharge. Uncertain if she is a candidate for heart transplant.  3. Medical Non-compliannce: Patient recently has been receiving disability checks, and is now able to afford her medications. She has stopped taking carvedilol, into her, and hydralazine. Medical compliance is  stressed.  4. Hypertension: She is on maximum dose of carvedilol 25 mg twice a day, remains on hydralazine 25 mg 3 times a day, and isosorbide dinitrate 30 mg twice a day. We will continue to monitor.  Signed: Phill Myron. Lawrence NP  05/25/2014, 10:39 AM Co-Sign MD   Attending note:  Patient seen and examined. Reviewed available records and modified above note by Ms. Lawrence NP. Anita Warren presents with acute on chronic systolic heart failure with low output symptoms in the setting of a nonischemic cardiomyopathy with LVEF 5-10% and restrictive diastolic physiology. She has been noncompliant with medical therapy over the last 3-4 months reporting financial constraints. No pulmonary edema by presenting chest x-ray, proBNP 13927, troponin I levels negative, initial creatinine 1.8. She has been placed back on basic medical regimen as well as IV diuretic, clinically improving, creatinine has come down to 1.5 and she remains hemodynamically stable. Followup echocardiogram as noted above. She has diuresed reasonably well and weight has come down. For now would transition back to oral diuretic, we will cautiously add Aldactone. She would benefit from evaluation in the [facility] for assessment of other potential treatment options. Certainly a right heart catheterization would be a reasonable next step back on medications. We will try to accomplish this as an outpatient since she is improving, and this is her preference.  Satira Sark, M.D., F.A.C.C.

## 2014-05-26 LAB — BASIC METABOLIC PANEL
Anion gap: 15 (ref 5–15)
BUN: 30 mg/dL — ABNORMAL HIGH (ref 6–23)
CALCIUM: 8.9 mg/dL (ref 8.4–10.5)
CO2: 23 mEq/L (ref 19–32)
CREATININE: 1.33 mg/dL — AB (ref 0.50–1.10)
Chloride: 97 mEq/L (ref 96–112)
GFR, EST AFRICAN AMERICAN: 52 mL/min — AB (ref >90)
GFR, EST NON AFRICAN AMERICAN: 45 mL/min — AB (ref >90)
GLUCOSE: 124 mg/dL — AB (ref 70–99)
Potassium: 3.5 mEq/L — ABNORMAL LOW (ref 3.7–5.3)
Sodium: 135 mEq/L — ABNORMAL LOW (ref 137–147)

## 2014-05-26 MED ORDER — FUROSEMIDE 10 MG/ML IJ SOLN
40.0000 mg | Freq: Two times a day (BID) | INTRAMUSCULAR | Status: DC
  Administered 2014-05-26 – 2014-05-27 (×4): 40 mg via INTRAVENOUS
  Filled 2014-05-26 (×4): qty 4

## 2014-05-26 MED ORDER — POTASSIUM CHLORIDE CRYS ER 20 MEQ PO TBCR
40.0000 meq | EXTENDED_RELEASE_TABLET | ORAL | Status: AC
  Administered 2014-05-26 (×2): 40 meq via ORAL
  Filled 2014-05-26 (×2): qty 2

## 2014-05-26 MED ORDER — CARVEDILOL 3.125 MG PO TABS
6.2500 mg | ORAL_TABLET | Freq: Two times a day (BID) | ORAL | Status: DC
  Administered 2014-05-26 – 2014-05-29 (×7): 6.25 mg via ORAL
  Filled 2014-05-26 (×7): qty 2

## 2014-05-26 MED ORDER — CARVEDILOL 3.125 MG PO TABS: 6.2500 mg | ORAL_TABLET | Freq: Two times a day (BID) | ORAL | Status: DC

## 2014-05-26 NOTE — Progress Notes (Signed)
Nurse in room assessing pt this am. Pt says she feels better this morning than she did yesterday. Pain has also decreased. No complaints at this time. Will continue to monitor pt throughout night.

## 2014-05-26 NOTE — Progress Notes (Signed)
05/26/14 1239 Pt evaluated for home O2 needs. O2 sats 96% on r/a at rest and while bathing. O2 sats maintained in 90s on r/a during ambulation in hallway with nurse tech. O2 sats dropped to 89% on r/a on return to room, no c/o shortness of breath. O2 sats 97% on r/a at rest on return to room. Notified case management. Donavan Foil, RN

## 2014-05-26 NOTE — Progress Notes (Addendum)
Pt c/o pain in back, right side. Describes as stabbing pain. Pt thinks she pulled a muscle. Pain rated as 8 on pain scale. Tylenol given earlier, without relief. Notified hospitalist, who ordered Flexeril 10 mg po TID. Will administer and continue to monitor throughout night. Pt much more exhausted than night before. O2 at 2L [place]. Bed is in lowest position; call bell within reach. Husband at bedside. Nurse provides emotional support to pt. Will continue to round frequently.  Notified hospitalist about pts BP running low, who ordered to hold BP meds tonight.

## 2014-05-26 NOTE — Progress Notes (Signed)
[facility] PROGRESS NOTE  CHEYNNE VIRDEN KZS:[id number] DOB: [birth year] DOA: 05/22/2014 PCP: Glo Herring., MD  Assessment/Plan: 1. Acute on chronic systolic congestive heart failure. Previously, patient was found to have an ejection fraction of 10-15% since 2012. This was evaluated by cardiac catheterization in 01/2011 and was found to have nonobstructive coronary disease. 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 She's been treated medically. She status post AICD. She had worsening shortness of breath. Cardiac enzymes thus far have been negative. She's been seen by cardiology. Appreciate their assistance. Clinically, she appear to be improving today. Lasix has been switched back to IV today.  She is also on aldactone.  Will continue to observe and diurese. Echocardiogram has revealed an ejection fraction of 5-10% with grade 4 diastolic dysfunction. She's been set up to be seen in the advanced heart failure clinic. We'll continue to monitor the patient. 2. Chronic kidney disease stage III. creatinine is mildly improved since yesterday. Continue diuretics.  Code Status: full code Family Communication: discussed with patient, no family present Disposition Plan: discharge home once improved   Consultants:  Cardiology  Procedures: Echo: - Severely dilated left ventricle with upper normal wall thickness and LVEF approximately 5-10%. There is diffuse hypokinesis consistent with a nonischemic cardiomyopathy and evidence of low cardiac output. Grade 4 diastolic dysfunction noted. Severe biatrial enlargement. Mild mitral regurgitation. Moderate to severe RV enlargement with device wire present and moderately reduced contraction. Moderate tricuspid regurgitation with PASP 38 mm mercury. Small pericardial effusion.     Antibiotics:    HPI/Subjective: Feels that breathing is improving.  No chest pain  Objective: Filed Vitals:   05/26/14 1651  BP: 98/81   Pulse: 89  Temp: 98 F (36.7 C)  Resp: 20    Intake/Output Summary (Last 24 hours) at 05/26/14 1736 Last data filed at 05/26/14 1200  Gross per 24 hour  Intake    480 ml  Output    800 ml  Net   -320 ml   Filed Weights   05/24/14 0531 05/25/14 0844 05/26/14 0431  Weight: 80.377 kg (177 lb 3.2 oz) 78.427 kg (172 lb 14.4 oz) 79.1 kg (174 lb 6.1 oz)    Exam:   General:  NAD, short of breath while talking  Cardiovascular: S1, s2 RRR  Respiratory: crackles at bases  Abdomen: soft, nt,nd, bs+  Musculoskeletal: no edema b/l   Data Reviewed: Basic Metabolic Panel:  Recent Labs Lab 05/22/14 2340 05/23/14 0912 05/24/14 0543 05/25/14 0529 05/26/14 0545  NA 136* 137 135* 136* 135*  K 3.6* 4.5 4.0 4.3 3.5*  CL 95* 97 95* 98 97  CO2 23 26 29 26 23   GLUCOSE 131* 135* 95 122* 124*  BUN 35* 34* 33* 34* 30*  CREATININE 1.84* 1.68* 1.82* 1.55* 1.33*  CALCIUM 9.3 9.1 9.1 9.0 8.9   Liver Function Tests: No results found for this basename: AST, ALT, ALKPHOS, BILITOT, PROT, ALBUMIN,  in the last 168 hours No results found for this basename: LIPASE, AMYLASE,  in the last 168 hours No results found for this basename: AMMONIA,  in the last 168 hours CBC:  Recent Labs Lab 05/22/14 2340 05/24/14 0543  WBC 8.5 6.9  NEUTROABS 3.7  --   HGB 14.8 13.5  HCT 42.9 40.1  MCV 93.1 94.6  PLT 248 208   Cardiac Enzymes:  Recent Labs Lab 05/22/14 2340 05/23/14 0343 05/23/14 0912 05/23/14 1534  TROPONINI <0.30 <0.30 <0.30 <0.30   BNP (last 3 results)  Recent  Labs  10/28/13 1612 10/29/13 0537 05/22/14 2340  PROBNP 15861.0* 8975.0* 13927.0*   CBG: No results found for this basename: GLUCAP,  in the last 168 hours  No results found for this or any previous visit (from the past 240 hour(s)).   Studies: No results found.  Scheduled Meds: . aspirin EC  81 mg Oral Daily  . carvedilol  6.25 mg Oral BID WC  . docusate sodium  200 mg Oral BID  . enoxaparin (LOVENOX)  injection  40 mg Subcutaneous Q24H  . furosemide  40 mg Intravenous BID  . hydrALAZINE  25 mg Oral TID  . isosorbide dinitrate  30 mg Oral BID  . potassium chloride SA  20 mEq Oral Daily  . potassium chloride  40 mEq Oral Q3H  . sodium chloride  3 mL Intravenous Q12H  . spironolactone  12.5 mg Oral Daily   Continuous Infusions:   Principal Problem:   CHF exacerbation Active Problems:   HYPERTENSION   Acute on chronic systolic heart failure   Acute on chronic renal failure   Noncompliance with medication regimen   SOB (shortness of breath)    Time spent: 26mins    MEMON,JEHANZEB  [facility] Pager [phone]. If 7PM-7AM, please contact night-coverage at [email or web address], password [facility] 05/26/2014, 5:36 PM  LOS: 4 days

## 2014-05-26 NOTE — Progress Notes (Signed)
Patient ID: Anita Warren, female   DOB: 12/11/1960, 53 y.o.   MRN: [id number]      Subjective:    SOB improving  Objective:   Temp:  [97.4 F (36.3 C)-99 F (37.2 C)] 97.4 F (36.3 C) (07/31 0431) Pulse Rate:  [75-95] 95 (07/31 0431) Resp:  [18-20] 20 (07/31 0431) BP: (85-127)/(53-83) 127/83 mmHg (07/31 0431) SpO2:  [93 %-100 %] 93 % (07/31 0431) Weight:  [172 lb 14.4 oz (78.427 kg)-174 lb 6.1 oz (79.1 kg)] 174 lb 6.1 oz (79.1 kg) (07/31 0431) Last BM Date: 05/24/14  Filed Weights   05/24/14 0531 05/25/14 0844 05/26/14 0431  Weight: 177 lb 3.2 oz (80.377 kg) 172 lb 14.4 oz (78.427 kg) 174 lb 6.1 oz (79.1 kg)    Intake/Output Summary (Last 24 hours) at 05/26/14 0814 Last data filed at 05/25/14 1300  Gross per 24 hour  Intake    240 ml  Output      0 ml  Net    240 ml    Telemetry: NSR  Exam:  General: NAD  Resp: crackles bilateral bases  Cardiac: RRR, no m/r/g, no JVD, no carotid bruits  GI: abodmen soft, Nt, ND  MSK: LEs warm, no edema  Neuro: no focal deficits  Psych: appropriate affect  Lab Results:  Basic Metabolic Panel:  Recent Labs Lab 05/24/14 0543 05/25/14 0529 05/26/14 0545  NA 135* 136* 135*  K 4.0 4.3 3.5*  CL 95* 98 97  CO2 29 26 23   GLUCOSE 95 122* 124*  BUN 33* 34* 30*  CREATININE 1.82* 1.55* 1.33*  CALCIUM 9.1 9.0 8.9    Liver Function Tests: No results found for this basename: AST, ALT, ALKPHOS, BILITOT, PROT, ALBUMIN,  in the last 168 hours  CBC:  Recent Labs Lab 05/22/14 2340 05/24/14 0543  WBC 8.5 6.9  HGB 14.8 13.5  HCT 42.9 40.1  MCV 93.1 94.6  PLT 248 208    Cardiac Enzymes:  Recent Labs Lab 05/23/14 0343 05/23/14 0912 05/23/14 1534  TROPONINI <0.30 <0.30 <0.30    BNP:  Recent Labs  10/28/13 1612 10/29/13 0537 05/22/14 2340  PROBNP 15861.0* 8975.0* 13927.0*    Coagulation: No results found for this basename: INR,  in the last 168 hours  ECG:   Medications:   Scheduled  Medications: . aspirin EC  81 mg Oral Daily  . carvedilol  25 mg Oral BID WC  . docusate sodium  200 mg Oral BID  . enoxaparin (LOVENOX) injection  40 mg Subcutaneous Q24H  . furosemide  40 mg Oral Daily  . hydrALAZINE  25 mg Oral TID  . isosorbide dinitrate  30 mg Oral BID  . potassium chloride SA  20 mEq Oral Daily  . sodium chloride  3 mL Intravenous Q12H  . spironolactone  12.5 mg Oral Daily     Infusions:     PRN Medications:  sodium chloride, acetaminophen, cyclobenzaprine, guaiFENesin-dextromethorphan, HYDROcodone-acetaminophen, HYDROmorphone (DILAUDID) injection, metoCLOPramide, ondansetron (ZOFRAN) IV, ondansetron (ZOFRAN) IV, sodium chloride   04/2014 Echo Study Conclusions  - Left ventricle: The cavity size was severely dilated. Wall thickness was at the upper limits of normal. Systolic function was severely reduced. The estimated ejection fraction was in the range of 5% to 10%. Consistent with low output heart failure, calculated cardiac output 2.3 L per minute. Diffuse hypokinesis. Doppler parameters are consistent with an irreversible restrictive pattern, indicative of decreased left ventricular diastolic compliance and/or increased left atrial pressure (grade 4 diastolic dysfunction). - Aortic  valve: Mildly calcified annulus. Trileaflet. There was no significant regurgitation. - Mitral valve: There was mild regurgitation. - Left atrium: The atrium was severely dilated. - Right ventricle: The cavity size was moderately to severely dilated. Pacer wire or catheter noted in right ventricle. Systolic function was moderately reduced. - Right atrium: The atrium was severely dilated. Central venous pressure (est): 8 mm Hg. - Tricuspid valve: There was moderate regurgitation. - Pulmonary arteries: PA peak pressure: 38 mm Hg (S). - Pericardium, extracardiac: A small pericardial effusion was identified circumferential to the heart.  Impressions:  - Severely  dilated left ventricle with upper normal wall thickness and LVEF approximately 5-10%. There is diffuse hypokinesis consistent with a nonischemic cardiomyopathy and evidence of low cardiac output. Grade 4 diastolic dysfunction noted. Severe biatrial enlargement. Mild mitral regurgitation. Moderate to severe RV enlargement with device wire present and moderately reduced contraction. Moderate tricuspid regurgitation with PASP 38 mm mercury. Small pericardial effusion.    Assessment/Plan    1. Acute on chronic combined systolic/disastolic heart failure - exacerbating factor likely medication non-compliance, she was unable to afford medications. Reports she has been seen by social work here - NYHA III, LVEF 5-10% by echo Sept 2014, she has a [employer], [employer] ICD  - negative 1.7 liters yesterday, total negative 3.5 liters since admission. Cr trending down with diuresis.  She received lasix 40mg  IV x 2 yesterday - some soft blood pressures at times, since reportedly off her meds for a few months would not reintroduce her back on full dose beta blocker, especially in the setting of such severe systolic dysfunction. Will decrease coreg to 6.25mg  bid, plans for gradual retitration at follow up.  - appears to still be volume overload, will plan for continued IV diuretics today - =hx of ACE angioedema - will need close f/u at discharge 1-2 weeks. - please keep K at 4 and Mg at 2    Carlyle Dolly, M.D., F.A.C.C.

## 2014-05-27 LAB — BASIC METABOLIC PANEL
Anion gap: 12 (ref 5–15)
BUN: 26 mg/dL — ABNORMAL HIGH (ref 6–23)
CO2: 23 mEq/L (ref 19–32)
CREATININE: 1.23 mg/dL — AB (ref 0.50–1.10)
Calcium: 9 mg/dL (ref 8.4–10.5)
Chloride: 98 mEq/L (ref 96–112)
GFR, EST AFRICAN AMERICAN: 57 mL/min — AB (ref >90)
GFR, EST NON AFRICAN AMERICAN: 50 mL/min — AB (ref >90)
Glucose, Bld: 114 mg/dL — ABNORMAL HIGH (ref 70–99)
Potassium: 4.1 mEq/L (ref 3.7–5.3)
Sodium: 133 mEq/L — ABNORMAL LOW (ref 137–147)

## 2014-05-27 LAB — MAGNESIUM: Magnesium: 1.8 mg/dL (ref 1.5–2.5)

## 2014-05-27 NOTE — Progress Notes (Signed)
[facility] PROGRESS NOTE  Anita Warren QQP:[id number] DOB: Sep 16, 1961 DOA: 05/22/2014 PCP: Glo Herring., MD  Assessment/Plan: 1. Acute on chronic systolic congestive heart failure. Previously, patient was found to have an ejection fraction of 10-15% since 2012. This was evaluated by cardiac catheterization in 01/2011 and was found to have nonobstructive coronary disease. 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 She's been treated medically. She is status post AICD. She initially had worsening shortness of breath. Cardiac enzymes thus far have been negative. She's been seen by cardiology. Appreciate their assistance. Clinically, she continues to improve. Lasix is currently being given intravenously. Her urine output has been fair. Creatinine continues to improve diuresis.  She is also on aldactone.  Will continue to observe and diurese. Echocardiogram has revealed an ejection fraction of 5-10% with grade 4 diastolic dysfunction. She's been set up to be seen in the advanced heart failure clinic. We'll continue to monitor the patient. 2. Chronic kidney disease stage III. creatinine is improving with diuresis. Continue diuretics.  Code Status: full code Family Communication: discussed with patient, no family present Disposition Plan: discharge home once improved   Consultants:  Cardiology  Procedures: Echo: - Severely dilated left ventricle with upper normal wall thickness and LVEF approximately 5-10%. There is diffuse hypokinesis consistent with a nonischemic cardiomyopathy and evidence of low cardiac output. Grade 4 diastolic dysfunction noted. Severe biatrial enlargement. Mild mitral regurgitation. Moderate to severe RV enlargement with device wire present and moderately reduced contraction. Moderate tricuspid regurgitation with PASP 38 mm mercury. Small pericardial effusion.     Antibiotics:    HPI/Subjective: Feeling better. Shortness of breath  improving.  Still has mild cough  Objective: Filed Vitals:   05/27/14 0829  BP: 117/87  Pulse: 98  Temp:   Resp:     Intake/Output Summary (Last 24 hours) at 05/27/14 1422 Last data filed at 05/27/14 1000  Gross per 24 hour  Intake    490 ml  Output   1700 ml  Net  -1210 ml   Filed Weights   05/25/14 0844 05/26/14 0431 05/27/14 1100  Weight: 78.427 kg (172 lb 14.4 oz) 79.1 kg (174 lb 6.1 oz) 81.375 kg (179 lb 6.4 oz)    Exam:   General:  NAD,   Cardiovascular: S1, s2 RRR  Respiratory: CTA B  Abdomen: soft, nt,nd, bs+  Musculoskeletal: no edema b/l   Data Reviewed: Basic Metabolic Panel:  Recent Labs Lab 05/23/14 0912 05/24/14 0543 05/25/14 0529 05/26/14 0545 05/27/14 0530  NA 137 135* 136* 135* 133*  K 4.5 4.0 4.3 3.5* 4.1  CL 97 95* 98 97 98  CO2 26 29 26 23 23   GLUCOSE 135* 95 122* 124* 114*  BUN 34* 33* 34* 30* 26*  CREATININE 1.68* 1.82* 1.55* 1.33* 1.23*  CALCIUM 9.1 9.1 9.0 8.9 9.0  MG  --   --   --   --  1.8   Liver Function Tests: No results found for this basename: AST, ALT, ALKPHOS, BILITOT, PROT, ALBUMIN,  in the last 168 hours No results found for this basename: LIPASE, AMYLASE,  in the last 168 hours No results found for this basename: AMMONIA,  in the last 168 hours CBC:  Recent Labs Lab 05/22/14 2340 05/24/14 0543  WBC 8.5 6.9  NEUTROABS 3.7  --   HGB 14.8 13.5  HCT 42.9 40.1  MCV 93.1 94.6  PLT 248 208   Cardiac Enzymes:  Recent Labs Lab 05/22/14 2340 05/23/14 0343 05/23/14 0912 05/23/14  [place] <0.30 <0.30 <0.30 <0.30   BNP (last 3 results)  Recent Labs  10/28/13 1612 10/29/13 0537 05/22/14 2340  PROBNP 15861.0* 8975.0* 13927.0*   CBG: No results found for this basename: GLUCAP,  in the last 168 hours  No results found for this or any previous visit (from the past 240 hour(s)).   Studies: No results found.  Scheduled Meds: . aspirin EC  81 mg Oral Daily  . carvedilol  6.25 mg Oral BID WC  .  docusate sodium  200 mg Oral BID  . enoxaparin (LOVENOX) injection  40 mg Subcutaneous Q24H  . furosemide  40 mg Intravenous BID  . hydrALAZINE  25 mg Oral TID  . isosorbide dinitrate  30 mg Oral BID  . potassium chloride SA  20 mEq Oral Daily  . sodium chloride  3 mL Intravenous Q12H  . spironolactone  12.5 mg Oral Daily   Continuous Infusions:   Principal Problem:   CHF exacerbation Active Problems:   HYPERTENSION   Acute on chronic systolic heart failure   Acute on chronic renal failure   Noncompliance with medication regimen   SOB (shortness of breath)    Time spent: 80mins    Warren,Anita  [facility] Pager [phone]. If 7PM-7AM, please contact night-coverage at [email or web address], password [facility] 05/27/2014, 2:22 PM  LOS: 5 days

## 2014-05-28 LAB — BASIC METABOLIC PANEL
Anion gap: 13 (ref 5–15)
BUN: 28 mg/dL — ABNORMAL HIGH (ref 6–23)
CALCIUM: 8.8 mg/dL (ref 8.4–10.5)
CO2: 25 mEq/L (ref 19–32)
Chloride: 99 mEq/L (ref 96–112)
Creatinine, Ser: 1.37 mg/dL — ABNORMAL HIGH (ref 0.50–1.10)
GFR calc Af Amer: 50 mL/min — ABNORMAL LOW (ref >90)
GFR, EST NON AFRICAN AMERICAN: 43 mL/min — AB (ref >90)
GLUCOSE: 110 mg/dL — AB (ref 70–99)
Potassium: 3.5 mEq/L — ABNORMAL LOW (ref 3.7–5.3)
Sodium: 137 mEq/L (ref 137–147)

## 2014-05-28 MED ORDER — POTASSIUM CHLORIDE CRYS ER 20 MEQ PO TBCR
40.0000 meq | EXTENDED_RELEASE_TABLET | Freq: Once | ORAL | Status: AC
  Administered 2014-05-28: 40 meq via ORAL
  Filled 2014-05-28: qty 2

## 2014-05-28 MED ORDER — FUROSEMIDE 40 MG PO TABS
40.0000 mg | ORAL_TABLET | Freq: Every day | ORAL | Status: DC
  Administered 2014-05-28 – 2014-05-29 (×2): 40 mg via ORAL
  Filled 2014-05-28 (×2): qty 1

## 2014-05-28 NOTE — Progress Notes (Signed)
[facility] PROGRESS NOTE  Anita Warren YPP:[id number] DOB: [birth year] DOA: 05/22/2014 PCP: Glo Herring., MD  Assessment/Plan: 1. Acute on chronic systolic congestive heart failure. Previously, patient was found to have an ejection fraction of 10-15% since 2012. This was evaluated by cardiac catheterization in 01/2011 and was found to have nonobstructive coronary disease. 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 She's been treated medically. She is status post AICD. She initially had worsening shortness of breath. Cardiac enzymes thus far have been negative. She's been seen by cardiology. Appreciate their assistance. Clinically, she continues to improve. Lasix is currently being given intravenously. Her urine output has been fair. She appears to be approaching euvolemic. Will transition to oral Lasix.. Echocardiogram has revealed an ejection fraction of 5-10% with grade 4 diastolic dysfunction. She's been set up to be seen in the advanced heart failure clinic. We'll continue to monitor the patient. 2. Chronic kidney disease stage III. creatinine is improving with diuresis. Continue diuretics.  Code Status: full code Family Communication: discussed with patient, no family present Disposition Plan: discharge home once improved, likely in am   Consultants:  Cardiology  Procedures: Echo: - Severely dilated left ventricle with upper normal wall thickness and LVEF approximately 5-10%. There is diffuse hypokinesis consistent with a nonischemic cardiomyopathy and evidence of low cardiac output. Grade 4 diastolic dysfunction noted. Severe biatrial enlargement. Mild mitral regurgitation. Moderate to severe RV enlargement with device wire present and moderately reduced contraction. Moderate tricuspid regurgitation with PASP 38 mm mercury. Small pericardial effusion.     Antibiotics:    HPI/Subjective: No chest pain or shortness of breath  Objective: Filed  Vitals:   05/28/14 1648  BP: 118/88  Pulse: 101  Temp:   Resp:     Intake/Output Summary (Last 24 hours) at 05/28/14 1925 Last data filed at 05/28/14 1800  Gross per 24 hour  Intake    720 ml  Output    800 ml  Net    -80 ml   Filed Weights   05/26/14 0431 05/27/14 1100 05/28/14 0700  Weight: 79.1 kg (174 lb 6.1 oz) 81.375 kg (179 lb 6.4 oz) 78.881 kg (173 lb 14.4 oz)    Exam:   General:  NAD,   Cardiovascular: S1, s2 RRR  Respiratory: CTA B  Abdomen: soft, nt,nd, bs+  Musculoskeletal: no edema b/l   Data Reviewed: Basic Metabolic Panel:  Recent Labs Lab 05/24/14 0543 05/25/14 0529 05/26/14 0545 05/27/14 0530 05/28/14 0619  NA 135* 136* 135* 133* 137  K 4.0 4.3 3.5* 4.1 3.5*  CL 95* 98 97 98 99  CO2 29 26 23 23 25   GLUCOSE 95 122* 124* 114* 110*  BUN 33* 34* 30* 26* 28*  CREATININE 1.82* 1.55* 1.33* 1.23* 1.37*  CALCIUM 9.1 9.0 8.9 9.0 8.8  MG  --   --   --  1.8  --    Liver Function Tests: No results found for this basename: AST, ALT, ALKPHOS, BILITOT, PROT, ALBUMIN,  in the last 168 hours No results found for this basename: LIPASE, AMYLASE,  in the last 168 hours No results found for this basename: AMMONIA,  in the last 168 hours CBC:  Recent Labs Lab 05/22/14 2340 05/24/14 0543  WBC 8.5 6.9  NEUTROABS 3.7  --   HGB 14.8 13.5  HCT 42.9 40.1  MCV 93.1 94.6  PLT 248 208   Cardiac Enzymes:  Recent Labs Lab 05/22/14 2340 05/23/14 0343 05/23/14 0912 05/23/14 1534  TR[place]ONINI <0.30 <0.30 <  0.30 <0.30   BNP (last 3 results)  Recent Labs  10/28/13 1612 10/29/13 0537 05/22/14 2340  PROBNP 15861.0* 8975.0* 13927.0*   CBG: No results found for this basename: GLUCAP,  in the last 168 hours  No results found for this or any previous visit (from the past 240 hour(s)).   Studies: No results found.  Scheduled Meds: . aspirin EC  81 mg Oral Daily  . carvedilol  6.25 mg Oral BID WC  . docusate sodium  200 mg Oral BID  . enoxaparin  (LOVENOX) injection  40 mg Subcutaneous Q24H  . furosemide  40 mg Oral Daily  . hydrALAZINE  25 mg Oral TID  . isosorbide dinitrate  30 mg Oral BID  . potassium chloride SA  20 mEq Oral Daily  . sodium chloride  3 mL Intravenous Q12H  . spironolactone  12.5 mg Oral Daily   Continuous Infusions:   Principal Problem:   CHF exacerbation Active Problems:   HYPERTENSION   Acute on chronic systolic heart failure   Acute on chronic renal failure   Noncompliance with medication regimen   SOB (shortness of breath)    Time spent: 74mins    MEMON,JEHANZEB  [facility] Pager [phone]. If 7PM-7AM, please contact night-coverage at [email or web address], password [email or web address] 05/28/2014, 7:25 PM  LOS: 6 days

## 2014-05-28 NOTE — Progress Notes (Signed)
05/28/14 0859 K+ 3.5 noted on am labs, text-paged Dr. Roderic Palau to notify. Donavan Foil, RN

## 2014-05-29 DIAGNOSIS — E876 Hypokalemia: Secondary | ICD-10-CM

## 2014-05-29 DIAGNOSIS — I519 Heart disease, unspecified: Secondary | ICD-10-CM

## 2014-05-29 LAB — BASIC METABOLIC PANEL
Anion gap: 11 (ref 5–15)
BUN: 26 mg/dL — ABNORMAL HIGH (ref 6–23)
CALCIUM: 8.9 mg/dL (ref 8.4–10.5)
CO2: 25 meq/L (ref 19–32)
Chloride: 101 mEq/L (ref 96–112)
Creatinine, Ser: 1.22 mg/dL — ABNORMAL HIGH (ref 0.50–1.10)
GFR calc Af Amer: 58 mL/min — ABNORMAL LOW (ref >90)
GFR calc non Af Amer: 50 mL/min — ABNORMAL LOW (ref >90)
GLUCOSE: 101 mg/dL — AB (ref 70–99)
POTASSIUM: 4.1 meq/L (ref 3.7–5.3)
Sodium: 137 mEq/L (ref 137–147)

## 2014-05-29 MED ORDER — SPIRONOLACTONE 12.5 MG HALF TABLET: 12.5000 mg | ORAL_TABLET | Freq: Every day | ORAL | Status: DC

## 2014-05-29 MED ORDER — FUROSEMIDE 40 MG PO TABS: 40.0000 mg | ORAL_TABLET | Freq: Every day | ORAL | Status: DC

## 2014-05-29 MED ORDER — CARVEDILOL 12.5 MG PO TABS: 12.5000 mg | ORAL_TABLET | Freq: Two times a day (BID) | ORAL | Status: DC

## 2014-05-29 NOTE — Progress Notes (Signed)
Nutrition Brief Note  Patient identified due to length of stay. Pt has acute on chronic systolic congestive heart failure and chronic kidney disease stage III. She is transitioning to oral diuretics now. Variable weight hx noted below.  Wt Readings from Last 15 Encounters:  05/29/14 176 lb 8 oz (80.06 kg)  02/11/14 180 lb (81.647 kg)  11/29/13 186 lb (84.369 kg)  10/31/13 194 lb 7.1 oz (88.2 kg)  09/06/13 206 lb (93.441 kg)  09/06/13 206 lb (93.441 kg)  09/03/13 206 lb (93.441 kg)  07/15/13 203 lb 12.8 oz (92.443 kg)  07/06/13 200 lb 1.9 oz (90.774 kg)  07/04/13 206 lb (93.441 kg)  07/02/12 202 lb (91.627 kg)  07/02/12 202 lb (91.627 kg)  06/30/12 202 lb (91.627 kg)  06/01/12 206 lb 3.2 oz (93.532 kg)  04/27/12 195 lb (88.451 kg)    Body mass index is 30.28 kg/(m^2). Patient meets criteria for obesity class I based on current BMI. Pt has weight decrease of 9% over past 6 months which is trending toward significant. She has hx of congestive heart failure and diuretics therapy which is likely primary contributing factor to weight change given her good po intake.  Current diet order is Heart healthy patient is consuming approximately 75-100% of meals at this time. Labs and medications reviewed.   No nutrition interventions warranted at this time. If nutrition issues arise, please consult RD.   Colman Cater MS,RD,CSG,LDN Office: [phone] Pager: [phone]

## 2014-05-29 NOTE — Progress Notes (Signed)
The patient was seen and examined, and I agree with the assessment and plan as documented above, with modifications as noted below.  Pt is euvolemic, in great spirits, and ready to go home. HR mildly elevated, thus slowly increasing Coreg to 12.5 mg bid. Continue Lasix (40 mg q am with 40 mg prn for increasing dyspnea), low-dose spironolactone, hydralazine, and nitrates. Has scheduled appt with Dr. Haroldine Laws in advanced HF clinic.  Should f/u with Dr. Harl Bowie or K. Lawrence NP within next 7-10 days.

## 2014-05-29 NOTE — Discharge Instructions (Signed)

## 2014-05-29 NOTE — Progress Notes (Signed)
Pt discharged home today per Dr. Memon. Pt's IV site D/C'd and WNL. Pt's VSS. Pt provided with home medication list, discharge instructions and prescriptions. Verbalized understanding. Pt left floor via WC in stable condition accompanied by NT. 

## 2014-05-29 NOTE — Discharge Summary (Signed)
Physician Discharge Summary  Anita Warren NWG:[id number] DOB: 08-22-1961 DOA: 05/22/2014  PCP: Glo Herring., MD  Admit date: 05/22/2014 Discharge date: 05/29/2014  Time spent: 40 minutes  Recommendations for Outpatient Follow-up:  1. Patient is set up with home health services.  2. She has been referred to followup in the heart failure clinic. 3. Followup with cardiology next week  Discharge Diagnoses:  Principal Problem:   CHF exacerbation Active Problems:   HYPERTENSION   Acute on chronic systolic heart failure   Acute on chronic renal failure   Noncompliance with medication regimen   SOB (shortness of breath)  acute respiratory failure with hypoxia Nonischemic cardiomyopathy  Discharge Condition: Improved  Diet recommendation: Low salt  Filed Weights   05/27/14 1100 05/28/14 0700 05/29/14 0702  Weight: 81.375 kg (179 lb 6.4 oz) 78.881 kg (173 lb 14.4 oz) 80.06 kg (176 lb 8 oz)    History of present illness and hospital course:  This is a patient with known systolic dysfunction and nonischemic cardiomyopathy who presents to the emergency room with progressive shortness of breath. The patient had difficulty for her medications and had not been on any medications for quite some time. She was treated with intravenous diuretics and was followed by cardiology. Echocardiogram was done which showed an ejection fraction of 5-10% and diffuse hypokinesis. The patient was adequately diuresed with intravenous Lasix and was subsequently transitioned to by mouth Lasix. She was continued on her beta blocker and started on Aldactone. She is now ambulatory on room air without difficulty and appears to be euvolemic. She's been set up with the advance heart failure clinic as well as home health and will follow up with cardiology in the next week. Patient stable for discharge home.  Procedures: Echo: Severely dilated left ventricle with upper normal wall thickness and LVEF  approximately 5-10%. There is diffuse hypokinesis consistent with a nonischemic cardiomyopathy and evidence of low cardiac output. Grade 4 diastolic dysfunction noted. Severe biatrial enlargement. Mild mitral regurgitation. Moderate to severe RV enlargement with device wire present and moderately reduced contraction. Moderate tricuspid regurgitation with PASP 38 mm mercury. Small pericardial effusion.    Consultations:  Cardiology  Discharge Exam: Filed Vitals:   05/29/14 0702  BP: 129/83  Pulse: 97  Temp: 98.3 F (36.8 C)  Resp:     General: NAD Cardiovascular: S1, S2 RRR Respiratory: CTA B  Discharge Instructions You were cared for by a hospitalist during your hospital stay. If you have any questions about your discharge medications or the care you received while you were in the hospital after you are discharged, you can call the unit and asked to speak with the hospitalist on call if the hospitalist that took care of you is not available. Once you are discharged, your primary care physician will handle any further medical issues. Please note that NO REFILLS for any discharge medications will be authorized once you are discharged, as it is imperative that you return to your primary care physician (or establish a relationship with a primary care physician if you do not have one) for your aftercare needs so that they can reassess your need for medications and monitor your lab values.  Discharge Instructions   (HEART FAILURE PATIENTS) Call MD:  Anytime you have any of the following symptoms: 1) 3 pound weight gain in 24 hours or 5 pounds in 1 week 2) shortness of breath, with or without a dry hacking cough 3) swelling in the hands, feet or stomach 4) if  you have to sleep on extra pillows at night in order to breathe.    Complete by:  As directed      Diet - low sodium heart healthy    Complete by:  As directed      Face-to-face encounter (required for Medicare/Medicaid patients)     Complete by:  As directed   I MEMON,JEHANZEB certify that this patient is under my care and that I, or a nurse practitioner or physician's assistant working with me, had a face-to-face encounter that meets the physician face-to-face encounter requirements with this patient on 05/29/2014. The encounter with the patient was in whole, or in part for the following medical condition(s) which is the primary reason for home health care (List medical condition): admitted with chf exacerbation, would benefit from home health RN  The encounter with the patient was in whole, or in part, for the following medical condition, which is the primary reason for home health care:  chf exacerbation  I certify that, based on my findings, the following services are medically necessary home health services:  Nursing  My clinical findings support the need for the above services:  Shortness of breath with activity  Further, I certify that my clinical findings support that this patient is homebound due to:  Shortness of Breath with activity  Reason for Medically Necessary Home Health Services:  Skilled Nursing- Teaching of Disease Process/Symptom Management     Home Health    Complete by:  As directed   To provide the following care/treatments:  RN     Increase activity slowly    Complete by:  As directed             Medication List    STOP taking these medications       torsemide 20 MG tablet  Commonly known as:  DEMADEX      TAKE these medications       acetaminophen 500 MG tablet  Commonly known as:  TYLENOL  Take 1,000 mg by mouth every 6 (six) hours as needed for moderate pain.     aspirin EC 81 MG tablet  Take 81 mg by mouth daily.     aspirin-acetaminophen-caffeine 810-175-10 MG per tablet  Commonly known as:  EXCEDRIN MIGRAINE  Take 1 tablet by mouth every 6 (six) hours as needed for headache or migraine.     carvedilol 12.5 MG tablet  Commonly known as:  COREG  Take 1 tablet (12.5 mg total) by  mouth 2 (two) times daily with a meal.     Co Q 10 100 MG Caps  Take 1 capsule by mouth daily.     furosemide 40 MG tablet  Commonly known as:  LASIX  Take 1 tablet (40 mg total) by mouth daily. Take one extra tablet daily if swelling is getting worse or trouble breathing     hydrALAZINE 25 MG tablet  Commonly known as:  APRESOLINE  Take 1 tablet (25 mg total) by mouth 3 (three) times daily.     isosorbide dinitrate 30 MG tablet  Commonly known as:  ISORDIL  Take 30 mg by mouth 2 (two) times daily.     metoCLOPramide 10 MG tablet  Commonly known as:  REGLAN  Take 1 tablet (10 mg total) by mouth every 8 (eight) hours as needed for nausea.     potassium chloride SA 20 MEQ tablet  Commonly known as:  K-DUR,KLOR-CON  Take 20 mEq by mouth daily.     spironolactone  12.5 mg Tabs tablet  Commonly known as:  ALDACTONE  Take 0.5 tablets (12.5 mg total) by mouth daily.       Allergies  Allergen Reactions  . Peanut-Containing Drug Products Anaphylaxis  . Ace Inhibitors Swelling  . Morphine Nausea And Vomiting  . Demerol Rash       Follow-up Information   Follow up with Glori Bickers, MD On 06/02/2014. (10:15.  Garage parking code to enter [place]. )    Specialty:  Cardiology   Contact information:   [place] [address] [phone]       Follow up with Glo Herring., MD. Schedule an appointment as soon as possible for a visit in 2 weeks.   Specialty:  Internal Medicine   Contact information:   [address] [place] [address] [phone]        The results of significant diagnostics from this hospitalization (including imaging, microbiology, ancillary and laboratory) are listed below for reference.    Significant Diagnostic Studies: Dg Chest 2 View  05/23/2014   CLINICAL DATA:  Cough and shortness of breath for 4 weeks. History of cardiomyopathy.  EXAM: CHEST  2 VIEW  COMPARISON:  10/28/2013  FINDINGS: Cardiac  pacemaker. Diffuse cardiac enlargement without vascular congestion, similar to prior study. No focal airspace disease or consolidation in the lungs. No blunting of costophrenic angles. No pneumothorax.  IMPRESSION: Cardiac enlargement.  No evidence of active pulmonary disease.   Electronically Signed   By: Lucienne Capers M.D.   On: 05/23/2014 01:08    Microbiology: No results found for this or any previous visit (from the past 240 hour(s)).   Labs: Basic Metabolic Panel:  Recent Labs Lab 05/25/14 0529 05/26/14 0545 05/27/14 0530 05/28/14 0619 05/29/14 0453  NA 136* 135* 133* 137 137  K 4.3 3.5* 4.1 3.5* 4.1  CL 98 97 98 99 101  CO2 26 23 23 25 25   GLUCOSE 122* 124* 114* 110* 101*  BUN 34* 30* 26* 28* 26*  CREATININE 1.55* 1.33* 1.23* 1.37* 1.22*  CALCIUM 9.0 8.9 9.0 8.8 8.9  MG  --   --  1.8  --   --    Liver Function Tests: No results found for this basename: AST, ALT, ALKPHOS, BILITOT, PROT, ALBUMIN,  in the last 168 hours No results found for this basename: LIPASE, AMYLASE,  in the last 168 hours No results found for this basename: AMMONIA,  in the last 168 hours CBC:  Recent Labs Lab 05/22/14 2340 05/24/14 0543  WBC 8.5 6.9  NEUTROABS 3.7  --   HGB 14.8 13.5  HCT 42.9 40.1  MCV 93.1 94.6  PLT 248 208   Cardiac Enzymes:  Recent Labs Lab 05/22/14 2340 05/23/14 0343 05/23/14 0912 05/23/14 1534  TROPONINI <0.30 <0.30 <0.30 <0.30   BNP: BNP (last 3 results)  Recent Labs  10/28/13 1612 10/29/13 0537 05/22/14 2340  PROBNP 15861.0* 8975.0* 13927.0*   CBG: No results found for this basename: GLUCAP,  in the last 168 hours     Signed:  MEMON,JEHANZEB  [facility] 05/29/2014, 7:54 PM

## 2014-05-29 NOTE — Progress Notes (Signed)
Consulting cardiologist: Kate Sable MD Primary Cardiologist: Carlyle Dolly MD  Subjective:   Feels great, is anxious to go home. No more chest pain or shortness of breath.    Objective:   Temp:  [98.2 F (36.8 C)-98.7 F (37.1 C)] 98.3 F (36.8 C) (08/03 0702) Pulse Rate:  [90-101] 97 (08/03 0702) Resp:  [20] 20 (08/02 1325) BP: (103-129)/(69-88) 129/83 mmHg (08/03 0702) SpO2:  [93 %-100 %] 94 % (08/03 0702) Weight:  [176 lb 8 oz (80.06 kg)] 176 lb 8 oz (80.06 kg) (08/03 0702) Last BM Date: 05/27/14  Filed Weights   05/27/14 1100 05/28/14 0700 05/29/14 0702  Weight: 179 lb 6.4 oz (81.375 kg) 173 lb 14.4 oz (78.881 kg) 176 lb 8 oz (80.06 kg)    Intake/Output Summary (Last 24 hours) at 05/29/14 0948 Last data filed at 05/29/14 0700  Gross per 24 hour  Intake    480 ml  Output   2150 ml  Net  -1670 ml    Telemetry:ST rate of 101   Exam:  General: No acute distress.  HEENT: Conjunctiva and lids normal, oropharynx clear.  Lungs: Clear to auscultation, nonlabored.  Cardiac: No elevated JVP or bruits. RRR, tachycardic, no gallop or rub.   Abdomen: Normoactive bowel sounds, nontender, nondistended.  Extremities: No pitting edema, distal pulses full.  Neuropsychiatric: Alert and oriented x3, affect appropriate.   Lab Results:  Basic Metabolic Panel:  Recent Labs Lab 05/27/14 0530 05/28/14 0619 05/29/14 0453  NA 133* 137 137  K 4.1 3.5* 4.1  CL 98 99 101  CO2 23 25 25   GLUCOSE 114* 110* 101*  BUN 26* 28* 26*  CREATININE 1.23* 1.37* 1.22*  CALCIUM 9.0 8.8 8.9  MG 1.8  --   --     CBC:  Recent Labs Lab 05/22/14 2340 05/24/14 0543  WBC 8.5 6.9  HGB 14.8 13.5  HCT 42.9 40.1  MCV 93.1 94.6  PLT 248 208    Cardiac Enzymes:  Recent Labs Lab 05/23/14 0343 05/23/14 0912 05/23/14 1534  TROPONINI <0.30 <0.30 <0.30    BNP:  Recent Labs  10/28/13 1612 10/29/13 0537 05/22/14 2340  PROBNP 15861.0* 8975.0* 13927.0*      :   Medications:   Scheduled Medications: . aspirin EC  81 mg Oral Daily  . carvedilol  6.25 mg Oral BID WC  . docusate sodium  200 mg Oral BID  . enoxaparin (LOVENOX) injection  40 mg Subcutaneous Q24H  . furosemide  40 mg Oral Daily  . hydrALAZINE  25 mg Oral TID  . isosorbide dinitrate  30 mg Oral BID  . potassium chloride SA  20 mEq Oral Daily  . sodium chloride  3 mL Intravenous Q12H  . spironolactone  12.5 mg Oral Daily    Infusions:    PRN Medications: sodium chloride, acetaminophen, cyclobenzaprine, guaiFENesin-dextromethorphan, HYDROcodone-acetaminophen, HYDROmorphone (DILAUDID) injection, metoCLOPramide, ondansetron (ZOFRAN) IV, ondansetron (ZOFRAN) IV, sodium chloride   Assessment and Plan:   1.Acute on Chronic Systolic CHF in the setting of NICM (viral) with EF of 10-15%. Related to medical non-compliance secondary to lack of income. Now on disability. Care Management has assisted in procurement of medications.   She has diuresed and appears well-compensated. Weight is down to 176 lbs from admission wt of 180 lbs.  Heart rate is not well controlled running 101-104 at rest, however. Will increase carvedilol to 12.5 mg BID from 6.25 mg BID, to assist with this.  She has appt already scheduled with Dr.  Bensimhon in the CHF clinic. She will follow up with Dr. Harl Bowie in [place]  after evaluation with CHF for ongoing management. Continue lasix at 40 mg daily, creatinine 1.22, improved from admission of 1.84. No ACE with hx of angioedema with use. Low dose spironolactone is being tolerated.   2. S/P AICD: [place] [id number] [phone]); ongoing interrogation and follow up with EP in the [place] office.  3. Hypertension: Not optimal for NICM with EF of 15%. Increase dose of carvedilol to 12.5 mg BID watching HR and BP response. Cannot take ACE due to angioedema. Continue hydralazine.    Phill Myron. Kaleab Frasier NP  05/29/2014, 9:48 AM

## 2014-05-30 NOTE — Progress Notes (Signed)
UR chart review completed.  

## 2014-05-31 NOTE — Progress Notes (Signed)
Patient ID: Anita Warren, female   DOB: 10-16-1961, 53 y.o.   MRN: [id number]  Primary Physician: Glo Herring., MD  Primary Cardiologist: Carlyle Dolly MD [facility])   HPI: Anita Warren is a 53 y.o.female with history of viral, non-ischemic cardiomyopathy, severe systolic dysfunction with EF of 10% s/p ICD pacemaker in the setting of VT 02/2011 ([place] VR model CD1231-40Q (serial [id number]);  Most recent cath 2012 with no CAD.   Admitted July 28 th with acute on chronic systolic CHF in the setting of medical non-compliance, not taking medications for 4 months because she lost her insurance. She was diuresed with IV lasix and transitioned to lasix 40 mg dialy. She was also discharged on 12.5 mg carvedilol twice a day, hydralaizine 25 mg tid, and 30 mg Imdur daily. Discharge weight was 176 pounds. She was not on  an Ace due to angioedema.  She returns for post hospital follow up. Says she has not felt well since she got home. Breathless arriving in the clinic. SOB with ADLs. + Orthopnea/ Denies PND. No shocks.  Weight at home 174 pounds. Taking medications. Poor appetite.   05/23/14 ECHO EF 5-10% RV mod to severely dilated.   SH: Disabled  Lives with husband and son. Does not smoke or drink alcohol.. Religion- She is a pentecostal and does not believe in organ transplants but will receive blood FH: Mom died at 90 CAD         Sister and Brother HTN    ROS: All systems negative except as listed in HPI, PMH and Problem List.  Past Medical History  Diagnosis Date  . Nonischemic cardiomyopathy     LVEF 5-10%, likely viral (no CAD by cath 01/30/11)  . Essential hypertension, benign   . NSVT (nonsustained ventricular tachycardia)   . Obesity   . Hydronephrosis with renal and ureteral calculus obstruction 09/05/2013  . Chronic systolic heart failure   . History of medication noncompliance     Current Outpatient Prescriptions  Medication Sig Dispense Refill  .  acetaminophen (TYLENOL) 500 MG tablet Take 1,000 mg by mouth every 6 (six) hours as needed for moderate pain.      Marland Kitchen aspirin EC 81 MG tablet Take 81 mg by mouth daily.      . carvedilol (COREG) 12.5 MG tablet Take 1 tablet (12.5 mg total) by mouth 2 (two) times daily with a meal.  60 tablet  0  . Coenzyme Q10 (CO Q 10) 100 MG CAPS Take 1 capsule by mouth daily.      . furosemide (LASIX) 40 MG tablet Take 1 tablet (40 mg total) by mouth daily. Take one extra tablet daily if swelling is getting worse or trouble breathing  30 tablet  0  . hydrALAZINE (APRESOLINE) 25 MG tablet Take 1 tablet (25 mg total) by mouth 3 (three) times daily.  90 tablet  0  . isosorbide dinitrate (ISORDIL) 30 MG tablet Take 30 mg by mouth 2 (two) times daily.      . potassium chloride SA (K-DUR,KLOR-CON) 20 MEQ tablet Take 20 mEq by mouth daily.      Marland Kitchen spironolactone (ALDACTONE) 12.5 mg TABS tablet Take 0.5 tablets (12.5 mg total) by mouth daily.  30 tablet  0  . aspirin-acetaminophen-caffeine (EXCEDRIN MIGRAINE) 300-923-30 MG per tablet Take 1 tablet by mouth every 6 (six) hours as needed for headache or migraine.       No current facility-administered medications for this encounter.  PHYSICAL EXAM: Filed Vitals:   06/02/14 1012  Pulse: 95  Weight: 181 lb 6.4 oz (82.283 kg)  SpO2: 100%   Doppler BP 110 General:  Chronically ill appearing. Breathless when talking. Sister present  HEENT: normal Neck: supple. JVP to jawt. Carotids 2+ bilaterally; no bruits. No lymphadenopathy or thryomegaly appreciated. Cor: PMI normal. Regular rate & rhythm. No rubs, or murmurs. + S3  Lungs: clear Abdomen: soft, nontender, nondistended. No hepatosplenomegaly. No bruits or masses. Good bowel sounds. Extremities: no cyanosis, clubbing, rash. Lower extremities cool. R and LLE 1+ edema.  Neuro: alert & orientedx3, cranial nerves grossly intact. Moves all 4 extremities w/o difficulty. Affect pleasant.      ASSESSMENT &  PLAN:   1. A/C Systolic Heart Failure NICM thought to be viral cardiomyopathy noted in 2011. Cath 2012 norm cors ECHO July 28 with EF 5-10% and RV mod-severly dilated.  NYHA IV. She has been struggling since discharge on August 3rd with dyspnea on exertion and at rest.  Appears Cold and Wet. Suspect she has low output heart failure.  Will need to place PICC for CVPs and CO-OX. If CO-OX low will need to start milrionone. Diurese with IV lasix  80 mg  Bid. Once diuresed will need RHC to further evaluate hemodynamics. Stop beta blocker. She will not be placed on Ace due to angio edema.   Will need to discuss advanced therapies. RV may limit options. Due to religous beliefs she will not consider transplant. She is agreeable to blood products.  Will likely need palliative care consult for goals of care.    Admit to ICU.    CLEGG,AMY NP-C  10:51 AM

## 2014-06-02 ENCOUNTER — Encounter (HOSPITAL_COMMUNITY): Payer: Self-pay | Admitting: *Deleted

## 2014-06-02 ENCOUNTER — Encounter (HOSPITAL_COMMUNITY): Payer: Self-pay

## 2014-06-02 ENCOUNTER — Inpatient Hospital Stay (HOSPITAL_COMMUNITY): Payer: Medicaid Other

## 2014-06-02 ENCOUNTER — Inpatient Hospital Stay (HOSPITAL_COMMUNITY)
Admission: AD | Admit: 2014-06-02 | Discharge: 2014-06-07 | DRG: 291 | Disposition: A | Discharge status: Home Health | Payer: Medicaid Other | Source: Ambulatory Visit | Attending: Internal Medicine | Admitting: Internal Medicine

## 2014-06-02 ENCOUNTER — Ambulatory Visit (HOSPITAL_BASED_OUTPATIENT_CLINIC_OR_DEPARTMENT_OTHER)
Admit: 2014-06-02 | Discharge: 2014-06-02 | Disposition: A | Discharge status: Home / Self Care | Payer: Medicaid Other | Attending: Cardiology | Admitting: Cardiology

## 2014-06-02 ENCOUNTER — Other Ambulatory Visit (HOSPITAL_COMMUNITY): Payer: Self-pay | Admitting: Adult Health

## 2014-06-02 VITALS — HR 95 | Wt 181.4 lb

## 2014-06-02 DIAGNOSIS — Z9089 Acquired absence of other organs: Secondary | ICD-10-CM

## 2014-06-02 DIAGNOSIS — Z9119 Patient's noncompliance with other medical treatment and regimen: Secondary | ICD-10-CM

## 2014-06-02 DIAGNOSIS — R570 Cardiogenic shock: Secondary | ICD-10-CM | POA: Diagnosis not present

## 2014-06-02 DIAGNOSIS — Z8249 Family history of ischemic heart disease and other diseases of the circulatory system: Secondary | ICD-10-CM

## 2014-06-02 DIAGNOSIS — I428 Other cardiomyopathies: Secondary | ICD-10-CM | POA: Diagnosis not present

## 2014-06-02 DIAGNOSIS — I4892 Unspecified atrial flutter: Secondary | ICD-10-CM

## 2014-06-02 DIAGNOSIS — I5021 Acute systolic (congestive) heart failure: Secondary | ICD-10-CM

## 2014-06-02 DIAGNOSIS — Z6829 Body mass index (BMI) 29.0-29.9, adult: Secondary | ICD-10-CM | POA: Diagnosis not present

## 2014-06-02 DIAGNOSIS — R0601 Orthopnea: Secondary | ICD-10-CM | POA: Diagnosis present

## 2014-06-02 DIAGNOSIS — I10 Essential (primary) hypertension: Secondary | ICD-10-CM | POA: Diagnosis present

## 2014-06-02 DIAGNOSIS — I5023 Acute on chronic systolic (congestive) heart failure: Principal | ICD-10-CM

## 2014-06-02 DIAGNOSIS — Z888 Allergy status to other drugs, medicaments and biological substances status: Secondary | ICD-10-CM

## 2014-06-02 DIAGNOSIS — E669 Obesity, unspecified: Secondary | ICD-10-CM | POA: Diagnosis present

## 2014-06-02 DIAGNOSIS — Z91199 Patient's noncompliance with other medical treatment and regimen due to unspecified reason: Secondary | ICD-10-CM | POA: Diagnosis not present

## 2014-06-02 DIAGNOSIS — Z833 Family history of diabetes mellitus: Secondary | ICD-10-CM | POA: Diagnosis not present

## 2014-06-02 DIAGNOSIS — Z91010 Allergy to peanuts: Secondary | ICD-10-CM

## 2014-06-02 DIAGNOSIS — Z9851 Tubal ligation status: Secondary | ICD-10-CM | POA: Diagnosis not present

## 2014-06-02 DIAGNOSIS — Z95810 Presence of automatic (implantable) cardiac defibrillator: Secondary | ICD-10-CM | POA: Diagnosis not present

## 2014-06-02 DIAGNOSIS — Z885 Allergy status to narcotic agent status: Secondary | ICD-10-CM

## 2014-06-02 DIAGNOSIS — Z87891 Personal history of nicotine dependence: Secondary | ICD-10-CM | POA: Diagnosis not present

## 2014-06-02 DIAGNOSIS — R000 Tachycardia, unspecified: Secondary | ICD-10-CM | POA: Diagnosis present

## 2014-06-02 DIAGNOSIS — I509 Heart failure, unspecified: Secondary | ICD-10-CM | POA: Diagnosis present

## 2014-06-02 DIAGNOSIS — I484 Atypical atrial flutter: Secondary | ICD-10-CM

## 2014-06-02 LAB — COMPREHENSIVE METABOLIC PANEL
ALT: 27 U/L (ref 0–35)
AST: 18 U/L (ref 0–37)
Albumin: 3.2 g/dL — ABNORMAL LOW (ref 3.5–5.2)
Alkaline Phosphatase: 52 U/L (ref 39–117)
Anion gap: 16 — ABNORMAL HIGH (ref 5–15)
BUN: 26 mg/dL — ABNORMAL HIGH (ref 6–23)
CALCIUM: 9.3 mg/dL (ref 8.4–10.5)
CO2: 23 meq/L (ref 19–32)
Chloride: 100 mEq/L (ref 96–112)
Creatinine, Ser: 1.44 mg/dL — ABNORMAL HIGH (ref 0.50–1.10)
GFR calc Af Amer: 47 mL/min — ABNORMAL LOW (ref >90)
GFR, EST NON AFRICAN AMERICAN: 41 mL/min — AB (ref >90)
Glucose, Bld: 94 mg/dL (ref 70–99)
Potassium: 5.1 mEq/L (ref 3.7–5.3)
Sodium: 139 mEq/L (ref 137–147)
Total Bilirubin: 1.2 mg/dL (ref 0.3–1.2)
Total Protein: 7.1 g/dL (ref 6.0–8.3)

## 2014-06-02 LAB — CBC WITH DIFFERENTIAL/PLATELET
BASOS ABS: 0.1 10*3/uL (ref 0.0–0.1)
Basophils Relative: 1 % (ref 0–1)
EOS PCT: 1 % (ref 0–5)
Eosinophils Absolute: 0.1 10*3/uL (ref 0.0–0.7)
HCT: 37.6 % (ref 36.0–46.0)
Hemoglobin: 12.8 g/dL (ref 12.0–15.0)
LYMPHS ABS: 2.9 10*3/uL (ref 0.7–4.0)
LYMPHS PCT: 41 % (ref 12–46)
MCH: 31.5 pg (ref 26.0–34.0)
MCHC: 34 g/dL (ref 30.0–36.0)
MCV: 92.6 fL (ref 78.0–100.0)
Monocytes Absolute: 0.7 10*3/uL (ref 0.1–1.0)
Monocytes Relative: 9 % (ref 3–12)
NEUTROS ABS: 3.4 10*3/uL (ref 1.7–7.7)
Neutrophils Relative %: 48 % (ref 43–77)
Platelets: 266 10*3/uL (ref 150–400)
RBC: 4.06 MIL/uL (ref 3.87–5.11)
RDW: 15 % (ref 11.5–15.5)
WBC: 7.1 10*3/uL (ref 4.0–10.5)

## 2014-06-02 LAB — ABO/RH: ABO/RH(D): B POS

## 2014-06-02 LAB — PRO B NATRIURETIC PEPTIDE: PRO B NATRI PEPTIDE: 10615 pg/mL — AB (ref 0–125)

## 2014-06-02 LAB — TYPE AND SCREEN
ABO/RH(D): B POS
ANTIBODY SCREEN: NEGATIVE

## 2014-06-02 LAB — MRSA PCR SCREENING: MRSA by PCR: NEGATIVE

## 2014-06-02 LAB — MAGNESIUM: Magnesium: 1.9 mg/dL (ref 1.5–2.5)

## 2014-06-02 LAB — CARBOXYHEMOGLOBIN
CARBOXYHEMOGLOBIN: 1.1 % (ref 0.5–1.5)
CARBOXYHEMOGLOBIN: 1.4 % (ref 0.5–1.5)
METHEMOGLOBIN: 0.7 % (ref 0.0–1.5)
Methemoglobin: 0.6 % (ref 0.0–1.5)
O2 SAT: 52.8 %
O2 SAT: 62.4 %
TOTAL HEMOGLOBIN: 13.3 g/dL (ref 12.0–16.0)
Total hemoglobin: 12.5 g/dL (ref 12.0–16.0)

## 2014-06-02 MED ORDER — POTASSIUM CHLORIDE CRYS ER 20 MEQ PO TBCR: 40.0000 meq | EXTENDED_RELEASE_TABLET | Freq: Every day | ORAL | Status: DC

## 2014-06-02 MED ORDER — ACETAMINOPHEN 325 MG PO TABS
650.0000 mg | ORAL_TABLET | ORAL | Status: DC | PRN
  Administered 2014-06-03 – 2014-06-05 (×2): 650 mg via ORAL
  Filled 2014-06-02 (×2): qty 2

## 2014-06-02 MED ORDER — SODIUM CHLORIDE 0.9 % IJ SOLN
3.0000 mL | Freq: Two times a day (BID) | INTRAMUSCULAR | Status: DC
  Administered 2014-06-02 – 2014-06-03 (×3): 3 mL via INTRAVENOUS

## 2014-06-02 MED ORDER — ASPIRIN EC 81 MG PO TBEC
81.0000 mg | DELAYED_RELEASE_TABLET | Freq: Every day | ORAL | Status: DC
  Administered 2014-06-03 – 2014-06-05 (×3): 81 mg via ORAL
  Filled 2014-06-02 (×4): qty 1

## 2014-06-02 MED ORDER — FUROSEMIDE 10 MG/ML IJ SOLN
80.0000 mg | Freq: Two times a day (BID) | INTRAMUSCULAR | Status: DC
  Administered 2014-06-02 – 2014-06-04 (×4): 80 mg via INTRAVENOUS
  Filled 2014-06-02 (×6): qty 8

## 2014-06-02 MED ORDER — MIDAZOLAM HCL 2 MG/2ML IJ SOLN
INTRAMUSCULAR | Status: AC
  Filled 2014-06-02: qty 2

## 2014-06-02 MED ORDER — ONDANSETRON HCL 4 MG/2ML IJ SOLN: 4.0000 mg | Freq: Four times a day (QID) | INTRAMUSCULAR | Status: DC | PRN

## 2014-06-02 MED ORDER — HYDRALAZINE HCL 10 MG PO TABS
10.0000 mg | ORAL_TABLET | Freq: Three times a day (TID) | ORAL | Status: DC
  Administered 2014-06-02 – 2014-06-03 (×3): 10 mg via ORAL
  Filled 2014-06-02 (×6): qty 1

## 2014-06-02 MED ORDER — SODIUM CHLORIDE 0.9 % IV SOLN: 250.0000 mL | INTRAVENOUS | Status: DC | PRN

## 2014-06-02 MED ORDER — SODIUM CHLORIDE 0.9 % IV SOLN: INTRAVENOUS | Status: DC

## 2014-06-02 MED ORDER — SODIUM CHLORIDE 0.9 % IJ SOLN
10.0000 mL | INTRAMUSCULAR | Status: DC | PRN
  Administered 2014-06-07: 10 mL

## 2014-06-02 MED ORDER — MAGNESIUM SULFATE 40 MG/ML IJ SOLN
2.0000 g | Freq: Once | INTRAMUSCULAR | Status: AC
  Administered 2014-06-02: 2 g via INTRAVENOUS
  Filled 2014-06-02: qty 50

## 2014-06-02 MED ORDER — SODIUM CHLORIDE 0.9 % IJ SOLN: 3.0000 mL | INTRAMUSCULAR | Status: DC | PRN

## 2014-06-02 MED ORDER — ENOXAPARIN SODIUM 30 MG/0.3ML ~~LOC~~ SOLN
30.0000 mg | SUBCUTANEOUS | Status: DC
  Administered 2014-06-02 – 2014-06-03 (×2): 30 mg via SUBCUTANEOUS
  Filled 2014-06-02 (×2): qty 0.3

## 2014-06-02 MED ORDER — SODIUM CHLORIDE 0.9 % IJ SOLN
10.0000 mL | Freq: Two times a day (BID) | INTRAMUSCULAR | Status: DC
  Administered 2014-06-03: 10 mL
  Administered 2014-06-03: 20 mL
  Administered 2014-06-04 – 2014-06-07 (×3): 10 mL

## 2014-06-02 MED ORDER — ISOSORBIDE MONONITRATE ER 30 MG PO TB24
30.0000 mg | ORAL_TABLET | Freq: Every day | ORAL | Status: DC
  Administered 2014-06-03 – 2014-06-07 (×5): 30 mg via ORAL
  Filled 2014-06-02 (×5): qty 1

## 2014-06-02 MED ORDER — FENTANYL CITRATE 0.05 MG/ML IJ SOLN
INTRAMUSCULAR | Status: AC
  Filled 2014-06-02: qty 2

## 2014-06-02 MED ORDER — MILRINONE IN DEXTROSE 20 MG/100ML IV SOLN
0.2500 ug/kg/min | INTRAVENOUS | Status: DC
  Administered 2014-06-02: 0.25 ug/kg/min via INTRAVENOUS
  Filled 2014-06-02 (×2): qty 100

## 2014-06-02 NOTE — Progress Notes (Signed)
  Swan numbers reviewed. C/w cardiogenic shock with severe biventricular HF. Check co-ox. Will start milrinone.  RA 25 PAP 47/34  PCWP  35 Thermo 2.0/1.1 SVR 3094 RA/P/CVP 0 .71  W/u for advanced therapies underway. She has no insurance so will need to apply for Medicare/Medicaid.   Dorice Stiggers,MD 5:05 PM

## 2014-06-02 NOTE — H&P (Addendum)
ADVANCED HEART FAILURE H&P  Primary Physician: Anita Warren., MD  Primary Cardiologist: Anita Dolly MD [facility])  Referred to Advanced Heart Failure by Dr Anita Warren  HPI:  [name] is a 53 y.o.female with history of viral, non-ischemic cardiomyopathy, severe systolic dysfunction with EF of 10% s/p ICD pacemaker in the setting of VT 02/2011 ([place] VR model CD1231-40Q (serial [id number]); Most recent cath 2012 with no CAD.   Admitted July 28 th with acute on chronic systolic CHF in the setting of medical non-compliance, not taking medications for 4 months because she lost her insurance. She was diuresed with IV lasix and transitioned to lasix 40 mg dialy. She was also discharged on 12.5 mg carvedilol twice a day, hydralaizine 25 mg tid, and 30 mg Imdur daily. Discharge weight was 176 pounds. She was not on an Ace due to angioedema.   Today she presented to HF clinic for post hospital follow up.  Says she has not felt well since she got home. Breathless arriving in the clinic. SOB with ADLs. + Orthopnea/ Denies PND. Complains of nausea. No shocks. Weight at home 174 pounds. Taking medications. Poor appetite.    05/23/14 ECHO EF 5-10% RV mod to severely dilated.   SH: Disabled. Lives with husband and son. Does not smoke or drink alcohol.. Religion- She is a pentecostal and does not believe in organ transplants but will receive blood   FH: Mom died at 54--->CAD  Sister and Brother HTN    Review of Systems:     Cardiac Review of Systems: {Y] = yes [ ]  = no  Chest Pain [    ]  Resting SOB [  Y ] Exertional SOB  [Y  ]  Rolla Plate  ]   Pedal Edema [ Y  ]    Palpitations [  ] Syncope  [  ]   Presyncope [   ]  General Review of Systems: [Y] = yes [  ]=no Constitional: recent weight change [  ]; anorexia [  ]; fatigue [Y  ]; nausea [ Y ]; night sweats [  ]; fever [  ]; or chills [  ];                                                                                                                                        Dental: poor dentition[  ]; Last Dentist visit:   Eye : blurred vision [  ]; diplopia [   ]; vision changes [  ];  Amaurosis fugax[  ]; Resp: cough [  ];  wheezing[  ];  hemoptysis[  ]; shortness of breath[  Y]; paroxysmal nocturnal dyspnea[  ]; dyspnea on exertion[ Y ]; or orthopnea[Y  ];  GI:  gallstones[  ], vomiting[  ];  dysphagia[  ]; melena[  ];  hematochezia [  ]; heartburn[  ];   Hx of  Colonoscopy[  ]; GU: kidney stones [  ]; hematuria[  ];   dysuria [  ];  nocturia[  ];  history of     obstruction [  ];                 Skin: rash, swelling[  ];, hair loss[  ];  peripheral edema[  ];  or itching[  ]; Musculosketetal: myalgias[  ];  joint swelling[  ];  joint erythema[  ];  joint pain[  ];  back pain[  ];  Heme/Lymph: bruising[  ];  bleeding[  ];  anemia[  ];  Neuro: TIA[  ];  headaches[  ];  stroke[  ];  vertigo[  ];  seizures[  ];   paresthesias[  ];  difficulty walking[  ];  Psych:depression[  ]; anxiety[  ];  Endocrine: diabetes[  ];  thyroid dysfunction[  ];  Immunizations: Flu [  ]; Pneumococcal[  ];  Other:  Past Medical History  Diagnosis Date  . Nonischemic cardiomyopathy     LVEF 5-10%, likely viral (no CAD by cath 01/30/11)  . Essential hypertension, benign   . NSVT (nonsustained ventricular tachycardia)   . Obesity   . Hydronephrosis with renal and ureteral calculus obstruction 09/05/2013  . Chronic systolic heart failure   . History of medication noncompliance     Medications Prior to Admission  Medication Sig Dispense Refill  . acetaminophen (TYLENOL) 500 MG tablet Take 1,000 mg by mouth every 6 (six) hours as needed for moderate pain.      Marland Kitchen aspirin EC 81 MG tablet Take 81 mg by mouth daily.      Marland Kitchen aspirin-acetaminophen-caffeine (EXCEDRIN MIGRAINE) 250-250-65 MG per tablet Take 1 tablet by mouth every 6 (six) hours as needed for headache or migraine.      . carvedilol (COREG) 12.5 MG tablet Take 1 tablet (12.5 mg total) by  mouth 2 (two) times daily with a meal.  60 tablet  0  . Coenzyme Q10 (CO Q 10) 100 MG CAPS Take 1 capsule by mouth daily.      . furosemide (LASIX) 40 MG tablet Take 1 tablet (40 mg total) by mouth daily. Take one extra tablet daily if swelling is getting worse or trouble breathing  30 tablet  0  . hydrALAZINE (APRESOLINE) 25 MG tablet Take 1 tablet (25 mg total) by mouth 3 (three) times daily.  90 tablet  0  . isosorbide dinitrate (ISORDIL) 30 MG tablet Take 30 mg by mouth 2 (two) times daily.      . potassium chloride SA (K-DUR,KLOR-CON) 20 MEQ tablet Take 20 mEq by mouth daily.      Marland Kitchen spironolactone (ALDACTONE) 12.5 mg TABS tablet Take 0.5 tablets (12.5 mg total) by mouth daily.  30 tablet  0     Allergies  Allergen Reactions  . Peanut-Containing Drug Products Anaphylaxis  . Ace Inhibitors Swelling  . Morphine Nausea And Vomiting  . Demerol Rash    History   Social History  . Marital Status: Married    Spouse Name: N/A    Number of Children: N/A  . Years of Education: N/A   Occupational History  . Nurse [employer], was at [employer] for 16 years    Social History Main Topics  . Smoking status: Never Smoker   . Smokeless tobacco: Never Used  . Alcohol Use: No  . Drug Use: No  . Sexual Activity: Not on file   Other  Topics Concern  . Not on file   Social History Narrative   Lives in [place] with spouse.  3 grown children.  Previously worked in [place] at [employer].    Family History  Problem Relation Age of Onset  . Hypertension    . Diabetes    . Colon cancer Neg Hx     PHYSICAL EXAM: Filed Vitals:   06/02/14 1214  BP: 132/95  Pulse: 93  Temp: 98 F (36.7 C)  Resp: 26   General: Chronically ill appearing. Breathless when talking. Sister present  HEENT: normal  Neck: supple. JVP to jaw. Carotids 2+ bilaterally; no bruits. No lymphadenopathy or thryomegaly appreciated.  Cor: PMI laterally displaced. Regular rate & rhythm. No rubs, or  murmurs. + S3  Lungs: clear  Abdomen: soft, nontender, nondistended. No hepatosplenomegaly. No bruits or masses. Good bowel sounds.  Extremities: no cyanosis, clubbing, rash. Lower extremities cool. R and LLE 1+ edema.  Neuro: alert & orientedx3, cranial nerves grossly intact. Moves all 4 extremities w/o difficulty. Affect pleasant.     No results found for this or any previous visit (from the past 24 hour(s)). No results found.   ASSESSMENT: 1. A/C Systolic Heart Failure NICM thought to be viral cardiomyopathy noted in 2011. Cath 2012 norm cors  ECHO July 28 with EF 5-10% and RV mod-severly dilated. 2. NICM 3. S/P ST Jude 2012 -with a history of VT     PLAN/DISCUSSION: [name] is a 53 year old with a history of chronic systolic heart ECHO EF 7-56%, NICM thought to be from viral cardiomyopathy noted in 2011 admitted from HF clinic acutely decompensated with NHYA class IV symptoms. She has been struggling since discharge on August 3rd with dyspnea on exertion and at rest. Appears Cold and Wet. Suspect she has low output heart failure. Will need to place PICC for CVPs and CO-OX. If CO-OX low will need to start milrionone. Diurese with IV lasix 80 mg Bid. Once diuresed will need RHC to further evaluate hemodynamics. Stop beta blocker. She will not be placed on Ace due to angio edema.   Will need to discuss advanced therapies. RV may limit options. Due to religous beliefs she will not consider transplant. She is agreeable to blood products. Will need palliative care consult for goals of care.   Warren,AMY NP-C  12:05 PM  Patient seen and examined with Anita Grinder, NP. We discussed all aspects of the encounter. I agree with the assessment and plan as stated above.   I had long talk with Ms. Fountain. She has NCM with severe biventricular failure and now with low output despit good medical therapy. She will need advanced therapies likely in the near future. She was initially against  transplant due to religious beliefs but now is willing to consider. I have reviewed her echo and am concerned that RV will not tolerate an LVAD. I do think transplant will be best option. If she refuses this may need to consider TAH. Will place [place] today and will likely need inotropic support. Will have VAD team evaluate. Check blood type.  Benay Spice 12:39 PM

## 2014-06-02 NOTE — Procedures (Signed)
Pulmonary Artery Catheter Insertion Procedure Note KEERSTIN BJELLAND [id number] 04/20/1961  Procedure: Insertion of Pulmonary Artery Catheter Indications: Assessment of intravascular volume  Procedure Details Consent: Risks of procedure as well as the alternatives and risks of each were explained to the (patient/caregiver).  Consent for procedure obtained. Time Out: Verified patient identification, verified procedure, site/side was marked, verified correct patient position, special equipment/implants available, medications/allergies/relevent history reviewed, required imaging and test results available.  Performed  Maximum sterile technique was used including antiseptics, cap, gloves, gown, hand hygiene, mask and sheet. Skin prep: Chlorhexidine; local anesthetic administered An 8FR venous sheath was placed in the RIJ using a modified Seldinger technique. A Swan-Ganz catheter was maneuvered into the pulmonary artery using pressure wave guidance.   Evaluation Blood flow good Complications: No apparent complications Patient did tolerate procedure well. Chest X-ray ordered to verify placement.  CXR: pending.  Glori Bickers MD 06/02/2014, 3:03 PM

## 2014-06-02 NOTE — Progress Notes (Signed)
Peripherally Inserted Central Catheter/Midline Placement  The IV Nurse has discussed with the patient and/or persons authorized to consent for the patient, the purpose of this procedure and the potential benefits and risks involved with this procedure.  The benefits include less needle sticks, lab draws from the catheter and patient may be discharged home with the catheter.  Risks include, but not limited to, infection, bleeding, blood clot (thrombus formation), and puncture of an artery; nerve damage and irregular heat beat.  Alternatives to this procedure were also discussed.  PICC/Midline Placement Documentation        Anita Warren 06/02/2014, 4:05 PM

## 2014-06-03 ENCOUNTER — Inpatient Hospital Stay (HOSPITAL_COMMUNITY): Payer: Medicaid Other

## 2014-06-03 DIAGNOSIS — R570 Cardiogenic shock: Secondary | ICD-10-CM

## 2014-06-03 LAB — BASIC METABOLIC PANEL
ANION GAP: 15 (ref 5–15)
BUN: 26 mg/dL — AB (ref 6–23)
CALCIUM: 8.6 mg/dL (ref 8.4–10.5)
CHLORIDE: 100 meq/L (ref 96–112)
CO2: 22 mEq/L (ref 19–32)
CREATININE: 1.26 mg/dL — AB (ref 0.50–1.10)
GFR calc Af Amer: 56 mL/min — ABNORMAL LOW (ref >90)
GFR calc non Af Amer: 48 mL/min — ABNORMAL LOW (ref >90)
Glucose, Bld: 110 mg/dL — ABNORMAL HIGH (ref 70–99)
Potassium: 3.4 mEq/L — ABNORMAL LOW (ref 3.7–5.3)
Sodium: 137 mEq/L (ref 137–147)

## 2014-06-03 LAB — CARBOXYHEMOGLOBIN
CARBOXYHEMOGLOBIN: 1.8 % — AB (ref 0.5–1.5)
Methemoglobin: 0.3 % (ref 0.0–1.5)
O2 Saturation: 75 %
Total hemoglobin: 11.9 g/dL — ABNORMAL LOW (ref 12.0–16.0)

## 2014-06-03 LAB — MAGNESIUM: Magnesium: 2.2 mg/dL (ref 1.5–2.5)

## 2014-06-03 MED ORDER — SPIRONOLACTONE 12.5 MG HALF TABLET
12.5000 mg | ORAL_TABLET | Freq: Every day | ORAL | Status: DC
  Administered 2014-06-03 – 2014-06-04 (×2): 12.5 mg via ORAL
  Filled 2014-06-03 (×2): qty 1

## 2014-06-03 MED ORDER — MILRINONE IN DEXTROSE 20 MG/100ML IV SOLN
0.2500 ug/kg/min | INTRAVENOUS | Status: DC
  Administered 2014-06-03 – 2014-06-07 (×6): 0.25 ug/kg/min via INTRAVENOUS
  Filled 2014-06-03 (×6): qty 100

## 2014-06-03 MED ORDER — ENOXAPARIN SODIUM 40 MG/0.4ML ~~LOC~~ SOLN
40.0000 mg | SUBCUTANEOUS | Status: DC
  Administered 2014-06-04: 40 mg via SUBCUTANEOUS
  Filled 2014-06-03: qty 0.4

## 2014-06-03 MED ORDER — HYDRALAZINE HCL 10 MG PO TABS
20.0000 mg | ORAL_TABLET | Freq: Three times a day (TID) | ORAL | Status: DC
  Administered 2014-06-03 – 2014-06-04 (×3): 20 mg via ORAL
  Filled 2014-06-03 (×6): qty 2

## 2014-06-03 MED ORDER — POTASSIUM CHLORIDE CRYS ER 20 MEQ PO TBCR
40.0000 meq | EXTENDED_RELEASE_TABLET | Freq: Once | ORAL | Status: AC
  Administered 2014-06-03: 40 meq via ORAL
  Filled 2014-06-03: qty 2

## 2014-06-03 NOTE — Progress Notes (Addendum)
Patient ID: Anita Warren, female   DOB: Mar 23, 1961, 53 y.o.   MRN: [id number]   SUBJECTIVE: Patient is doing much better today on milrinone.  Good diuresis overnight.  No orthopnea, can now lie flat.   Swan numbers today RA 10 PA 52/27 PCWP 25 CI 2.3  Scheduled Meds: . aspirin EC  81 mg Oral Daily  . enoxaparin (LOVENOX) injection  30 mg Subcutaneous Q24H  . furosemide  80 mg Intravenous BID  . hydrALAZINE  20 mg Oral 3 times per day  . isosorbide mononitrate  30 mg Oral Daily  . potassium chloride  40 mEq Oral Once  . sodium chloride  10-40 mL Intracatheter Q12H  . sodium chloride  3 mL Intravenous Q12H  . spironolactone  12.5 mg Oral Daily   Continuous Infusions: . sodium chloride 20 mL/hr at 06/02/14 2000  . milrinone 0.25 mcg/kg/min (06/02/14 2000)   PRN Meds:.sodium chloride, acetaminophen, ondansetron (ZOFRAN) IV, sodium chloride, sodium chloride    Filed Vitals:   06/03/14 0311 06/03/14 0400 06/03/14 0500 06/03/14 0600  BP: 132/84 132/84 105/61 126/81  Pulse: 92 93 98 98  Temp: 97.2 F (36.2 C) 97.2 F (36.2 C) 97 F (36.1 C) 97.3 F (36.3 C)  TempSrc: Core (Comment) Core (Comment)    Resp: 14     Height:      Weight:    176 lb 12.9 oz (80.2 kg)  SpO2: 94% 94% 99% 97%    Intake/Output Summary (Last 24 hours) at 06/03/14 0828 Last data filed at 06/03/14 0600  Gross per 24 hour  Intake  739.5 ml  Output   2700 ml  Net -1960.5 ml    LABS: Basic Metabolic Panel:  Recent Labs  06/02/14 1358 06/03/14 0425  NA 139 137  K 5.1 3.4*  CL 100 100  CO2 23 22  GLUCOSE 94 110*  BUN 26* 26*  CREATININE 1.44* 1.26*  CALCIUM 9.3 8.6  MG 1.9 2.2   Liver Function Tests:  Recent Labs  06/02/14 1358  AST 18  ALT 27  ALKPHOS 52  BILITOT 1.2  PROT 7.1  ALBUMIN 3.2*   No results found for this basename: LIPASE, AMYLASE,  in the last 72 hours CBC:  Recent Labs  06/02/14 1358  WBC 7.1  NEUTROABS 3.4  HGB 12.8  HCT 37.6  MCV 92.6  PLT  266   Cardiac Enzymes: No results found for this basename: CKTOTAL, CKMB, CKMBINDEX, TROPONINI,  in the last 72 hours BNP: No components found with this basename: POCBNP,  D-Dimer: No results found for this basename: DDIMER,  in the last 72 hours Hemoglobin A1C: No results found for this basename: HGBA1C,  in the last 72 hours Fasting Lipid Panel: No results found for this basename: CHOL, HDL, LDLCALC, TRIG, CHOLHDL, LDLDIRECT,  in the last 72 hours Thyroid Function Tests: No results found for this basename: TSH, T4TOTAL, FREET3, T3FREE, THYROIDAB,  in the last 72 hours Anemia Panel: No results found for this basename: VITAMINB12, FOLATE, FERRITIN, TIBC, IRON, RETICCTPCT,  in the last 72 hours  RADIOLOGY: Dg Chest 2 View  05/23/2014   CLINICAL DATA:  Cough and shortness of breath for 4 weeks. History of cardiomyopathy.  EXAM: CHEST  2 VIEW  COMPARISON:  10/28/2013  FINDINGS: Cardiac pacemaker. Diffuse cardiac enlargement without vascular congestion, similar to prior study. No focal airspace disease or consolidation in the lungs. No blunting of costophrenic angles. No pneumothorax.  IMPRESSION: Cardiac enlargement.  No evidence of active  pulmonary disease.   Electronically Signed   By: Lucienne Capers M.D.   On: 05/23/2014 01:08   Dg Chest Port 1 View  06/02/2014   CLINICAL DATA:  Central catheter placement  EXAM: PORTABLE CHEST - 1 VIEW  COMPARISON:  May 23, 2014  FINDINGS: Central catheter tip is in the superior vena cava. There is a Swan-Ganz catheter with the tip in the distal right interlobar pulmonary artery. No pneumothorax. There is atelectatic change in the left lower lobe. There is a questionable small left effusion. Lungs are otherwise clear. There is generalized cardiac enlargement with pulmonary vascularity within normal limits. Pacemaker lead position unchanged. No adenopathy.  IMPRESSION: Swan-Ganz catheter tip is in the distal right interlobar pulmonary artery. Central catheter  tip in superior vena cava. No pneumothorax. Left lower lobe atelectatic change. Probable small pleural effusion on the left. Stable cardiomegaly.   Electronically Signed   By: Lowella Grip M.D.   On: 06/02/2014 17:32    PHYSICAL EXAM General: NAD Neck: JVP 10, no thyromegaly or thyroid nodule.  Lungs: Clear to auscultation bilaterally with normal respiratory effort. CV: Lateral PMI.  Heart regular S1/S2, +S3, no murmur.  No peripheral edema.   Abdomen: Soft, nontender, no hepatosplenomegaly, no distention.  Neurologic: Alert and oriented x 3.  Psych: Normal affect. Extremities: No clubbing or cyanosis.   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  She is doing much better on milrinone gtt + IV lasix with PAC numbers as above.  She is going to need advanced therapy.  Suspect LVAD will be difficult with RV dysfunction.  Better choice may be milrinone gtt with transplant evaluation but workup will be ongoing.  - Continue milrinone and current Lasix today, good UOP so far.  Needs some more diuresis.  - Add spironolactone 12.5 daily and replete K.  - Slow titration of hydralazine/Imdur.   - h/o angioedema with ACEI.  - No beta blocker with initial cardiogenic shock.   Loralie Champagne 06/03/2014 8:35 AM

## 2014-06-04 LAB — CBC
HEMATOCRIT: 23.9 % — AB (ref 36.0–46.0)
HEMOGLOBIN: 8 g/dL — AB (ref 12.0–15.0)
MCH: 31.7 pg (ref 26.0–34.0)
MCHC: 33.5 g/dL (ref 30.0–36.0)
MCV: 94.8 fL (ref 78.0–100.0)
Platelets: 121 10*3/uL — ABNORMAL LOW (ref 150–400)
RBC: 2.52 MIL/uL — AB (ref 3.87–5.11)
RDW: 15.2 % (ref 11.5–15.5)
WBC: 4.6 10*3/uL (ref 4.0–10.5)

## 2014-06-04 LAB — BASIC METABOLIC PANEL
Anion gap: 14 (ref 5–15)
BUN: 20 mg/dL (ref 6–23)
CALCIUM: 8.3 mg/dL — AB (ref 8.4–10.5)
CO2: 23 meq/L (ref 19–32)
CREATININE: 1.14 mg/dL — AB (ref 0.50–1.10)
Chloride: 100 mEq/L (ref 96–112)
GFR calc Af Amer: 63 mL/min — ABNORMAL LOW (ref >90)
GFR calc non Af Amer: 54 mL/min — ABNORMAL LOW (ref >90)
GLUCOSE: 102 mg/dL — AB (ref 70–99)
Potassium: 3.3 mEq/L — ABNORMAL LOW (ref 3.7–5.3)
Sodium: 137 mEq/L (ref 137–147)

## 2014-06-04 LAB — CARBOXYHEMOGLOBIN
Carboxyhemoglobin: 1.5 % (ref 0.5–1.5)
METHEMOGLOBIN: 0.8 % (ref 0.0–1.5)
O2 Saturation: 67.8 %
TOTAL HEMOGLOBIN: 12.8 g/dL (ref 12.0–16.0)

## 2014-06-04 MED ORDER — FUROSEMIDE 40 MG PO TABS
40.0000 mg | ORAL_TABLET | Freq: Two times a day (BID) | ORAL | Status: DC
  Administered 2014-06-04 – 2014-06-07 (×7): 40 mg via ORAL
  Filled 2014-06-04 (×7): qty 1

## 2014-06-04 MED ORDER — SPIRONOLACTONE 25 MG PO TABS
25.0000 mg | ORAL_TABLET | Freq: Every day | ORAL | Status: DC
  Administered 2014-06-05 – 2014-06-07 (×3): 25 mg via ORAL
  Filled 2014-06-04 (×3): qty 1

## 2014-06-04 MED ORDER — POTASSIUM CHLORIDE CRYS ER 10 MEQ PO TBCR
EXTENDED_RELEASE_TABLET | ORAL | Status: AC
  Filled 2014-06-04: qty 4

## 2014-06-04 MED ORDER — POTASSIUM CHLORIDE CRYS ER 20 MEQ PO TBCR
40.0000 meq | EXTENDED_RELEASE_TABLET | Freq: Once | ORAL | Status: AC
  Administered 2014-06-04: 40 meq via ORAL

## 2014-06-04 MED ORDER — HYDRALAZINE HCL 25 MG PO TABS
25.0000 mg | ORAL_TABLET | Freq: Three times a day (TID) | ORAL | Status: DC
  Administered 2014-06-04 – 2014-06-07 (×10): 25 mg via ORAL
  Filled 2014-06-04 (×12): qty 1

## 2014-06-04 MED ORDER — POTASSIUM CHLORIDE CRYS ER 20 MEQ PO TBCR
40.0000 meq | EXTENDED_RELEASE_TABLET | Freq: Once | ORAL | Status: AC
  Administered 2014-06-04: 40 meq via ORAL
  Filled 2014-06-04: qty 2

## 2014-06-04 MED ORDER — POTASSIUM CHLORIDE ER 10 MEQ PO TBCR
40.0000 meq | EXTENDED_RELEASE_TABLET | Freq: Every day | ORAL | Status: DC
  Administered 2014-06-05 – 2014-06-07 (×3): 40 meq via ORAL
  Filled 2014-06-04 (×4): qty 4

## 2014-06-04 NOTE — Progress Notes (Signed)
Patient ID: PIYA MESCH, female   DOB: 1961-02-02, 53 y.o.   MRN: [id number]   SUBJECTIVE:   Patient is doing much better today on milrinone.  Good diuresis overnight again. Down another 4L. Weight down 6 pounds.   Co-ox 68%   Swan numbers today RA 3 PA 42/32 PCWP 19 CO/CI 3.9/2.1 PVR 3.0 SVR 1583  Scheduled Meds: . aspirin EC  81 mg Oral Daily  . enoxaparin (LOVENOX) injection  40 mg Subcutaneous Q24H  . furosemide  80 mg Intravenous BID  . hydrALAZINE  20 mg Oral 3 times per day  . isosorbide mononitrate  30 mg Oral Daily  . potassium chloride  40 mEq Oral Once  . sodium chloride  10-40 mL Intracatheter Q12H  . sodium chloride  3 mL Intravenous Q12H  . spironolactone  12.5 mg Oral Daily   Continuous Infusions: . sodium chloride 10 mL/hr at 06/04/14 0700  . milrinone 0.25 mcg/kg/min (06/04/14 0700)   PRN Meds:.sodium chloride, acetaminophen, ondansetron (ZOFRAN) IV, sodium chloride, sodium chloride    Filed Vitals:   06/04/14 0500 06/04/14 0600 06/04/14 0750 06/04/14 0800  BP:  133/70 92/68 119/89  Pulse:      Temp: 97.9 F (36.6 C) 98.1 F (36.7 C) 98.1 F (36.7 C)   TempSrc:      Resp: 20 16 16 22   Height:      Weight: 77.7 kg (171 lb 4.8 oz)     SpO2:        Intake/Output Summary (Last 24 hours) at 06/04/14 1108 Last data filed at 06/04/14 0900  Gross per 24 hour  Intake   1092 ml  Output   5460 ml  Net  -4368 ml    LABS: Basic Metabolic Panel:  Recent Labs  06/02/14 1358 06/03/14 0425 06/04/14 0438  NA 139 137 137  K 5.1 3.4* 3.3*  CL 100 100 100  CO2 23 22 23   GLUCOSE 94 110* 102*  BUN 26* 26* 20  CREATININE 1.44* 1.26* 1.14*  CALCIUM 9.3 8.6 8.3*  MG 1.9 2.2  --    Liver Function Tests:  Recent Labs  06/02/14 1358  AST 18  ALT 27  ALKPHOS 52  BILITOT 1.2  PROT 7.1  ALBUMIN 3.2*   No results found for this basename: LIPASE, AMYLASE,  in the last 72 hours CBC:  Recent Labs  06/02/14 1358 06/04/14 0438  WBC  7.1 4.6  NEUTROABS 3.4  --   HGB 12.8 8.0*  HCT 37.6 23.9*  MCV 92.6 94.8  PLT 266 121*   Cardiac Enzymes: No results found for this basename: CKTOTAL, CKMB, CKMBINDEX, TROPONINI,  in the last 72 hours BNP: No components found with this basename: POCBNP,  D-Dimer: No results found for this basename: DDIMER,  in the last 72 hours Hemoglobin A1C: No results found for this basename: HGBA1C,  in the last 72 hours Fasting Lipid Panel: No results found for this basename: CHOL, HDL, LDLCALC, TRIG, CHOLHDL, LDLDIRECT,  in the last 72 hours Thyroid Function Tests: No results found for this basename: TSH, T4TOTAL, FREET3, T3FREE, THYROIDAB,  in the last 72 hours Anemia Panel: No results found for this basename: VITAMINB12, FOLATE, FERRITIN, TIBC, IRON, RETICCTPCT,  in the last 72 hours  RADIOLOGY: Dg Chest 2 View  05/23/2014   CLINICAL DATA:  Cough and shortness of breath for 4 weeks. History of cardiomyopathy.  EXAM: CHEST  2 VIEW  COMPARISON:  10/28/2013  FINDINGS: Cardiac pacemaker. Diffuse cardiac enlargement without  vascular congestion, similar to prior study. No focal airspace disease or consolidation in the lungs. No blunting of costophrenic angles. No pneumothorax.  IMPRESSION: Cardiac enlargement.  No evidence of active pulmonary disease.   Electronically Signed   By: Lucienne Capers M.D.   On: 05/23/2014 01:08   Dg Chest Port 1 View  06/02/2014   CLINICAL DATA:  Central catheter placement  EXAM: PORTABLE CHEST - 1 VIEW  COMPARISON:  May 23, 2014  FINDINGS: Central catheter tip is in the superior vena cava. There is a Swan-Ganz catheter with the tip in the distal right interlobar pulmonary artery. No pneumothorax. There is atelectatic change in the left lower lobe. There is a questionable small left effusion. Lungs are otherwise clear. There is generalized cardiac enlargement with pulmonary vascularity within normal limits. Pacemaker lead position unchanged. No adenopathy.  IMPRESSION:  Swan-Ganz catheter tip is in the distal right interlobar pulmonary artery. Central catheter tip in superior vena cava. No pneumothorax. Left lower lobe atelectatic change. Probable small pleural effusion on the left. Stable cardiomegaly.   Electronically Signed   By: Lowella Grip M.D.   On: 06/02/2014 17:32    PHYSICAL EXAM General: NAD Neck: RIJ swan JVP 5, no thyromegaly or thyroid nodule.  Lungs: Clear to auscultation bilaterally with normal respiratory effort. CV: Lateral PMI.  Heart regular S1/S2, +S3, no murmur.  No peripheral edema.   Abdomen: Soft, nontender, no hepatosplenomegaly, no distention.  Neurologic: Alert and oriented x 3.  Psych: Normal affect. Extremities: No clubbing or cyanosis. 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  She is doing much better on milrinone gtt . Swan numbers improved and RA/PCWP ratio better. - She is going to need advanced therapy.  Suspect LVAD may be difficult with RV dysfunction. For now I favor home with milrinone and start transplant w/u. Blood type B+ - can remove swan. Switch to po lasix. - increase spironolactone to 25 daily and replete K.  - Slow titration of hydralazine/Imdur.   - h/o angioedema with ACEI.  - No beta blocker with initial cardiogenic shock.  - can move to SDU. Possibly home tomorrow with milrinone  The patient is critically ill with multiple organ systems failure and requires high complexity decision making for assessment and support, frequent evaluation and titration of therapies, application of advanced monitoring technologies and extensive interpretation of  multiple databases.   Critical Care Time devoted to patient care services described in this note is 40 Minutes.    Glori Bickers MD 06/04/2014 11:08 AM

## 2014-06-04 NOTE — Progress Notes (Signed)
Patient's swan removed; pressure applied until hemostasis achieved.  Vaseline gauze and dry gauze pressure dressing applied.  Site clean, dry & intact. [place], Ardeth Sportsman

## 2014-06-05 LAB — BASIC METABOLIC PANEL
ANION GAP: 12 (ref 5–15)
BUN: 15 mg/dL (ref 6–23)
CO2: 23 meq/L (ref 19–32)
CREATININE: 1.07 mg/dL (ref 0.50–1.10)
Calcium: 8.6 mg/dL (ref 8.4–10.5)
Chloride: 101 mEq/L (ref 96–112)
GFR calc Af Amer: 68 mL/min — ABNORMAL LOW (ref >90)
GFR calc non Af Amer: 59 mL/min — ABNORMAL LOW (ref >90)
Glucose, Bld: 90 mg/dL (ref 70–99)
Potassium: 3.5 mEq/L — ABNORMAL LOW (ref 3.7–5.3)
Sodium: 136 mEq/L — ABNORMAL LOW (ref 137–147)

## 2014-06-05 LAB — CBC
HEMATOCRIT: 41.9 % (ref 36.0–46.0)
HEMOGLOBIN: 14.1 g/dL (ref 12.0–15.0)
MCH: 31.6 pg (ref 26.0–34.0)
MCHC: 33.7 g/dL (ref 30.0–36.0)
MCV: 93.9 fL (ref 78.0–100.0)
Platelets: 223 10*3/uL (ref 150–400)
RBC: 4.46 MIL/uL (ref 3.87–5.11)
RDW: 15.1 % (ref 11.5–15.5)
WBC: 7 10*3/uL (ref 4.0–10.5)

## 2014-06-05 LAB — PRO B NATRIURETIC PEPTIDE: Pro B Natriuretic peptide (BNP): 2867 pg/mL — ABNORMAL HIGH (ref 0–125)

## 2014-06-05 LAB — CARBOXYHEMOGLOBIN
Carboxyhemoglobin: 1.6 % — ABNORMAL HIGH (ref 0.5–1.5)
METHEMOGLOBIN: 0.6 % (ref 0.0–1.5)
O2 Saturation: 71.1 %
Total hemoglobin: 13.1 g/dL (ref 12.0–16.0)

## 2014-06-05 MED ORDER — POTASSIUM CHLORIDE ER 10 MEQ PO TBCR
40.0000 meq | EXTENDED_RELEASE_TABLET | Freq: Once | ORAL | Status: AC
  Administered 2014-06-05: 40 meq via ORAL
  Filled 2014-06-05: qty 4

## 2014-06-05 NOTE — Clinical Documentation Improvement (Signed)
Presents with Acute on Chronic Systolic CHF.   Patient's Cr has dropped from 1.44 on admission to 1.07 today a drop of 0.37 in 48 hours  Please clarify if you feel the patient has: ARF                 CKD - stage if known                 Other Condition    Thank You, Zoila Shutter ,RN Clinical Documentation Specialist:  [place] [facility]

## 2014-06-05 NOTE — Care Management Note (Addendum)
    Page 1 of 2   06/08/2014     7:26:37 AM CARE MANAGEMENT NOTE 06/08/2014  Patient:  Anita Warren, Anita Warren   Account Number:  [id number]  Date Initiated:  06/05/2014  Documentation initiated by:  Elissa Hefty  Subjective/Objective Assessment:   adm w heart failure     Action/Plan:   lives w fam, pcp dr Purcell Nails fusco   Anticipated DC Date:  06/07/2014   Anticipated DC Plan:  [place]  CM consult  Medication Assistance      [facility] Choice  [place]   Choice offered to / List presented to:  C-1 Patient   DME arranged  IV PUMP/EQUIPMENT        HH arranged  HH-1 RN  HH-10 DISEASE MANAGEMENT      Status of service:   Medicare Important Message given?   (If response is "NO", the following Medicare IM given date fields will be blank) Date Medicare IM given:   Medicare IM given by:   Date Additional Medicare IM given:   Additional Medicare IM given by:    Discharge Disposition:  [place]  Per UR Regulation:  Reviewed for med. necessity/level of care/duration of stay  If discussed at [place] of Stay Meetings, dates discussed:   06/08/2014    Comments:  06/07/14  1600 debbie Candice Lunney rn,bsn pt is dc today. pam w [facility] aware. they will have sw and well as rn see pt for home milrinone. have faxed in pt assist form to [place] for eliquis.  8/11  0935 debbie Marvelyn Bouchillon rn,bsn gave pt xarelto 30day free and copay assist card. pt will prob not be able to use copay assist card and only use 30day free. placed pt assist form on chart for xarelto for md to sign. pharm feels they will use eliquis. eliquis pt asssit form on shadow chart and gave pt 30day free and copay assist card.  8/10 1000 debbie Taneal Sonntag rn,bsn spoke w pt about [facility]. went over agency list. only agency that takes self pay is [facility]. [facility] checking to see if they can accept pt. pt states gets disability  thru prev employer but does not have ins. she states at [employer] they had started working on [employer] in [place]. she states employer to work on [employer] disability. have enc pt to ck w [place] medicaid and soc security to be sure the process has been started. spoke w ronda barret that await decision from [facility] if they can take or not for home milrinone.

## 2014-06-05 NOTE — Progress Notes (Signed)
Patient transferred to room 2H17 with belongings (small black bag, large black and white bag, phone, glasses, cloths, and phone charger), chart, and medicines. Pt called husband to inform of transfer. Pt denies complaints at this time. Report given to receiving nurse, Nevin Bloodgood, RN.  Loni Muse, RN

## 2014-06-05 NOTE — Clinical Documentation Improvement (Signed)
Presents with Acute on Chronic Systolic CHF. "No beta blocker with initial cardiogenic shock" documented by Dr. Haroldine Laws.  Please document the clinical indicators/treatment provided to capture/support this diagnosis.   Thank You, Zoila Shutter ,RN Clinical Documentation Specialist:  [place] [facility]

## 2014-06-05 NOTE — Progress Notes (Signed)
Patient ID: Anita Warren, female   DOB: 07-03-1961, 53 y.o.   MRN: [id number]   SUBJECTIVE:   Anita Warren removed yesterday. Lasix held with CVP 3. Co-ox 71. Creatinine continues to improve. Feels very good on milrinone. Weight down another 6 pounds  Scheduled Meds: . aspirin EC  81 mg Oral Daily  . furosemide  40 mg Oral BID  . hydrALAZINE  25 mg Oral 3 times per day  . isosorbide mononitrate  30 mg Oral Daily  . potassium chloride  40 mEq Oral Daily  . sodium chloride  10-40 mL Intracatheter Q12H  . spironolactone  25 mg Oral Daily   Continuous Infusions: . sodium chloride Stopped (06/04/14 1238)  . milrinone 0.25 mcg/kg/min (06/05/14 0216)   PRN Meds:.sodium chloride, acetaminophen, ondansetron (ZOFRAN) IV, sodium chloride    Filed Vitals:   06/05/14 0341 06/05/14 0400 06/05/14 0600 06/05/14 0712  BP:  99/59 118/73 113/72  Pulse:  54 103 105  Temp: 98.5 F (36.9 C)   97.7 F (36.5 C)  TempSrc: Oral   Oral  Resp:  19 23 17   Height:      Weight: 75 kg (165 lb 5.5 oz)     SpO2:  96% 97% 97%    Intake/Output Summary (Last 24 hours) at 06/05/14 0800 Last data filed at 06/05/14 0600  Gross per 24 hour  Intake 1304.66 ml  Output   3750 ml  Net -2445.34 ml    LABS: Basic Metabolic Panel:  Recent Labs  06/02/14 1358 06/03/14 0425 06/04/14 0438 06/05/14 0500  NA 139 137 137 136*  K 5.1 3.4* 3.3* 3.5*  CL 100 100 100 101  CO2 23 22 23 23   GLUCOSE 94 110* 102* 90  BUN 26* 26* 20 15  CREATININE 1.44* 1.26* 1.14* 1.07  CALCIUM 9.3 8.6 8.3* 8.6  MG 1.9 2.2  --   --    Liver Function Tests:  Recent Labs  06/02/14 1358  AST 18  ALT 27  ALKPHOS 52  BILITOT 1.2  PROT 7.1  ALBUMIN 3.2*   No results found for this basename: LIPASE, AMYLASE,  in the last 72 hours CBC:  Recent Labs  06/02/14 1358 06/04/14 0438  WBC 7.1 4.6  NEUTROABS 3.4  --   HGB 12.8 8.0*  HCT 37.6 23.9*  MCV 92.6 94.8  PLT 266 121*   Cardiac Enzymes: No results found for  this basename: CKTOTAL, CKMB, CKMBINDEX, TROPONINI,  in the last 72 hours BNP: No components found with this basename: POCBNP,  D-Dimer: No results found for this basename: DDIMER,  in the last 72 hours Hemoglobin A1C: No results found for this basename: HGBA1C,  in the last 72 hours Fasting Lipid Panel: No results found for this basename: CHOL, HDL, LDLCALC, TRIG, CHOLHDL, LDLDIRECT,  in the last 72 hours Thyroid Function Tests: No results found for this basename: TSH, T4TOTAL, FREET3, T3FREE, THYROIDAB,  in the last 72 hours Anemia Panel: No results found for this basename: VITAMINB12, FOLATE, FERRITIN, TIBC, IRON, RETICCTPCT,  in the last 72 hours  RADIOLOGY: Dg Chest 2 View  05/23/2014   CLINICAL DATA:  Cough and shortness of breath for 4 weeks. History of cardiomyopathy.  EXAM: CHEST  2 VIEW  COMPARISON:  10/28/2013  FINDINGS: Cardiac pacemaker. Diffuse cardiac enlargement without vascular congestion, similar to prior study. No focal airspace disease or consolidation in the lungs. No blunting of costophrenic angles. No pneumothorax.  IMPRESSION: Cardiac enlargement.  No evidence of active pulmonary disease.  Electronically Signed   By: Lucienne Capers M.D.   On: 05/23/2014 01:08   Dg Chest Port 1 View  06/02/2014   CLINICAL DATA:  Central catheter placement  EXAM: PORTABLE CHEST - 1 VIEW  COMPARISON:  May 23, 2014  FINDINGS: Central catheter tip is in the superior vena cava. There is a Swan-Ganz catheter with the tip in the distal right interlobar pulmonary artery. No pneumothorax. There is atelectatic change in the left lower lobe. There is a questionable small left effusion. Lungs are otherwise clear. There is generalized cardiac enlargement with pulmonary vascularity within normal limits. Pacemaker lead position unchanged. No adenopathy.  IMPRESSION: Swan-Ganz catheter tip is in the distal right interlobar pulmonary artery. Central catheter tip in superior vena cava. No pneumothorax.  Left lower lobe atelectatic change. Probable small pleural effusion on the left. Stable cardiomegaly.   Electronically Signed   By: Lowella Grip M.D.   On: 06/02/2014 17:32    PHYSICAL EXAM General: NAD Neck: RIJ swan JVP 5, no thyromegaly or thyroid nodule.  Lungs: Clear to auscultation bilaterally with normal respiratory effort. CV: Lateral PMI.  Heart regular S1/S2, +S3, no murmur.  No peripheral edema.   Abdomen: Soft, nontender, no hepatosplenomegaly, no distention.  Neurologic: Alert and oriented x 3.  Psych: Normal affect. Extremities: No clubbing or cyanosis. 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  She is doing much better on milrinone gttr - h/o angioedema with ACEI.  - No beta blocker with initial cardiogenic shock.  - home today with milrinone - will need to arrange [facility] eval at [facility] or [facility] ([facility]) - recheck CBC (suspect spurious)  D/c meds:  Milrinone 0.25 mcg/kg/min Hydralazine 25 tid Imdur 30 daily Spiro 25 daily Lasix 40 daily (if weight 168 or greater go to 40 bid) Kcl 20 daily  Stop carvedilol and ASA  Anita Bickers MD 06/05/2014 8:00 AM

## 2014-06-06 ENCOUNTER — Inpatient Hospital Stay (HOSPITAL_COMMUNITY): Payer: Medicaid Other

## 2014-06-06 ENCOUNTER — Encounter (HOSPITAL_COMMUNITY): Payer: Self-pay

## 2014-06-06 DIAGNOSIS — Z515 Encounter for palliative care: Secondary | ICD-10-CM

## 2014-06-06 DIAGNOSIS — I2589 Other forms of chronic ischemic heart disease: Secondary | ICD-10-CM

## 2014-06-06 DIAGNOSIS — R0602 Shortness of breath: Secondary | ICD-10-CM

## 2014-06-06 DIAGNOSIS — Z01810 Encounter for preprocedural cardiovascular examination: Secondary | ICD-10-CM

## 2014-06-06 DIAGNOSIS — I4892 Unspecified atrial flutter: Secondary | ICD-10-CM

## 2014-06-06 DIAGNOSIS — I5023 Acute on chronic systolic (congestive) heart failure: Secondary | ICD-10-CM

## 2014-06-06 LAB — BASIC METABOLIC PANEL
Anion gap: 12 (ref 5–15)
BUN: 18 mg/dL (ref 6–23)
CO2: 23 meq/L (ref 19–32)
Calcium: 8.8 mg/dL (ref 8.4–10.5)
Chloride: 103 mEq/L (ref 96–112)
Creatinine, Ser: 0.99 mg/dL (ref 0.50–1.10)
GFR calc Af Amer: 75 mL/min — ABNORMAL LOW (ref >90)
GFR calc non Af Amer: 64 mL/min — ABNORMAL LOW (ref >90)
GLUCOSE: 100 mg/dL — AB (ref 70–99)
POTASSIUM: 4.3 meq/L (ref 3.7–5.3)
Sodium: 138 mEq/L (ref 137–147)

## 2014-06-06 LAB — CBC
HCT: 41.6 % (ref 36.0–46.0)
Hemoglobin: 14.3 g/dL (ref 12.0–15.0)
MCH: 31.7 pg (ref 26.0–34.0)
MCHC: 34.4 g/dL (ref 30.0–36.0)
MCV: 92.2 fL (ref 78.0–100.0)
Platelets: 225 10*3/uL (ref 150–400)
RBC: 4.51 MIL/uL (ref 3.87–5.11)
RDW: 15.1 % (ref 11.5–15.5)
WBC: 7.5 10*3/uL (ref 4.0–10.5)

## 2014-06-06 LAB — CARBOXYHEMOGLOBIN
CARBOXYHEMOGLOBIN: 1.6 % — AB (ref 0.5–1.5)
METHEMOGLOBIN: 0.6 % (ref 0.0–1.5)
O2 SAT: 78.1 %
TOTAL HEMOGLOBIN: 13.5 g/dL (ref 12.0–16.0)

## 2014-06-06 LAB — HEPARIN LEVEL (UNFRACTIONATED): Heparin Unfractionated: 0.23 IU/mL — ABNORMAL LOW (ref 0.30–0.70)

## 2014-06-06 MED ORDER — AMIODARONE HCL IN DEXTROSE 360-4.14 MG/200ML-% IV SOLN
30.0000 mg/h | INTRAVENOUS | Status: DC
  Administered 2014-06-06: 30 mg/h via INTRAVENOUS
  Filled 2014-06-06 (×6): qty 200

## 2014-06-06 MED ORDER — APIXABAN 5 MG PO TABS
5.0000 mg | ORAL_TABLET | Freq: Two times a day (BID) | ORAL | Status: DC
  Filled 2014-06-06 (×2): qty 1

## 2014-06-06 MED ORDER — HEPARIN (PORCINE) IN NACL 100-0.45 UNIT/ML-% IJ SOLN
1250.0000 [IU]/h | INTRAMUSCULAR | Status: DC
  Administered 2014-06-06: 1100 [IU]/h via INTRAVENOUS
  Administered 2014-06-07: 1250 [IU]/h via INTRAVENOUS
  Filled 2014-06-06 (×4): qty 250

## 2014-06-06 MED ORDER — AMIODARONE HCL IN DEXTROSE 360-4.14 MG/200ML-% IV SOLN
60.0000 mg/h | INTRAVENOUS | Status: AC
  Administered 2014-06-06 (×2): 60 mg/h via INTRAVENOUS
  Filled 2014-06-06: qty 200

## 2014-06-06 MED ORDER — BOOST / RESOURCE BREEZE PO LIQD
1.0000 | Freq: Two times a day (BID) | ORAL | Status: DC
  Administered 2014-06-06: 17:00:00 via ORAL
  Administered 2014-06-07 (×2): 1 via ORAL

## 2014-06-06 MED ORDER — AMIODARONE LOAD VIA INFUSION
150.0000 mg | Freq: Once | INTRAVENOUS | Status: AC
  Administered 2014-06-06: 150 mg via INTRAVENOUS
  Filled 2014-06-06: qty 83.34

## 2014-06-06 MED ORDER — HEPARIN BOLUS VIA INFUSION
4000.0000 [IU] | Freq: Once | INTRAVENOUS | Status: AC
  Administered 2014-06-06: 4000 [IU] via INTRAVENOUS
  Filled 2014-06-06: qty 4000

## 2014-06-06 NOTE — Progress Notes (Signed)
VASCULAR LAB PRELIMINARY  PRELIMINARY  PRELIMINARY  PRELIMINARY  Pre-op Cardiac Surgery  LVAD  Carotid Findings:  Bilateral:  1-39% ICA stenosis.  Vertebral artery flow is antegrade.     Upper Extremity Right Left  Brachial Pressures PICC LINE restricted limb  Triphasic 118 Triphasic   Lower  Extremity Right Left  Dorsalis Pedis 143 Triphasic 151 Triphasic  Posterior Tibial 147 Triphasic 136 Triphasic  Ankle/Brachial Indices 1.25 1.28    Findings:  ABIs and Doppler waveforms are within normal limits bilaterally at rest.   Anita Warren, RVS 06/06/2014, 11:21 AM  CESTONE, HELENE, RVT 06/06/2014, 11:21 AM

## 2014-06-06 NOTE — Consult Note (Signed)
[address]       [place] [address]             [phone]        Anita Warren [facility] Medical Record #[id number] Date of Birth: [birth year]  Referring: Redmond School, MD Primary Care: Glo Herring., MD  Chief Complaint:  Advanced heart failure, known nonischemic cardiomyopathy                                 Patient examined, echocardiogram and right heart cath data reviewed  History of Present Illness:     Will 53 year old Afro-American nondiabetic female admitted to the hospital for the second time this summer for advanced heart failure. 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 She developed ventricular arrhythmias and had an AICD placed in 2012. She has not received any shock therapy since implantation. She had coronary arteriograms performed 4 years ago which were normal. The patient was admitted to the hospital last month for [facility] symptoms of heart failure and treated with adjusted oral medications. She presented the following week to the advanced heart failure clinic with recurrent symptoms of low output and fluid retention and was readmitted. On readmission her co-ox was low and a PA catheter was placed. Cardiac index was 1.1 with PA pressures of 47/34, ready to pressure 25. The CVP/PCWP ratio was 0.71. She was subsequently placed on IV milrinone with improved cardiac output, improved co-ox and improve symptoms. She is diuresing well. She is currently in the CCU with a Swan out. CO-OX this a.m. shows 70% mixed venous saturation. She has developed tachycardia and is currently having carvedilol dose adjusted. The plan is for her to obtain Medicaid-Medicare, discharge on home milrinone with home health nursing  The patient denies prior history of heart disease. No history of rheumatic fever. No cardiac difficulties with her pregnancy and delivery over 29 year old son. No history of cardiac murmur. No history  of heavy alcohol or drug abuse. She stopped smoking several years ago. 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  On admission last month A. echocardiogram was performed showing EF of 10%, moderate to severe RV dysfunction with mild TR and moderate MR. No significant AI. No mural thrombus. No significant pericardial effusion.  Patient is status post cholecystectomy completed without cardiac complication.  Earlier this year the patient developed renal colic and passed a right kidney stone. CT of the abdomen that time showed no significant findings other than the ureteral right kidney stone. Current Activity/ Functional Status: Patient lives with her husband and 49 year old son. She is able to perform her housework drive. Recent activity level and exercise tolerance has dramatically decreased.  She a colonoscopy 2013. -Results are pending.   Zubrod Score: At the time of surgery this patient's most appropriate activity status/level should be described as: []     0    Normal activity, no symptoms []     1    Restricted in physical strenuous activity but ambulatory, able to do out light work []     2    Ambulatory and capable of self care, unable to do work activities, up and about                 more than 50%  Of the time                            [  x]    3    Only limited self care, in bed greater than 50% of waking hours []     4    Completely disabled, no self care, confined to bed or chair []     5    Moribund  Past Medical History  Diagnosis Date  . Nonischemic cardiomyopathy     LVEF 5-10%, likely viral (no CAD by cath 01/30/11)  . Essential hypertension, benign   . NSVT (nonsustained ventricular tachycardia)   . Obesity   . Hydronephrosis with renal and ureteral calculus obstruction 09/05/2013  . Chronic systolic heart failure   . History of medication noncompliance     Past Surgical History  Procedure Laterality Date  . Tonsillectomy    . Breast  lumpectomy  1989    L breast- benign  . Tubal ligation    . Cholecystectomy    . Cardiac defibrillator placement  5/12    SJM by JA  . Colonoscopy  07/02/2012    Procedure: COLONOSCOPY;  Surgeon: [name], MD;  Location: [facility];  Service: Endoscopy;  Laterality: N/A;  1:15/PATIENT HAS A DEFIBRILLATOR  . Cystoscopy w/ ureteral stent placement Right 09/06/2013    Procedure: CYSTOSCOPY WITH RIGHT RETROGRADE PYELOGRAM; RIGHT URETERAL STENT PLACEMENT;  Surgeon: [name], MD;  Location: [facility] ORS;  Service: Urology;  Laterality: Right;    History  Smoking status  . Never Smoker   Smokeless tobacco  . Never Used    History  Alcohol Use No    History   Social History  . Marital Status: Married    Spouse Name: N/A    Number of Children: N/A  . Years of Education: N/A   Occupational History  . Nurse [employer], was at [employer] for 16 years    Social History Main Topics  . Smoking status: Never Smoker   . Smokeless tobacco: Never Used  . Alcohol Use: No  . Drug Use: No  . Sexual Activity: Not on file   Other Topics Concern  . Not on file   Social History Narrative   Lives in [place] with spouse.  3 grown children.  Previously worked in [place] at [employer].    Allergies  Allergen Reactions  . Peanut-Containing Drug Products Anaphylaxis  . Ace Inhibitors Swelling  . Morphine Nausea And Vomiting  . Demerol Rash    Current Facility-Administered Medications  Medication Dose Route Frequency Provider Last Rate Last Dose  . 0.9 %  sodium chloride infusion  250 mL Intravenous PRN Amy D Clegg, NP   250 mL at 06/04/14 0700  . 0.9 %  sodium chloride infusion   Intravenous Continuous Jolaine Artist, MD      . acetaminophen (TYLENOL) tablet 650 mg  650 mg Oral Q4H PRN Amy D Clegg, NP   650 mg at 06/05/14 1242  . amiodarone (NEXTERONE PREMIX) 360 MG/200ML (1.8 mg/mL) IV infusion  60 mg/hr Intravenous Continuous Jolaine Artist, MD 33.3  mL/hr at 06/06/14 1315 60 mg/hr at 06/06/14 1315   Followed by  . amiodarone (NEXTERONE PREMIX) 360 MG/200ML (1.8 mg/mL) IV infusion  30 mg/hr Intravenous Continuous Shaune Pascal Bensimhon, MD      . furosemide (LASIX) tablet 40 mg  40 mg Oral BID Jolaine Artist, MD   40 mg at 06/06/14 0948  . heparin ADULT infusion 100 units/mL (25000 units/250 mL)  1,100 Units/hr Intravenous Continuous Georgina Peer, [facility] 11  mL/hr at 06/06/14 1300 1,100 Units/hr at 06/06/14 1300  . hydrALAZINE (APRESOLINE) tablet 25 mg  25 mg Oral 3 times per day Jolaine Artist, MD   25 mg at 06/06/14 0654  . isosorbide mononitrate (IMDUR) 24 hr tablet 30 mg  30 mg Oral Daily Amy D Clegg, NP   30 mg at 06/06/14 0949  . milrinone (PRIMACOR) 20 MG/100ML (0.2 mg/mL) infusion  0.25 mcg/kg/min Intravenous Continuous Jolaine Artist, MD 6 mL/hr at 06/06/14 1300 0.25 mcg/kg/min at 06/06/14 1300  . ondansetron (ZOFRAN) injection 4 mg  4 mg Intravenous Q6H PRN Amy D Clegg, NP      . potassium chloride (K-DUR) CR tablet 40 mEq  40 mEq Oral Daily Jolaine Artist, MD   40 mEq at 06/06/14 0947  . sodium chloride 0.9 % injection 10-40 mL  10-40 mL Intracatheter Q12H Jolaine Artist, MD   10 mL at 06/05/14 0930  . sodium chloride 0.9 % injection 10-40 mL  10-40 mL Intracatheter PRN Jolaine Artist, MD      . spironolactone (ALDACTONE) tablet 25 mg  25 mg Oral Daily Jolaine Artist, MD   25 mg at 06/06/14 9323    Prescriptions prior to admission  Medication Sig Dispense Refill  . acetaminophen (TYLENOL) 500 MG tablet Take 1,000 mg by mouth every 6 (six) hours as needed for moderate pain.      Marland Kitchen aspirin EC 81 MG tablet Take 81 mg by mouth daily.      Marland Kitchen aspirin-acetaminophen-caffeine (EXCEDRIN MIGRAINE) 250-250-65 MG per tablet Take 1 tablet by mouth every 6 (six) hours as needed for headache or migraine.      . carvedilol (COREG) 12.5 MG tablet Take 1 tablet (12.5 mg total) by mouth 2 (two) times daily with a meal.  60  tablet  0  . Coenzyme Q10 (CO Q 10) 100 MG CAPS Take 100 mg by mouth daily.       . furosemide (LASIX) 40 MG tablet Take 40 mg by mouth See admin instructions. Take 1 tablet (40 mg) by mouth daily, may take an additional tablet for increased swelling or trouble breathing      . hydrALAZINE (APRESOLINE) 25 MG tablet Take 1 tablet (25 mg total) by mouth 3 (three) times daily.  90 tablet  0  . isosorbide dinitrate (ISORDIL) 30 MG tablet Take 30 mg by mouth 2 (two) times daily.      . potassium chloride SA (K-DUR,KLOR-CON) 20 MEQ tablet Take 20 mEq by mouth daily.      Marland Kitchen spironolactone (ALDACTONE) 25 MG tablet Take 12.5 mg by mouth daily.        Family History  Problem Relation Age of Onset  . Hypertension    . Diabetes    . Colon cancer Neg Hx      Review of Systems:     Cardiac Review of Systems: Y or N  Chest Pain [ no   ]  Resting SOB Totoro.Blacker   ] Exertional SOB  Totoro.Blacker  ]  Orthopnea [ yes ]   Pedal Edema [ yes  ]    Palpitations [no  ] Syncope  [no  ]   Presyncope [ no  ]  General Review of Systems: [Y] = yes [  ]=no Constitional: recent weight change [  ]; anorexia [  ]; fatigue [  ]; nausea [  ]; night sweats [  ]; fever [  ]; or chills [  ]  Dental: poor dentition[ yes-right upper broken tooth ]; Last Dentist visit: Greater than one year  Eye : blurred vision [  ]; diplopia [   ]; vision changes [  ];  Amaurosis fugax[  ]; Resp: cough [  ];  wheezing[  ];  hemoptysis[  ]; shortness of breath[  yes yes]; paroxysmal nocturnal dyspnea[  ]; dyspnea on exertion[yes  ]; or orthopnea[yes  ];  GI:  gallstones[  ], vomiting[  ];  dysphagia[  ]; melena[  ];  hematochezia [  ]; heartburn[  ];   Hx of  Colonoscopy[  ]; GU: kidney stones Totoro.Blacker  ]; hematuria[  ];   dysuria [  ];  nocturia[  ];  history of     obstruction [  ]; urinary frequency [  ]             Skin: rash, swelling[  ];, hair loss[  ];  peripheral edema[  ];  or itching[   ]; Musculosketetal: myalgias[  ];  joint swelling[  ];  joint erythema[  ];  joint pain[  ];  back pain[  ];  Heme/Lymph: bruising[  ];  bleeding[  ];  anemia[  ];  Neuro: TIA[  ];  headaches[  ];  stroke[  ];  vertigo[  ];  seizures[  ];   paresthesias[  ];  difficulty walking[  ];  Psych:depression[  ]; anxiety[  ];  Endocrine: diabetes[no  ];  thyroid dysfunction[  ];  Immunizations: Flu [  ]; Pneumococcal[  ];  Other: Carotid duplex scan negative for carotid stenosis  Physical Exam: BP 120/93  Pulse 104  Temp(Src) 98.7 F (37.1 C) (Oral)  Resp 19  Ht 5\' 4"  (1.626 m)  Wt 171 lb 4.8 oz (77.7 kg)  BMI 29.39 kg/m2  SpO2 98%  LMP 10/28/1999  General appearance-well appearing middle-aged female in the CCU no acute distress. She has a right arm PICC line with infusion of milrinone HEENT normocephalic   broken right upper tooth   pupils equal Neck-mild JVD without bruit Thorax-no deformity or tenderness, breath sounds clear Cardiac-sinus rhythm, positive S3 gallop, no murmur Abdomen-nontender no rim daily no pulsatile mass Extremities-no clubbing cyanosis edema or tenderness Vascular-peripheral pulses intact Neurologic-alert and oriented, cranial nerves grossly intact. Moves all extremities. Appropriate and responsive   Diagnostic Studies & Laboratory data:   Echocardiogram, right heart cath, abdominal CT scan, carotid duplex scan, chest x-ray all reviewed. CT chest pending PFTs pending  Recent Radiology Findings:   No results found.    Recent Lab Findings: Lab Results  Component Value Date   WBC 7.0 06/05/2014   HGB 14.1 06/05/2014   HCT 41.9 06/05/2014   PLT 223 06/05/2014   GLUCOSE 100* 06/06/2014   CHOL 140 10/31/2013   TRIG 90 10/31/2013   HDL 31* 10/31/2013   LDLCALC 91 10/31/2013   ALT 27 06/02/2014   AST 18 06/02/2014   NA 138 06/06/2014   K 4.3 06/06/2014   CL 103 06/06/2014   CREATININE 0.99 06/06/2014   BUN 18 06/06/2014   CO2 23 06/06/2014   TSH 2.790 05/22/2014   INR  1.39 10/29/2013      Assessment / Plan:     53 year old female with post viral nonischemic cardiomyopathy and 2011 now with class 4 advanced heart failure admitted with elevated filling pressures and low cardiac output. Clinically improved on milrinone.   On her initial echocardiogram she has significant RV dysfunction which will be problematic for good  outcome with LVAD. Agree with plan to follow her in the heart failure clinic on home milrinone and to obtain followup echocardiogram in 8-12 weeks.  I discussed the concept of implantable LVAD therapy for her advanced heart failure and she understands that she is still in the evaluation phase and for now her chest therapy will be home inotropic support with milrinone.     @ME1 @ 06/06/2014 1:38 PM

## 2014-06-06 NOTE — Progress Notes (Signed)
  Anita Warren is a 53 y.o. with  has a past medical history of Nonischemic cardiomyopathy; Essential hypertension, benign; NSVT (nonsustained ventricular tachycardia); Obesity; Hydronephrosis with renal and ureteral calculus obstruction (09/05/2013); Chronic systolic heart failure; and History of medication noncompliance.. Currently being evaluated for advanced therapies which include Left Ventricular Assist Device implantation.   Have had lengthy discussion with patient and husband about the indications and alternatives to Heartmate II VAD placement. I have reviewed in full the following; caregiver role, life style changes, follow up care, discharge planning,  and driveline management care. The patient was provided with educational resources to take home and read more about the device offered. The patient understands that from this discussion it does not mean that they will receive the device, but that depends on an extensive evaluation process. The patient is aware of the fact that if at anytime they want to stop the evaluation process they can.  VAD evaluation consent reviewed and signed by pt.  Patient and caregiver asked questions and had good interaction with VAD coordinator.  Pt identified husband as primary caregiver. Explained need for 24/7 care when pt discharged home;  both pt and caregiver verbalized understanding of above.   Explained that LVAD can be implanted for two indications: 1. Bridge to transplant - used for patients who cannot safely wait for heart transplant.  2. Destination therapy - used for patients until end of life or recovery of heart function.  Patient has a family member who has a VAD; he has visited with patient and demonstrated VAD and talked with her about his experiences.

## 2014-06-06 NOTE — Progress Notes (Signed)
Patient ID: Anita Warren, female   DOB: [birth year], 53 y.o.   MRN: [id number]   SUBJECTIVE:    Co-ox 78%. Creatinine continues to improve. Weight up 6 pounds. Tachycardic this am.   Denies SOB/Orthopnea.   Waiting on home milrinone with approval.   Pro BNP 10615> 2867  Scheduled Meds: . aspirin EC  81 mg Oral Daily  . furosemide  40 mg Oral BID  . hydrALAZINE  25 mg Oral 3 times per day  . isosorbide mononitrate  30 mg Oral Daily  . potassium chloride  40 mEq Oral Daily  . sodium chloride  10-40 mL Intracatheter Q12H  . spironolactone  25 mg Oral Daily   Continuous Infusions: . sodium chloride Stopped (06/04/14 1238)  . milrinone 0.25 mcg/kg/min (06/05/14 1817)   PRN Meds:.sodium chloride, acetaminophen, ondansetron (ZOFRAN) IV, sodium chloride    Filed Vitals:   06/05/14 2126 06/06/14 0000 06/06/14 0400 06/06/14 0500  BP: 118/84     Pulse:  104 110   Temp:  98.1 F (36.7 C) 97.6 F (36.4 C)   TempSrc:  Oral Oral   Resp:      Height:    5\' 4"  (1.626 m)  Weight:    171 lb 4.8 oz (77.7 kg)  SpO2:  98% 98%     Intake/Output Summary (Last 24 hours) at 06/06/14 0648 Last data filed at 06/06/14 0600  Gross per 24 hour  Intake    672 ml  Output   2900 ml  Net  -2228 ml    LABS: Basic Metabolic Panel:  Recent Labs  06/05/14 0500 06/06/14 0442  NA 136* 138  K 3.5* 4.3  CL 101 103  CO2 23 23  GLUCOSE 90 100*  BUN 15 18  CREATININE 1.07 0.99  CALCIUM 8.6 8.8   Liver Function Tests: No results found for this basename: AST, ALT, ALKPHOS, BILITOT, PROT, ALBUMIN,  in the last 72 hours No results found for this basename: LIPASE, AMYLASE,  in the last 72 hours CBC:  Recent Labs  06/04/14 0438 06/05/14 0803  WBC 4.6 7.0  HGB 8.0* 14.1  HCT 23.9* 41.9  MCV 94.8 93.9  PLT 121* 223   Cardiac Enzymes: No results found for this basename: CKTOTAL, CKMB, CKMBINDEX, TROPONINI,  in the last 72 hours BNP: No components found with this basename:  POCBNP,  D-Dimer: No results found for this basename: DDIMER,  in the last 72 hours Hemoglobin A1C: No results found for this basename: HGBA1C,  in the last 72 hours Fasting Lipid Panel: No results found for this basename: CHOL, HDL, LDLCALC, TRIG, CHOLHDL, LDLDIRECT,  in the last 72 hours Thyroid Function Tests: No results found for this basename: TSH, T4TOTAL, FREET3, T3FREE, THYROIDAB,  in the last 72 hours Anemia Panel: No results found for this basename: VITAMINB12, FOLATE, FERRITIN, TIBC, IRON, RETICCTPCT,  in the last 72 hours  RADIOLOGY: Dg Chest 2 View  05/23/2014   CLINICAL DATA:  Cough and shortness of breath for 4 weeks. History of cardiomyopathy.  EXAM: CHEST  2 VIEW  COMPARISON:  10/28/2013  FINDINGS: Cardiac pacemaker. Diffuse cardiac enlargement without vascular congestion, similar to prior study. No focal airspace disease or consolidation in the lungs. No blunting of costophrenic angles. No pneumothorax.  IMPRESSION: Cardiac enlargement.  No evidence of active pulmonary disease.   Electronically Signed   By: Lucienne Capers M.D.   On: 05/23/2014 01:08   Dg Chest Port 1 View  06/02/2014   CLINICAL DATA:  Central catheter placement  EXAM: PORTABLE CHEST - 1 VIEW  COMPARISON:  May 23, 2014  FINDINGS: Central catheter tip is in the superior vena cava. There is a Swan-Ganz catheter with the tip in the distal right interlobar pulmonary artery. No pneumothorax. There is atelectatic change in the left lower lobe. There is a questionable small left effusion. Lungs are otherwise clear. There is generalized cardiac enlargement with pulmonary vascularity within normal limits. Pacemaker lead position unchanged. No adenopathy.  IMPRESSION: Swan-Ganz catheter tip is in the distal right interlobar pulmonary artery. Central catheter tip in superior vena cava. No pneumothorax. Left lower lobe atelectatic change. Probable small pleural effusion on the left. Stable cardiomegaly.   Electronically  Signed   By: Lowella Grip M.D.   On: 06/02/2014 17:32    PHYSICAL EXAM- CVP 9 General: NAD Neck: RIJ swan JVP 7-8, no thyromegaly or thyroid nodule.  Lungs: Clear to auscultation bilaterally with normal respiratory effort. CV: Lateral PMI.  Heart regular S1/S2, +S3, no murmur.  No peripheral edema.   Abdomen: Soft, nontender, no hepatosplenomegaly, no distention.  Neurologic: Alert and oriented x 3.  Psych: Normal affect. Extremities: No clubbing or cyanosis. RUE PICC  TELEMETRY: Tachycardiac 120s ASSESSMENT AND PLAN: Mrs Darrick Meigs is a 53 year old with a history of chronic systolic heart failure, EF 5-10% by 7/15 echo, nonischemic cardiomyopathy thought to be from viral cardiomyopathy noted in 2011 admitted from HF clinic acutely decompensated with NHYA class IV symptoms.  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  She is doing much better on milrinone gtt.   This am tachycardic HR in 120s. Check EKG now. May need to add low dose carvedilol. Hold discharge for now.   - h/o angioedema with ACEI.  - No beta blocker with initial cardiogenic shock.  - home today with milrinone - will need to arrange transplant eval at [facility] or [facility] (she has asked for [facility])  CLEGG,AMY NP-C  06/06/2014 6:48 AM  Patient seen and examined with Darrick Grinder, NP. We discussed all aspects of the encounter. I agree with the assessment and plan as stated above.   She feels well this am but is in atrial flutter with HRs 120s. Asymptomatic. Will likely not tolerate long. Start amio and heparin. Hopefully she will convert. If not, will need DC-CV. Will discuss getting Eliquis with CM.   Still working to get milrinone to facilitate discharge.  Hold d/c for now.   Jasiri Hanawalt,MD 8:05 AM

## 2014-06-06 NOTE — Progress Notes (Signed)
ANTICOAGULATION CONSULT NOTE - Follow up  Pharmacy Consult for heparin Indication: atrial flutter  Allergies  Allergen Reactions  . Peanut-Containing Drug Products Anaphylaxis  . Ace Inhibitors Swelling  . Morphine Nausea And Vomiting  . Demerol Rash    Patient Measurements: Height: 5\' 4"  (162.6 cm) Weight: 171 lb 4.8 oz (77.7 kg) IBW/kg (Calculated) : 54.7 Heparin Dosing Weight: 77kg  Vital Signs: Temp: 98.8 F (37.1 C) (08/11 1533) Temp src: Oral (08/11 1533) BP: 121/83 mmHg (08/11 1900) Pulse Rate: 108 (08/11 1530)  Labs:  Recent Labs  06/04/14 0438 06/05/14 0500 06/05/14 0803 06/06/14 0442 06/06/14 1812  HGB 8.0*  --  14.1  --  14.3  HCT 23.9*  --  41.9  --  41.6  PLT 121*  --  223  --  225  HEPARINUNFRC  --   --   --   --  0.23*  CREATININE 1.14* 1.07  --  0.99  --     Estimated Creatinine Clearance: 67.1 ml/min (by C-G formula based on Cr of 0.99).   Medical History: Past Medical History  Diagnosis Date  . Nonischemic cardiomyopathy     LVEF 5-10%, likely viral (no CAD by cath 01/30/11)  . Essential hypertension, benign   . NSVT (nonsustained ventricular tachycardia)   . Obesity   . Hydronephrosis with renal and ureteral calculus obstruction 09/05/2013  . Chronic systolic heart failure   . History of medication noncompliance     Assessment: 53 year old female admitted 06/02/2014 being evaluated for LVAD with new aflutter this morning. Patient may need cardioversion, will start IV amio load and heparin in preparation. Not on anticoagulation prior to admission. Inotropes likely contributing to arrhythmias but deemed necessary to continue by cardiology.   CBC has been stable for the exception of some aberrant labs on 8/9. CBC this evening remains stable.  Goal of Therapy:  Heparin level 0.3-0.7 units/ml Monitor platelets by anticoagulation protocol: Yes   Plan:  Increase heparin infusion to 1250 units/hr Check anti-Xa level with am labs Continue to  monitor H&H and platelets  Thank you for allowing pharmacy to be a part of this patients care team.  Rowe Robert Pharm.D., BCPS, AQ-Cardiology Clinical Pharmacist 06/06/2014 7:37 PM Pager: [phone] Phone: [phone]

## 2014-06-06 NOTE — Progress Notes (Signed)
INITIAL NUTRITION ASSESSMENT  DOCUMENTATION CODES Per approved criteria  -Not Applicable   INTERVENTION: - Resource Breeze BID - Encouraged increased meal intake with at least 50% meal completion - RD to continue to monitor   NUTRITION DIAGNOSIS: Predicted suboptimal energy intake related to early satiety as evidenced by pt report.   Goal: Pt to consume >90% of meals/supplements  Monitor:  Weights, labs, intake  Reason for Assessment: Consult for VAD evaluation   53 y.o. female  Admitting Dx: Severe biventricular failure   ASSESSMENT: Pt with history of viral, non-ischemic cardiomyopathy, severe systolic dysfunction with EF of 10% s/p ICD pacemaker in the setting of VT 02/2011. Was admitted to [facility] from 7/27-05/29/14 for CHF exacerbation.   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 and said just the smell of Ensure makes her sick - Agreeable to trying some [employer] - Nutrition focused physical exam performed which was WNL - Said she is going to try to eat at least 50% of her meals and said the only reason she didn't eat more of her lunch today was because the chicken was dry  - Reports 12 pound fluid weight loss in the past month   - PO intake documented as 75-100% of meals during admission earlier this month on heart healthy diet   Height: Ht Readings from Last 1 Encounters:  06/06/14 _0  (1.626 m)    Weight: Wt Readings from Last 1 Encounters:  06/06/14 171 lb 4.8 oz (77.7 kg)     Ideal Body Weight: 120 lbs  % Ideal Body Weight: 142%  Wt Readings from Last 10 Encounters:  06/06/14 171 lb 4.8 oz (77.7 kg)  06/02/14 181 lb 6.4 oz (82.283 kg)  05/29/14 176 lb 8 oz (80.06 kg)  02/11/14 180 lb (81.647 kg)  11/29/13 186 lb (84.369 kg)  10/31/13 194 lb 7.1 oz (88.2 kg)  09/06/13 206 lb (93.441 kg)  09/06/13 206 lb (93.441 kg)  09/03/13 206 lb (93.441 kg)  07/15/13 203 lb 12.8 oz (92.443 kg)    Usual Body Weight: 174 lbs at home  % Usual Body Weight: 98%  BMI:  Body mass index is 29.39 kg/(m^2).  Estimated Nutritional Needs: Kcal: 1400-1600 Protein: 65-80g Fluid: per MD  Skin: intact  Diet Order: Sodium Restricted  EDUCATION NEEDS: -No education needs identified at this time   Intake/Output Summary (Last 24 hours) at 06/06/14 1519 Last data filed at 06/06/14 1500  Gross per 24 hour  Intake 1935.09 ml  Output   2250 ml  Net -314.91 ml    Last BM: 8/10  Labs:   Recent Labs Lab 06/02/14 1358 06/03/14 0425 06/04/14 0438 06/05/14 0500 06/06/14 0442  NA 139 137 137 136* 138  K 5.1 3.4* 3.3* 3.5* 4.3  CL 100 100 100 101 103  CO2 _1 BUN 26* 26* _2 CREATININE 1.44* 1.26* 1.14* 1.07 0.99  CALCIUM 9.3  8.6 8.3* 8.6 8.8  MG 1.9 2.2  --   --   --   GLUCOSE 94 110* 102* 90 100*    CBG (last 3)  No results found for this basename: GLUCAP,  in the last 72 hours  Scheduled Meds: . furosemide  40 mg Oral BID  . hydrALAZINE  25 mg Oral 3 times per day  . isosorbide mononitrate  30 mg Oral Daily  . potassium chloride  40 mEq Oral Daily  . sodium chloride  10-40 mL Intracatheter Q12H  . spironolactone  25 mg Oral Daily    Continuous Infusions: . sodium chloride Stopped (06/04/14 1238)  . amiodarone 30 mg/hr (06/06/14 1500)  . heparin 1,100 Units/hr (06/06/14 1500)  . milrinone 0.25 mcg/kg/min (06/06/14 1500)    Past Medical History  Diagnosis Date  . Nonischemic cardiomyopathy     LVEF 5-10%, likely viral (no  CAD by cath 01/30/11)  . Essential hypertension, benign   . NSVT (nonsustained ventricular tachycardia)   . Obesity   . Hydronephrosis with renal and ureteral calculus obstruction 09/05/2013  . Chronic systolic heart failure   . History of medication noncompliance     Past Surgical History  Procedure Laterality Date  . Tonsillectomy    . Breast lumpectomy  1989    L breast- benign  . Tubal ligation    . Cholecystectomy    . Cardiac defibrillator placement  5/12    SJM by JA  . Colonoscopy  07/02/2012    Procedure: COLONOSCOPY;  Surgeon: [name], MD;  Location: [facility] ENDO SUITE;  Service: Endoscopy;  Laterality: N/A;  1:15/PATIENT HAS A DEFIBRILLATOR  . Cystoscopy w/ ureteral stent placement Right 09/06/2013    Procedure: CYSTOSCOPY WITH RIGHT RETROGRADE PYELOGRAM; RIGHT URETERAL STENT PLACEMENT;  Surgeon: [name], MD;  Location: [facility] ORS;  Service: Urology;  Laterality: Right;    Carlis Stable MS, [place], [place] Pager [phone] Weekend/After Hours Pager

## 2014-06-06 NOTE — Progress Notes (Signed)
ANTICOAGULATION CONSULT NOTE - Initial Consult  Pharmacy Consult for heparin Indication: atrial flutter  Allergies  Allergen Reactions  . Peanut-Containing Drug Products Anaphylaxis  . Ace Inhibitors Swelling  . Morphine Nausea And Vomiting  . Demerol Rash    Patient Measurements: Height: 5\' 4"  (162.6 cm) Weight: 171 lb 4.8 oz (77.7 kg) IBW/kg (Calculated) : 54.7 Heparin Dosing Weight: 77kg  Vital Signs: Temp: 98.7 F (37.1 C) (08/11 0800) Temp src: Oral (08/11 0800) BP: 131/106 mmHg (08/11 0800) Pulse Rate: 110 (08/11 0400)  Labs:  Recent Labs  06/04/14 0438 06/05/14 0500 06/05/14 0803 06/06/14 0442  HGB 8.0*  --  14.1  --   HCT 23.9*  --  41.9  --   PLT 121*  --  223  --   CREATININE 1.14* 1.07  --  0.99    Estimated Creatinine Clearance: 67.1 ml/min (by C-G formula based on Cr of 0.99).   Medical History: Past Medical History  Diagnosis Date  . Nonischemic cardiomyopathy     LVEF 5-10%, likely viral (no CAD by cath 01/30/11)  . Essential hypertension, benign   . NSVT (nonsustained ventricular tachycardia)   . Obesity   . Hydronephrosis with renal and ureteral calculus obstruction 09/05/2013  . Chronic systolic heart failure   . History of medication noncompliance     Assessment: 53 year old female with new aflutter this morning. Patient may need cardioversion, will start IV amio load and heparin in preparation. Not on anticoagulation prior to admission. Inotropes likely contributing to arrhythmias but deemed necessary to continue by cardiology.   CBC has been stable for the exception of some aberrant labs on 8/9.  Goal of Therapy:  Heparin level 0.3-0.7 units/ml Monitor platelets by anticoagulation protocol: Yes   Plan:  Give 4000 units bolus x 1 Start heparin infusion at 1100 units/hr Check anti-Xa level in 6 hours and daily while on heparin Continue to monitor H&H and platelets  Erin Hearing PharmD., BCPS Clinical Pharmacist Pager  [phone] 06/06/2014 12:19 PM

## 2014-06-07 LAB — IRON AND TIBC
IRON: 72 ug/dL (ref 42–135)
Saturation Ratios: 20 % (ref 20–55)
TIBC: 368 ug/dL (ref 250–470)
UIBC: 296 ug/dL (ref 125–400)

## 2014-06-07 LAB — MAGNESIUM: Magnesium: 1.9 mg/dL (ref 1.5–2.5)

## 2014-06-07 LAB — HEPATITIS B SURFACE ANTIBODY,QUALITATIVE: Hep B S Ab: POSITIVE — AB

## 2014-06-07 LAB — CARBOXYHEMOGLOBIN
CARBOXYHEMOGLOBIN: 1.1 % (ref 0.5–1.5)
Methemoglobin: 0.7 % (ref 0.0–1.5)
O2 Saturation: 63.7 %
Total hemoglobin: 13.5 g/dL (ref 12.0–16.0)

## 2014-06-07 LAB — ANTITHROMBIN III: AntiThromb III Func: 91 % (ref 75–120)

## 2014-06-07 LAB — CBC
HCT: 41.9 % (ref 36.0–46.0)
Hemoglobin: 14 g/dL (ref 12.0–15.0)
MCH: 31.2 pg (ref 26.0–34.0)
MCHC: 33.4 g/dL (ref 30.0–36.0)
MCV: 93.3 fL (ref 78.0–100.0)
PLATELETS: 230 10*3/uL (ref 150–400)
RBC: 4.49 MIL/uL (ref 3.87–5.11)
RDW: 15.3 % (ref 11.5–15.5)
WBC: 7.5 10*3/uL (ref 4.0–10.5)

## 2014-06-07 LAB — HEPATITIS C ANTIBODY: HCV AB: NEGATIVE

## 2014-06-07 LAB — URIC ACID: URIC ACID, SERUM: 6.1 mg/dL (ref 2.4–7.0)

## 2014-06-07 LAB — HEMOGLOBIN A1C
Hgb A1c MFr Bld: 6.5 % — ABNORMAL HIGH (ref <5.7)
Mean Plasma Glucose: 140 mg/dL — ABNORMAL HIGH (ref <117)

## 2014-06-07 LAB — AMYLASE: Amylase: 109 U/L — ABNORMAL HIGH (ref 0–105)

## 2014-06-07 LAB — HEPARIN LEVEL (UNFRACTIONATED): HEPARIN UNFRACTIONATED: 0.37 [IU]/mL (ref 0.30–0.70)

## 2014-06-07 LAB — LIPASE, BLOOD: Lipase: 52 U/L (ref 11–59)

## 2014-06-07 LAB — PROTIME-INR
INR: 1.16 (ref 0.00–1.49)
PROTHROMBIN TIME: 14.8 s (ref 11.6–15.2)

## 2014-06-07 LAB — HIV ANTIBODY (ROUTINE TESTING W REFLEX): HIV 1&2 Ab, 4th Generation: NONREACTIVE

## 2014-06-07 LAB — HEPATITIS B CORE ANTIBODY, IGM: Hep B C IgM: NONREACTIVE

## 2014-06-07 LAB — PREALBUMIN: PREALBUMIN: 20.9 mg/dL (ref 17.0–34.0)

## 2014-06-07 LAB — HEPATITIS B SURFACE ANTIGEN: Hepatitis B Surface Ag: NEGATIVE

## 2014-06-07 LAB — LACTATE DEHYDROGENASE: LDH: 277 U/L — AB (ref 94–250)

## 2014-06-07 MED ORDER — APIXABAN 5 MG PO TABS: 5.0000 mg | ORAL_TABLET | Freq: Two times a day (BID) | ORAL | Status: DC

## 2014-06-07 MED ORDER — AMIODARONE HCL 200 MG PO TABS
200.0000 mg | ORAL_TABLET | Freq: Every day | ORAL | Status: DC
  Administered 2014-06-07: 200 mg via ORAL
  Filled 2014-06-07: qty 1

## 2014-06-07 MED ORDER — POTASSIUM CHLORIDE CRYS ER 20 MEQ PO TBCR: 40.0000 meq | EXTENDED_RELEASE_TABLET | Freq: Every day | ORAL | Status: DC

## 2014-06-07 MED ORDER — SPIRONOLACTONE 25 MG PO TABS: 25.0000 mg | ORAL_TABLET | Freq: Every day | ORAL | Status: DC

## 2014-06-07 MED ORDER — HEPARIN SOD (PORK) LOCK FLUSH 100 UNIT/ML IV SOLN
250.0000 [IU] | Freq: Every day | INTRAVENOUS | Status: DC
  Filled 2014-06-07: qty 3

## 2014-06-07 MED ORDER — APIXABAN 5 MG PO TABS
5.0000 mg | ORAL_TABLET | Freq: Two times a day (BID) | ORAL | Status: DC
  Administered 2014-06-07: 5 mg via ORAL
  Filled 2014-06-07 (×2): qty 1

## 2014-06-07 MED ORDER — MILRINONE IN DEXTROSE 20 MG/100ML IV SOLN: 0.2500 ug/kg/min | INTRAVENOUS | Status: DC

## 2014-06-07 MED ORDER — HEPARIN SOD (PORK) LOCK FLUSH 100 UNIT/ML IV SOLN
250.0000 [IU] | INTRAVENOUS | Status: DC | PRN
  Administered 2014-06-07: 250 [IU]
  Filled 2014-06-07: qty 3

## 2014-06-07 MED ORDER — AMIODARONE HCL 200 MG PO TABS: 200.0000 mg | ORAL_TABLET | Freq: Two times a day (BID) | ORAL | Status: DC

## 2014-06-07 MED ORDER — AMIODARONE LOAD VIA INFUSION
150.0000 mg | Freq: Once | INTRAVENOUS | Status: DC
  Filled 2014-06-07: qty 83.34

## 2014-06-07 MED ORDER — FUROSEMIDE 40 MG PO TABS: 40.0000 mg | ORAL_TABLET | Freq: Two times a day (BID) | ORAL | Status: DC

## 2014-06-07 MED ORDER — HYDRALAZINE HCL 25 MG PO TABS: 25.0000 mg | ORAL_TABLET | Freq: Three times a day (TID) | ORAL | Status: DC

## 2014-06-07 MED ORDER — ISOSORBIDE MONONITRATE ER 30 MG PO TB24: 30.0000 mg | ORAL_TABLET | Freq: Every day | ORAL | Status: DC

## 2014-06-07 NOTE — Progress Notes (Signed)
I followed up with Anita Warren today and she is feeling much better but didn't get much sleep last night. She is happy that she has wound up here under care of the CHF team and is happy that she may have some options. She is a very appreciative person. She also tells me she is anxious to get home. No further questions/concerns. She is happy with her decision to follow cardiac transplant path and has no concerns and is comfortable with this decision.   Vinie Sill, NP Palliative Medicine Team Pager # [phone] (M-F 8a-5p) Team Phone # [phone] (Nights/Weekends)

## 2014-06-07 NOTE — Progress Notes (Signed)
[place] for Eliquis Indication: atrial flutter  Allergies  Allergen Reactions  . Peanut-Containing Drug Products Anaphylaxis  . Ace Inhibitors Swelling  . Morphine Nausea And Vomiting  . Demerol Rash    Patient Measurements: Height: 5\' 4"  (162.6 cm) Weight: 170 lb 3.1 oz (77.2 kg) IBW/kg (Calculated) : 54.7 Heparin Dosing Weight: 77kg  Vital Signs: Temp: 98.3 F (36.8 C) (08/12 0417) Temp src: Oral (08/12 0417) BP: 138/100 mmHg (08/12 0617)  Labs:  Recent Labs  06/05/14 0500  06/05/14 0803 06/06/14 0442 06/06/14 1812 06/07/14 0310  HGB  --   < > 14.1  --  14.3 14.0  HCT  --   --  41.9  --  41.6 41.9  PLT  --   --  223  --  225 230  LABPROT  --   --   --   --   --  14.8  INR  --   --   --   --   --  1.16  HEPARINUNFRC  --   --   --   --  0.23* 0.37  CREATININE 1.07  --   --  0.99  --   --   < > = values in this interval not displayed.  Estimated Creatinine Clearance: 66.8 ml/min (by C-G formula based on Cr of 0.99).  Assessment: 53 year old female with atrial flutter to change from heparin to [place]:  D/C heparin now Eliquis 5 mg po BID  Phillis Knack, PharmD, BCPS

## 2014-06-07 NOTE — Discharge Summary (Signed)
Advanced Heart Failure Team  Discharge Summary   Patient ID: Anita Warren MRN: [id number], DOB/AGE: Jun 18, 1961 53 y.o. Admit date: 06/02/2014 D/C date:     06/07/2014   Primary Discharge Diagnoses:  1) Cardiogenic shock 2) A/c systolic HF 3) Non-ischemic cardiomyopathy    --EF 5-10% by echo 7/15 4) Paroxysmal atrial flutter 5) Acute kidney failure  Hospital Course:   Anita Warren is a 53 y.o.female with history of viral, non-ischemic cardiomyopathy, severe systolic dysfunction with EF of 10% s/p ICD pacemaker in the setting of VT 02/2011 ([employer] VR model CD1231-40Q (serial [id number]); Most recent cath 2012 with no CAD.   Admitted July 28th with acute on chronic systolic CHF in the setting of medical non-compliance, not taking medications for 4 months because she lost her insurance. She was diuresed with IV lasix and transitioned to lasix 40 mg daily and discharged. Present to HF Clinic on 06/02/2014 with Class IV HF with low output symptoms.   PA catheter placed on 06/02/2014 which revealed cardiogenic shock.  RA 25  PAP 47/34  PCWP 35  Thermo 2.0/1.1  SVR 3094  RA/P/CVP 0 .71 Co-ox 52%  Started on milrinone and IV lasix with marked improvement. Diuresed down to 170 pounds. Creatinine improved from 1.4 -> 0.99.  Home milrinone arranged. Lasix switched to po. Started hydralazine/nitrates. No ACE due to initial renal failure. No b-blocker due to shock.   Seen by Dr. Prescott Gum as part of work-up for advanced therapies. She is blood type B+. Initially against organ transplant due to her religion [facility]) but willing to consider. Pending Medicaid approval. On 06/06/2014 developed AFL. Treated with amiodarone and converted to NSR. Started apixaban. Extensive HF teaching provided.    Discharge Weight Range: 170 pounds Discharge Vitals: Blood pressure 142/102, pulse 108, temperature 97.8 F (36.6 C), temperature source Oral, resp. rate 22, height 5\' 4"  (1.626 m),  weight 170 lb 3.1 oz (77.2 kg), last menstrual period 10/28/1999, SpO2 99.00%.  Labs: Lab Results  Component Value Date   WBC 7.5 06/07/2014   HGB 14.0 06/07/2014   HCT 41.9 06/07/2014   MCV 93.3 06/07/2014   PLT 230 06/07/2014    Recent Labs Lab 06/02/14 1358  06/06/14 0442  NA 139  < > 138  K 5.1  < > 4.3  CL 100  < > 103  CO2 23  < > 23  BUN 26*  < > 18  CREATININE 1.44*  < > 0.99  CALCIUM 9.3  < > 8.8  PROT 7.1  --   --   BILITOT 1.2  --   --   ALKPHOS 52  --   --   ALT 27  --   --   AST 18  --   --   GLUCOSE 94  < > 100*  < > = values in this interval not displayed. Lab Results  Component Value Date   CHOL 140 10/31/2013   HDL 31* 10/31/2013   LDLCALC 91 10/31/2013   TRIG 90 10/31/2013   BNP (last 3 results)  Recent Labs  05/22/14 2340 06/02/14 1230 06/05/14 0830  PROBNP 13927.0* 10615.0* 2867.0*    Diagnostic Studies/Procedures   Ct Chest Wo Contrast  06/06/2014   CLINICAL DATA:  Pre ventricular assist device evaluation.  EXAM: CT CHEST WITHOUT CONTRAST  TECHNIQUE: Multidetector CT imaging of the chest was performed following the standard protocol without IV contrast.  COMPARISON:  Chest radiograph 06/03/2014. Abdominal pelvic CT of 09/03/2013. No  prior chest CT.  FINDINGS: Lungs/Pleura:  Mild beam hardening artifact from pacer/AICD device.  Right basilar pulmonary opacities which measure up to 2.4 cm. Favored to represent airspace disease, given surrounding ground-glass opacity on sagittal image 25.  No pleural fluid.  Heart/Mediastinum: A right-sided PICC line which terminates at the low SVC. Pacer/AICD device with lead at terminating at right ventricle.  Marked cardiomegaly with small pericardial effusion. No mediastinal or definite hilar adenopathy, given limitations of unenhanced CT. Pulmonary artery enlargement, with the outflow tract measuring 3.5 cm.  Upper Abdomen: Normal imaged portions of the liver, spleen, stomach, adrenal glands, kidneys.  Bones/Musculoskeletal:  No acute osseous abnormality. A lipoma within the left axilla measures 5.7 cm.  IMPRESSION: 1. Cardiomegaly with small pericardial effusion. 2. Right base opacities which are favored to represent multi focal mild airspace disease. Favor infection. Consider antibiotic therapy and short term CT followup at 4-6 weeks. These results will be called to the ordering clinician or representative by the Radiologist Assistant, and communication documented in the PACS or zVision Dashboard. 3. Pulmonary artery enlargement suggests pulmonary arterial hypertension.   Electronically Signed   By: Abigail Miyamoto M.D.   On: 06/06/2014 17:52    Discharge Medications     Medication List    STOP taking these medications       carvedilol 12.5 MG tablet  Commonly known as:  COREG     isosorbide dinitrate 30 MG tablet  Commonly known as:  ISORDIL      TAKE these medications       acetaminophen 500 MG tablet  Commonly known as:  TYLENOL  Take 1,000 mg by mouth every 6 (six) hours as needed for moderate pain.     amiodarone 200 MG tablet  Commonly known as:  PACERONE  Take 1 tablet (200 mg total) by mouth 2 (two) times daily.     apixaban 5 MG Tabs tablet  Commonly known as:  ELIQUIS  Take 1 tablet (5 mg total) by mouth 2 (two) times daily.     aspirin EC 81 MG tablet  Take 81 mg by mouth daily.     aspirin-acetaminophen-caffeine 734-193-79 MG per tablet  Commonly known as:  EXCEDRIN MIGRAINE  Take 1 tablet by mouth every 6 (six) hours as needed for headache or migraine.     Co Q 10 100 MG Caps  Take 100 mg by mouth daily.     furosemide 40 MG tablet  Commonly known as:  LASIX  Take 1 tablet (40 mg total) by mouth 2 (two) times daily.     hydrALAZINE 25 MG tablet  Commonly known as:  APRESOLINE  Take 1 tablet (25 mg total) by mouth 3 (three) times daily.     isosorbide mononitrate 30 MG 24 hr tablet  Commonly known as:  IMDUR  Take 1 tablet (30 mg total) by mouth daily.     milrinone 20  MG/100ML Soln infusion  Commonly known as:  PRIMACOR  Inject 20.05 mcg/min into the vein continuous.     potassium chloride SA 20 MEQ tablet  Commonly known as:  K-DUR,KLOR-CON  Take 2 tablets (40 mEq total) by mouth daily.     spironolactone 25 MG tablet  Commonly known as:  ALDACTONE  Take 1 tablet (25 mg total) by mouth daily.        Disposition   The patient will be discharged in stable condition to home. Discharge Instructions   Contraindication beta blocker-discharge    Complete by:  As directed   Low output     Contraindication to ACEI at discharge    Complete by:  As directed   Angioedema     Diet - low sodium heart healthy    Complete by:  As directed      Discharge instructions    Complete by:  As directed   1) Please bring all your medications to your next visit.  2) Call any issues [phone]     Heart Failure patients record your daily weight using the same scale at the same time of day    Complete by:  As directed      Increase activity slowly    Complete by:  As directed      STOP any activity that causes chest pain, shortness of breath, dizziness, sweating, or exessive weakness    Complete by:  As directed           Follow-up Information   Follow up with [facility] On 06/14/2014. (@ 2:15 pm; gate code [id number]. Please bring all your medications to your visit. )    Specialty:  Cardiology   Contact information:   [address] [phone] [place] [address] [phone]        Duration of Discharge Encounter: Greater than 35 minutes   Signed, Benay Spice 5:01 PM

## 2014-06-07 NOTE — Progress Notes (Addendum)
Patient ID: JEROLINE WOLBERT, female   DOB: 06/18/1961, 53 y.o.   MRN: [id number]   SUBJECTIVE:   Went into AFL yesterday. Started amio and heparin.   Feels great. HR remains in low 100s.  Denies SOB/Orthopnea.   Waiting on home milrinone with approval.   Scheduled Meds: . feeding supplement (RESOURCE BREEZE)  1 Container Oral BID BM  . furosemide  40 mg Oral BID  . hydrALAZINE  25 mg Oral 3 times per day  . isosorbide mononitrate  30 mg Oral Daily  . potassium chloride  40 mEq Oral Daily  . sodium chloride  10-40 mL Intracatheter Q12H  . spironolactone  25 mg Oral Daily   Continuous Infusions: . sodium chloride Stopped (06/04/14 1238)  . amiodarone 30 mg/hr (06/06/14 2153)  . heparin 1,250 Units/hr (06/07/14 0618)  . milrinone 0.25 mcg/kg/min (06/07/14 0618)   PRN Meds:.sodium chloride, acetaminophen, ondansetron (ZOFRAN) IV, sodium chloride    Filed Vitals:   06/06/14 2153 06/06/14 2335 06/07/14 0417 06/07/14 0617  BP: 131/97 116/87  138/100  Pulse:      Temp:  98.5 F (36.9 C) 98.3 F (36.8 C)   TempSrc:  Oral Oral   Resp:  19    Height:      Weight:   77.2 kg (170 lb 3.1 oz)   SpO2:  96% 98%     Intake/Output Summary (Last 24 hours) at 06/07/14 0628 Last data filed at 06/07/14 0618  Gross per 24 hour  Intake 2377.84 ml  Output   2900 ml  Net -522.16 ml    LABS: Basic Metabolic Panel:  Recent Labs  06/05/14 0500 06/06/14 0442 06/07/14 0310  NA 136* 138  --   K 3.5* 4.3  --   CL 101 103  --   CO2 23 23  --   GLUCOSE 90 100*  --   BUN 15 18  --   CREATININE 1.07 0.99  --   CALCIUM 8.6 8.8  --   MG  --   --  1.9   Liver Function Tests: No results found for this basename: AST, ALT, ALKPHOS, BILITOT, PROT, ALBUMIN,  in the last 72 hours  Recent Labs  06/07/14 0310  LIPASE 52  AMYLASE 109*   CBC:  Recent Labs  06/06/14 1812 06/07/14 0310  WBC 7.5 7.5  HGB 14.3 14.0  HCT 41.6 41.9  MCV 92.2 93.3  PLT 225 230   Cardiac  Enzymes: No results found for this basename: CKTOTAL, CKMB, CKMBINDEX, TROPONINI,  in the last 72 hours BNP: No components found with this basename: POCBNP,  D-Dimer: No results found for this basename: DDIMER,  in the last 72 hours Hemoglobin A1C: No results found for this basename: HGBA1C,  in the last 72 hours Fasting Lipid Panel: No results found for this basename: CHOL, HDL, LDLCALC, TRIG, CHOLHDL, LDLDIRECT,  in the last 72 hours Thyroid Function Tests: No results found for this basename: TSH, T4TOTAL, FREET3, T3FREE, THYROIDAB,  in the last 72 hours Anemia Panel: No results found for this basename: VITAMINB12, FOLATE, FERRITIN, TIBC, IRON, RETICCTPCT,  in the last 72 hours  RADIOLOGY: Dg Chest 2 View  05/23/2014   CLINICAL DATA:  Cough and shortness of breath for 4 weeks. History of cardiomyopathy.  EXAM: CHEST  2 VIEW  COMPARISON:  10/28/2013  FINDINGS: Cardiac pacemaker. Diffuse cardiac enlargement without vascular congestion, similar to prior study. No focal airspace disease or consolidation in the lungs. No blunting of costophrenic angles.  No pneumothorax.  IMPRESSION: Cardiac enlargement.  No evidence of active pulmonary disease.   Electronically Signed   By: Lucienne Capers M.D.   On: 05/23/2014 01:08   Dg Chest Port 1 View  06/02/2014   CLINICAL DATA:  Central catheter placement  EXAM: PORTABLE CHEST - 1 VIEW  COMPARISON:  May 23, 2014  FINDINGS: Central catheter tip is in the superior vena cava. There is a Swan-Ganz catheter with the tip in the distal right interlobar pulmonary artery. No pneumothorax. There is atelectatic change in the left lower lobe. There is a questionable small left effusion. Lungs are otherwise clear. There is generalized cardiac enlargement with pulmonary vascularity within normal limits. Pacemaker lead position unchanged. No adenopathy.  IMPRESSION: Swan-Ganz catheter tip is in the distal right interlobar pulmonary artery. Central catheter tip in superior  vena cava. No pneumothorax. Left lower lobe atelectatic change. Probable small pleural effusion on the left. Stable cardiomegaly.   Electronically Signed   By: Lowella Grip M.D.   On: 06/02/2014 17:32    PHYSICAL EXAM- General: NAD Neck: RIJ swan JVP 7-8, no thyromegaly or thyroid nodule.  Lungs: Clear to auscultation bilaterally with normal respiratory effort. CV: Lateral PMI.  Heart regular S1/S2, +S3, no murmur.  No peripheral edema.   Abdomen: Soft, nontender, no hepatosplenomegaly, no distention.  Neurologic: Alert and oriented x 3.  Psych: Normal affect. Extremities: No clubbing or cyanosis. RUE PICC  TELEMETRY: Tachycardiac 100-110s  ASSESSMENT AND PLAN: Mrs Darrick Meigs is a 53 year old with a history of chronic systolic heart failure, EF 5-10% by 7/15 echo, nonischemic cardiomyopathy thought to be from viral cardiomyopathy noted in 2011 admitted from HF clinic acutely decompensated with NHYA class IV symptoms.  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  She is doing much better on milrinone gtt.  1) Cardiogenic shock 2) A/C systolic HD due to NICM EF 5-10%     --RV moderatelty HK 3) AFL  She feels well this am but still slightly tachycardic. I suspect she is still in AFL but it is hard to tell. Will repeat ECG. Continue amio. If still in still in flutter plan DC-CV tomorrow. Will switch to Eliquis 5 bid today if CM thinks we can get it as outpatient.    Still working to get milrinone to facilitate discharge.  Appreciate Dr. Lucianne Lei Trigt's consult. Transplant referral pending.   Travion Ke,MD 6:28 AM  Addendum: ECG reviewed. Now in NSR.   Benay Spice 6:47 AM

## 2014-06-08 LAB — HEPARIN INDUCED THROMBOCYTOPENIA PNL
Heparin Induced Plt Ab: NEGATIVE
PATIENT O. D.: 0.074
UFH High Dose UFH H: 1 % Release
UFH Low Dose 0.1 IU/mL: 0 % Release
UFH Low Dose 0.5 IU/mL: 0 % Release
UFH SRA RESULT: NEGATIVE

## 2014-06-08 LAB — LUPUS ANTICOAGULANT PANEL
DRVVT: 36.9 s (ref <42.9)
LUPUS ANTICOAGULANT: NOT DETECTED
PTT Lupus Anticoagulant: 63.4 secs — ABNORMAL HIGH (ref 28.0–43.0)
PTTLA 4:1 Mix: 64.3 secs — ABNORMAL HIGH (ref 28.0–43.0)
PTTLA Confirmation: 0.5 secs (ref <8.0)

## 2014-06-09 LAB — FACTOR 5 LEIDEN

## 2014-06-14 ENCOUNTER — Ambulatory Visit (HOSPITAL_COMMUNITY)
Admit: 2014-06-14 | Discharge: 2014-06-14 | Disposition: A | Discharge status: Home / Self Care | Payer: Medicaid Other | Source: Ambulatory Visit | Attending: Internal Medicine | Admitting: Internal Medicine

## 2014-06-14 ENCOUNTER — Encounter: Payer: Self-pay | Admitting: Licensed Clinical Social Worker

## 2014-06-14 VITALS — BP 124/86 | HR 110 | Wt 165.0 lb

## 2014-06-14 DIAGNOSIS — N1330 Unspecified hydronephrosis: Secondary | ICD-10-CM | POA: Insufficient documentation

## 2014-06-14 DIAGNOSIS — I4892 Unspecified atrial flutter: Secondary | ICD-10-CM

## 2014-06-14 DIAGNOSIS — I4891 Unspecified atrial fibrillation: Secondary | ICD-10-CM | POA: Insufficient documentation

## 2014-06-14 DIAGNOSIS — N201 Calculus of ureter: Secondary | ICD-10-CM | POA: Insufficient documentation

## 2014-06-14 DIAGNOSIS — Z95810 Presence of automatic (implantable) cardiac defibrillator: Secondary | ICD-10-CM | POA: Insufficient documentation

## 2014-06-14 DIAGNOSIS — Z91199 Patient's noncompliance with other medical treatment and regimen due to unspecified reason: Secondary | ICD-10-CM | POA: Diagnosis not present

## 2014-06-14 DIAGNOSIS — E669 Obesity, unspecified: Secondary | ICD-10-CM | POA: Insufficient documentation

## 2014-06-14 DIAGNOSIS — I509 Heart failure, unspecified: Secondary | ICD-10-CM | POA: Insufficient documentation

## 2014-06-14 DIAGNOSIS — I5022 Chronic systolic (congestive) heart failure: Secondary | ICD-10-CM

## 2014-06-14 DIAGNOSIS — Z7982 Long term (current) use of aspirin: Secondary | ICD-10-CM | POA: Insufficient documentation

## 2014-06-14 DIAGNOSIS — I428 Other cardiomyopathies: Secondary | ICD-10-CM | POA: Diagnosis not present

## 2014-06-14 DIAGNOSIS — I498 Other specified cardiac arrhythmias: Secondary | ICD-10-CM | POA: Diagnosis not present

## 2014-06-14 DIAGNOSIS — Z9119 Patient's noncompliance with other medical treatment and regimen: Secondary | ICD-10-CM | POA: Diagnosis not present

## 2014-06-14 DIAGNOSIS — I10 Essential (primary) hypertension: Secondary | ICD-10-CM | POA: Diagnosis not present

## 2014-06-14 MED ORDER — AMIODARONE HCL 200 MG PO TABS: 200.0000 mg | ORAL_TABLET | Freq: Every day | ORAL | Status: DC

## 2014-06-14 NOTE — Patient Instructions (Signed)
Follow up in 2 weeks  Take amiodarone 200 mg once a day  Stop baby aspirin  Do the following things EVERYDAY: 1) Weigh yourself in the morning before breakfast. Write it down and keep it in a log. 2) Take your medicines as prescribed 3) Eat low salt foods-Limit salt (sodium) to 2000 mg per day.  4) Stay as active as you can everyday 5) Limit all fluids for the day to less than 2 liters

## 2014-06-14 NOTE — Progress Notes (Signed)
Patient ID: Anita Warren, female   DOB: August 26, 1961, 53 y.o.   MRN: [id number]  Primary Physician: Glo Herring., MD  Primary Cardiologist: Carlyle Dolly MD [facility])   HPI: Anita Warren is a 53 y.o.female with history of viral, non-ischemic cardiomyopathy, severe systolic dysfunction with EF of 10% s/p ICD pacemaker in the setting of VT 02/2011 ([place] VR model CD1231-40Q (serial [phone]);  Most recent cath 2012 with no CAD.   Admitted July 28 th with acute on chronic systolic CHF in the setting of medical non-compliance, not taking medications for 4 months because she lost her insurance. She was diuresed with IV lasix and transitioned to lasix 40 mg dialy. She was also discharged on 12.5 mg carvedilol twice a day, hydralaizine 25 mg tid, and 30 mg Imdur daily. Discharge weight was 176 pounds. She was not on  an Ace due to angioedema.  Admitted from HF clinic 8/715 with low output heart failure. Swan placed and showed cardiogenic shock. Ultimately discharged on  Milrinone 0.25 mcg . Hospital stay was complicated by A flutter. Loaded on amiodarone and started on eliquis 5 mg twice a day. Met with Dr Darcey Nora and VAD coordinator.   She returns for post hospital follow up. Feels great. Denies SOB/PND/Orthopnea. Weight at home 166 pounds. No bleeding problems. Taking all medications. [facility] following for home milrinone and weekly BMET. Disability and Medicaid in pending.   05/23/14 ECHO EF 5-10% RV mod to severely dilated.   Labs:  HIV negative 2015   2013 Colonoscopy Blood Type:  B+  Lower Extremity Doppler - Normal arterial flow Carotid Doppler-  -39 percent stenosis involving the right internal carotid artery and the left internal carotid Artery.  SH: Disabled  Lives with husband and son. Has 3 grown children. Does not smoke or drink alcohol.. 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   FH: Mom died at 99 CAD         Sister and Brother HTN    ROS: All systems negative  except as listed in HPI, PMH and Problem List.  Past Medical History  Diagnosis Date  . Nonischemic cardiomyopathy     LVEF 5-10%, likely viral (no CAD by cath 01/30/11)  . Essential hypertension, benign   . NSVT (nonsustained ventricular tachycardia)   . Obesity   . Hydronephrosis with renal and ureteral calculus obstruction 09/05/2013  . Chronic systolic heart failure   . History of medication noncompliance     Current Outpatient Prescriptions  Medication Sig Dispense Refill  . acetaminophen (TYLENOL) 500 MG tablet Take 1,000 mg by mouth every 6 (six) hours as needed for moderate pain.      Marland Kitchen amiodarone (PACERONE) 200 MG tablet Take 1 tablet (200 mg total) by mouth 2 (two) times daily.  60 tablet  3  . apixaban (ELIQUIS) 5 MG TABS tablet Take 1 tablet (5 mg total) by mouth 2 (two) times daily.  60 tablet  3  . aspirin EC 81 MG tablet Take 81 mg by mouth daily.      . Coenzyme Q10 (CO Q 10) 100 MG CAPS Take 100 mg by mouth daily.       . furosemide (LASIX) 40 MG tablet Take 1 tablet (40 mg total) by mouth 2 (two) times daily.  60 tablet  3  . hydrALAZINE (APRESOLINE) 25 MG tablet Take 1 tablet (25 mg total) by mouth 3 (three) times daily.  90 tablet  3  . isosorbide mononitrate (IMDUR) 30 MG 24 hr  tablet Take 1 tablet (30 mg total) by mouth daily.  30 tablet  3  . milrinone (PRIMACOR) 20 MG/100ML SOLN infusion Inject 20.05 mcg/min into the vein continuous.  100 mL  1  . potassium chloride SA (K-DUR,KLOR-CON) 20 MEQ tablet Take 2 tablets (40 mEq total) by mouth daily.  60 tablet  3  . spironolactone (ALDACTONE) 25 MG tablet Take 1 tablet (25 mg total) by mouth daily.  30 tablet  3   No current facility-administered medications for this encounter.     PHYSICAL EXAM: Filed Vitals:   06/14/14 1359  BP: 124/86  Pulse: 110  Weight: 165 lb (74.844 kg)  SpO2: 98%    General:  NAD   HEENT: normal Neck: supple. JVP 5-6 . Carotids 2+ bilaterally; no bruits. No lymphadenopathy or  thryomegaly appreciated. Cor: PMI normal. Regular rate & rhythm. No rubs, or murmurs. + S3  Lungs: clear Abdomen: soft, nontender, nondistended. No hepatosplenomegaly. No bruits or masses. Good bowel sounds. Extremities: no cyanosis, clubbing, rash. Lower extremities cool. R and LLE 1+ edema. RUE PICC  Neuro: alert & orientedx3, cranial nerves grossly intact. Moves all 4 extremities w/o difficulty. Affect pleasant.  EKG: Sinus Tach 113 bpm     ASSESSMENT & PLAN: 1. Chronic Systolic Heart Failure NICM thought to be viral cardiomyopathy noted in 2011. Cath 2012 norm cors ECHO July 28 with EF 5-10% and RV mod-severly dilated.  Recently admitted with cardiogenic shock. On Home Milrinone 0.25 mcg via PICC. Currently being worked up for advanced therapies.   Completed Pre LVAD tests Blood Type- B+  Lower Extremity Doppler - Normal arterial flow Carotid Doppler-  -39 percent stenosis involving the right internal carotid artery and the left internal carotid Artery. Had colonoscopy in 2013. CT on chest-06/06/2014 - ? R base opacities? Infections. Needs repeat 5 weeks.   PFTs: pending  Today she is NYHA II on Milrinone at 0.25 mcg via PICC. Volume status stable. Continue lasix 40 mg twice a day.  No bb due to low output or Ace due angioedema.  - Continue  hydralazine 25 mg tid/imdur 30 mg daily  Plan to repeat ECHO in next few months.  - Completed HF SW evaluation today as part of VAD work up. She has advanced directives information. She is very interested in advanced therapies to include VAD versus transplant.  - Medicaid pending.  Reinforced daily weights, low salt diet and limiting fluid intake to < 2 liters per day.  Followed by [facility].Weekly BMET. Had lab work done earlier today.   2. PAF.  Back in Sinus Tach. Cut back amiodarone to 200 mg daily. No bleeding problems on eliquis 5 mg twice a day. Check TSH next blood draw.   Follow up in 2 weeks with Dr Haroldine Laws.  Need to set up CPX. Will  also need to set up repeat CT of chest at that time.    CLEGG,AMY NP-C  2:12 PM

## 2014-06-15 NOTE — Addendum Note (Signed)
Encounter addended by: Bonne Dolores, NT on: 06/15/2014  8:16 AM<BR>     Documentation filed: Charges VN

## 2014-06-19 ENCOUNTER — Telehealth (HOSPITAL_COMMUNITY): Payer: Self-pay | Admitting: Vascular Surgery

## 2014-06-19 NOTE — Telephone Encounter (Signed)
Nurse from [facility].. Home bp alarm 118/90 .Marland Kitchen 125/91 on th  machine.. Last 3 days 120/98 .Marland Kitchen She is taking her medicine correctly.. She has had her medicine this morning but still running high.. Please advise

## 2014-06-20 ENCOUNTER — Telehealth (HOSPITAL_COMMUNITY): Payer: Self-pay

## 2014-06-20 NOTE — Telephone Encounter (Signed)
Patient confirmed she received message.  Aware and agreeable.

## 2014-06-20 NOTE — Telephone Encounter (Signed)
Left message instructing patient to hold lasix x 2 days, then decrease dosage to 40mg  once daily.  Asked to call us back to verify she received message.  Will attempt to recall if call not returned. Anita Warren

## 2014-06-22 ENCOUNTER — Encounter (HOSPITAL_COMMUNITY): Payer: Self-pay | Admitting: Emergency Medicine

## 2014-06-22 ENCOUNTER — Emergency Department (HOSPITAL_COMMUNITY)
Admission: EM | Admit: 2014-06-22 | Discharge: 2014-06-22 | Disposition: A | Discharge status: Home / Self Care | Payer: Medicaid Other | Attending: Emergency Medicine | Admitting: Emergency Medicine

## 2014-06-22 DIAGNOSIS — I10 Essential (primary) hypertension: Secondary | ICD-10-CM | POA: Diagnosis not present

## 2014-06-22 DIAGNOSIS — E669 Obesity, unspecified: Secondary | ICD-10-CM | POA: Insufficient documentation

## 2014-06-22 DIAGNOSIS — Z9119 Patient's noncompliance with other medical treatment and regimen: Secondary | ICD-10-CM | POA: Diagnosis not present

## 2014-06-22 DIAGNOSIS — Z79899 Other long term (current) drug therapy: Secondary | ICD-10-CM | POA: Insufficient documentation

## 2014-06-22 DIAGNOSIS — I5022 Chronic systolic (congestive) heart failure: Secondary | ICD-10-CM | POA: Diagnosis not present

## 2014-06-22 DIAGNOSIS — Z95810 Presence of automatic (implantable) cardiac defibrillator: Secondary | ICD-10-CM | POA: Diagnosis not present

## 2014-06-22 DIAGNOSIS — I5023 Acute on chronic systolic (congestive) heart failure: Secondary | ICD-10-CM

## 2014-06-22 DIAGNOSIS — Z87442 Personal history of urinary calculi: Secondary | ICD-10-CM | POA: Insufficient documentation

## 2014-06-22 DIAGNOSIS — T783XXA Angioneurotic edema, initial encounter: Secondary | ICD-10-CM | POA: Diagnosis not present

## 2014-06-22 DIAGNOSIS — Z7901 Long term (current) use of anticoagulants: Secondary | ICD-10-CM | POA: Diagnosis not present

## 2014-06-22 DIAGNOSIS — Z91199 Patient's noncompliance with other medical treatment and regimen due to unspecified reason: Secondary | ICD-10-CM | POA: Diagnosis not present

## 2014-06-22 DIAGNOSIS — T465X5A Adverse effect of other antihypertensive drugs, initial encounter: Secondary | ICD-10-CM | POA: Diagnosis not present

## 2014-06-22 DIAGNOSIS — R0602 Shortness of breath: Secondary | ICD-10-CM

## 2014-06-22 DIAGNOSIS — Z87448 Personal history of other diseases of urinary system: Secondary | ICD-10-CM | POA: Insufficient documentation

## 2014-06-22 DIAGNOSIS — Z515 Encounter for palliative care: Secondary | ICD-10-CM

## 2014-06-22 MED ORDER — FAMOTIDINE 20 MG PO TABS
20.0000 mg | ORAL_TABLET | Freq: Every day | ORAL | Status: DC
Start: 2014-06-22 — End: 2014-07-17

## 2014-06-22 MED ORDER — PREDNISONE 20 MG PO TABS
60.0000 mg | ORAL_TABLET | Freq: Every day | ORAL | Status: DC
Start: 2014-06-22 — End: 2014-06-30

## 2014-06-22 MED ORDER — DIPHENHYDRAMINE HCL 25 MG PO CAPS: 25.0000 mg | ORAL_CAPSULE | Freq: Three times a day (TID) | ORAL | Status: DC | PRN

## 2014-06-22 MED ORDER — DIPHENHYDRAMINE HCL 25 MG PO CAPS
25.0000 mg | ORAL_CAPSULE | Freq: Once | ORAL | Status: AC
  Administered 2014-06-22: 25 mg via ORAL
  Filled 2014-06-22: qty 1

## 2014-06-22 MED ORDER — FAMOTIDINE 20 MG PO TABS
20.0000 mg | ORAL_TABLET | Freq: Once | ORAL | Status: AC
  Administered 2014-06-22: 20 mg via ORAL
  Filled 2014-06-22: qty 1

## 2014-06-22 MED ORDER — PREDNISONE 50 MG PO TABS
60.0000 mg | ORAL_TABLET | Freq: Once | ORAL | Status: AC
  Administered 2014-06-22: 60 mg via ORAL
  Filled 2014-06-22 (×2): qty 1

## 2014-06-22 NOTE — ED Notes (Signed)
Dr. Horton at bedside. 

## 2014-06-22 NOTE — Discharge Instructions (Signed)
Angioedema °Angioedema is sudden puffiness (swelling), often of the skin. It can happen: °· On your face or privates (genitals). °· In your belly (abdomen) or other body parts. °It usually happens quickly and gets better in 1 or 2 days. It often starts at night and is found when you wake up. You may get red, itchy patches of skin (hives). Attacks can be dangerous if your breathing passages get puffy. °The condition may happen only once, or it can come back at random times. It may happen for several years before it goes away for good. °HOME CARE °· Only take medicines as told by your doctor. °· Always carry your emergency allergy medicines with you. °· Wear a medical bracelet as told by your doctor. °· Avoid things that you know will cause attacks (triggers). °GET HELP IF: °· You have another attack. °· Your attacks happen more often or get worse. °· The condition was passed to you by your parents and you want to have children. °GET HELP RIGHT AWAY IF:  °· Your mouth, tongue, or lips are very puffy. °· You have trouble breathing. °· You have trouble swallowing. °· You pass out (faint). °MAKE SURE YOU:  °· Understand these instructions. °· Will watch your condition. °· Will get help right away if you are not doing well or get worse. °Document Released: 10/01/2009 Document Revised: 08/03/2013 Document Reviewed: 06/06/2013 °ExitCare® Patient Information ©2015 [employer]. This information is not intended to replace advice given to you by your health care provider. Make sure you discuss any questions you have with your health care provider. ° °

## 2014-06-22 NOTE — ED Provider Notes (Addendum)
CSN: [id number]     Arrival date & time 06/22/14  0605 History   First MD Initiated Contact with Patient 06/22/14 [phone]     Chief Complaint  Patient presents with  . Angioedema     (Consider location/radiation/quality/duration/timing/severity/associated sxs/prior Treatment) HPI  This is a 53 year old female with a history of nonischemic cardiomyopathy, systolic heart failure, hypertension who presents with tongue swelling. Patient states that she woke up this morning and noted swelling of her tongue and left face. She has a history of angioedema and associated with ACE inhibitors several years ago. She denies any new medications, new ingestions or exposures. She denies any difficulty breathing or swallowing.  She denies any recent fevers or illnesses. She denies any rash.  She did not take anything at home.  Past Medical History  Diagnosis Date  . Nonischemic cardiomyopathy     LVEF 5-10%, likely viral (no CAD by cath 01/30/11)  . Essential hypertension, benign   . NSVT (nonsustained ventricular tachycardia)   . Obesity   . Hydronephrosis with renal and ureteral calculus obstruction 09/05/2013  . Chronic systolic heart failure   . History of medication noncompliance    Past Surgical History  Procedure Laterality Date  . Tonsillectomy    . Breast lumpectomy  1989    L breast- benign  . Tubal ligation    . Cholecystectomy    . Cardiac defibrillator placement  5/12    SJM by JA  . Colonoscopy  07/02/2012    Procedure: COLONOSCOPY;  Surgeon: [name], MD;  Location: [facility] SUITE;  Service: Endoscopy;  Laterality: N/A;  1:15/PATIENT HAS A DEFIBRILLATOR  . Cystoscopy w/ ureteral stent placement Right 09/06/2013    Procedure: CYSTOSCOPY WITH RIGHT RETROGRADE PYELOGRAM; RIGHT URETERAL STENT PLACEMENT;  Surgeon: [name], MD;  Location: [facility] ORS;  Service: Urology;  Laterality: Right;   Family History  Problem Relation Age of Onset  . Hypertension    . Diabetes    . Colon  cancer Neg Hx    History  Substance Use Topics  . Smoking status: Never Smoker   . Smokeless tobacco: Never Used  . Alcohol Use: No   OB History   Grav Para Term Preterm Abortions TAB SAB Ect Mult Living                 Review of Systems  Constitutional: Negative for fever.  HENT: Negative for sore throat, trouble swallowing and voice change.        Tongue swelling  Respiratory: Negative for chest tightness and shortness of breath.   Cardiovascular: Negative for chest pain.  Gastrointestinal: Negative for nausea, vomiting and abdominal pain.  Skin: Negative for rash.  Neurological: Negative for headaches.  All other systems reviewed and are negative.     Allergies  Peanut-containing drug products; Ace inhibitors; Morphine; and Demerol  Home Medications   Prior to Admission medications   Medication Sig Start Date End Date Taking? Authorizing Provider  acetaminophen (TYLENOL) 500 MG tablet Take 1,000 mg by mouth every 6 (six) hours as needed for moderate pain.    Historical Provider, MD  amiodarone (PACERONE) 200 MG tablet Take 1 tablet (200 mg total) by mouth daily. 06/14/14   Amy D Ninfa Meeker, NP  apixaban (ELIQUIS) 5 MG TABS tablet Take 1 tablet (5 mg total) by mouth 2 (two) times daily. 06/07/14   Rande Brunt, NP  Coenzyme Q10 (CO Q 10) 100 MG CAPS Take 100 mg by mouth daily.  Historical Provider, MD  furosemide (LASIX) 40 MG tablet Take 1 tablet (40 mg total) by mouth 2 (two) times daily. 06/07/14   Rande Brunt, NP  hydrALAZINE (APRESOLINE) 25 MG tablet Take 1 tablet (25 mg total) by mouth 3 (three) times daily. 06/07/14   Rande Brunt, NP  isosorbide mononitrate (IMDUR) 30 MG 24 hr tablet Take 1 tablet (30 mg total) by mouth daily. 06/07/14   Rande Brunt, NP  milrinone [facility]) 20 MG/100ML SOLN infusion Inject 20.05 mcg/min into the vein continuous. 06/07/14   Rande Brunt, NP  potassium chloride SA (K-DUR,KLOR-CON) 20 MEQ tablet Take 2 tablets (40 mEq total) by  mouth daily. 06/07/14   Rande Brunt, NP  spironolactone (ALDACTONE) 25 MG tablet Take 1 tablet (25 mg total) by mouth daily. 06/07/14   Rande Brunt, NP   BP 158/112  Pulse 128  Temp(Src) 99.6 F (37.6 C) (Oral)  Resp 24  SpO2 100%  LMP 10/28/1999 Physical Exam  Nursing note and vitals reviewed. Constitutional: She is oriented to person, place, and time. She appears well-developed and well-nourished. No distress.  HENT:  Head: Normocephalic and atraumatic.  Mild swelling noted to the left tongue and cheek, clear view of the soft palate and posterior oropharynx, no obvious swelling of the tonsils or uvula, patient able to elevate tongue without difficulty  Eyes: Pupils are equal, round, and reactive to light.  Neck: Neck supple.  Swelling of the left submandibular region > right  Cardiovascular: Normal rate, regular rhythm and normal heart sounds.   No murmur heard. Pulmonary/Chest: Effort normal and breath sounds normal. No respiratory distress. She has no wheezes.  Lymphadenopathy:    She has cervical adenopathy.  Neurological: She is alert and oriented to person, place, and time.  Skin: Skin is warm and dry. No rash noted.  Psychiatric: She has a normal mood and affect.    ED Course  Procedures (including critical care time) Labs Review Labs Reviewed - No data to display  Imaging Review No results found.   EKG Interpretation None     Medications  predniSONE (DELTASONE) tablet 60 mg (60 mg Oral Given 06/22/14 0627)  famotidine (PEPCID) tablet 20 mg (20 mg Oral Given 06/22/14 0627)  diphenhydrAMINE (BENADRYL) capsule 25 mg (25 mg Oral Given 06/22/14 8527)    MDM   Final diagnoses:  Angioedema, initial encounter    Patient presents with idiopathic swelling of the tongue.  ABCs intact.  Airway patent.  No evidence of anaphylaxis or [place].  No obvious precipitating factors.  Given benadryl, pepcid and steroids.  On recheck at 1 hr, no improvement but no worsening.     Will sign out.  Recheck at 8:30 am to insure no progression.  Signed out to Dr. Tomi Bamberger.    Merryl Hacker, MD 06/22/14 [place], MD 06/22/14 [phone]

## 2014-06-22 NOTE — ED Notes (Signed)
Pt reporting angioedema that she noticed this morning when she woke. Pt denies any difficulty breathing, stating the swelling primarily in her tongue. Pt able to speak clearly with little slurring.

## 2014-06-22 NOTE — ED Provider Notes (Signed)
Pt left at change of shift to recheck around 8:30 for her angioedema. Pt states her swelling in her tongue is gone, still has some swelling in her left neck. No difficulty swallowing. Feels ready to go home, will return if anything gets worse. Denies any new exposures.   Tongue appears normal, speech normal. Has some fullness in her left upper neck. Breathing normal.   Rolland Porter, MD, Abram Sander   Janice Norrie, MD 06/22/14 [phone]

## 2014-06-27 ENCOUNTER — Telehealth: Payer: Self-pay | Admitting: Licensed Clinical Social Worker

## 2014-06-27 NOTE — Telephone Encounter (Signed)
CSW contacted patient to follow up on pending Social Security and [employer]. Patient reports she received a return call from [facility] asking a few questions and is hopeful to have a determination soon. Patient stated she will contact CSW if further information arises. CSW continues to be available as needed. Raquel Sarna, [place]

## 2014-06-27 NOTE — Progress Notes (Signed)
[place] CLINICAL SOCIAL WORK DOCUMENTATION LVAD (Left Ventricular Assist Device) Psychosocial Screening Please remember that all information is confidential within the members of the VAD team and [facility]  06/14/2014 1:00pm  Patient:  Anita Warren  MRN:  [id number]  Account:  [id number]  Clinical Social Worker:  Ulla Gallo, LCSW Date/Time Initiated:   06/14/2014 01:00 PM Referral Source:    Zada Girt, [place] Coordinator  Referral Reason:   LVAD Implantation  Source of Information:   Patient, Olin Hauser Scales-sister/primary caregiver, patient chart   PATIENT DEMOGRAPHICS NAME:   Anita Warren. [name]     DOB:  1961/06/07  SS#:  [id number] Address:   [address]  [place] [address] Home Phone:  [phone]  Cell Phone:    Marital Status:   married     Primary Language:  ENGLISH  Faith:  APOSTOLIC Adherence with Medical Regimen:   Compliant  Medication Adherence:   yes  Physician appointment attendance:   compliant   Do you have a Living Will or Medical POA?  N Would you like to complete a Living Will and Medical POA prior to surgery?  N Are you currently a DNR?  N Do you have a MOST form?  N Would you like to review one?  N Do you have goals of care?  Y   Have you had a consult with the palliative care team at [facility]?  Y Comment:  Patient reports she has discussed her goals of care with her husband and family and does not wish to complete an advanced directive at this time.  Psychological Health Appearance:   Patient was appropriately dressed and neat in appearance during interview in the outpatient clinic.  Mental status:   Patient was alert and oriented. Patient was interactive, calm and stable.  Eye Contact:   Good  Thought Content:   Patient's thoughts were coherent, rational and relevant.  Speech:   Normal rate, volume and tone.  Mood:   Patient was in good spirits.  Affect:   Interactive and  appropriate  Insight:   Good  Judgment:   Sound  Interaction Style:   Good   Family/Social Information Who lives in your home? Name9  Anita Warren   Age9  [phone]   Relationship to you9  husband  son    Do you have a plan for child care if relevant?   n/a  List family members outside the home (parents, friends, pastor, etc..) Name2  [facility]  Anita Warren   Age2  [phone]  53yo   Relationship to you2  sister  sister    Please list people who give you emotional support (family, parents, friends, pastor, etc..) Anita Warren   Age3  72yo   Relationship to [phone]  father/pastor    Who is your primary and backup support pre and post-surgery? Explain the relationships i.e. strengths/weakness, etc.:   Olin Hauser Scales- sister-  Secondary Caregiver identified:   Sonia Side Scales- faher/pastor-   Legal Do you currently have any legal issues/problems?   no  Durable POA or Legal Next of Kin:   Anita Warren   -husband   Living situation Travel distance to [facility]:  35 minutes  Second Hand Smoke Exposure:   none  Self- Care level:   Independent  Ambulation:   Independent  Transportation:   Patient reports she drives and has multiple options for transportation within the family.  Limitations:  denies any limitations  Barriers impacting ability to participate in care:   Patient denies any barriers.   Community Are you active with community agencies/resources?   no community agencies  Are you active in a church, Glass blower/designer, mosque, or other faith based community?   [place] other sources do you have for spiritual support?   Patient reports that her father is also the pastor of her church and he is a strong spiritual support in her life.  Are you active in any clubs or social organizations?   no  What do you do for fun? hobbies, interests?  Read, walk, visit family,watch movies and has 4  grandchildren   Education/Work information What is the last grade of school you completed?  12 th grade  Preferred method of learning? (Written, verbal, hands on):   hands on  Do you have any problems with reading or writing?  no  Are you currently employed? If no, when were you last employed?   Patient reports she was last employed Novermber, 2014.  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   no  If yes, what type of employment do you hope to find?   n/a  Are you interested in job training or learning new skills?   no  Did you serve in the [employer]? If so, what branch?   no   Financial Information What is your source of income?    Long Term Disability from [facility]  Do you have difficulty meeting your monthly expenses?   no  If yes, which ones?   How do you usually cope with this?    denies any concern presently  Primary Health Insurance:    Currently is not insured  Secondary Insurance:    none  Have you applied for Medicaid?    pending  Have you applied for Social Security Disability?    pending  Do you have prescription coverage?    Patient assistance programs  What are your prescription co-pays?    varies  Are you required to use a certain pharmacy?    [place] you have a mail order option for your prescriptions?    no  If yes, what pharmacy do you use for mail order?   Have you ever refused medication due to cost?    Patient reports "sometimes I didn't get it because my insurance lapsed"  Discuss monthly cost for dressing supplies post procedure $150-300    discussed and reviewed  Can you budget for this monthly expense?    Patient reports family will assist as  needed.   Medical Information Briefly describe your medical history, surgeries and why you are here for evaluation.    Patient reports in 2011 she was hospitalized due to shortness of breathe and was diagnosed with bronchitis. They found "fluid around my heart and lungs due to a viral infection". She reported that her EF was at 10% and was diagnosed with "viral cardiomyopathy". In 2012, she received a defibrillator and then in 2014 began to decline again from heart failure.  Are you able to complete your ADL's?    Independent  Do you  have any history of emotional, medical, physical or verbal trauma?    Patient denies  Do you have any family history of heart problems?    Mother- enlarged heart and died at age 66.  Do you smoke? If so, what is the amount and frequency?    no  Do you drink alcohol? If so, how many drinks a day/week?    no  Have you ever used illegal drugs or misused medications?    no  If yes, what drugs do you use and how often?    n/a  Have you ever been treated for substance abuse?    no  If yes, where and when were you treated?    n/a  Are you currently using illegal substances?    no   Mental Health History Have you ever had problems with depression, anxiety or other mental health issues?   no  If yes, have you seen a counselor, psychiatrist or therapist?    n/a  If you are currently experiencing problems are you interested in talking with a professional?    no  Would you be interested in participating in an LVAD support group?  Y LVAD support group for: Patient Caregivers patient Have you or are you taking any medications for anxiety/depression or any mental health concerns?    no  If yes, Please list the medications?    n/a  How have you been feeling in the past year?    "sick,weak and SOB"  How do you handle stressful situations?   Patient states that she calls her sister or her Dad for support.  What are your coping strategies? Please List:     Patient reports she "talks it out".  Have you had any past or current thoughts of suicide?    no  How many hours do you sleep at night?    7-8hrs  How is your appetite?   "so-so"  PHQ2- Depression Screen:    0  PHQ9 Depression screen (only complete if the PHQ2 is positive):    n/a   Plan for VAD Implementation Do you know and understand what happens during VAD surgery?  Please describe your thoughts:   Patient's understanding of the surgery was accurate and realistic. Patient appeared to have a good understanding of the surgery and recovery process.  What do you know about the risks of any major surgery or use of general anesthesia?    Patient was able to verbalize the risks involved with major surgery. She stated understanding of infection, stroke and death as risk factors.  What do you know about the risks and side effects associated with VAD surgery?    Patient's understanding of the risk was accurate and congruent with reality.  Explain what will happen right after surgery?    Patient explained understanding of OR, recovery room and transfer to ICU and intubation for the first 24-48 hours at a minimum.  Information obtained from:    Physician, VAD Coordinator, video and comapny brochures.  What is your plan for transportation for the first 8 weeks post-surgery? (Patients are not recommended to drive post-surgery for 8 weeks)    Patient reports that her sister Olin Hauser will assist with all driving needs.  Driver: Olin Hauser Scales- sister  Valid license:    yes  Working Conservator, museum/gallery:    2000 Buick [employer]  Airbags:  yes Do you plan to disarm the airbags- there is a risk of discharging the device if the airbag were to  deploy.   Patient is unsure as she plans to remain seated in the back seat.  What do you know about your diet post-surgery?    heart healthy  How do you plan to monitor your medications, current and future?    Patient states that she and her husband fill  her med box weekly.  How do you plan to complete ADL's post-surgery (Shower, dress, etc.)?    Patient states that her sister will assist as needed.  Will it be difficult to ask for help for your caregivers? If so, explain:    She denies any concerns with asking for assistance when needed.  Please explain what you hope will be improved about your life as a result of receiving the VAD    "My heart will be stronger to allow me to be with my family"  Please tell me your biggest concern or fear about receiving the VAD?    Pateint denies any fears at the moment.  How do you cope with your concerns/fears?    Pateient reports "talk it through" and "my faith".  Please explain your understanding of how your body will change? Are you worried about these changes?    Patient stated that she will be like "robo cop" and then stated she is fine with it.  Do you see any barriers to your surgery or follow up? If yes, please explain:   Pateint denied any barriers.     Understanding of LVAD Surgical procedures and risks:    discussed and reviewed  Electrical need for LVAD:    discussed and reviewed and denied any electrical needs  Safety precautions with LVAD (Water, etc.):   discussed and reviewed  Potential side effects with LVAD:    discussed and reviewed  Types of Advanced heart failure therapies available:    discussed and reviewed  LVAD daily self-care (dressing changes, computer check, extra supplies):    discussed and reviewed  Outpatient follow-up (follow-up in LVAD clinic; monitoring blood thinners)    discussed and reviewed  Need for emergency planning:    discussed and reviewed- Patient reports she could use [employer] Na for emergency generator if needed.  Expectations for LVAD:    Patient reports "to be stronger and have more time with my family"  Current level of motivation to prepare for LVAD:    Highly motivated  Patient's perception of need for LVAD:    Accurate and realistic   Present level of consent for LVAD:    Ready  Reasons for seeking LVAD:    Patient again stated "to be stronger and have more time with my family"   Psychosocial Protective Factors  Good psychosocial support from husband, sister and father Compliant with medical regimen Good coping skills Positive discussion with family around goals of care Independent with ADL's Reliable transportation No identified barriers impacting care Strong faith and involvement with [facility] community Motivated for recovery to "have more family time" Prefers hands on learning and no other identified issues with reading and writing Long term Disability income-pending SSD No drug use/dependence No history of trauma No mental health history No depression or anxiety No legal issues Realistic understanding of surgery and risk factors   Psychosocial Risk Factors  Financial concerns-  lack of  solid prescription plan, pending SSD and medicaid No health Insurance  Clinical Intervention: Discussed and encouraged continue follow up on pending Social Security and Insurance underwriter.   Educated patient/family on the following Caregiver(s) role responsibilities:   discussed and  reviewed  Financial planning for LVAD:    discussed and reviewed  Role of Clinical Social Worker:    discussed and reviewed  Signs of depression and anxiety:    discussed and reviewed- none noted at this time  Support planning for LVAD:    discussed and reviewed- most information provided by LVAD Coordinator  LVAD process:    discussed and reviewed- most information provided by LVAD Coordinator  Caregiver contract/agreement:   discussed and reviewed- information and contract provided by LVAD Coordinator  Discussed Referral(s) to:    Continued follow up with Social Security and Medicaid.  Community Resources:    None at this time- CSW will continue to assess throughout recovery.  Clinical Impression Recommendations:    Ms.  Darrick Meigs is a good psychosocial candidate for LVAD implantation although lack of finances and no health insurance poses a significant psychosocial risk factor. She is married and has a tremendous support system from her sister and father. She reports compliance with medical regimen although has had difficulty obtaining medications and has utilized patient assistance programs and at times has not obtained all her medications due to lack of finances. She denies having a Living Will/HPOA but states that she has had a goals of acre conversation with her family members. Her sister Olin Hauser will be her primary caregiver and her father will be the back up. She reports strong family support and faith in 15. 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 She currently has a long term disability income and awaiting determination on SSD and medicaid. She has no history of trauma, drug or alcohol use. She has no history of depression or mental health and scored a 0 on the PHQ-2. She appears to have good coping skills and motivated for surgery to allow her "more time with my family and feel stronger". CSW will continue to follow for support and assistance/advocacy for SSD/Medicaid application. Raquel Sarna, [place]

## 2014-06-30 ENCOUNTER — Other Ambulatory Visit (HOSPITAL_COMMUNITY): Payer: Self-pay | Admitting: *Deleted

## 2014-06-30 ENCOUNTER — Encounter (HOSPITAL_COMMUNITY): Payer: Self-pay

## 2014-06-30 ENCOUNTER — Telehealth (HOSPITAL_COMMUNITY): Payer: Self-pay | Admitting: *Deleted

## 2014-06-30 ENCOUNTER — Ambulatory Visit (HOSPITAL_COMMUNITY)
Admission: RE | Admit: 2014-06-30 | Discharge: 2014-06-30 | Disposition: A | Discharge status: Home / Self Care | Payer: Medicaid Other | Source: Ambulatory Visit | Attending: Internal Medicine | Admitting: Internal Medicine

## 2014-06-30 ENCOUNTER — Encounter: Payer: Self-pay | Admitting: Internal Medicine

## 2014-06-30 VITALS — BP 132/93 | HR 93 | Wt 170.4 lb

## 2014-06-30 DIAGNOSIS — I2129 ST elevation (STEMI) myocardial infarction involving other sites: Secondary | ICD-10-CM | POA: Diagnosis not present

## 2014-06-30 DIAGNOSIS — I5022 Chronic systolic (congestive) heart failure: Secondary | ICD-10-CM

## 2014-06-30 DIAGNOSIS — I509 Heart failure, unspecified: Secondary | ICD-10-CM | POA: Insufficient documentation

## 2014-06-30 DIAGNOSIS — I4891 Unspecified atrial fibrillation: Secondary | ICD-10-CM | POA: Diagnosis not present

## 2014-06-30 DIAGNOSIS — I517 Cardiomegaly: Secondary | ICD-10-CM | POA: Insufficient documentation

## 2014-06-30 DIAGNOSIS — I5023 Acute on chronic systolic (congestive) heart failure: Secondary | ICD-10-CM

## 2014-06-30 DIAGNOSIS — I4892 Unspecified atrial flutter: Secondary | ICD-10-CM

## 2014-06-30 DIAGNOSIS — Z01818 Encounter for other preprocedural examination: Secondary | ICD-10-CM

## 2014-06-30 LAB — HEPATIC FUNCTION PANEL
ALK PHOS: 43 U/L (ref 39–117)
ALT: 23 U/L (ref 0–35)
AST: 17 U/L (ref 0–37)
Albumin: 3.4 g/dL — ABNORMAL LOW (ref 3.5–5.2)
BILIRUBIN TOTAL: 0.6 mg/dL (ref 0.3–1.2)
Total Protein: 7.4 g/dL (ref 6.0–8.3)

## 2014-06-30 LAB — TSH: TSH: 1.66 u[IU]/mL (ref 0.350–4.500)

## 2014-06-30 LAB — CARBOXYHEMOGLOBIN
Carboxyhemoglobin: 1.2 % (ref 0.5–1.5)
Methemoglobin: 0.7 % (ref 0.0–1.5)
O2 Saturation: 82 %
Total hemoglobin: 14.8 g/dL (ref 12.0–16.0)

## 2014-06-30 MED ORDER — HYDRALAZINE HCL 50 MG PO TABS: 50.0000 mg | ORAL_TABLET | Freq: Three times a day (TID) | ORAL | Status: DC

## 2014-06-30 MED ORDER — ISOSORBIDE MONONITRATE ER 60 MG PO TB24: 60.0000 mg | ORAL_TABLET | Freq: Every day | ORAL | Status: DC

## 2014-06-30 NOTE — Progress Notes (Signed)
Patient ID: Anita Warren, female   DOB: 18-May-1961, 53 y.o.   MRN: [id number] Primary Physician: Glo Herring., MD  Primary Cardiologist: Carlyle Dolly MD [facility])   HPI: Anita Warren is a 53 y.o.female with history of viral, non-ischemic cardiomyopathy, severe systolic dysfunction with EF of 10% s/p ICD pacemaker in the setting of VT 02/2011 ([place] VR model CD1231-40Q (serial [phone]);  Most recent cath 2012 with no CAD.   Admitted July 28 th with acute on chronic systolic CHF in the setting of medical non-compliance, not taking medications for 4 months because she lost her insurance. She was diuresed with IV lasix and transitioned to lasix 40 mg dialy. She was also discharged on 12.5 mg carvedilol twice a day, hydralaizine 25 mg tid, and 30 mg Imdur daily. Discharge weight was 176 pounds. She was not on  an Ace due to angioedema.  Admitted from HF clinic 8/715 with low output heart failure. Swan placed and showed cardiogenic shock. Ultimately discharged on  Milrinone 0.25 mcg . Hospital stay was complicated by atrial flutter. Loaded on amiodarone and started on eliquis 5 mg twice a day. Met with Dr Prescott Gum and VAD coordinator.   She returns for post hospital follow up. Feels great. Denies SOB/PND/Orthopnea. Weight at home 166 pounds. No bleeding problems. Taking all medications. [facility] following for home milrinone and weekly BMET. Disability and Medicaid in pending. She was in the ER recently with angioedema again.  She is no longer taking ACEI or ARB.  She is on amiodarone that rarely can cause angioedema but she has had episodes before she was on amiodarone.  She is taking Lasix 40 mg daily, cut back with increase in creatinine recently.   05/23/14 ECHO EF 5-10% RV mod to severely dilated.   ECG: NSR, septal Qs, lateral TWIs (narrow QRS)  Labs:  HIV negative 2015  9/15: K 3.6, creatinine 0.99 => 1.5, Mg 1.9, HCT 45.2  2013 Colonoscopy Blood Type:  B+  Lower  Extremity Doppler - Normal arterial flow Carotid Doppler-  -39 percent stenosis involving the right internal carotid artery and the left internal carotid Artery.  SH: Disabled  Lives with husband and son. Has 3 grown children. Does not smoke or drink alcohol.. 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   FH: Mom died at 46 CAD         Sister and Brother HTN    ROS: All systems negative except as listed in HPI, PMH and Problem List.  Past Medical History  Diagnosis Date  . Nonischemic cardiomyopathy     LVEF 5-10%, likely viral (no CAD by cath 01/30/11)  . Essential hypertension, benign   . NSVT (nonsustained ventricular tachycardia)   . Obesity   . Hydronephrosis with renal and ureteral calculus obstruction 09/05/2013  . Chronic systolic heart failure   . History of medication noncompliance     Current Outpatient Prescriptions  Medication Sig Dispense Refill  . acetaminophen (TYLENOL) 500 MG tablet Take 1,000 mg by mouth every 6 (six) hours as needed for moderate pain.      [place] amiodarone (PACERONE) 200 MG tablet Take 1 tablet (200 mg total) by mouth daily.  30 tablet  3  . apixaban (ELIQUIS) 5 MG TABS tablet Take 1 tablet (5 mg total) by mouth 2 (two) times daily.  60 tablet  3  . Coenzyme Q10 (CO Q 10) 100 MG CAPS Take 100 mg by mouth daily.       . famotidine (PEPCID)  20 MG tablet Take 1 tablet (20 mg total) by mouth daily.  10 tablet  0  . furosemide (LASIX) 40 MG tablet Take 1 tablet (40 mg total) by mouth 2 (two) times daily.  60 tablet  3  . hydrALAZINE (APRESOLINE) 50 MG tablet Take 1 tablet (50 mg total) by mouth 3 (three) times daily.  90 tablet  3  . isosorbide mononitrate (IMDUR) 60 MG 24 hr tablet Take 1 tablet (60 mg total) by mouth daily.  30 tablet  3  . milrinone (PRIMACOR) 20 MG/100ML SOLN infusion Inject 20.05 mcg/min into the vein continuous.  100 mL  1  . potassium chloride SA (K-DUR,KLOR-CON) 20 MEQ tablet Take 2 tablets (40 mEq total) by mouth daily.  60 tablet  3  .  spironolactone (ALDACTONE) 25 MG tablet Take 1 tablet (25 mg total) by mouth daily.  30 tablet  3   No current facility-administered medications for this encounter.     PHYSICAL EXAM: Filed Vitals:   06/30/14 0900  BP: 132/93  Pulse: 93  Weight: 170 lb 6.4 oz (77.293 kg)  SpO2: 99%    General:  NAD   HEENT: normal Neck: supple. JVP 5-6 . Carotids 2+ bilaterally; no bruits. No lymphadenopathy or thryomegaly appreciated. Cor: PMI normal. Regular rate & rhythm. No rubs, or murmurs. + S3  Lungs: clear Abdomen: soft, nontender, nondistended. No hepatosplenomegaly. No bruits or masses. Good bowel sounds. Extremities: no cyanosis, clubbing, rash. Lower extremities cool. R and LLE 1+ edema. RUE PICC  Neuro: alert & orientedx3, cranial nerves grossly intact. Moves all 4 extremities w/o difficulty. Affect pleasant.  ASSESSMENT & PLAN: 1. Chronic Systolic Heart Failure:  Nonischemic cardiomyopathy thought to potentially be due to viral myocarditis noted initially in 2011. Cath 2012 norm cororonaries.  Echo (7/15) with EF 5-10% and RV mod-severely dilated with moderately decreased systolic function.  Recently admitted with cardiogenic shock. On home Milrinone now 0.25 mcg/kg/min via PICC. Currently being worked up for advanced therapies. NYHA class II symptoms currently on milrinone.  She does not appear volume overloaded on exam.   Completed Pre LVAD tests Blood Type- B+  Lower Extremity Doppler - Normal arterial flow Carotid Doppler-  -39 percent stenosis involving the right internal carotid artery and the left internal carotid Artery. Had colonoscopy in 2013. CT on chest-06/06/2014 - ? R base opacities? Infections. Needs repeat 5 weeks.   PFTs: pending  - Continue Lasix 40 mg daily.  Will need to follow creatinine closely.  - No beta blocker due to low output or ACEI/ARB due to recurrent episodes of angioedema.  - Increase hydralazine to 50 mg tid and Imdur to 60 mg daily.   - I will  check a co-ox today, if it looks good will try backing down on milrinone to 0.125 mcg/kg/min.  - Completed HF SW evaluation today as part of VAD work up. She has advanced directives information. She is very interested in advanced therapies to include VAD versus transplant.  Will refer [facility] clinic, will need Medicaid coverage which is pending.  - Reinforced daily weights, low salt diet and limiting fluid intake to < 2 liters per day.  - Followed by [facility]. Plan to repeat BMET next week.   2. PAF: NSR today. No bleeding problems on eliquis 5 mg twice a day. Continue amiodarone, will send LFTs and TSH today.   3. Angioedema: Had in past on ACEI, had another episode recently.  She is no longer on ACEI or  ARB. Amiodarone can uncommonly cause angioedema but as she had angioedema also prior to amiodarone, suspect this is not the culprit.  ?Hereditary or acquired C1 inhibitor deficiency.   4. AKI: Creatinine up to 1.5, have cut back Lasix.  Will have BMET again next week.   Followup in 2 wks.   Loralie Champagne 06/30/2014 10:10 AM

## 2014-06-30 NOTE — Patient Instructions (Signed)
Labs today.  INCREASE Hydralazine to 50 mg three times a day INCREASE Imdur to 60 mg daily  Your physician recommends that you schedule a follow-up appointment in: 2 weeks with physician  Do the following things EVERYDAY: 1) Weigh yourself in the morning before breakfast. Write it down and keep it in a log. 2) Take your medicines as prescribed 3) Eat low salt foods-Limit salt (sodium) to 2000 mg per day.  4) Stay as active as you can everyday 5) Limit all fluids for the day to less than 2 liters 6)

## 2014-06-30 NOTE — Telephone Encounter (Signed)
Called pt, not home. Left message with husband informing her PFT's scheduled 07/17/14 at 10:00 am (following heart failure clinic appt same day). Asked that she not have caffeine, bronchodilators, inhalers for 4 hours prior to exam. Asked that she call and re-schedule if this time is not convenient; husband verbalized understanding of above and will give her the message.

## 2014-07-01 NOTE — Addendum Note (Signed)
Encounter addended by: Bonne Dolores, NT on: 07/01/2014 12:31 PM<BR>     Documentation filed: Charges VN

## 2014-07-05 ENCOUNTER — Telehealth (HOSPITAL_COMMUNITY): Payer: Self-pay | Admitting: *Deleted

## 2014-07-05 MED ORDER — MILRINONE IN DEXTROSE 20 MG/100ML IV SOLN: 0.1250 ug/kg/min | INTRAVENOUS | Status: DC

## 2014-07-05 NOTE — Telephone Encounter (Signed)
Message sent to [facility] 

## 2014-07-05 NOTE — Telephone Encounter (Signed)
Message copied by Scarlette Calico on Wed Jul 05, 2014  1:37 PM ------      Message from: Larey Dresser      Created: Fri Jun 30, 2014 11:47 PM       Co-ox 82%, can decrease milrinone to 0.125 mcg/kg/min ------

## 2014-07-11 ENCOUNTER — Telehealth (HOSPITAL_COMMUNITY): Payer: Self-pay | Admitting: *Deleted

## 2014-07-11 MED ORDER — MAGNESIUM OXIDE -MG SUPPLEMENT 400 (240 MG) MG PO TABS: 1.0000 | ORAL_TABLET | Freq: Every day | ORAL | Status: DC

## 2014-07-11 NOTE — Telephone Encounter (Signed)
Received labs from [name] on 9/14 mag level was 1.6 per Anita Bame, NP start mag-ox 400 mg daily, have been unable to reach pt until today, she is aware and agreeable, rx sent in

## 2014-07-17 ENCOUNTER — Ambulatory Visit (HOSPITAL_COMMUNITY)
Admission: RE | Admit: 2014-07-17 | Discharge: 2014-07-17 | Disposition: A | Discharge status: Home / Self Care | Payer: Medicaid Other | Source: Ambulatory Visit | Attending: Internal Medicine | Admitting: Internal Medicine

## 2014-07-17 ENCOUNTER — Ambulatory Visit (HOSPITAL_COMMUNITY)
Admission: RE | Admit: 2014-07-17 | Discharge: 2014-07-17 | Disposition: A | Discharge status: Home / Self Care | Payer: Medicaid Other | Source: Ambulatory Visit | Attending: Cardiology | Admitting: Cardiology

## 2014-07-17 ENCOUNTER — Telehealth (HOSPITAL_COMMUNITY): Payer: Self-pay | Admitting: *Deleted

## 2014-07-17 VITALS — BP 122/82 | HR 99 | Wt 170.5 lb

## 2014-07-17 DIAGNOSIS — I10 Essential (primary) hypertension: Secondary | ICD-10-CM | POA: Insufficient documentation

## 2014-07-17 DIAGNOSIS — X58XXXA Exposure to other specified factors, initial encounter: Secondary | ICD-10-CM | POA: Diagnosis not present

## 2014-07-17 DIAGNOSIS — I4729 Other ventricular tachycardia: Secondary | ICD-10-CM | POA: Diagnosis not present

## 2014-07-17 DIAGNOSIS — Z91199 Patient's noncompliance with other medical treatment and regimen due to unspecified reason: Secondary | ICD-10-CM | POA: Insufficient documentation

## 2014-07-17 DIAGNOSIS — I4891 Unspecified atrial fibrillation: Secondary | ICD-10-CM | POA: Insufficient documentation

## 2014-07-17 DIAGNOSIS — I5023 Acute on chronic systolic (congestive) heart failure: Secondary | ICD-10-CM | POA: Insufficient documentation

## 2014-07-17 DIAGNOSIS — Z79899 Other long term (current) drug therapy: Secondary | ICD-10-CM | POA: Insufficient documentation

## 2014-07-17 DIAGNOSIS — R0609 Other forms of dyspnea: Secondary | ICD-10-CM | POA: Diagnosis present

## 2014-07-17 DIAGNOSIS — I5022 Chronic systolic (congestive) heart failure: Secondary | ICD-10-CM

## 2014-07-17 DIAGNOSIS — I6529 Occlusion and stenosis of unspecified carotid artery: Secondary | ICD-10-CM | POA: Insufficient documentation

## 2014-07-17 DIAGNOSIS — I517 Cardiomegaly: Secondary | ICD-10-CM | POA: Diagnosis not present

## 2014-07-17 DIAGNOSIS — E669 Obesity, unspecified: Secondary | ICD-10-CM | POA: Insufficient documentation

## 2014-07-17 DIAGNOSIS — I428 Other cardiomyopathies: Secondary | ICD-10-CM | POA: Diagnosis not present

## 2014-07-17 DIAGNOSIS — Z01810 Encounter for preprocedural cardiovascular examination: Secondary | ICD-10-CM | POA: Diagnosis present

## 2014-07-17 DIAGNOSIS — I509 Heart failure, unspecified: Secondary | ICD-10-CM | POA: Insufficient documentation

## 2014-07-17 DIAGNOSIS — R0989 Other specified symptoms and signs involving the circulatory and respiratory systems: Secondary | ICD-10-CM | POA: Insufficient documentation

## 2014-07-17 DIAGNOSIS — N179 Acute kidney failure, unspecified: Secondary | ICD-10-CM | POA: Diagnosis not present

## 2014-07-17 DIAGNOSIS — I472 Ventricular tachycardia, unspecified: Secondary | ICD-10-CM | POA: Insufficient documentation

## 2014-07-17 DIAGNOSIS — Z9119 Patient's noncompliance with other medical treatment and regimen: Secondary | ICD-10-CM | POA: Diagnosis not present

## 2014-07-17 DIAGNOSIS — Z01818 Encounter for other preprocedural examination: Secondary | ICD-10-CM

## 2014-07-17 DIAGNOSIS — T783XXA Angioneurotic edema, initial encounter: Secondary | ICD-10-CM | POA: Diagnosis not present

## 2014-07-17 DIAGNOSIS — Z8249 Family history of ischemic heart disease and other diseases of the circulatory system: Secondary | ICD-10-CM | POA: Diagnosis not present

## 2014-07-17 DIAGNOSIS — I4892 Unspecified atrial flutter: Secondary | ICD-10-CM

## 2014-07-17 LAB — PULMONARY FUNCTION TEST
DL/VA % PRED: 98 %
DL/VA: 4.72 ml/min/mmHg/L
DLCO cor % pred: 77 %
DLCO cor: 18.9 ml/min/mmHg
DLCO unc % pred: 77 %
DLCO unc: 18.9 ml/min/mmHg
FEF 25-75 POST: 2.8 L/s
FEF 25-75 Pre: 2.62 L/sec
FEF2575-%Change-Post: 6 %
FEF2575-%PRED-POST: 119 %
FEF2575-%PRED-PRE: 112 %
FEV1-%Change-Post: 3 %
FEV1-%PRED-POST: 96 %
FEV1-%Pred-Pre: 93 %
FEV1-PRE: 2.11 L
FEV1-Post: 2.18 L
FEV1FVC-%Change-Post: -3 %
FEV1FVC-%PRED-PRE: 103 %
FEV6-%CHANGE-POST: 9 %
FEV6-%Pred-Post: 98 %
FEV6-%Pred-Pre: 90 %
FEV6-Post: 2.71 L
FEV6-Pre: 2.48 L
FEV6FVC-%CHANGE-POST: 0 %
FEV6FVC-%Pred-Post: 103 %
FEV6FVC-%Pred-Pre: 103 %
FVC-%CHANGE-POST: 7 %
FVC-%PRED-POST: 95 %
FVC-%PRED-PRE: 89 %
FVC-Post: 2.71 L
FVC-Pre: 2.52 L
PRE FEV1/FVC RATIO: 84 %
Post FEV1/FVC ratio: 81 %
Post FEV6/FVC ratio: 100 %
Pre FEV6/FVC Ratio: 100 %
RV % pred: 92 %
RV: 1.71 L
TLC % pred: 87 %
TLC: 4.44 L

## 2014-07-17 LAB — CARBOXYHEMOGLOBIN
Carboxyhemoglobin: 1.3 % (ref 0.5–1.5)
Methemoglobin: 0.8 % (ref 0.0–1.5)
O2 SAT: 86.5 %
Total hemoglobin: 14.9 g/dL (ref 12.0–16.0)

## 2014-07-17 MED ORDER — HYDRALAZINE HCL 50 MG PO TABS: 75.0000 mg | ORAL_TABLET | Freq: Three times a day (TID) | ORAL | Status: DC

## 2014-07-17 MED ORDER — ALBUTEROL SULFATE (2.5 MG/3ML) 0.083% IN NEBU
2.5000 mg | INHALATION_SOLUTION | Freq: Once | RESPIRATORY_TRACT | Status: AC
  Administered 2014-07-17: 2.5 mg via RESPIRATORY_TRACT

## 2014-07-17 MED ORDER — ISOSORBIDE MONONITRATE ER 60 MG PO TB24: 90.0000 mg | ORAL_TABLET | Freq: Every day | ORAL | Status: DC

## 2014-07-17 MED ORDER — DIGOXIN 125 MCG PO TABS: 0.1250 mg | ORAL_TABLET | Freq: Every day | ORAL | Status: DC

## 2014-07-17 NOTE — Progress Notes (Signed)
Patient ID: Anita Warren, female   DOB: 1960-11-14, 53 y.o.   MRN: [id number] Primary Physician: Glo Herring., MD  Primary Cardiologist: Carlyle Dolly MD [facility])   HPI: Anita Warren is a 53 y.o.female with history of viral, non-ischemic cardiomyopathy, severe systolic dysfunction with EF of 10% s/p ICD pacemaker in the setting of VT 02/2011 ([place] VR model CD1231-40Q (serial [phone]);  Most recent cath 2012 with no CAD.   Admitted July 28 th with acute on chronic systolic CHF in the setting of medical non-compliance, not taking medications for 4 months because she lost her insurance. She was diuresed with IV lasix and transitioned to lasix 40 mg dialy. She was also discharged on 12.5 mg carvedilol twice a day, hydralaizine 25 mg tid, and 30 mg Imdur daily. Discharge weight was 176 pounds. She was not on  an Ace due to angioedema.  Admitted from HF clinic 8/715 with low output heart failure. Swan placed and showed cardiogenic shock. Ultimately discharged on  Milrinone 0.25 mcg . Hospital stay was complicated by atrial flutter. Loaded on amiodarone and started on eliquis 5 mg twice a day. Met with Dr Prescott Gum and VAD coordinator.   She returns for follow up.  At last visit, co-ox was 83% and I decreased her milrinone to 0.125. She continues to do well.  No significant exertional dyspnea at this time.  No orthopnea or PND. Weight stable. No bleeding problems. Taking all medications. [facility] following for home milrinone and weekly BMET. Disability and Medicaid still pending.  She is no longer taking ACEI or ARB because of recurrent angioedema.  Maintainining NSR on amiodarone.    05/23/14 ECHO EF 5-10% RV mod to severely dilated.   ECG: NSR, septal Qs, lateral TWIs (narrow QRS)  Labs:  HIV negative 2015  9/15: K 3.6 => 4.6, creatinine 0.99 => 1.5 => 1.2, Mg 1.9, HCT 45.2, LFTs normal, TSH normal  2013 Colonoscopy Blood Type:  B+  Lower Extremity Doppler - Normal  arterial flow Carotid Doppler-  -39 percent stenosis involving the right internal carotid artery and the left internal carotid Artery.  SH: Disabled  Lives with husband and son. Has 3 grown children. Does not smoke or drink alcohol.. 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    FH: Mom died at 74 CAD         Sister and Brother HTN    ROS: All systems negative except as listed in HPI, PMH and Problem List.  Past Medical History  Diagnosis Date  . Nonischemic cardiomyopathy     LVEF 5-10%, likely viral (no CAD by cath 01/30/11)  . Essential hypertension, benign   . NSVT (nonsustained ventricular tachycardia)   . Obesity   . Hydronephrosis with renal and ureteral calculus obstruction 09/05/2013  . Chronic systolic heart failure   . History of medication noncompliance     Current Outpatient Prescriptions  Medication Sig Dispense Refill  . acetaminophen (TYLENOL) 500 MG tablet Take 1,000 mg by mouth every 6 (six) hours as needed for moderate pain.      Marland Kitchen amiodarone (PACERONE) 200 MG tablet Take 1 tablet (200 mg total) by mouth daily.  30 tablet  3  . apixaban (ELIQUIS) 5 MG TABS tablet Take 1 tablet (5 mg total) by mouth 2 (two) times daily.  60 tablet  3  . Coenzyme Q10 (CO Q 10) 100 MG CAPS Take 100 mg by mouth daily.       . furosemide (LASIX) 40 MG  tablet Take 40 mg by mouth daily.      . hydrALAZINE (APRESOLINE) 50 MG tablet Take 1.5 tablets (75 mg total) by mouth 3 (three) times daily.  90 tablet  3  . isosorbide mononitrate (IMDUR) 60 MG 24 hr tablet Take 1.5 tablets (90 mg total) by mouth daily.  30 tablet  3  . Magnesium Oxide 400 (240 MG) MG TABS Take 1 tablet by mouth daily.  30 tablet  6  . milrinone (PRIMACOR) 20 MG/100ML SOLN infusion Inject 10.025 mcg/min into the vein continuous.  100 mL  1  . potassium chloride SA (K-DUR,KLOR-CON) 20 MEQ tablet Take 2 tablets (40 mEq total) by mouth daily.  60 tablet  3  . spironolactone (ALDACTONE) 25 MG tablet Take 1 tablet (25 mg total) by  mouth daily.  30 tablet  3  . digoxin (LANOXIN) 0.125 MG tablet Take 1 tablet (0.125 mg total) by mouth daily.  90 tablet  3   No current facility-administered medications for this encounter.     PHYSICAL EXAM: Filed Vitals:   07/17/14 0902  BP: 122/82  Pulse: 99  Weight: 170 lb 8 oz (77.338 kg)  SpO2: 100%    General:  NAD   HEENT: normal Neck: supple. JVP 5-6 . Carotids 2+ bilaterally; no bruits. No lymphadenopathy or thryomegaly appreciated. Cor: PMI normal. Regular rate & rhythm. No rubs, or murmurs. No S3/S4 Lungs: clear Abdomen: soft, nontender, nondistended. No hepatosplenomegaly. No bruits or masses. Good bowel sounds. Extremities: no cyanosis, clubbing, rash. Lower extremities cool. No edema.  RUE PICC in place.  Neuro: alert & orientedx3, cranial nerves grossly intact. Moves all 4 extremities w/o difficulty. Affect pleasant.  ASSESSMENT & PLAN: 1. Chronic Systolic Heart Failure:  Nonischemic cardiomyopathy thought to potentially be due to viral myocarditis noted initially in 2011. Cath 2012 norm cororonaries.  Echo (7/15) with EF 5-10% and RV mod-severely dilated with moderately decreased systolic function.  Recently admitted with cardiogenic shock. On home Milrinone now 0.125 mcg/kg/min via PICC. Currently being worked up for advanced therapies. NYHA class II symptoms.  She does not appear volume overloaded on exam.   Completed Pre LVAD tests Blood Type- B+  Lower Extremity Doppler - Normal arterial flow Carotid Doppler-  -39 percent stenosis involving the right internal carotid artery and the left internal carotid Artery. Had colonoscopy in 2013. CT on chest-06/06/2014 - ? R base opacities? Infections. Needs repeat 5 weeks.   PFTs: pending  - Continue Lasix 40 mg daily.  Will need to follow creatinine closely.  - No beta blocker due to low output or ACEI/ARB due to recurrent episodes of angioedema.  - Increase hydralazine to 75 mg tid and Imdur to 90 mg daily.   - I  will check a co-ox today, if it looks good will try stopping milrinone. In preparation to stop milrinone, I am going to begin her on digoxin 0.125 mg daily.  - She is very interested in advanced therapies to include VAD versus transplant.  Will refer to [facility] transplant clinic, will need Medicaid coverage which is pending.  If she is able to come off milrinone successfully, will schedule CPX after next appointment.  - Reinforced daily weights, low salt diet and limiting fluid intake to < 2 liters per day.  - Followed by [facility]. Plan to repeat BMET next week.   2. PAF: NSR today. No bleeding problems on eliquis 5 mg twice a day. Continue amiodarone, Recent LFTs/TSH normal.  Will need regular eye exams  on amiodarone.  3. Angioedema: Had in past on ACEI, had another episode recently.  She is no longer on ACEI or ARB. Amiodarone can uncommonly cause angioedema but as she had angioedema also prior to amiodarone, suspect this is not the culprit.  ?Hereditary or acquired C1 inhibitor deficiency.   4. AKI: Creatinine improved, continue current Lasix.   Followup in 2 wks.   Loralie Champagne 07/17/2014 10:14 AM

## 2014-07-17 NOTE — Telephone Encounter (Signed)
Per Dr [name], co-ox good can stop milrinone, order sent to [facility], pt aware

## 2014-07-17 NOTE — Patient Instructions (Signed)
Start Digoxin 0.125 mg daily  Increase Hydralazine to 75 mg (1 & 1/2 tabs) Three times a day  Increase Imdur to 90 mg (1 & 1/2 tabs) daily  Lab drawn today, we will let you know if Milrinone can be stopped  Your physician recommends that you schedule a follow-up appointment in: 2 weeks

## 2014-07-20 ENCOUNTER — Encounter: Payer: Self-pay | Admitting: Internal Medicine

## 2014-07-24 NOTE — Consult Note (Signed)
Patient [name]      DOB: [birth year]      YIF:[id number]     Consult Note from the Palliative Medicine Team at [place] Requested by: Dr. Haroldine Laws   PCP: Glo Herring., MD Reason for Consultation: VAD eval    Phone Number:[phone]  Assessment of patients Current state: I met today with Anita Warren and her husband at bedside. Anita Warren now tells me that she is accepting and willing to have transplant - I know this has been a discussion but she assures me that she feels good about transplant option for her at this point. We also discussed potential LVAD as well and she is willing if she needs this therapy and is a candidate but she prefers transplant as first option. We also spent time discussing that if these therapies are not an option for her. She tells me that her father is a Clinical cytogeneticist and that she is very religious and spiritual. She says that she was raised where everyone speaks openly about death and she says she has no fears of dying. We discussed decisions that come when facing serious illness and potentially end of life. She has a very realistic perspective but is hoping to be considering for transplant and LVAD if necessary. I provided her with Advance Directive packet to consider. She has her husband of 12 years and 3 grown sons that live very close by to here and with her in the home. They are all very supportive. She tells me that her mother was young when she died and that her sons remember this and are very fearful now that she has been so ill. She is very happy and pleasant and engaging in conversation. No signs of depression. 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 Since not being able to return to work she will need to apply for Medicaid and this is in process with CSW assistance.    Goals of Care: 1.  Code Status: FULL   2. Scope of Treatment:  Continue all available and offered medical treatment. She would want temporary feeding tube if needed. She will consider long term tracheostomy and feeding tubes.    4. Disposition: Home when stable.    3. Symptom Management:   1. Pain: Acetaminophen prn moderate pain.  2. Shortness of breath: Continue management and milrinone per HF team.   4. Psychosocial: Emotional support provided to patient and family at bedside.   5. Spiritual: Anita Warren is very spiritual and relies on her religion to guide her decisions. She also tells me that when she was having increased issues with her atrial flutter that her father prayed with her via telephone and she believes this has helped.     Patient Documents Completed or Given: Document Given Completed  Advanced Directives Pkt yes   MOST    DNR    Gone from My Sight    Hard Choices      Brief HPI: 53 yo female admitted with cardiogenic shock. PMH significant for systolic heart failure (EF 5-10%), viral nonischemic cardiomyopathy, hypertension, hydronephrosis with renal/ureteral calculus.    ROS: Denies anxiety, sleep disturbance, nausea, constipation. + intermittent shortness of breath, + fatigue    PMH:  Past Medical History  Diagnosis Date  . Nonischemic cardiomyopathy     LVEF 5-10%, likely viral (no CAD by cath 01/30/11)  . Essential  hypertension, benign   . NSVT (nonsustained ventricular tachycardia)   . Obesity   . Hydronephrosis with renal and ureteral calculus obstruction 09/05/2013  . Chronic systolic heart failure   . History of medication noncompliance      PSH: Past Surgical History  Procedure Laterality Date  . Tonsillectomy    . Breast lumpectomy  1989    L breast- benign  . Tubal ligation    . Cholecystectomy    . Cardiac defibrillator placement  5/12    SJM by JA  . Colonoscopy  07/02/2012    Procedure: COLONOSCOPY;  Surgeon: [name], MD;  Location: [facility]O [facility]ITE;  Service: Endoscopy;  Laterality:  N/A;  1:15/PATIENT HAS A DEFIBRILLATOR  . Cystoscopy w/ ureteral stent placement Right 09/06/2013    Procedure: CYSTOSCOPY WITH RIGHT RETROGRADE PYELOGRAM; RIGHT URETERAL STENT PLACEMENT;  Surgeon: [name], MD;  Location: [facility] ORS;  Service: Urology;  Laterality: Right;   I have reviewed the [place] and SH and  If appropriate update it with new information. Allergies  Allergen Reactions  . Peanut-Containing Drug Products Anaphylaxis  . Ace Inhibitors Swelling  . Morphine Nausea And Vomiting  . Demerol Rash   Scheduled Meds: Continuous Infusions: PRN Meds:.      BP 150/116  Pulse 109  Temp(Src) 99.6 F (37.6 C) (Oral)  Resp 19  SpO2 100%  LMP 10/28/1999   PPS: 40%   Physical Exam:  General: NAD, pleasant HEENT: [place]/AT, + JVD, moist mucous membranes Chest: CTA throughout, no labored breathing, symmetric CVS: RRR, NSR on monitor Abdomen: Soft, NT, ND, +BS Ext: MAE, no edema, no cyanosis, warm to touch Neuro: Awake, alert, oriented x 3, follows commands  Labs: CBC    Component Value Date/Time   WBC 7.5 06/07/2014 0310   RBC 4.49 06/07/2014 0310   HGB 14.0 06/07/2014 0310   HCT 41.9 06/07/2014 0310   PLT 230 06/07/2014 0310   MCV 93.3 06/07/2014 0310   MCH 31.2 06/07/2014 0310   MCHC 33.4 06/07/2014 0310   RDW 15.3 06/07/2014 0310   LYMPHSABS 2.9 06/02/2014 1358   MONOABS 0.7 06/02/2014 1358   EOSABS 0.1 06/02/2014 1358   BASOSABS 0.1 06/02/2014 1358    BMET    Component Value Date/Time   NA 138 06/06/2014 0442   K 4.3 06/06/2014 0442   CL 103 06/06/2014 0442   CO2 23 06/06/2014 0442   GLUCOSE 100* 06/06/2014 0442   BUN 18 06/06/2014 0442   CREATININE 0.99 06/06/2014 0442   CREATININE 1.13* 07/15/2013 1559   CALCIUM 8.8 06/06/2014 0442   GFRNONAA 64* 06/06/2014 0442   GFRAA 75* 06/06/2014 0442    CMP     Component Value Date/Time   NA 138 06/06/2014 0442   K 4.3 06/06/2014 0442   CL 103 06/06/2014 0442   CO2 23 06/06/2014 0442   GLUCOSE 100* 06/06/2014 0442   BUN 18  06/06/2014 0442   CREATININE 0.99 06/06/2014 0442   CREATININE 1.13* 07/15/2013 1559   CALCIUM 8.8 06/06/2014 0442   PROT 7.4 06/30/2014 0921   ALBUMIN 3.4* 06/30/2014 0921   AST 17 06/30/2014 0921   ALT 23 06/30/2014 0921   ALKPHOS 43 06/30/2014 0921   BILITOT 0.6 06/30/2014 0921   GFRNONAA 64* 06/06/2014 0442   GFRAA 75* 06/06/2014 0442     Time In Time Out Total Time Spent with Patient Total Overall Time  1330 1420 3mn 532m    Greater than 50%  of this time  was spent counseling and coordinating care related to the above assessment and plan.  Vinie Sill, NP Palliative Medicine Team Pager # [phone] (M-F 8a-5p) Team Phone # [phone] (Nights/Weekends)

## 2014-07-29 NOTE — Consult Note (Signed)
I have reviewed and discussed case with Nurse Practitioner And agree with documentation and plan as noted above   Saranda Legrande J. Aaliyah Cancro D.O.  Palliative Medicine Team at [place]  Team Phone: [phone]    

## 2014-07-31 ENCOUNTER — Ambulatory Visit (HOSPITAL_COMMUNITY)
Admission: RE | Admit: 2014-07-31 | Discharge: 2014-07-31 | Disposition: A | Discharge status: Home / Self Care | Payer: Medicaid Other | Source: Ambulatory Visit | Attending: Internal Medicine | Admitting: Internal Medicine

## 2014-07-31 VITALS — BP 138/88 | HR 91 | Wt 175.0 lb

## 2014-07-31 DIAGNOSIS — I10 Essential (primary) hypertension: Secondary | ICD-10-CM | POA: Insufficient documentation

## 2014-07-31 DIAGNOSIS — Z9119 Patient's noncompliance with other medical treatment and regimen: Secondary | ICD-10-CM | POA: Diagnosis not present

## 2014-07-31 DIAGNOSIS — N179 Acute kidney failure, unspecified: Secondary | ICD-10-CM | POA: Diagnosis not present

## 2014-07-31 DIAGNOSIS — Z8249 Family history of ischemic heart disease and other diseases of the circulatory system: Secondary | ICD-10-CM | POA: Diagnosis not present

## 2014-07-31 DIAGNOSIS — Z7901 Long term (current) use of anticoagulants: Secondary | ICD-10-CM | POA: Insufficient documentation

## 2014-07-31 DIAGNOSIS — Z79899 Other long term (current) drug therapy: Secondary | ICD-10-CM | POA: Diagnosis not present

## 2014-07-31 DIAGNOSIS — I4892 Unspecified atrial flutter: Secondary | ICD-10-CM

## 2014-07-31 DIAGNOSIS — I480 Paroxysmal atrial fibrillation: Secondary | ICD-10-CM | POA: Insufficient documentation

## 2014-07-31 DIAGNOSIS — I472 Ventricular tachycardia: Secondary | ICD-10-CM | POA: Insufficient documentation

## 2014-07-31 DIAGNOSIS — E669 Obesity, unspecified: Secondary | ICD-10-CM | POA: Diagnosis not present

## 2014-07-31 DIAGNOSIS — T783XXD Angioneurotic edema, subsequent encounter: Secondary | ICD-10-CM | POA: Diagnosis not present

## 2014-07-31 DIAGNOSIS — I5022 Chronic systolic (congestive) heart failure: Secondary | ICD-10-CM | POA: Diagnosis present

## 2014-07-31 DIAGNOSIS — Z95810 Presence of automatic (implantable) cardiac defibrillator: Secondary | ICD-10-CM | POA: Diagnosis not present

## 2014-07-31 DIAGNOSIS — I428 Other cardiomyopathies: Secondary | ICD-10-CM | POA: Insufficient documentation

## 2014-07-31 DIAGNOSIS — Z87442 Personal history of urinary calculi: Secondary | ICD-10-CM | POA: Insufficient documentation

## 2014-07-31 LAB — BASIC METABOLIC PANEL
ANION GAP: 14 (ref 5–15)
BUN: 25 mg/dL — AB (ref 6–23)
CO2: 24 mEq/L (ref 19–32)
CREATININE: 1.36 mg/dL — AB (ref 0.50–1.10)
Calcium: 9.4 mg/dL (ref 8.4–10.5)
Chloride: 100 mEq/L (ref 96–112)
GFR calc Af Amer: 50 mL/min — ABNORMAL LOW (ref >90)
GFR calc non Af Amer: 44 mL/min — ABNORMAL LOW (ref >90)
Glucose, Bld: 99 mg/dL (ref 70–99)
Potassium: 4.5 mEq/L (ref 3.7–5.3)
Sodium: 138 mEq/L (ref 137–147)

## 2014-07-31 LAB — CARBOXYHEMOGLOBIN
CARBOXYHEMOGLOBIN: 0.7 % (ref 0.5–1.5)
Methemoglobin: 1.2 % (ref 0.0–1.5)
O2 Saturation: 65.8 %
Total hemoglobin: 15.3 g/dL (ref 12.0–16.0)

## 2014-07-31 LAB — DIGOXIN LEVEL: Digoxin Level: 1.1 ng/mL (ref 0.8–2.0)

## 2014-07-31 MED ORDER — CARVEDILOL 3.125 MG PO TABS: 3.1250 mg | ORAL_TABLET | Freq: Two times a day (BID) | ORAL | Status: DC

## 2014-07-31 NOTE — Addendum Note (Signed)
Encounter addended by: Scarlette Calico, RN on: 07/31/2014 10:28 AM<BR>     Documentation filed: Patient Instructions Section, Orders

## 2014-07-31 NOTE — Progress Notes (Signed)
Patient ID: Anita Warren, female   DOB: [birth year], 53 y.o.   MRN: [id number] Primary Physician: Anita Warren., MD  Primary Cardiologist: Anita Dolly MD [facility])   HPI: Anita Warren is a 53 y.o.female with history of viral, non-ischemic cardiomyopathy, severe systolic dysfunction with EF of 10% s/p ICD pacemaker in the setting of VT 02/2011 ([place] VR model CD1231-40Q (serial [phone]);  Most recent cath 2012 with no CAD.   Admitted July 28 th with acute on chronic systolic CHF she was not taking medications for 4 months because she lost her insurance. She was diuresed with IV lasix and transitioned to lasix 40 mg daily. She was also discharged on 12.5 mg carvedilol twice a day, hydralazine 25 mg tid, and 30 mg Imdur daily. Discharge weight was 176 pounds. She was not on  an Ace due to angioedema.  Admitted from HF clinic 8/715 with low output heart failure. Swan placed and showed cardiogenic shock. Ultimately discharged on  Milrinone 0.25 mcg . Hospital stay was complicated by atrial flutter. Loaded on amiodarone and started on eliquis 5 mg twice a day. Met with Anita Warren and VAD coordinator.   She returns for follow up.  Last seen 2 weeks ago by Anita Warren. Digoxin started and milrinone stopped. She continues to do well.  No significant exertional dyspnea at this time. Does all ADLs without any problems. Can walk 5-10 mins. Gets winded if she tries to go up hill or too fast.  No orthopnea or PND. Weight stable. No bleeding problems. Taking all medications. Disability and Medicaid still pending.  She is no longer taking ACEI or ARB because of recurrent angioedema.  Maintainining NSR on amiodarone.    05/23/14 ECHO EF 5-10% RV mod to severely dilated.   ECG: NSR, septal Qs, lateral TWIs (narrow QRS)  Labs:  HIV negative 2015  9/15: K 3.6 => 4.6, creatinine 0.99 => 1.5 => 1.2, Mg 1.9, HCT 45.2, LFTs normal, TSH normal 9/15: PFTs normal  2013 Colonoscopy Blood  Type:  B+  Lower Extremity Doppler - Normal arterial flow Carotid Doppler-  1-39% stenosis involving the right internal carotid artery and the left internal carotid Artery.  SH: Disabled  Lives with husband and son. Has 3 grown children. Does not smoke or drink alcohol.. 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    FH: Mom died at 42 CAD         Sister and Brother HTN    ROS: All systems negative except as listed in HPI, PMH and Problem List.  Past Medical History  Diagnosis Date  . Nonischemic cardiomyopathy     LVEF 5-10%, likely viral (no CAD by cath 01/30/11)  . Essential hypertension, benign   . NSVT (nonsustained ventricular tachycardia)   . Obesity   . Hydronephrosis with renal and ureteral calculus obstruction 09/05/2013  . Chronic systolic heart failure   . History of medication noncompliance     Current Outpatient Prescriptions  Medication Sig Dispense Refill  . acetaminophen (TYLENOL) 500 MG tablet Take 1,000 mg by mouth every 6 (six) hours as needed for moderate pain.      Marland Kitchen amiodarone (PACERONE) 200 MG tablet Take 1 tablet (200 mg total) by mouth daily.  30 tablet  3  . apixaban (ELIQUIS) 5 MG TABS tablet Take 1 tablet (5 mg total) by mouth 2 (two) times daily.  60 tablet  3  . Coenzyme Q10 (CO Q 10) 100 MG CAPS Take 100 mg by mouth  daily.       . digoxin (LANOXIN) 0.125 MG tablet Take 1 tablet (0.125 mg total) by mouth daily.  90 tablet  3  . furosemide (LASIX) 40 MG tablet Take 40 mg by mouth daily.      . hydrALAZINE (APRESOLINE) 50 MG tablet Take 1.5 tablets (75 mg total) by mouth 3 (three) times daily.  90 tablet  3  . isosorbide mononitrate (IMDUR) 60 MG 24 hr tablet Take 1.5 tablets (90 mg total) by mouth daily.  30 tablet  3  . Magnesium Oxide 400 (240 MG) MG TABS Take 1 tablet by mouth daily.  30 tablet  6  . potassium chloride SA (K-DUR,KLOR-CON) 20 MEQ tablet Take 2 tablets (40 mEq total) by mouth daily.  60 tablet  3  . spironolactone (ALDACTONE) 25 MG tablet Take 1  tablet (25 mg total) by mouth daily.  30 tablet  3   No current facility-administered medications for this encounter.     PHYSICAL EXAM: Filed Vitals:   07/31/14 1002  BP: 138/88  Pulse: 91  Weight: 175 lb (79.379 kg)  SpO2: 99%    General:  NAD   HEENT: normal Neck: supple. JVP 5-6 . Carotids 2+ bilaterally; no bruits. No lymphadenopathy or thryomegaly appreciated. Cor: PMI normal. Regular rate & rhythm. No rubs, or murmurs. No S3/S4 Lungs: clear Abdomen: soft, nontender, nondistended. No hepatosplenomegaly. No bruits or masses. Good bowel sounds. Extremities: no cyanosis, clubbing, rash. Lower extremities cool. No edema.  RUE PICC in place.  Neuro: alert & orientedx3, cranial nerves grossly intact. Moves all 4 extremities w/o difficulty. Affect pleasant.  ASSESSMENT & PLAN: 1. Chronic Systolic Heart Failure:  Nonischemic cardiomyopathy thought to potentially be due to viral myocarditis noted initially in 2011. Cath 2012 norm cororonaries.  Echo (7/15) with EF 5-10% and RV mod-severely dilated with moderately decreased systolic function.  Recently admitted with cardiogenic shock.   Completed Pre LVAD tests Blood Type- B+  Lower Extremity Doppler - Normal arterial flow Carotid Doppler-  1-39 percent stenosis involving the right internal carotid artery and the left internal carotid Artery. Had colonoscopy in 2013. CT on chest-06/06/2014 - ? R base opacities? Infections. Needs repeat 5 weeks.   PFTs: normal  -Doing well off milrinone. NYHA II. Will pull PICC today.  - Volume status looks good. Continue Lasix 40 mg daily.  Will need to follow creatinine closely.  - No ACEI/ARB due to recurrent episodes of angioedema.  - Will attempt to start low-dose carvedilol today 3.125 bid - Continue hydralazine 75 mg tid and Imdur to 90 mg daily.   - Continue digoxin 0.125 mg daily.  - She is very interested in advanced therapies to include VAD versus transplant.  Will refer to [facility]  transplant clinic, will need Medicaid coverage which is pending. Will schedule CPX and repeat echo.  - Reinforced daily weights, low salt diet and limiting fluid intake to < 2 liters per day.  - Followed by [facility]. Check BMET and dig level this week.  2. PAF: NSR today. No bleeding problems on eliquis 5 mg twice a day. Continue amiodarone, Recent LFTs/TSH normal.  Will need regular eye exams on amiodarone.  3. Angioedema: Had in past on ACEI, had another episode recently.  She is no longer on ACEI or ARB. Amiodarone can uncommonly cause angioedema but as she had angioedema also prior to amiodarone, suspect this is not the culprit.  ?Hereditary or acquired C1 inhibitor deficiency.   4. AKI: Creatinine improved,   continue current Lasix.   Followup in 3 wks.   Glori Bickers MD 07/31/2014 10:10 AM

## 2014-07-31 NOTE — Addendum Note (Signed)
Encounter addended by: Scarlette Calico, RN on: 07/31/2014 10:42 AM<BR>     Documentation filed: Orders

## 2014-07-31 NOTE — Patient Instructions (Signed)
Start Carvedilol 3.125 mg Twice daily   Labs today  Your physician has requested that you have an echocardiogram. Echocardiography is a painless test that uses sound waves to create images of your heart. It provides your doctor with information about the size and shape of your heart and how well your heart's chambers and valves are working. This procedure takes approximately one hour. There are no restrictions for this procedure.  Your physician has recommended that you have a cardiopulmonary stress test (CPX). CPX testing is a non-invasive measurement of heart and lung function. It replaces a traditional treadmill stress test. This type of test provides a tremendous amount of information that relates not only to your present condition but also for future outcomes. This test combines measurements of you ventilation, respiratory gas exchange in the lungs, electrocardiogram (EKG), blood pressure and physical response before, during, and following an exercise protocol.  Your physician recommends that you schedule a follow-up appointment in: about 3 weeks, after echo and cpx are complete

## 2014-07-31 NOTE — Addendum Note (Signed)
Encounter addended by: Scarlette Calico, RN on: 07/31/2014 11:08 AM<BR>     Documentation filed: Orders

## 2014-08-01 ENCOUNTER — Other Ambulatory Visit (HOSPITAL_COMMUNITY): Payer: Self-pay | Admitting: *Deleted

## 2014-08-01 DIAGNOSIS — I5023 Acute on chronic systolic (congestive) heart failure: Secondary | ICD-10-CM

## 2014-08-01 MED ORDER — HYDRALAZINE HCL 50 MG PO TABS: 75.0000 mg | ORAL_TABLET | Freq: Three times a day (TID) | ORAL | Status: DC

## 2014-08-04 ENCOUNTER — Encounter: Payer: Self-pay | Admitting: *Deleted

## 2014-08-09 ENCOUNTER — Ambulatory Visit (HOSPITAL_COMMUNITY)
Admission: RE | Admit: 2014-08-09 | Discharge: 2014-08-09 | Disposition: A | Discharge status: Home / Self Care | Payer: Medicaid Other | Source: Ambulatory Visit | Attending: Internal Medicine | Admitting: Internal Medicine

## 2014-08-09 DIAGNOSIS — I10 Essential (primary) hypertension: Secondary | ICD-10-CM | POA: Insufficient documentation

## 2014-08-09 DIAGNOSIS — I509 Heart failure, unspecified: Secondary | ICD-10-CM | POA: Insufficient documentation

## 2014-08-09 DIAGNOSIS — I059 Rheumatic mitral valve disease, unspecified: Secondary | ICD-10-CM

## 2014-08-09 DIAGNOSIS — I5022 Chronic systolic (congestive) heart failure: Secondary | ICD-10-CM

## 2014-08-09 NOTE — Progress Notes (Signed)
Echo Lab  2D Echocardiogram completed.  Ethen Bannan L Giovanie Lefebre, RDCS 08/09/2014 9:25 AM

## 2014-08-10 ENCOUNTER — Other Ambulatory Visit (HOSPITAL_COMMUNITY): Payer: Self-pay | Admitting: Cardiology

## 2014-08-10 DIAGNOSIS — I5023 Acute on chronic systolic (congestive) heart failure: Secondary | ICD-10-CM

## 2014-08-10 MED ORDER — POTASSIUM CHLORIDE CRYS ER 20 MEQ PO TBCR: 40.0000 meq | EXTENDED_RELEASE_TABLET | Freq: Every day | ORAL | Status: DC

## 2014-08-10 MED ORDER — DIGOXIN 125 MCG PO TABS: 0.1250 mg | ORAL_TABLET | Freq: Every day | ORAL | Status: DC

## 2014-08-10 MED ORDER — SPIRONOLACTONE 25 MG PO TABS: 25.0000 mg | ORAL_TABLET | Freq: Every day | ORAL | Status: DC

## 2014-08-10 MED ORDER — FUROSEMIDE 40 MG PO TABS: 40.0000 mg | ORAL_TABLET | Freq: Every day | ORAL | Status: DC

## 2014-08-10 MED ORDER — MAGNESIUM OXIDE -MG SUPPLEMENT 400 (240 MG) MG PO TABS: 1.0000 | ORAL_TABLET | Freq: Every day | ORAL | Status: DC

## 2014-08-23 ENCOUNTER — Ambulatory Visit (HOSPITAL_COMMUNITY)
Admission: RE | Admit: 2014-08-23 | Discharge: 2014-08-23 | Disposition: A | Discharge status: Home / Self Care | Payer: Medicaid Other | Source: Ambulatory Visit | Attending: Cardiology | Admitting: Cardiology

## 2014-08-23 ENCOUNTER — Encounter (HOSPITAL_COMMUNITY): Payer: Self-pay

## 2014-08-23 VITALS — BP 118/72 | HR 89 | Wt 172.0 lb

## 2014-08-23 DIAGNOSIS — I4892 Unspecified atrial flutter: Secondary | ICD-10-CM

## 2014-08-23 DIAGNOSIS — X58XXXA Exposure to other specified factors, initial encounter: Secondary | ICD-10-CM | POA: Diagnosis not present

## 2014-08-23 DIAGNOSIS — N179 Acute kidney failure, unspecified: Secondary | ICD-10-CM | POA: Diagnosis not present

## 2014-08-23 DIAGNOSIS — I429 Cardiomyopathy, unspecified: Secondary | ICD-10-CM | POA: Insufficient documentation

## 2014-08-23 DIAGNOSIS — I480 Paroxysmal atrial fibrillation: Secondary | ICD-10-CM | POA: Insufficient documentation

## 2014-08-23 DIAGNOSIS — T783XXA Angioneurotic edema, initial encounter: Secondary | ICD-10-CM | POA: Diagnosis not present

## 2014-08-23 DIAGNOSIS — Z95810 Presence of automatic (implantable) cardiac defibrillator: Secondary | ICD-10-CM | POA: Insufficient documentation

## 2014-08-23 DIAGNOSIS — I10 Essential (primary) hypertension: Secondary | ICD-10-CM | POA: Diagnosis not present

## 2014-08-23 DIAGNOSIS — I5022 Chronic systolic (congestive) heart failure: Secondary | ICD-10-CM | POA: Insufficient documentation

## 2014-08-23 DIAGNOSIS — Z7901 Long term (current) use of anticoagulants: Secondary | ICD-10-CM | POA: Diagnosis not present

## 2014-08-23 LAB — BASIC METABOLIC PANEL
Anion gap: 13 (ref 5–15)
BUN: 25 mg/dL — ABNORMAL HIGH (ref 6–23)
CO2: 25 mEq/L (ref 19–32)
Calcium: 9.7 mg/dL (ref 8.4–10.5)
Chloride: 102 mEq/L (ref 96–112)
Creatinine, Ser: 1.51 mg/dL — ABNORMAL HIGH (ref 0.50–1.10)
GFR calc non Af Amer: 38 mL/min — ABNORMAL LOW (ref >90)
GFR, EST AFRICAN AMERICAN: 44 mL/min — AB (ref >90)
Glucose, Bld: 109 mg/dL — ABNORMAL HIGH (ref 70–99)
POTASSIUM: 4.4 meq/L (ref 3.7–5.3)
SODIUM: 140 meq/L (ref 137–147)

## 2014-08-23 LAB — DIGOXIN LEVEL: Digoxin Level: 0.7 ng/mL — ABNORMAL LOW (ref 0.8–2.0)

## 2014-08-23 MED ORDER — CARVEDILOL 6.25 MG PO TABS: 6.2500 mg | ORAL_TABLET | Freq: Two times a day (BID) | ORAL | Status: DC

## 2014-08-23 NOTE — Progress Notes (Signed)
Patient ID: Anita Warren, female   DOB: [birth year], 53 y.o.   MRN: [id number] Primary Physician: Glo Herring., MD  Primary Cardiologist: Carlyle Dolly MD [facility])   HPI: Ms. Folden is a 53 y.o.female with history of viral, non-ischemic cardiomyopathy, severe systolic dysfunction with EF of 10% s/p ICD pacemaker in the setting of VT 02/2011 ([place] VR model CD1231-40Q (serial [id number]);  Most recent cath 2012 with no CAD.   Admitted July 28 th with acute on chronic systolic CHF she was not taking medications for 4 months because she lost her insurance. She was diuresed with IV lasix and transitioned to lasix 40 mg daily. She was also discharged on 12.5 mg carvedilol twice a day, hydralazine 25 mg tid, and 30 mg Imdur daily. Discharge weight was 176 pounds. She was not on  an Ace due to angioedema.  Admitted from HF clinic 8/715 with low output heart failure. Swan placed and showed cardiogenic shock. Ultimately discharged on  Milrinone 0.25 mcg . Hospital stay was complicated by atrial flutter. Loaded on amiodarone and started on eliquis 5 mg twice a day. Met with Dr Prescott Gum and VAD coordinator.   She returns for follow up.  Milrinone stopped 07/17/14. Started carvedilol 3.125 bid at last visit. Tolerating well. Continues to feel great. Denies dyspnea. Walks 20 mins at a time without problem.  Does all ADLs without any problems. No orthopnea or PND. Weight stable. No bleeding problems. Taking all medications. Disability and Medicaid still pending.  She is no longer taking ACEI or ARB because of recurrent angioedema.  Maintainining NSR on amiodarone.    05/23/14 ECHO EF 5-10% RV mod to severely dilated.  08/09/14 ECHO EF 20-25% RV read as normal  ECG: NSR, septal Qs, lateral TWIs (narrow QRS)  Labs:  HIV negative 2015  9/15: K 3.6 => 4.6, creatinine 0.99 => 1.5 => 1.2, Mg 1.9, HCT 45.2, LFTs normal, TSH normal 9/15: PFTs normal 07/31/14: Co-ox off milrinone 66%  K  4.5 Cr 1.36  2013 Colonoscopy Blood Type:  B+  Lower Extremity Doppler - Normal arterial flow Carotid Doppler-  1-39% stenosis involving the right internal carotid artery and the left internal carotid Artery.  SH: Disabled  Lives with husband and son. Has 3 grown children. Does not smoke or drink alcohol.. 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    FH: Mom died at 1 CAD         Sister and Brother HTN    ROS: All systems negative except as listed in HPI, PMH and Problem List.  Past Medical History  Diagnosis Date  . Nonischemic cardiomyopathy     LVEF 5-10%, likely viral (no CAD by cath 01/30/11)  . Essential hypertension, benign   . NSVT (nonsustained ventricular tachycardia)   . Obesity   . Hydronephrosis with renal and ureteral calculus obstruction 09/05/2013  . Chronic systolic heart failure   . History of medication noncompliance     Current Outpatient Prescriptions  Medication Sig Dispense Refill  . acetaminophen (TYLENOL) 500 MG tablet Take 1,000 mg by mouth every 6 (six) hours as needed for moderate pain.      Marland Kitchen amiodarone (PACERONE) 200 MG tablet Take 1 tablet (200 mg total) by mouth daily.  30 tablet  3  . apixaban (ELIQUIS) 5 MG TABS tablet Take 1 tablet (5 mg total) by mouth 2 (two) times daily.  60 tablet  3  . carvedilol (COREG) 3.125 MG tablet Take 1 tablet (3.125 mg total)  by mouth 2 (two) times daily.  60 tablet  3  . Coenzyme Q10 (CO Q 10) 100 MG CAPS Take 100 mg by mouth daily.       . digoxin (LANOXIN) 0.125 MG tablet Take 1 tablet (0.125 mg total) by mouth daily.  90 tablet  3  . furosemide (LASIX) 40 MG tablet Take 1 tablet (40 mg total) by mouth daily.  90 tablet  3  . hydrALAZINE (APRESOLINE) 50 MG tablet Take 1.5 tablets (75 mg total) by mouth 3 (three) times daily.  135 tablet  3  . isosorbide mononitrate (IMDUR) 60 MG 24 hr tablet Take 1.5 tablets (90 mg total) by mouth daily.  30 tablet  3  . Magnesium Oxide 400 (240 MG) MG TABS Take 1 tablet by mouth daily.   90 tablet  3  . potassium chloride SA (K-DUR,KLOR-CON) 20 MEQ tablet Take 2 tablets (40 mEq total) by mouth daily.  180 tablet  3  . spironolactone (ALDACTONE) 25 MG tablet Take 1 tablet (25 mg total) by mouth daily.  90 tablet  3   No current facility-administered medications for this encounter.     PHYSICAL EXAM: Filed Vitals:   08/23/14 0904  BP: 118/72  Pulse: 89  Weight: 172 lb (78.019 kg)  SpO2: 95%    General:  NAD   HEENT: normal Neck: supple. JVP 5-6 . Carotids 2+ bilaterally; no bruits. No lymphadenopathy or thryomegaly appreciated. Cor: PMI normal. Regular rate & rhythm. No rubs, or murmurs. No S3/S4 Lungs: clear Abdomen: soft, nontender, nondistended. No hepatosplenomegaly. No bruits or masses. Good bowel sounds. Extremities: no cyanosis, clubbing, rash. Lower extremities cool. No edema.  RUE PICC in place.  Neuro: alert & orientedx3, cranial nerves grossly intact. Moves all 4 extremities w/o difficulty. Affect pleasant.  ASSESSMENT & PLAN: 1. Chronic Systolic Heart Failure:  Nonischemic cardiomyopathy thought to potentially be due to viral myocarditis noted initially in 2011. Cath 2012 norm cororonaries.  Echo (7/15) with EF 5-10% and RV mod-severely dilated with moderately decreased systolic function. Repeat echo 10/15 EF up to 20-25%. Now NYHA I-II  Completed Pre LVAD tests Blood Type- B+  Lower Extremity Doppler - Normal arterial flow Carotid Doppler-  1-39 percent stenosis involving the right internal carotid artery and the left internal carotid Artery. Had colonoscopy in 2013. CT on chest-06/06/2014 - ? R base opacities? Infections. Needs repeat 5 weeks.   PFTs: normal  - Doing well off milrinone. NYHA I-II.  EF seems to be improving.  - Volume status looks good. Continue Lasix 40 mg daily.  Will need to follow creatinine closely.  - No ACEI/ARB due to recurrent episodes of angioedema.  - Increase carvedilol to 6.25 bid - Continue hydralazine 75 mg tid and  Imdur to 90 mg daily.   - Continue digoxin 0.125 mg daily.  - CPX test scheduled for December - Reinforced daily weights, low salt diet and limiting fluid intake to < 2 liters per day.  - Check BMET today  2. PAF: NSR today. No bleeding problems on eliquis 5 mg twice a day. Continue amiodarone, Recent LFTs/TSH normal.  Will need regular eye exams on amiodarone.  3. Angioedema: Had in past on ACEI, had another episode recently.  She is no longer on ACEI or ARB.  ?Hereditary or acquired C1 inhibitor deficiency. No further problems with this  4. AKI: Creatinine improved, continue current Lasix. Check BMET  Followup in 3 wks.   Glori Bickers MD 08/23/2014 9:24 AM

## 2014-08-23 NOTE — Patient Instructions (Signed)
Increase Carvedilol to 6.25 mg Twice daily   Labs today  Your physician recommends that you schedule a follow-up appointment in: 1 month  

## 2014-08-23 NOTE — Addendum Note (Signed)
Encounter addended by: Scarlette Calico, RN on: 08/23/2014  9:44 AM<BR>     Documentation filed: Patient Instructions Section, Orders

## 2014-08-28 ENCOUNTER — Telehealth (HOSPITAL_COMMUNITY): Payer: Self-pay | Admitting: *Deleted

## 2014-08-28 DIAGNOSIS — I5023 Acute on chronic systolic (congestive) heart failure: Secondary | ICD-10-CM

## 2014-08-28 MED ORDER — FUROSEMIDE 40 MG PO TABS: 40.0000 mg | ORAL_TABLET | ORAL | Status: DC

## 2014-08-28 NOTE — Telephone Encounter (Signed)
Pt aware, order faxed to [facility]

## 2014-08-28 NOTE — Telephone Encounter (Signed)
-----   Message from Jolaine Artist, MD sent at 08/27/2014  5:59 PM EST ----- Cr rising. Lets change lasix to every other day. Repeat BMET and BNP 1 week.

## 2014-09-06 ENCOUNTER — Encounter: Payer: Self-pay | Admitting: *Deleted

## 2014-09-20 ENCOUNTER — Encounter (HOSPITAL_COMMUNITY): Payer: Self-pay

## 2014-09-20 ENCOUNTER — Ambulatory Visit (HOSPITAL_COMMUNITY)
Admission: RE | Admit: 2014-09-20 | Discharge: 2014-09-20 | Disposition: A | Discharge status: Home / Self Care | Payer: Medicaid Other | Source: Ambulatory Visit | Attending: Internal Medicine | Admitting: Internal Medicine

## 2014-09-20 VITALS — BP 112/62 | HR 64 | Resp 18 | Wt 168.0 lb

## 2014-09-20 DIAGNOSIS — I480 Paroxysmal atrial fibrillation: Secondary | ICD-10-CM | POA: Diagnosis not present

## 2014-09-20 DIAGNOSIS — D841 Defects in the complement system: Secondary | ICD-10-CM | POA: Diagnosis not present

## 2014-09-20 DIAGNOSIS — I5022 Chronic systolic (congestive) heart failure: Secondary | ICD-10-CM | POA: Insufficient documentation

## 2014-09-20 DIAGNOSIS — N179 Acute kidney failure, unspecified: Secondary | ICD-10-CM | POA: Diagnosis not present

## 2014-09-20 DIAGNOSIS — N183 Chronic kidney disease, stage 3 unspecified: Secondary | ICD-10-CM | POA: Insufficient documentation

## 2014-09-20 LAB — BASIC METABOLIC PANEL
ANION GAP: 13 (ref 5–15)
BUN: 28 mg/dL — ABNORMAL HIGH (ref 6–23)
CALCIUM: 10.1 mg/dL (ref 8.4–10.5)
CO2: 24 mEq/L (ref 19–32)
Chloride: 99 mEq/L (ref 96–112)
Creatinine, Ser: 1.64 mg/dL — ABNORMAL HIGH (ref 0.50–1.10)
GFR, EST AFRICAN AMERICAN: 40 mL/min — AB (ref >90)
GFR, EST NON AFRICAN AMERICAN: 35 mL/min — AB (ref >90)
GLUCOSE: 99 mg/dL (ref 70–99)
POTASSIUM: 4.9 meq/L (ref 3.7–5.3)
SODIUM: 136 meq/L — AB (ref 137–147)

## 2014-09-20 MED ORDER — CARVEDILOL 6.25 MG PO TABS: 9.3750 mg | ORAL_TABLET | Freq: Two times a day (BID) | ORAL | Status: DC

## 2014-09-20 NOTE — Patient Instructions (Signed)
Increase Carvedilol to 9.375 mg (1 & 1/2 tabs) Twice daily   Lab today  Your physician recommends that you schedule a follow-up appointment in: 1 month

## 2014-09-20 NOTE — Progress Notes (Signed)
Patient ID: Anita Warren, female   DOB: [birth year], 53 y.o.   MRN: [id number] Primary Physician: Glo Herring., MD  Primary Cardiologist: Carlyle Dolly MD [facility])   HPI: Anita Warren is a 53 y.o.female with history of viral, non-ischemic cardiomyopathy, severe systolic dysfunction with EF of 10% s/p ICD pacemaker in the setting of VT 02/2011 ([place] VR model CD1231-40Q (serial [id number]);  Most recent cath 2012 with no CAD.   Admitted July 28 th with acute on chronic systolic CHF she was not taking medications for 4 months because she lost her insurance. She was diuresed with IV lasix and transitioned to lasix 40 mg daily. She was also discharged on 12.5 mg carvedilol twice a day, hydralazine 25 mg tid, and 30 mg Imdur daily. Discharge weight was 176 pounds. She was not on  an Ace due to angioedema.  Admitted from HF clinic 8/715 with low output heart failure. Swan placed and showed cardiogenic shock. Ultimately discharged on  Milrinone 0.25 mcg . Hospital stay was complicated by atrial flutter. Loaded on amiodarone and started on eliquis 5 mg twice a day. Met with Dr Prescott Gum and VAD coordinator.   She returns for follow up.  Milrinone stopped 07/17/14. At last visit carvedilol increased to 6.25 bid. Tolerating well. Continues to feel great. Denies dyspnea. Walks 20 mins at a time without problem.  Does all ADLs without any problems. No orthopnea or PND. Weight down 4 pounds. No bleeding problems. Taking all medications. Disability approved. Medicaid still pending.   She is no longer taking ACEI or ARB because of recurrent angioedema.  Maintainining NSR on amiodarone.    05/23/14 ECHO EF 5-10% RV mod to severely dilated.  08/09/14 ECHO EF 20-25% RV read as normal  ECG: NSR, septal Qs, lateral TWIs (narrow QRS)  Labs:  HIV negative 2015  9/15: K 3.6 => 4.6, creatinine 0.99 => 1.5 => 1.2, Mg 1.9, HCT 45.2, LFTs normal, TSH normal 9/15: PFTs normal 07/31/14: Co-ox off  milrinone 66%  K 4.5 Cr 1.36 08/23/14: k 4.4 cr 1.51 digoxin 0.7  2013 Colonoscopy Blood Type:  B+  Lower Extremity Doppler - Normal arterial flow Carotid Doppler-  1-39% stenosis involving the right internal carotid artery and the left internal carotid Artery.  SH: Disabled  Lives with husband and son. Has 3 grown children. Does not smoke or drink alcohol.. 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    FH: Mom died at 24 CAD         Sister and Brother HTN    ROS: All systems negative except as listed in HPI, PMH and Problem List.  Past Medical History  Diagnosis Date  . Nonischemic cardiomyopathy     LVEF 5-10%, likely viral (no CAD by cath 01/30/11)  . Essential hypertension, benign   . NSVT (nonsustained ventricular tachycardia)   . Obesity   . Hydronephrosis with renal and ureteral calculus obstruction 09/05/2013  . Chronic systolic heart failure   . History of medication noncompliance     Current Outpatient Prescriptions  Medication Sig Dispense Refill  . acetaminophen (TYLENOL) 500 MG tablet Take 1,000 mg by mouth every 6 (six) hours as needed for moderate pain.    Marland Kitchen amiodarone (PACERONE) 200 MG tablet Take 1 tablet (200 mg total) by mouth daily. 30 tablet 3  . apixaban (ELIQUIS) 5 MG TABS tablet Take 1 tablet (5 mg total) by mouth 2 (two) times daily. 60 tablet 3  . carvedilol (COREG) 6.25 MG tablet Take  1 tablet (6.25 mg total) by mouth 2 (two) times daily. 60 tablet 3  . Coenzyme Q10 (CO Q 10) 100 MG CAPS Take 100 mg by mouth daily.     . digoxin (LANOXIN) 0.125 MG tablet Take 1 tablet (0.125 mg total) by mouth daily. 90 tablet 3  . furosemide (LASIX) 40 MG tablet Take 1 tablet (40 mg total) by mouth every other day. 90 tablet 3  . hydrALAZINE (APRESOLINE) 50 MG tablet Take 1.5 tablets (75 mg total) by mouth 3 (three) times daily. 135 tablet 3  . isosorbide mononitrate (IMDUR) 60 MG 24 hr tablet Take 1.5 tablets (90 mg total) by mouth daily. 30 tablet 3  . Magnesium Oxide 400 (240  MG) MG TABS Take 1 tablet by mouth daily. 90 tablet 3  . potassium chloride SA (K-DUR,KLOR-CON) 20 MEQ tablet Take 2 tablets (40 mEq total) by mouth daily. 180 tablet 3  . spironolactone (ALDACTONE) 25 MG tablet Take 1 tablet (25 mg total) by mouth daily. 90 tablet 3   No current facility-administered medications for this encounter.     PHYSICAL EXAM: Filed Vitals:   09/20/14 1410  BP: 112/62  Pulse: 64  Resp: 18  Weight: 168 lb (76.204 kg)  SpO2: 99%    General:  NAD   HEENT: normal Neck: supple. JVP 5-6 . Carotids 2+ bilaterally; no bruits. No lymphadenopathy or thryomegaly appreciated. Cor: PMI normal. Regular rate & rhythm. No rubs, or murmurs. No S3/S4 Lungs: clear Abdomen: soft, nontender, nondistended. No hepatosplenomegaly. No bruits or masses. Good bowel sounds. Extremities: no cyanosis, clubbing, rash. Lower extremities cool. No edema.  RUE PICC in place.  Neuro: alert & orientedx3, cranial nerves grossly intact. Moves all 4 extremities w/o difficulty. Affect pleasant.  ASSESSMENT & PLAN: 1. Chronic Systolic Heart Failure:  Nonischemic cardiomyopathy thought to potentially be due to viral myocarditis noted initially in 2011. Cath 2012 norm cororonaries.  Echo (7/15) with EF 5-10% and RV mod-severely dilated with moderately decreased systolic function. Repeat echo 10/15 EF up to 20-25%. Now NYHA I-II  Completed Pre LVAD tests Blood Type- B+  Lower Extremity Doppler - Normal arterial flow Carotid Doppler-  1-39 percent stenosis involving the right internal carotid artery and the left internal carotid Artery. Had colonoscopy in 2013. CT on chest-06/06/2014 - ? R base opacities? Infections. Needs repeat 5 weeks.   PFTs: normal  - Doing well off milrinone. NYHA I-II.  EF seems to be improving.  - Volume status looks good. Continue Lasix 40 mg daily.   - No ACEI/ARB due to recurrent episodes of angioedema.  - Increase carvedilol to 9.375 bid - Continue hydralazine 75 mg  tid and Imdur to 90 mg daily.   - Continue digoxin 0.125 mg daily.  - CPX test scheduled for December 11 - Reinforced daily weights, low salt diet and limiting fluid intake to < 2 liters per day.  - Check BMET today  2. PAF: NSR today. No bleeding problems on eliquis 5 mg twice a day. Continue amiodarone, Recent LFTs/TSH normal.  Will need regular eye exams on amiodarone.  3. Angioedema: Had in past on ACEI, had another episode recently.  She is no longer on ACEI or ARB.  ?Hereditary or acquired C1 inhibitor deficiency. No further problems with this  4. AKI: Creatinine up slightly, continue current Lasix. Check BMET  Followup in 4 wks.   Glori Bickers MD 09/20/2014 2:49 PM

## 2014-09-20 NOTE — Addendum Note (Signed)
Encounter addended by: Scarlette Calico, RN on: 09/20/2014  2:59 PM<BR>     Documentation filed: Dx [employer], Patient Instructions Section, Orders

## 2014-09-26 ENCOUNTER — Telehealth (HOSPITAL_COMMUNITY): Payer: Self-pay | Admitting: *Deleted

## 2014-09-26 DIAGNOSIS — I5023 Acute on chronic systolic (congestive) heart failure: Secondary | ICD-10-CM

## 2014-09-26 MED ORDER — FUROSEMIDE 40 MG PO TABS: 40.0000 mg | ORAL_TABLET | ORAL | Status: DC

## 2014-09-26 NOTE — Telephone Encounter (Signed)
Pt aware.

## 2014-09-26 NOTE — Telephone Encounter (Signed)
-----   Message from Jolaine Artist, MD sent at 09/25/2014  9:08 AM EST ----- Creatinine continues to climb. Change lasix to 40 mg Mon and Fri oy. Can take extra as needed for swelling.  Repeat BMET next week.

## 2014-10-05 ENCOUNTER — Encounter: Payer: Self-pay | Admitting: Internal Medicine

## 2014-10-06 ENCOUNTER — Encounter (HOSPITAL_COMMUNITY): Payer: Self-pay

## 2014-10-18 ENCOUNTER — Other Ambulatory Visit: Payer: Self-pay | Admitting: Cardiology

## 2014-10-25 ENCOUNTER — Other Ambulatory Visit: Payer: Self-pay | Admitting: Cardiology

## 2014-10-25 ENCOUNTER — Ambulatory Visit (HOSPITAL_COMMUNITY)
Admission: RE | Admit: 2014-10-25 | Discharge: 2014-10-25 | Disposition: A | Discharge status: Home / Self Care | Payer: Medicaid Other | Source: Ambulatory Visit | Attending: Cardiology | Admitting: Cardiology

## 2014-10-25 VITALS — BP 139/74 | HR 61 | Resp 18 | Wt 175.2 lb

## 2014-10-25 DIAGNOSIS — Z95810 Presence of automatic (implantable) cardiac defibrillator: Secondary | ICD-10-CM | POA: Insufficient documentation

## 2014-10-25 DIAGNOSIS — T783XXD Angioneurotic edema, subsequent encounter: Secondary | ICD-10-CM | POA: Diagnosis not present

## 2014-10-25 DIAGNOSIS — I4892 Unspecified atrial flutter: Secondary | ICD-10-CM

## 2014-10-25 DIAGNOSIS — Z79899 Other long term (current) drug therapy: Secondary | ICD-10-CM | POA: Insufficient documentation

## 2014-10-25 DIAGNOSIS — Z7901 Long term (current) use of anticoagulants: Secondary | ICD-10-CM | POA: Diagnosis not present

## 2014-10-25 DIAGNOSIS — I429 Cardiomyopathy, unspecified: Secondary | ICD-10-CM | POA: Diagnosis not present

## 2014-10-25 DIAGNOSIS — I480 Paroxysmal atrial fibrillation: Secondary | ICD-10-CM | POA: Insufficient documentation

## 2014-10-25 DIAGNOSIS — Z950 Presence of cardiac pacemaker: Secondary | ICD-10-CM | POA: Insufficient documentation

## 2014-10-25 DIAGNOSIS — N179 Acute kidney failure, unspecified: Secondary | ICD-10-CM | POA: Diagnosis not present

## 2014-10-25 DIAGNOSIS — I5022 Chronic systolic (congestive) heart failure: Secondary | ICD-10-CM | POA: Insufficient documentation

## 2014-10-25 DIAGNOSIS — I5023 Acute on chronic systolic (congestive) heart failure: Secondary | ICD-10-CM

## 2014-10-25 DIAGNOSIS — I10 Essential (primary) hypertension: Secondary | ICD-10-CM | POA: Insufficient documentation

## 2014-10-25 DIAGNOSIS — N183 Chronic kidney disease, stage 3 unspecified: Secondary | ICD-10-CM

## 2014-10-25 DIAGNOSIS — E669 Obesity, unspecified: Secondary | ICD-10-CM | POA: Diagnosis not present

## 2014-10-25 DIAGNOSIS — Z87898 Personal history of other specified conditions: Secondary | ICD-10-CM

## 2014-10-25 LAB — BASIC METABOLIC PANEL
ANION GAP: 5 (ref 5–15)
BUN: 20 mg/dL (ref 6–23)
CHLORIDE: 107 meq/L (ref 96–112)
CO2: 25 mmol/L (ref 19–32)
Calcium: 9.4 mg/dL (ref 8.4–10.5)
Creatinine, Ser: 1.36 mg/dL — ABNORMAL HIGH (ref 0.50–1.10)
GFR, EST AFRICAN AMERICAN: 50 mL/min — AB (ref >90)
GFR, EST NON AFRICAN AMERICAN: 44 mL/min — AB (ref >90)
Glucose, Bld: 102 mg/dL — ABNORMAL HIGH (ref 70–99)
POTASSIUM: 4.4 mmol/L (ref 3.5–5.1)
Sodium: 137 mmol/L (ref 135–145)

## 2014-10-25 MED ORDER — DIGOXIN 125 MCG PO TABS: 0.1250 mg | ORAL_TABLET | Freq: Every day | ORAL | Status: DC

## 2014-10-25 MED ORDER — HYDRALAZINE HCL 100 MG PO TABS: 100.0000 mg | ORAL_TABLET | Freq: Three times a day (TID) | ORAL | Status: DC

## 2014-10-25 NOTE — Patient Instructions (Signed)
INCREASE Hydralazine to 100mg  three times daily. Can take 2 tablets of current Rx three times daily until you run out, new Rx will be 100mg  tablets (then take 1 tablet three times daily).  We will schedule you for a cardiopulmonary stress test.  Follow up 6 weeks.  Happy New Year!  Do the following things EVERYDAY: 1) Weigh yourself in the morning before breakfast. Write it down and keep it in a log. 2) Take your medicines as prescribed 3) Eat low salt foods-Limit salt (sodium) to 2000 mg per day.  4) Stay as active as you can everyday 5) Limit all fluids for the day to less than 2 liters

## 2014-10-25 NOTE — Progress Notes (Signed)
Patient ID: Anita Warren, female   DOB: 02/03/1961, 53 y.o.   MRN: [id number] Primary Physician: Glo Herring., MD  Primary Cardiologist: Carlyle Dolly MD [facility]) EP: Dr Rayann Heman   HPI: Anita Warren is a 54 y.o.female with history of viral, non-ischemic cardiomyopathy, severe systolic dysfunction with EF of 10% s/p ICD pacemaker in the setting of VT 02/2011 ([place] VR model CD1231-40Q (serial [phone]);  Most recent cath 2012 with no CAD.   Admitted July 28 th with acute on chronic systolic CHF she was not taking medications for 4 months because she lost her insurance. She was diuresed with IV lasix and transitioned to lasix 40 mg daily. She was also discharged on 12.5 mg carvedilol twice a day, hydralazine 25 mg tid, and 30 mg Imdur daily. Discharge weight was 176 pounds. She was not on  an Ace due to angioedema.  Admitted from HF clinic 8/715 with low output heart failure. Swan placed and showed cardiogenic shock. Ultimately discharged on  Milrinone 0.25 mcg . Hospital stay was complicated by atrial flutter. Loaded on amiodarone and started on eliquis 5 mg twice a day. Met with Dr Prescott Gum and VAD coordinator.   She returns for follow up.  Last visit carvedilol was increased to 9.375 twice a day and lasix was switched to twice weekly.Overall feeling good. Denies SOB/PND/Orthopnea. Weight at home 170 pounds. BP at home 120-130s. No lower extremity edema. No bleeding problems. Taking all medications. Disability approved. Medicaid still pending.   She is no longer taking ACEI or ARB because of recurrent angioedema.    05/23/14 ECHO EF 5-10% RV mod to severely dilated.  08/09/14 ECHO EF 20-25% RV read as normal  Labs:  HIV negative 2015  9/15: K 3.6 => 4.6, creatinine 0.99 => 1.5 => 1.2, Mg 1.9, HCT 45.2, LFTs normal, TSH normal 9/15: PFTs normal 07/31/14: Co-ox off milrinone 66%  K 4.5 Cr 1.36 08/23/14: k 4.4 cr 1.51 digoxin 0.7 09/20/14: K 4.9 Creatinine 1.64    2013 Colonoscopy Blood Type:  B+  Lower Extremity Doppler - Normal arterial flow Carotid Doppler-  1-39% stenosis involving the right internal carotid artery and the left internal carotid Artery.  SH: Disabled  Lives with husband and son. Has 3 grown children. Does not smoke or drink alcohol.. 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    FH: Mom died at 90 CAD         Sister and Brother HTN    ROS: All systems negative except as listed in HPI, PMH and Problem List.  Past Medical History  Diagnosis Date  . Nonischemic cardiomyopathy     LVEF 5-10%, likely viral (no CAD by cath 01/30/11)  . Essential hypertension, benign   . NSVT (nonsustained ventricular tachycardia)   . Obesity   . Hydronephrosis with renal and ureteral calculus obstruction 09/05/2013  . Chronic systolic heart failure   . History of medication noncompliance     Current Outpatient Prescriptions  Medication Sig Dispense Refill  . acetaminophen (TYLENOL) 500 MG tablet Take 1,000 mg by mouth every 6 (six) hours as needed for moderate pain.    Marland Kitchen amiodarone (PACERONE) 200 MG tablet Take 1 tablet (200 mg total) by mouth daily. 30 tablet 3  . apixaban (ELIQUIS) 5 MG TABS tablet Take 1 tablet (5 mg total) by mouth 2 (two) times daily. 60 tablet 3  . carvedilol (COREG) 6.25 MG tablet Take 1.5 tablets (9.375 mg total) by mouth 2 (two) times daily. 60 tablet  3  . Coenzyme Q10 (CO Q 10) 100 MG CAPS Take 100 mg by mouth daily.     . digoxin (LANOXIN) 0.125 MG tablet Take 1 tablet (0.125 mg total) by mouth daily. 30 tablet 3  . furosemide (LASIX) 40 MG tablet Take 1 tablet (40 mg total) by mouth 2 (two) times a week. Every Mon and Fri 90 tablet 3  . hydrALAZINE (APRESOLINE) 50 MG tablet Take 1.5 tablets (75 mg total) by mouth 3 (three) times daily. 135 tablet 3  . isosorbide mononitrate (IMDUR) 60 MG 24 hr tablet Take 1.5 tablets (90 mg total) by mouth daily. 30 tablet 3  . Magnesium Oxide 400 (240 MG) MG TABS Take 1 tablet by mouth  daily. 90 tablet 3  . potassium chloride SA (K-DUR,KLOR-CON) 20 MEQ tablet Take 2 tablets (40 mEq total) by mouth daily. 180 tablet 3  . spironolactone (ALDACTONE) 25 MG tablet Take 1 tablet (25 mg total) by mouth daily. 90 tablet 3   No current facility-administered medications for this encounter.     PHYSICAL EXAM: Filed Vitals:   10/25/14 1127  BP: 139/74  Pulse: 61  Resp: 18  Weight: 175 lb 4 oz (79.493 kg)  SpO2: 100%    General:  NAD   HEENT: normal Neck: supple. JVP 5-6 . Carotids 2+ bilaterally; no bruits. No lymphadenopathy or thryomegaly appreciated. Cor: PMI normal. Regular rate & rhythm. No rubs, or murmurs. No S3/S4 Lungs: clear Abdomen: soft, nontender, nondistended. No hepatosplenomegaly. No bruits or masses. Good bowel sounds. Extremities: no cyanosis, clubbing, rash. Lower extremities cool. No edema.  Neuro: alert & orientedx3, cranial nerves grossly intact. Moves all 4 extremities w/o difficulty. Affect pleasant.  ASSESSMENT & PLAN: 1. Chronic Systolic Heart Failure: Has [employer] ICD 2012,  Nonischemic cardiomyopathy thought to potentially be due to viral myocarditis noted initially in 2011. Cath 2012 norm cororonaries.  Echo (7/15) with EF 5-10% and RV mod-severely dilated with moderately decreased systolic function. Repeat echo 10/15 EF up to 20-25%. Now NYHA I-II - Remains stable off  milrinone. NYHA I-II.  EF seems to be improving.  - Volume status looks good. Continue Lasix 40 mg twice weekly. Continue spironolactone 25 mg daily   - No ACEI/ARB due to recurrent episodes of angioedema.  - Continue carvedilol to 9.375 bid will not increase as heart rate 61  - Increase hydralazine 100 mg tid and Imdur to 90 mg daily.   - Continue digoxin 0.125 mg daily last dig level 0.7 - CPX test to be scheduled today.  - Reinforced daily weights, low salt diet and limiting fluid intake to < 2 liters per day.  - Check BMET today  Completed Pre LVAD tests Blood Type- B+   Lower Extremity Doppler - Normal arterial flow Carotid Doppler-  1-39 percent stenosis involving the right internal carotid artery and the left internal carotid Artery. Had colonoscopy in 2013. CT on chest-06/06/2014 - ? R base opacities? Infections. Needs repeat 5 weeks.   PFTs: normal  2. PAF: Regular rate. No bleeding problems on eliquis 5 mg twice a day. Continue amiodarone, Recent 06/2014 LFTs/TSH normal.  Will need regular eye exams on amiodarone.  3. Angioedema: Had in past on ACEI, had another episode recently.  She is no longer on ACEI or ARB.  ?Hereditary or acquired C1 inhibitor deficiency. No further problems with this  4. AKI: Creatinine back down. Continue spiro and lasix at current dose.   BMET today Creatinine 1.3 K 4.4  Follow  up in 6 wks to discuss CPX.   CLEGG,AMY NP-C  10/25/2014 11:40 AM

## 2014-11-08 ENCOUNTER — Encounter (HOSPITAL_COMMUNITY): Payer: Self-pay

## 2014-11-10 ENCOUNTER — Other Ambulatory Visit: Payer: Self-pay | Admitting: Cardiology

## 2014-11-15 ENCOUNTER — Other Ambulatory Visit (HOSPITAL_COMMUNITY): Payer: Self-pay | Admitting: Cardiology

## 2014-11-15 DIAGNOSIS — I5023 Acute on chronic systolic (congestive) heart failure: Secondary | ICD-10-CM

## 2014-11-15 MED ORDER — ISOSORBIDE MONONITRATE ER 60 MG PO TB24: 90.0000 mg | ORAL_TABLET | Freq: Every day | ORAL | Status: DC

## 2014-11-15 MED ORDER — AMIODARONE HCL 200 MG PO TABS: 200.0000 mg | ORAL_TABLET | Freq: Every day | ORAL | Status: DC

## 2014-11-15 MED ORDER — ISOSORBIDE MONONITRATE ER 30 MG PO TB24: ORAL_TABLET | ORAL | Status: DC

## 2014-12-07 ENCOUNTER — Ambulatory Visit (HOSPITAL_COMMUNITY)
Admission: RE | Admit: 2014-12-07 | Discharge: 2014-12-07 | Disposition: A | Discharge status: Home / Self Care | Payer: Medicaid Other | Source: Ambulatory Visit | Attending: Cardiology | Admitting: Cardiology

## 2014-12-07 ENCOUNTER — Telehealth (HOSPITAL_COMMUNITY): Payer: Self-pay | Admitting: Vascular Surgery

## 2014-12-07 VITALS — BP 148/80 | HR 83 | Wt 172.0 lb

## 2014-12-07 DIAGNOSIS — J019 Acute sinusitis, unspecified: Secondary | ICD-10-CM | POA: Insufficient documentation

## 2014-12-07 DIAGNOSIS — N189 Chronic kidney disease, unspecified: Secondary | ICD-10-CM | POA: Insufficient documentation

## 2014-12-07 DIAGNOSIS — I5022 Chronic systolic (congestive) heart failure: Secondary | ICD-10-CM | POA: Insufficient documentation

## 2014-12-07 DIAGNOSIS — Z95810 Presence of automatic (implantable) cardiac defibrillator: Secondary | ICD-10-CM | POA: Insufficient documentation

## 2014-12-07 DIAGNOSIS — T783XXA Angioneurotic edema, initial encounter: Secondary | ICD-10-CM | POA: Insufficient documentation

## 2014-12-07 DIAGNOSIS — I429 Cardiomyopathy, unspecified: Secondary | ICD-10-CM | POA: Insufficient documentation

## 2014-12-07 DIAGNOSIS — Z7901 Long term (current) use of anticoagulants: Secondary | ICD-10-CM | POA: Insufficient documentation

## 2014-12-07 DIAGNOSIS — I129 Hypertensive chronic kidney disease with stage 1 through stage 4 chronic kidney disease, or unspecified chronic kidney disease: Secondary | ICD-10-CM | POA: Insufficient documentation

## 2014-12-07 DIAGNOSIS — I480 Paroxysmal atrial fibrillation: Secondary | ICD-10-CM | POA: Insufficient documentation

## 2014-12-07 LAB — CBC
HCT: 33.9 % — ABNORMAL LOW (ref 36.0–46.0)
HEMOGLOBIN: 11.4 g/dL — AB (ref 12.0–15.0)
MCH: 32.4 pg (ref 26.0–34.0)
MCHC: 33.6 g/dL (ref 30.0–36.0)
MCV: 96.3 fL (ref 78.0–100.0)
Platelets: 223 10*3/uL (ref 150–400)
RBC: 3.52 MIL/uL — AB (ref 3.87–5.11)
RDW: 13.9 % (ref 11.5–15.5)
WBC: 9 10*3/uL (ref 4.0–10.5)

## 2014-12-07 LAB — COMPREHENSIVE METABOLIC PANEL
ALT: 15 U/L (ref 0–35)
ANION GAP: 10 (ref 5–15)
AST: 18 U/L (ref 0–37)
Albumin: 3.7 g/dL (ref 3.5–5.2)
Alkaline Phosphatase: 48 U/L (ref 39–117)
BUN: 31 mg/dL — ABNORMAL HIGH (ref 6–23)
CO2: 22 mmol/L (ref 19–32)
CREATININE: 1.67 mg/dL — AB (ref 0.50–1.10)
Calcium: 9.3 mg/dL (ref 8.4–10.5)
Chloride: 105 mmol/L (ref 96–112)
GFR calc non Af Amer: 34 mL/min — ABNORMAL LOW (ref >90)
GFR, EST AFRICAN AMERICAN: 39 mL/min — AB (ref >90)
Glucose, Bld: 113 mg/dL — ABNORMAL HIGH (ref 70–99)
Potassium: 4.2 mmol/L (ref 3.5–5.1)
SODIUM: 137 mmol/L (ref 135–145)
Total Bilirubin: 0.4 mg/dL (ref 0.3–1.2)
Total Protein: 8.6 g/dL — ABNORMAL HIGH (ref 6.0–8.3)

## 2014-12-07 LAB — TSH: TSH: 1.919 u[IU]/mL (ref 0.350–4.500)

## 2014-12-07 MED ORDER — CARVEDILOL 12.5 MG PO TABS: 12.5000 mg | ORAL_TABLET | Freq: Two times a day (BID) | ORAL | Status: DC

## 2014-12-07 MED ORDER — AMOXICILLIN-POT CLAVULANATE 875-125 MG PO TABS: 1.0000 | ORAL_TABLET | Freq: Two times a day (BID) | ORAL | Status: DC

## 2014-12-07 NOTE — Telephone Encounter (Signed)
Pt called she cant afford the antibiotic she wants to know if there is something else she can take..please advise

## 2014-12-07 NOTE — Progress Notes (Signed)
Patient ID: Anita Warren, female   DOB: 01-29-1961, 54 y.o.   MRN: [id number] Primary Physician: Glo Herring., MD  Primary Cardiologist: Carlyle Dolly MD [facility]) EP: Dr Rayann Heman   HPI: Anita Warren is a 54 y.o.female with history of viral, non-ischemic cardiomyopathy, severe systolic dysfunction with EF of 10% s/p ICD pacemaker in the setting of VT 02/2011 ([place] VR model CD1231-40Q (serial [phone]);  Most recent cath 2012 with no CAD.   Admitted July 28 th with acute on chronic systolic CHF she was not taking medications for 4 months because she lost her insurance. She was diuresed with IV lasix and transitioned to lasix 40 mg daily. She was also discharged on 12.5 mg carvedilol twice a day, hydralazine 25 mg tid, and 30 mg Imdur daily. Discharge weight was 176 pounds. She was not on  an Ace due to angioedema.  Admitted from HF clinic 8/715 with low output heart failure. Swan placed and showed cardiogenic shock. Ultimately discharged on  Milrinone 0.25 mcg . Hospital stay was complicated by atrial flutter. Loaded on amiodarone and started on eliquis 5 mg twice a day. Met with Dr Prescott Gum and VAD coordinator. She was titrated off the milrinone.   Recently, she has been doing quite well.  No dyspnea unless she walks fast.  She can climb stairs with no problems.  No orthopnea/PND.  No lightheadedness or tachypalpitations.  Taking all meds.   She is no longer on ACEI or ARB because of recurrent angioedema.  She has had sinus congestion and yellow drainage for 3-4 weeks now, seems to be failing symptomatic treatment with coricidin and Robitussin.   05/23/14 ECHO EF 5-10% RV mod to severely dilated.  08/09/14 ECHO EF 20-25% RV read as normal  ECG: NSR with LVH  Labs:  HIV negative 2015  9/15: K 3.6 => 4.6, creatinine 0.99 => 1.5 => 1.2, Mg 1.9, HCT 45.2, LFTs normal, TSH normal 9/15: PFTs normal 07/31/14: Co-ox off milrinone 66%  K 4.5 Cr 1.36 08/23/14: k 4.4, cr  1.51 digoxin 0.7 09/20/14: K 4.9, creatinine 1.64  12/15: K 4.4, creatinine 1.36  2013 Colonoscopy Blood Type:  B+  Lower Extremity Doppler - Normal arterial flow Carotid Doppler-  1-39% stenosis involving the right internal carotid artery and the left internal carotid Artery.  SH: Disabled  Lives with husband and son. Has 3 grown children. Does not smoke or drink alcohol.. 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    FH: Mom died at 63 CAD         Sister and Brother HTN    ROS: All systems negative except as listed in HPI, PMH and Problem List.  Past Medical History  Diagnosis Date  . Nonischemic cardiomyopathy     LVEF 5-10%, likely viral (no CAD by cath 01/30/11)  . Essential hypertension, benign   . NSVT (nonsustained ventricular tachycardia)   . Obesity   . Hydronephrosis with renal and ureteral calculus obstruction 09/05/2013  . Chronic systolic heart failure   . History of medication noncompliance     Current Outpatient Prescriptions  Medication Sig Dispense Refill  . acetaminophen (TYLENOL) 500 MG tablet Take 1,000 mg by mouth every 6 (six) hours as needed for moderate pain.    Marland Kitchen amiodarone (PACERONE) 200 MG tablet Take 1 tablet (200 mg total) by mouth daily. 34 tablet 3  . apixaban (ELIQUIS) 5 MG TABS tablet Take 1 tablet (5 mg total) by mouth 2 (two) times daily. 60 tablet 3  .  carvedilol (COREG) 6.25 MG tablet Take 1.5 tablets (9.375 mg total) by mouth 2 (two) times daily. 60 tablet 3  . Coenzyme Q10 (CO Q 10) 100 MG CAPS Take 100 mg by mouth daily.     . digoxin (LANOXIN) 0.125 MG tablet Take 1 tablet (0.125 mg total) by mouth daily. 30 tablet 3  . furosemide (LASIX) 40 MG tablet Take 1 tablet (40 mg total) by mouth 2 (two) times a week. Every Mon and Fri 90 tablet 3  . hydrALAZINE (APRESOLINE) 100 MG tablet Take 1 tablet (100 mg total) by mouth 3 (three) times daily. 90 tablet 3  . isosorbide mononitrate (IMDUR) 30 MG 24 hr tablet TAKE 3 TABLETS (90 MG) BY MOUTH ONCE DAILY  102 tablet 3  . Magnesium Oxide 400 (240 MG) MG TABS Take 1 tablet by mouth daily. 90 tablet 3  . potassium chloride SA (K-DUR,KLOR-CON) 20 MEQ tablet Take 2 tablets (40 mEq total) by mouth daily. 180 tablet 3  . spironolactone (ALDACTONE) 25 MG tablet Take 1 tablet (25 mg total) by mouth daily. 90 tablet 3   No current facility-administered medications for this encounter.     PHYSICAL EXAM: BP 148/80 mmHg  Pulse 83  Wt 172 lb (78.019 kg)  SpO2 97%  LMP 10/28/1999 General:  NAD   HEENT: normal Neck: supple. JVP 5-6. Carotids 2+ bilaterally; no bruits. No lymphadenopathy or thryomegaly appreciated. Cor: PMI normal. Regular rate & rhythm. No rubs, or murmurs. No S3/S4 Lungs: clear Abdomen: soft, nontender, nondistended. No hepatosplenomegaly. No bruits or masses. Good bowel sounds. Extremities: no cyanosis, clubbing, rash. Lower extremities cool. No edema.  Neuro: alert & orientedx3, cranial nerves grossly intact. Moves all 4 extremities w/o difficulty. Affect pleasant.  ASSESSMENT & PLAN: 1. Chronic Systolic Heart Failure: Has St Jude ICD 2012,  Nonischemic cardiomyopathy thought to potentially be due to viral myocarditis noted initially in 2011. Cath 2012 norm coronaries.  Echo (7/15) with EF 5-10% and RV mod-severely dilated with moderately decreased systolic function. Repeat echo 10/15 EF up to 20-25%. She was on milrinone briefly, now off.  Now NYHA I-II. Euvolemic on exam today.  - Continue Lasix 40 mg twice weekly. Continue spironolactone 25 mg daily   - No ACEI/ARB due to recurrent episodes of angioedema.  - Increase Coreg to 12.5 mg bid.  - Continue hydralazine 100 mg tid and Imdur 90 mg daily.   - Continue digoxin 0.125 mg daily, will check digoxin level.  - CPX test to be scheduled.  - Reinforced daily weights, low salt diet and limiting fluid intake to < 2 liters per day.  - Check BMET today  Completed Pre LVAD tests Blood Type- B+  Lower Extremity Arterial Dopplers:  Normal arterial flow Carotid Dopplers:  1-39 percent stenosis involving the right internal carotid artery and the left internal carotid artery. Had colonoscopy in 2013. CT on chest-06/06/2014 - ? R base opacities? Infections.    PFTs: normal  2. Atrial fibrillation: Paroxysmal, NSR today. No bleeding problems on eliquis 5 mg twice a day. Continue amiodarone.  Check LFTs and TSH today.  Will need regular eye exams on amiodarone.  3. Angioedema: Had in past on ACEI, had another episode after ACEI stopped.  She is no longer on ACEI or ARB. ?Hereditary or acquired C1 inhibitor deficiency. No further problems with this recently.   4. CKD: Creatinine stable recently, BMET today.   5. Acute sinusitis: Failing symptomatic treatment, still with yellow nasal drainage and sinus area pain.  Will treat with 2 weeks of Augmentin.   Loralie Champagne  12/07/2014 9:26 AM

## 2014-12-07 NOTE — Patient Instructions (Signed)
INCREASE Carvedilol (Coreg) to 12.5mg  twice daily.  Take Augmentin (Antibiotic) 1 tablet twice daily with food for two weeks until bottle empty.  Follow up 3 months.  Do the following things EVERYDAY: 1) Weigh yourself in the morning before breakfast. Write it down and keep it in a log. 2) Take your medicines as prescribed 3) Eat low salt foods-Limit salt (sodium) to 2000 mg per day.  4) Stay as active as you can everyday 5) Limit all fluids for the day to less than 2 liters

## 2014-12-08 NOTE — Addendum Note (Signed)
Encounter addended by: Asencion Gowda, CCT on: 12/08/2014 12:07 PM<BR>     Documentation filed: Charges VN

## 2014-12-13 MED ORDER — DOXYCYCLINE HYCLATE 50 MG PO CAPS: 100.0000 mg | ORAL_CAPSULE | Freq: Two times a day (BID) | ORAL | Status: DC

## 2014-12-13 NOTE — Telephone Encounter (Signed)
Pt aware.

## 2014-12-13 NOTE — Telephone Encounter (Signed)
Will forward to provider  

## 2014-12-13 NOTE — Telephone Encounter (Signed)
Doxycycline 100 mg bid.

## 2014-12-22 ENCOUNTER — Telehealth (HOSPITAL_COMMUNITY): Payer: Self-pay | Admitting: Vascular Surgery

## 2014-12-22 MED ORDER — ISOSORBIDE MONONITRATE ER 30 MG PO TB24: ORAL_TABLET | ORAL | Status: DC

## 2014-12-22 NOTE — Telephone Encounter (Signed)
Pt called for a refill of Isosorbide to the [facility] outpatient pharmacy they told her she needed a auth it that takes 24 hrs.. Pt states she needs medication today.. Please advise

## 2014-12-22 NOTE — Telephone Encounter (Signed)
Pt aware, rx sent in 

## 2015-01-05 IMAGING — CR DG CHEST 2V
2 series · 2 of 2 positions shown · non-contrast
Comparison: 03/08/2011

CLINICAL DATA: Shortness of breath

CHEST - 2 VIEW

[view not recorded (1 of 2)]
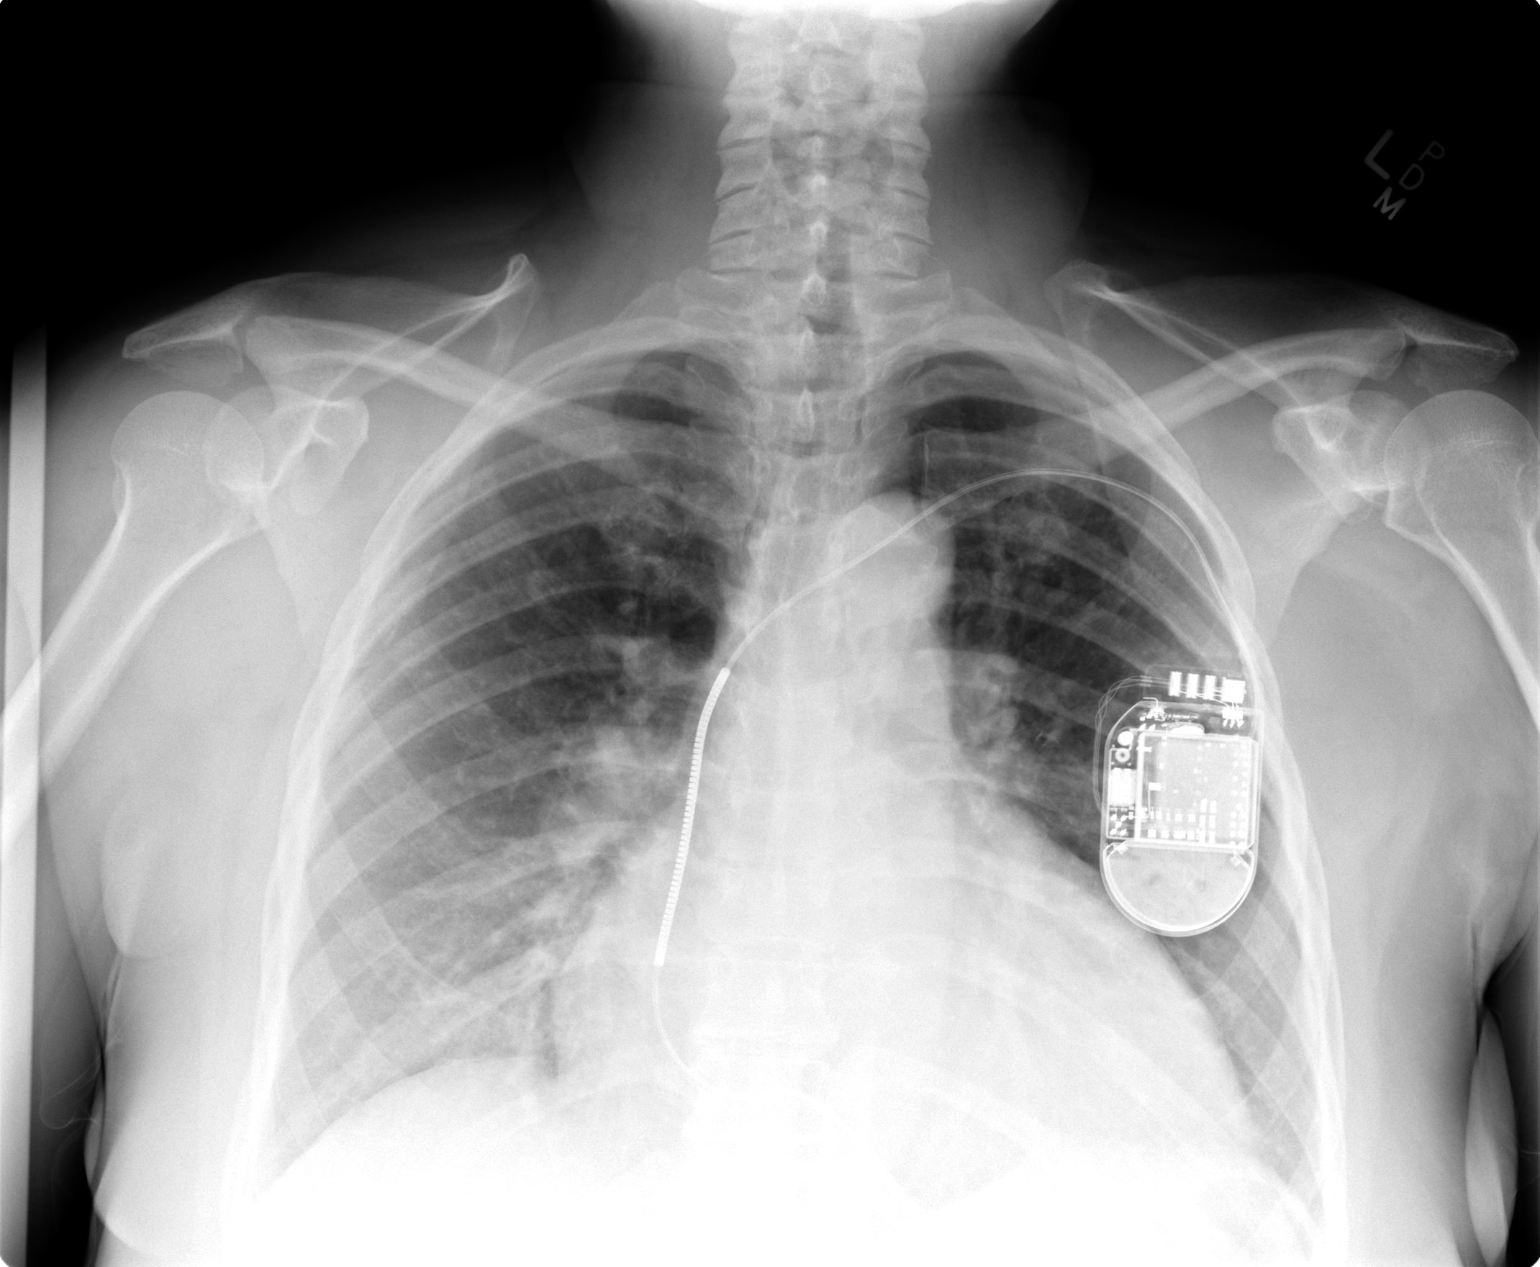

[view not recorded (2 of 2)]
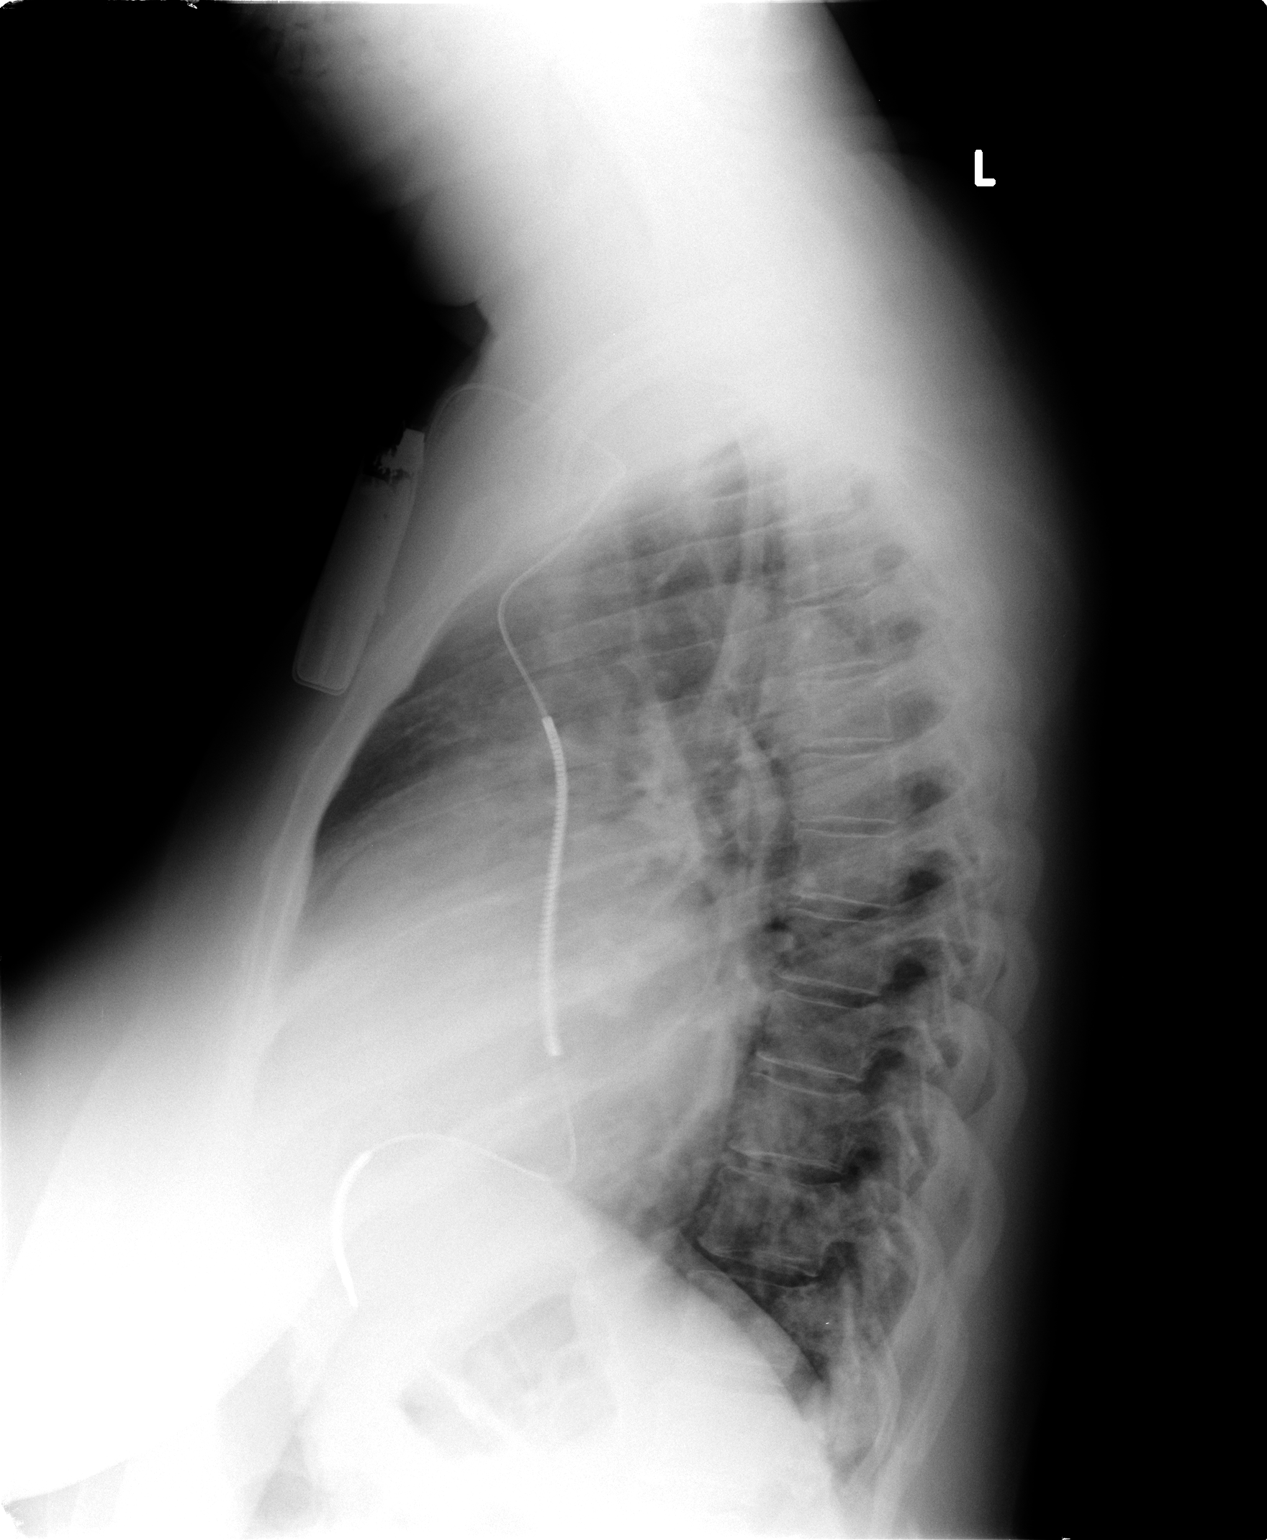

[2 of 2 positions shown; findings below may reference images not displayed]

FINDINGS: A defibrillator is again seen.  The lungs are clear
bilaterally.  Cardiac shadow remains mildly enlarged.  No acute
bony abnormality is seen.
IMPRESSION: No acute abnormality noted.

## 2015-01-17 ENCOUNTER — Other Ambulatory Visit (HOSPITAL_COMMUNITY): Payer: Self-pay | Admitting: Internal Medicine

## 2015-01-17 DIAGNOSIS — R2232 Localized swelling, mass and lump, left upper limb: Secondary | ICD-10-CM

## 2015-01-17 DIAGNOSIS — R222 Localized swelling, mass and lump, trunk: Secondary | ICD-10-CM

## 2015-01-19 ENCOUNTER — Other Ambulatory Visit (HOSPITAL_COMMUNITY): Payer: Self-pay

## 2015-01-19 ENCOUNTER — Ambulatory Visit (HOSPITAL_COMMUNITY): Admission: RE | Admit: 2015-01-19 | Payer: MEDICAID | Source: Ambulatory Visit

## 2015-01-23 ENCOUNTER — Ambulatory Visit (HOSPITAL_COMMUNITY)
Admission: RE | Admit: 2015-01-23 | Discharge: 2015-01-23 | Disposition: A | Discharge status: Home / Self Care | Payer: Medicaid Other | Source: Ambulatory Visit | Attending: Internal Medicine | Admitting: Internal Medicine

## 2015-01-23 ENCOUNTER — Other Ambulatory Visit (HOSPITAL_COMMUNITY): Payer: Self-pay | Admitting: Internal Medicine

## 2015-01-23 DIAGNOSIS — R2232 Localized swelling, mass and lump, left upper limb: Secondary | ICD-10-CM

## 2015-01-23 DIAGNOSIS — R222 Localized swelling, mass and lump, trunk: Secondary | ICD-10-CM

## 2015-02-26 ENCOUNTER — Other Ambulatory Visit (HOSPITAL_COMMUNITY): Payer: Self-pay | Admitting: Cardiology

## 2015-03-19 ENCOUNTER — Other Ambulatory Visit (HOSPITAL_COMMUNITY): Payer: Self-pay | Admitting: Adult Health

## 2015-04-02 ENCOUNTER — Other Ambulatory Visit (HOSPITAL_COMMUNITY): Payer: Self-pay | Admitting: Internal Medicine

## 2015-04-26 ENCOUNTER — Other Ambulatory Visit (HOSPITAL_COMMUNITY): Payer: Self-pay | Admitting: Cardiology

## 2015-07-20 ENCOUNTER — Telehealth (HOSPITAL_COMMUNITY): Payer: Self-pay | Admitting: Pharmacist

## 2015-07-20 NOTE — Telephone Encounter (Signed)
Eliquis patient assistance was approved through [place] with eligibility from 07/19/2015 through 07/18/2016. Left VM with patient about status. [facility] will reach out to patient to schedule shipments to her home.   Ruta Hinds. Velva Harman, PharmD, BCPS, CPP Clinical Pharmacist Pager: [phone] Phone: [phone] 07/20/2015 11:41 AM

## 2015-07-30 ENCOUNTER — Other Ambulatory Visit (HOSPITAL_COMMUNITY): Payer: Self-pay | Admitting: Adult Health

## 2015-08-02 ENCOUNTER — Other Ambulatory Visit (HOSPITAL_COMMUNITY): Payer: Self-pay | Admitting: Internal Medicine

## 2015-08-13 ENCOUNTER — Other Ambulatory Visit (HOSPITAL_COMMUNITY): Payer: Self-pay | Admitting: Internal Medicine

## 2015-08-24 ENCOUNTER — Other Ambulatory Visit (HOSPITAL_COMMUNITY): Payer: Self-pay | Admitting: Internal Medicine

## 2015-08-28 ENCOUNTER — Other Ambulatory Visit (HOSPITAL_COMMUNITY): Payer: Self-pay | Admitting: Internal Medicine

## 2015-10-01 ENCOUNTER — Other Ambulatory Visit (HOSPITAL_COMMUNITY): Payer: Self-pay | Admitting: Internal Medicine

## 2015-10-02 ENCOUNTER — Encounter: Payer: Self-pay | Admitting: Licensed Clinical Social Worker

## 2015-10-02 ENCOUNTER — Encounter (HOSPITAL_COMMUNITY): Payer: Self-pay

## 2015-10-02 ENCOUNTER — Ambulatory Visit (HOSPITAL_COMMUNITY)
Admission: RE | Admit: 2015-10-02 | Discharge: 2015-10-02 | Disposition: A | Discharge status: Home / Self Care | Payer: Self-pay | Source: Ambulatory Visit | Attending: Cardiology | Admitting: Cardiology

## 2015-10-02 VITALS — BP 120/72 | HR 64 | Wt 183.8 lb

## 2015-10-02 DIAGNOSIS — I4892 Unspecified atrial flutter: Secondary | ICD-10-CM | POA: Insufficient documentation

## 2015-10-02 DIAGNOSIS — Z950 Presence of cardiac pacemaker: Secondary | ICD-10-CM | POA: Insufficient documentation

## 2015-10-02 DIAGNOSIS — Z79899 Other long term (current) drug therapy: Secondary | ICD-10-CM | POA: Insufficient documentation

## 2015-10-02 DIAGNOSIS — E669 Obesity, unspecified: Secondary | ICD-10-CM | POA: Insufficient documentation

## 2015-10-02 DIAGNOSIS — Z7901 Long term (current) use of anticoagulants: Secondary | ICD-10-CM | POA: Insufficient documentation

## 2015-10-02 DIAGNOSIS — I5022 Chronic systolic (congestive) heart failure: Secondary | ICD-10-CM

## 2015-10-02 DIAGNOSIS — I129 Hypertensive chronic kidney disease with stage 1 through stage 4 chronic kidney disease, or unspecified chronic kidney disease: Secondary | ICD-10-CM | POA: Insufficient documentation

## 2015-10-02 DIAGNOSIS — I480 Paroxysmal atrial fibrillation: Secondary | ICD-10-CM | POA: Insufficient documentation

## 2015-10-02 DIAGNOSIS — Z95810 Presence of automatic (implantable) cardiac defibrillator: Secondary | ICD-10-CM | POA: Insufficient documentation

## 2015-10-02 DIAGNOSIS — I429 Cardiomyopathy, unspecified: Secondary | ICD-10-CM | POA: Insufficient documentation

## 2015-10-02 DIAGNOSIS — I5023 Acute on chronic systolic (congestive) heart failure: Secondary | ICD-10-CM | POA: Insufficient documentation

## 2015-10-02 LAB — COMPREHENSIVE METABOLIC PANEL
ALBUMIN: 3.6 g/dL (ref 3.5–5.0)
ALT: 14 U/L (ref 14–54)
ANION GAP: 10 (ref 5–15)
AST: 15 U/L (ref 15–41)
Alkaline Phosphatase: 38 U/L (ref 38–126)
BUN: 27 mg/dL — AB (ref 6–20)
CALCIUM: 9.8 mg/dL (ref 8.9–10.3)
CO2: 23 mmol/L (ref 22–32)
Chloride: 104 mmol/L (ref 101–111)
Creatinine, Ser: 1.47 mg/dL — ABNORMAL HIGH (ref 0.44–1.00)
GFR calc Af Amer: 46 mL/min — ABNORMAL LOW (ref >60)
GFR calc non Af Amer: 39 mL/min — ABNORMAL LOW (ref >60)
Glucose, Bld: 108 mg/dL — ABNORMAL HIGH (ref 65–99)
Potassium: 4.8 mmol/L (ref 3.5–5.1)
Sodium: 137 mmol/L (ref 135–145)
TOTAL PROTEIN: 7.9 g/dL (ref 6.5–8.1)
Total Bilirubin: 0.6 mg/dL (ref 0.3–1.2)

## 2015-10-02 LAB — CBC
HEMATOCRIT: 35 % — AB (ref 36.0–46.0)
Hemoglobin: 11.6 g/dL — ABNORMAL LOW (ref 12.0–15.0)
MCH: 30.6 pg (ref 26.0–34.0)
MCHC: 33.1 g/dL (ref 30.0–36.0)
MCV: 92.3 fL (ref 78.0–100.0)
Platelets: 184 10*3/uL (ref 150–400)
RBC: 3.79 MIL/uL — AB (ref 3.87–5.11)
RDW: 15.2 % (ref 11.5–15.5)
WBC: 8.1 10*3/uL (ref 4.0–10.5)

## 2015-10-02 LAB — TSH: TSH: 1.695 u[IU]/mL (ref 0.350–4.500)

## 2015-10-02 LAB — DIGOXIN LEVEL: Digoxin Level: 0.6 ng/mL — ABNORMAL LOW (ref 0.8–2.0)

## 2015-10-02 MED ORDER — CARVEDILOL 12.5 MG PO TABS: 18.7500 mg | ORAL_TABLET | Freq: Two times a day (BID) | ORAL | Status: DC

## 2015-10-02 NOTE — Progress Notes (Signed)
CSW referred to assist with medicaid explanation. Patient reports she was previously on medicaid although it was terminated after 6 months. CSW explained that patient was most likely on deductible medicaid which requires patient meets a deductible before becoming eligible again. Patient reports she recently went to [facility] and appears she does not meet criteria at this time. CSW discussed options for insurance and encouraged her to apply at healthcare.gov for insurance until she becomes eligible for Medicare. Patient thinks she will have Medicare in May, 2017. CSW available as needed. Raquel Sarna, [place]

## 2015-10-02 NOTE — Progress Notes (Signed)
Patient ID: Anita Warren, female   DOB: Apr 21, 1961, 54 y.o.   MRN: [id number] Primary Physician: Glo Herring., MD  Primary Cardiologist: Carlyle Dolly MD [facility]) EP: Dr Rayann Heman   HPI: [name] is a 54 y.o.female with history of viral, non-ischemic cardiomyopathy, severe systolic dysfunction with EF of 10% s/p ICD pacemaker in the setting of VT 02/2011 ([employer] Jude);  Most recent cath 2012 with no CAD.   Admitted 7/15 with acute on chronic systolic CHF she was not taking medications for 4 months because she lost her insurance. She was diuresed with IV lasix and transitioned to lasix 40 mg daily. She was also discharged on 12.5 mg carvedilol twice a day, hydralazine 25 mg tid, and 30 mg Imdur daily. Discharge weight was 176 pounds. She was not on an ACEI due to angioedema.  Admitted from HF clinic 06/02/14 with low output heart failure. Swan placed and showed cardiogenic shock. Ultimately discharged on  Milrinone 0.25 mcg/kg/min. Hospital stay was complicated by atrial flutter. Loaded on amiodarone and started on eliquis 5 mg twice a day. Met with Dr Prescott Gum and VAD coordinator. She was titrated off the milrinone.   Recently, she has been stable.  No dyspnea unless she walks fast.  She can climb stairs with no problems.  No orthopnea/PND.  No lightheadedness or tachypalpitations.  Walking for exercise.  Taking all meds.   She is no longer on ACEI or ARB because of recurrent angioedema.    05/23/14 ECHO EF 5-10% RV mod to severely dilated.  08/09/14 ECHO EF 20-25% RV read as normal  ECG: NSR with LVH, diffuse T wave flattening  Labs:  HIV negative 2015  9/15: K 3.6 => 4.6, creatinine 0.99 => 1.5 => 1.2, Mg 1.9, HCT 45.2, LFTs normal, TSH normal 9/15: PFTs normal 07/31/14: Co-ox off milrinone 66%  K 4.5 Cr 1.36 08/23/14: k 4.4, cr 1.51 digoxin 0.7 09/20/14: K 4.9, creatinine 1.64  12/15: K 4.4, creatinine 1.36 2/16: K 4.2, creatinine 1.67, HCT 33.9  2013 Colonoscopy Blood Type:   B+  Lower Extremity Doppler - Normal arterial flow Carotid Doppler-  1-39% stenosis involving the right internal carotid artery and the left internal carotid Artery.  SH: Disabled  Lives with husband and son. Has 3 grown children. Does not smoke or drink alcohol.. 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  Past Medical History  Diagnosis Date  . Nonischemic cardiomyopathy (HCC)     LVEF 5-10%, likely viral (no CAD by cath 01/30/11)  . Essential hypertension, benign   . NSVT (nonsustained ventricular tachycardia) ([place])   . Obesity   . Hydronephrosis with renal and ureteral calculus obstruction 09/05/2013  . Chronic systolic heart failure ([place])   . History of medication noncompliance     Current Outpatient Prescriptions  Medication Sig Dispense Refill  . acetaminophen (TYLENOL) 500 MG tablet Take 1,000 mg by mouth every 6 (six) hours as needed for moderate pain.    Marland Kitchen amiodarone (PACERONE) 200 MG tablet TAKE 1 TABLET BY MOUTH ONCE DAILY 34 tablet 3  . apixaban (ELIQUIS) 5 MG TABS tablet Take 1 tablet (5 mg total) by mouth 2 (two) times daily. 60 tablet 3  . carvedilol (COREG) 12.5 MG tablet Take 1.5 tablets (18.75 mg total) by mouth 2 (two) times daily  with a meal. 90 tablet 5  . Coenzyme Q10 (CO Q 10) 100 MG CAPS Take 100 mg by mouth daily.     . digoxin (LANOXIN) 0.125 MG tablet TAKE 1 TABLET BY MOUTH DAILY. 30 tablet 5  . furosemide (LASIX) 40 MG tablet Take 1 tablet (40 mg total) by mouth 2 (two) times a week. Every Mon and Fri 90 tablet 3  . furosemide (LASIX) 40 MG tablet Take 48m twice a week mon & fri 15 tablet 3  . hydrALAZINE (APRESOLINE) 100 MG tablet TAKE 1 TABLET BY MOUTH 3 TIMES DAILY. 90 tablet PRN  . isosorbide mononitrate (IMDUR) 30 MG 24 hr tablet TAKE 3 TABLETS BY MOUTH ONCE DAILY 90 tablet PRN  . Magnesium Oxide 400 (240 MG) MG TABS Take 1  tablet by mouth daily. 90 tablet 3  . potassium chloride SA (K-DUR,KLOR-CON) 20 MEQ tablet TAKE 2 TABLETS BY MOUTH DAILY. 180 tablet 0  . spironolactone (ALDACTONE) 25 MG tablet TAKE 1 TABLET BY MOUTH DAILY. 90 tablet 0   No current facility-administered medications for this encounter.     PHYSICAL EXAM: BP 120/72 mmHg  Pulse 64  Wt 183 lb 12 oz (83.348 kg)  SpO2 99%  LMP 10/28/1999 General:  NAD   HEENT: normal Neck: supple. JVP 5-6. Carotids 2+ bilaterally; no bruits. No lymphadenopathy or thryomegaly appreciated. Cor: PMI normal. Regular rate & rhythm. No rubs, or murmurs. No S3/S4 Lungs: clear Abdomen: soft, nontender, nondistended. No hepatosplenomegaly. No bruits or masses. Good bowel sounds. Extremities: no cyanosis, clubbing, rash. Lower extremities cool. No edema.  Neuro: alert & orientedx3, cranial nerves grossly intact. Moves all 4 extremities w/o difficulty. Affect pleasant.  ASSESSMENT & PLAN: 1. Chronic Systolic Heart Failure: Has St Jude ICD 2012,  Nonischemic cardiomyopathy thought to potentially be due to viral myocarditis noted initially in 2011. Cath 2012 normal coronaries.  Echo (7/15) with EF 5-10% and RV mod-severely dilated with moderately decreased systolic function. Repeat echo 10/15 EF up to 20-25%. She was on milrinone briefly, now off.  Now NYHA I-II. Euvolemic on exam today.  Weight is up on our scale but think this is more due to diet and heavy winter clothing (versus fluid overload).  - Continue Lasix 40 mg twice weekly. Continue spironolactone 25 mg daily.  BMET today.  - No ACEI/ARB due to recurrent episodes of angioedema.  - Increase Coreg to 18.75 mg bid.  - Continue hydralazine 100 mg tid and Imdur 90 mg daily.   - Continue digoxin 0.125 mg daily, will check digoxin level.  - CPX test and repeat echo to be scheduled when she has her insurance situation straightened out.  She talked with our social worker today. 2. Atrial fibrillation: Paroxysmal, NSR  today.  - Continue Eliquis 5 mg bid. CBC today.  - Continue amiodarone.  Check LFTs and TSH today.  Will need regular eye exams on amiodarone. 3. Angioedema: Had in past on ACEI, had another episode after ACEI stopped.  She is no longer on ACEI or ARB. ?Hereditary or acquired C1 inhibitor deficiency. No further problems with this recently.  4. CKD: Creatinine stable recently, BMET today.   Followup in 3 months.   DLoralie Champagne 10/02/2015

## 2015-10-02 NOTE — Patient Instructions (Signed)
Increase Carvedilol to 18.75 mg (1 and 1/2 tabs) Twice daily   Labs today  We will contact you in 3 months to schedule your next appointment.  When you get insurance call us to schedule an echocardiogram and CPX test

## 2015-10-15 ENCOUNTER — Other Ambulatory Visit (HOSPITAL_COMMUNITY): Payer: Self-pay | Admitting: Pharmacist

## 2015-10-15 MED ORDER — APIXABAN 5 MG PO TABS: 5.0000 mg | ORAL_TABLET | Freq: Two times a day (BID) | ORAL | Status: DC

## 2015-11-12 ENCOUNTER — Other Ambulatory Visit (HOSPITAL_COMMUNITY): Payer: Self-pay | Admitting: Internal Medicine

## 2015-11-24 IMAGING — CR DG CHEST 2V
2 series · 2 of 2 positions shown · non-contrast
Comparison: 10/28/2013

CLINICAL DATA: Cough and shortness of breath for 4 weeks. History
of cardiomyopathy.

EXAM:
CHEST  2 VIEW

[view not recorded (1 of 2)]
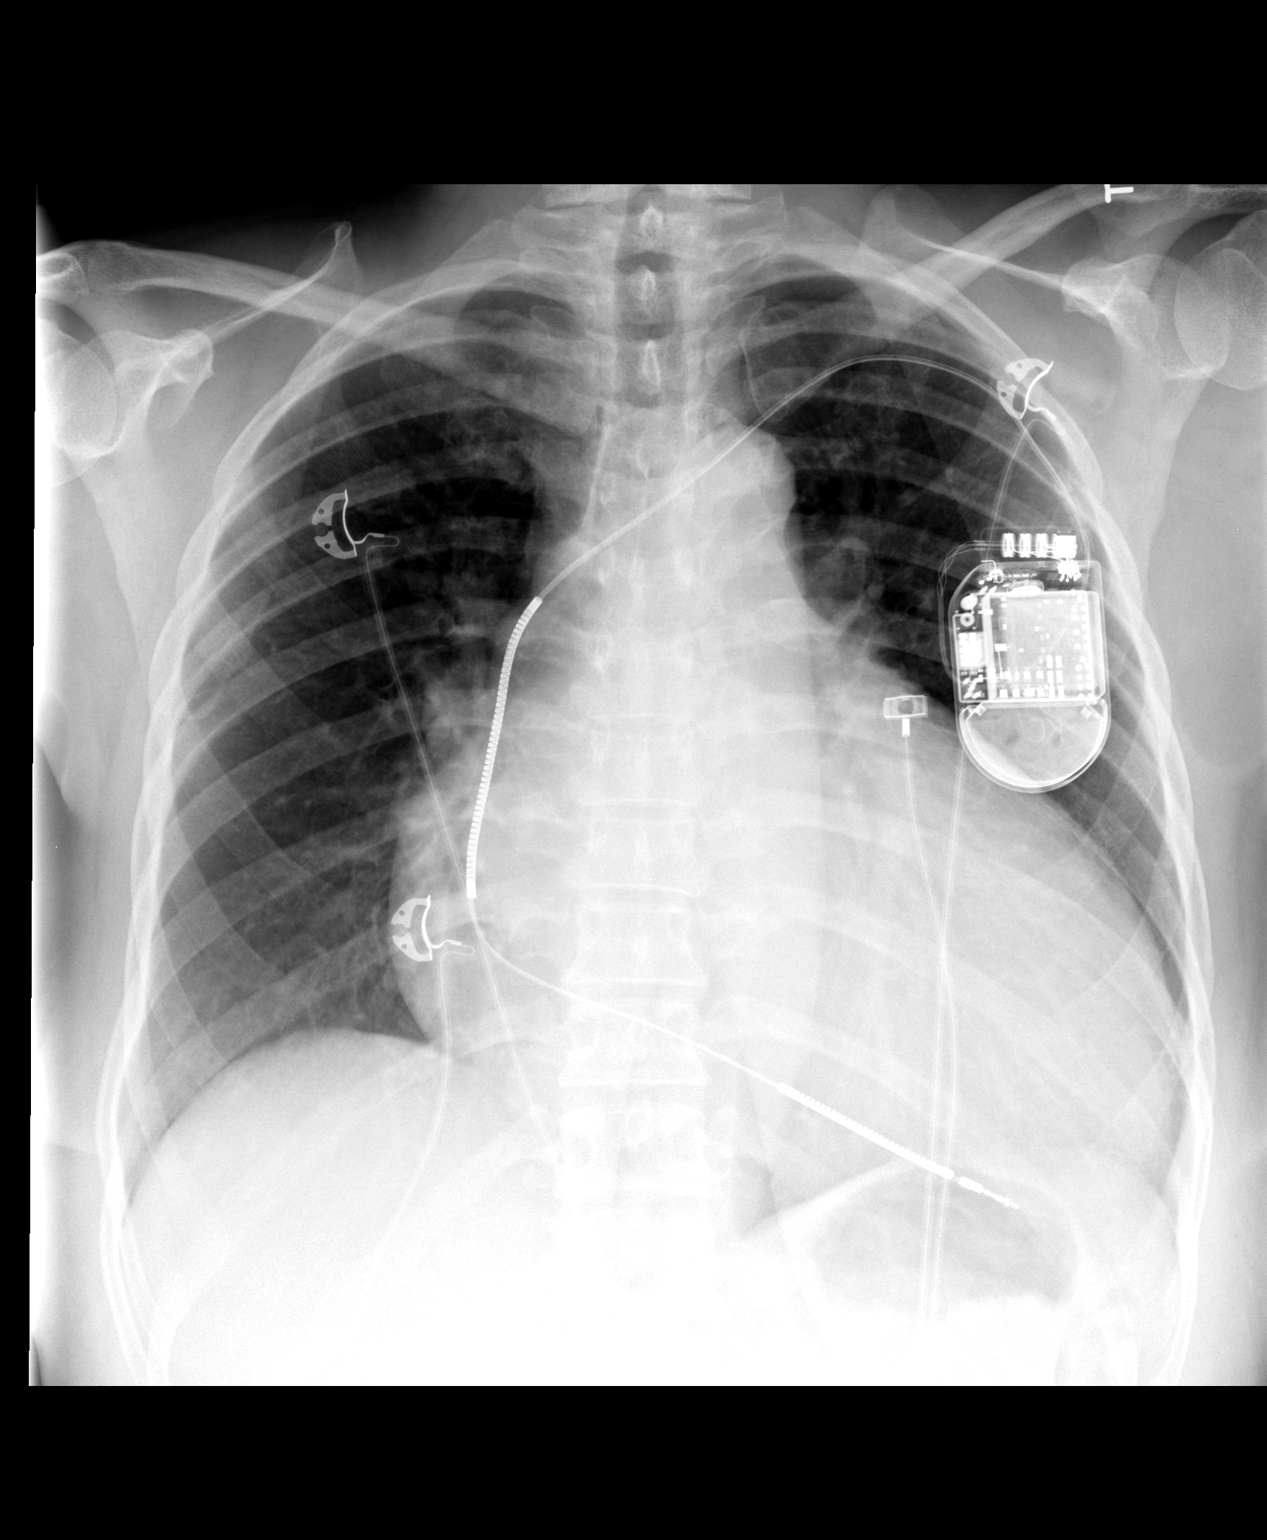

[view not recorded (2 of 2)]
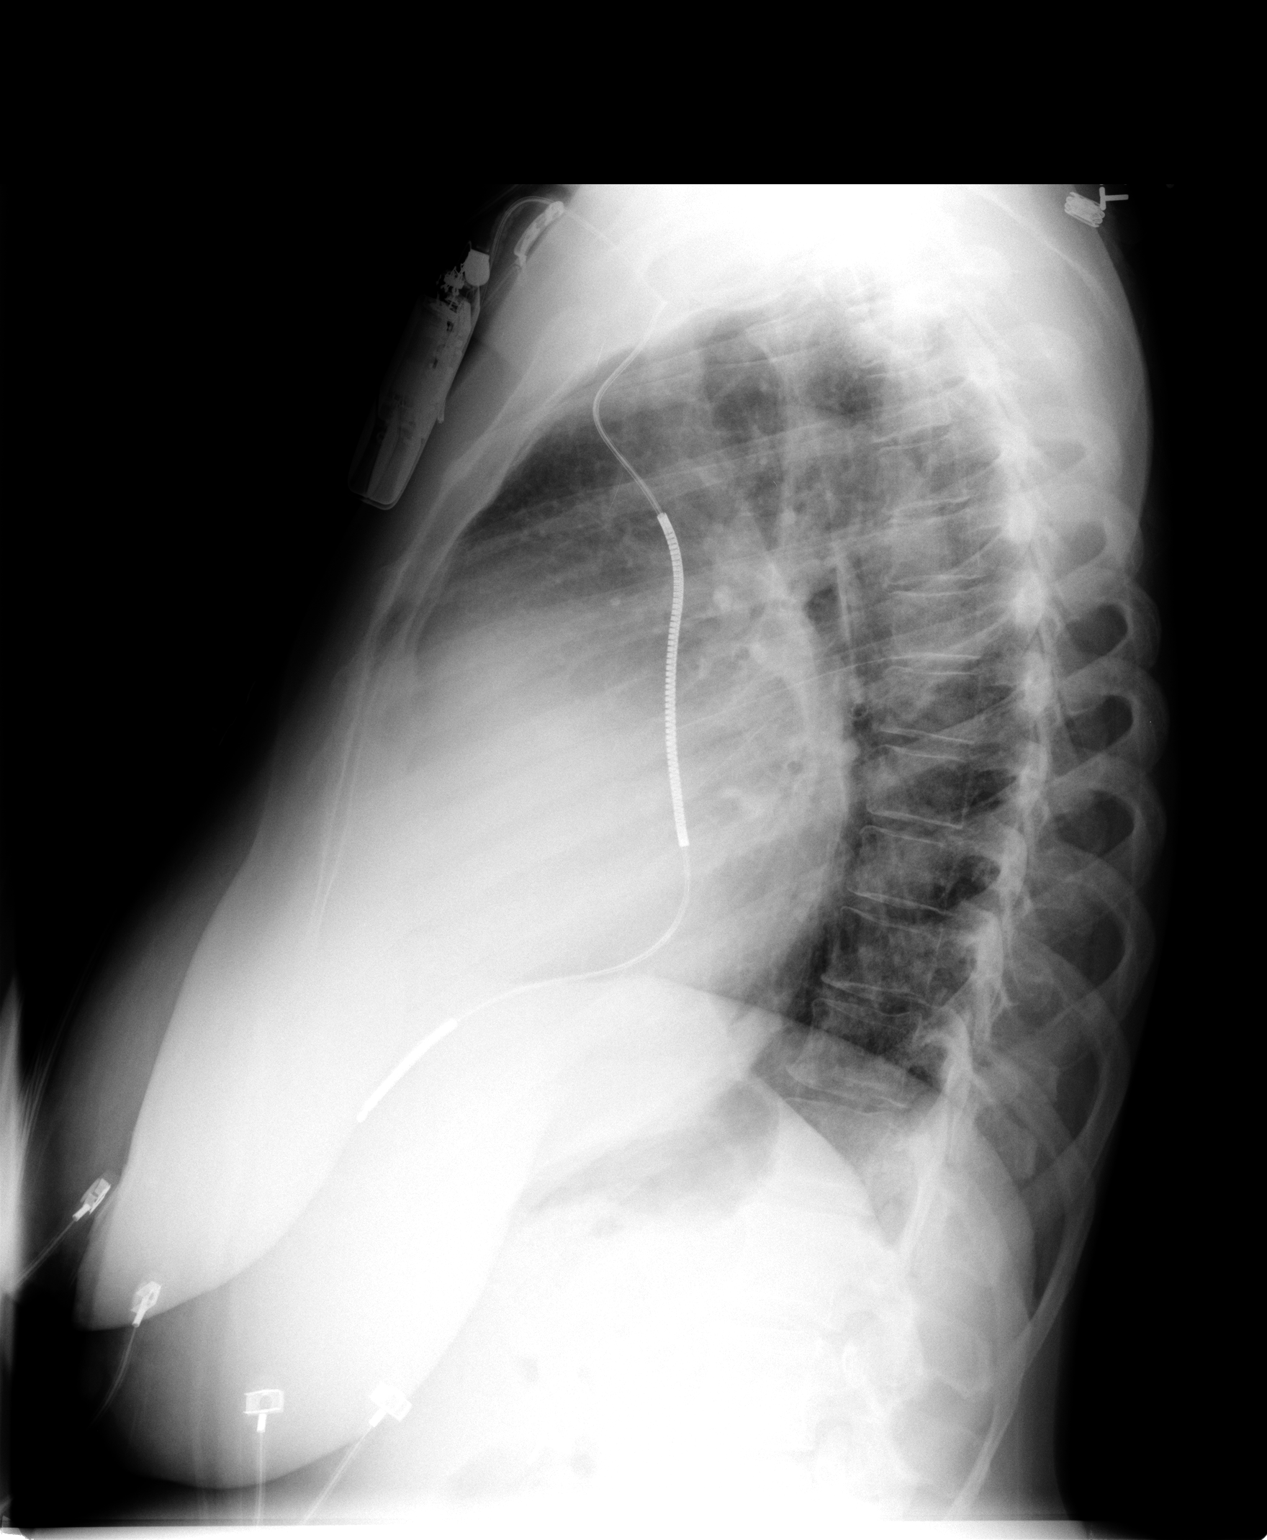

[2 of 2 positions shown; findings below may reference images not displayed]

FINDINGS: Cardiac pacemaker. Diffuse cardiac enlargement without vascular
congestion, similar to prior study. No focal airspace disease or
consolidation in the lungs. No blunting of costophrenic angles. No
pneumothorax.
IMPRESSION: Cardiac enlargement.  No evidence of active pulmonary disease.

## 2015-12-19 ENCOUNTER — Other Ambulatory Visit (HOSPITAL_COMMUNITY): Payer: Self-pay | Admitting: Adult Health

## 2016-01-11 ENCOUNTER — Other Ambulatory Visit (HOSPITAL_COMMUNITY): Payer: Self-pay | Admitting: Internal Medicine

## 2016-02-25 ENCOUNTER — Other Ambulatory Visit (HOSPITAL_COMMUNITY): Payer: Self-pay | Admitting: Internal Medicine

## 2016-03-05 ENCOUNTER — Emergency Department (HOSPITAL_COMMUNITY)
Admission: EM | Admit: 2016-03-05 | Discharge: 2016-03-05 | Disposition: A | Discharge status: Home / Self Care | Payer: Medicare Other | Attending: Emergency Medicine | Admitting: Emergency Medicine

## 2016-03-05 ENCOUNTER — Emergency Department (HOSPITAL_COMMUNITY): Payer: Medicare Other

## 2016-03-05 ENCOUNTER — Encounter (HOSPITAL_COMMUNITY): Payer: Self-pay | Admitting: Emergency Medicine

## 2016-03-05 DIAGNOSIS — M25561 Pain in right knee: Secondary | ICD-10-CM | POA: Diagnosis not present

## 2016-03-05 DIAGNOSIS — Y999 Unspecified external cause status: Secondary | ICD-10-CM | POA: Diagnosis not present

## 2016-03-05 DIAGNOSIS — I10 Essential (primary) hypertension: Secondary | ICD-10-CM | POA: Insufficient documentation

## 2016-03-05 DIAGNOSIS — Y939 Activity, unspecified: Secondary | ICD-10-CM | POA: Insufficient documentation

## 2016-03-05 DIAGNOSIS — E6609 Other obesity due to excess calories: Secondary | ICD-10-CM | POA: Diagnosis not present

## 2016-03-05 DIAGNOSIS — I5022 Chronic systolic (congestive) heart failure: Secondary | ICD-10-CM | POA: Insufficient documentation

## 2016-03-05 DIAGNOSIS — Y929 Unspecified place or not applicable: Secondary | ICD-10-CM | POA: Insufficient documentation

## 2016-03-05 DIAGNOSIS — M7989 Other specified soft tissue disorders: Secondary | ICD-10-CM | POA: Diagnosis not present

## 2016-03-05 DIAGNOSIS — X501XXA Overexertion from prolonged static or awkward postures, initial encounter: Secondary | ICD-10-CM | POA: Diagnosis not present

## 2016-03-05 DIAGNOSIS — S8991XA Unspecified injury of right lower leg, initial encounter: Secondary | ICD-10-CM | POA: Insufficient documentation

## 2016-03-05 MED ORDER — HYDROCODONE-ACETAMINOPHEN 5-325 MG PO TABS: 2.0000 | ORAL_TABLET | ORAL | Status: DC | PRN

## 2016-03-05 NOTE — ED Notes (Signed)
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 Her right knee popped, she has been using heat and ice and OTC medication , pain is getting worse.

## 2016-03-05 NOTE — ED Provider Notes (Signed)
CSN: [id number]     Arrival date & time 03/05/16  1857 History  By signing my name below, I, Anita Warren, attest that this documentation has been prepared under the direction and in the presence of Anita Meadow, PA-C. Electronically Signed: Randa Warren, ED Scribe. 03/05/2016. 8:24 PM.     Chief Complaint  Patient presents with  . Knee Pain   The history is provided by the patient. No language interpreter was used.   HPI Comments: Anita Warren is a 55 y.o. female who presents to the Emergency Department complaining of right knee pain onset 1 week prior. Pt states that she was bending down and felt her knee pop. She presents with associated swelling. Pt states that the pain is worse with movement and bearing weight. Pt states she has been taking OTC medications and applying ice with no relief. Pt denies numbness, tingling or weakness.    Past Medical History  Diagnosis Date  . Nonischemic cardiomyopathy (HCC)     LVEF 5-10%, likely viral (no CAD by cath 01/30/11)  . Essential hypertension, benign   . NSVT (nonsustained ventricular tachycardia) ([place])   . Obesity   . Hydronephrosis with renal and ureteral calculus obstruction 09/05/2013  . Chronic systolic heart failure (Anita Warren)   . History of medication noncompliance    Past Surgical History  Procedure Laterality Date  . Tonsillectomy    . Breast lumpectomy  1989    L breast- benign  . Tubal ligation    . Cholecystectomy    . Cardiac defibrillator placement  5/12    SJM by JA  . Colonoscopy  07/02/2012    Procedure: COLONOSCOPY;  Surgeon: Anita Binder, MD;  Location: [facility] END[facility] SUITE;  Service: Endoscopy;  Laterality: N/A;  1:15/PATIENT HAS A DEFIBRILLATOR  . Cystoscopy w/ ureteral stent placement Right 09/06/2013    Procedure: CYSTOSCOPY WITH RIGHT RETROGRADE PYELOGRAM; RIGHT URETERAL STENT PLACEMENT;  Surgeon: Anita Nestle, MD;  Location: [facility] ORS;  Service: Urology;  Laterality: Right;  . Cesarean section      x  2   Family History  Problem Relation Age of Onset  . Hypertension    . Diabetes    . Colon cancer Neg Hx    Social History  Substance Use Topics  . Smoking status: Never Smoker   . Smokeless tobacco: Never Used  . Alcohol Use: No   OB History    No data available      Review of Systems  Musculoskeletal: Positive for arthralgias.  Neurological: Negative for weakness and numbness.  All other systems reviewed and are negative.    Allergies  Peanut-containing drug products; Ace inhibitors; Morphine; and Demerol  Home Medications   Prior to Admission medications   Medication Sig Start Date End Date Taking? Authorizing Provider  acetaminophen (TYLENOL) 500 MG tablet Take 1,000 mg by mouth every 6 (six) hours as needed for moderate pain.    Historical Provider, MD  amiodarone (PACERONE) 200 MG tablet TAKE 1 TABLET BY MOUTH ONCE DAILY 01/11/16   Jolaine Artist, MD  apixaban (ELIQUIS) 5 MG TABS tablet Take 1 tablet (5 mg total) by mouth 2 (two) times daily. 10/15/15   Jolaine Artist, MD  carvedilol (COREG) 12.5 MG tablet Take 1.5 tablets (18.75 mg total) by mouth 2 (two) times daily with a meal. 10/02/15   Larey Dresser, MD  Coenzyme Q10 (CO Q 10) 100 MG CAPS Take 100 mg by mouth daily.  Historical Provider, MD  DIGITEK 125 MCG tablet TAKE 1 TABLET BY MOUTH DAILY. 02/26/16   Jolaine Artist, MD  furosemide (LASIX) 40 MG tablet Take 1 tablet (40 mg total) by mouth 2 (two) times a week. Every Mon and Fri 09/26/14   Jolaine Artist, MD  furosemide (LASIX) 40 MG tablet Take 40mg  twice a week mon & fri 10/02/15   Jolaine Artist, MD  hydrALAZINE (APRESOLINE) 100 MG tablet TAKE 1 TABLET BY MOUTH 3 TIMES DAILY. 12/19/15   Amy D Ninfa Meeker, NP  isosorbide mononitrate (IMDUR) 30 MG 24 hr tablet TAKE 3 TABLETS BY MOUTH ONCE DAILY 08/02/15   Jolaine Artist, MD  Magnesium Oxide 400 (240 MG) MG TABS Take 1 tablet by mouth daily. 08/10/14   Jolaine Artist, MD  potassium chloride  SA (K-DUR,KLOR-CON) 20 MEQ tablet TAKE 2 TABLETS BY MOUTH DAILY. 08/13/15   Jolaine Artist, MD  spironolactone (ALDACTONE) 25 MG tablet TAKE 1 TABLET BY MOUTH DAILY. 11/12/15   Shaune Pascal Bensimhon, MD   BP 150/71 mmHg  Pulse 64  Temp(Src) 98.9 F (37.2 C) (Oral)  Resp 20  Ht 5\' 4"  (1.626 m)  Wt 182 lb (82.555 kg)  BMI 31.22 kg/m2  SpO2 100%  LMP 10/28/1999    Physical Exam  Constitutional: She is oriented to person, place, and time. She appears well-developed and well-nourished. No distress.  HENT:  Head: Normocephalic and atraumatic.  Eyes: Conjunctivae and EOM are normal.  Neck: Neck supple. No tracheal deviation present.  Cardiovascular: Normal rate.   Pulmonary/Chest: Effort normal. No respiratory distress.  Musculoskeletal: Normal range of motion.  Slight left knee effusion, tender medial aspect left knee, Decreased ROM, no instability.   Neurological: She is alert and oriented to person, place, and time.  Skin: Skin is warm and dry.  Psychiatric: She has a normal mood and affect. Her behavior is normal.  Nursing note and vitals reviewed.   ED Course  Procedures (including critical care time) DIAGNOSTIC STUDIES: Oxygen Saturation is 100% on RA, normal by my interpretation.    COORDINATION OF CARE: 8:17 PM-Discussed treatment plan with pt at bedside and pt agreed to plan.      Labs Review Labs Reviewed - No data to display  Imaging Review Dg Knee Complete 4 Views Right  03/05/2016  CLINICAL DATA:  Right knee injury approximately 10 days ago when bending over. Right knee pain and popping sensation. Initial encounter. EXAM: RIGHT KNEE - COMPLETE 4+ VIEW COMPARISON:  None. FINDINGS: There is no evidence of fracture, dislocation, or joint effusion. Mild medial compartment osteoarthritis noted. Enthesopathic change noted along the superior margin of patella. Infrapatellar soft tissue swelling noted. IMPRESSION: Infrapatellar soft tissue swelling. No evidence of fracture  or knee joint effusion. 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 Electronically Signed   By: Earle Gell M.D.   On: 03/05/2016 19:40   I have personally reviewed and evaluated these images as part of my medical decision-making.   EKG Interpretation None      MDM I suspect pt has meniscus tear.  Pt advised she needs to see Orthop[aedist for evaluation.   Final diagnoses:  Knee injury, right, initial encounter    Knee immbolizer Crutches An After Visit Summary was printed and given to the patient. Meds ordered this encounter  Medications  . HYDROcodone-acetaminophen (NORCO/VICODIN) 5-325 MG tablet    Sig: Take 2 tablets by mouth every 4 (four) hours as needed.    Dispense:  20  tablet    Refill:  0    Order Specific Question:  Supervising Provider    Answer:  Noemi Chapel [id number]      [place], PA-C 03/05/16 2323

## 2016-03-05 NOTE — Discharge Instructions (Signed)
Knee Effusion °Knee effusion means that you have excess fluid in your knee joint. This can cause pain and swelling in your knee. This may make your knee more difficult to bend and move. That is because there is increased pain and pressure in the joint. If there is fluid in your knee, it often means that something is wrong inside your knee, such as severe arthritis, abnormal inflammation, or an infection. Another common cause of knee effusion is an injury to the knee muscles, ligaments, or cartilage. °HOME CARE INSTRUCTIONS °· Use crutches as directed by your health care provider. °· Wear a knee brace as directed by your health care provider. °· Apply ice to the swollen area: °¨ Put ice in a plastic bag. °¨ Place a towel between your skin and the bag. °¨ Leave the ice on for 20 minutes, 2-3 times per day. °· Keep your knee raised (elevated) when you are sitting or lying down. °· Take medicines only as directed by your health care provider. °· Do any rehabilitation or strengthening exercises as directed by your health care provider. °· Rest your knee as directed by your health care provider. You may start doing your normal activities again when your health care provider approves.    °· Keep all follow-up visits as directed by your health care provider. This is important. °SEEK MEDICAL CARE IF: °· You have ongoing (persistent) pain in your knee. °SEEK IMMEDIATE MEDICAL CARE IF: °· You have increased swelling or redness of your knee. °· You have severe pain in your knee. °· You have a fever. °  °This information is not intended to replace advice given to you by your health care provider. Make sure you discuss any questions you have with your health care provider. °  °Document Released: 01/03/2004 Document Revised: 11/03/2014 Document Reviewed: 05/29/2014 °Elsevier Interactive Patient Education ©2016 [employer] ° °

## 2016-03-05 NOTE — ED Notes (Signed)
Pt verbalized understanding of no driving and to use caution within 4 hours of taking pain meds due to meds cause drowsiness 

## 2016-04-25 ENCOUNTER — Emergency Department (HOSPITAL_COMMUNITY)
Admission: EM | Admit: 2016-04-25 | Discharge: 2016-04-25 | Disposition: A | Discharge status: Home / Self Care | Payer: Medicare Other | Attending: Emergency Medicine | Admitting: Emergency Medicine

## 2016-04-25 ENCOUNTER — Encounter (HOSPITAL_COMMUNITY): Payer: Self-pay | Admitting: Emergency Medicine

## 2016-04-25 ENCOUNTER — Emergency Department (HOSPITAL_COMMUNITY): Payer: Medicare Other

## 2016-04-25 DIAGNOSIS — Z6828 Body mass index (BMI) 28.0-28.9, adult: Secondary | ICD-10-CM | POA: Diagnosis not present

## 2016-04-25 DIAGNOSIS — I110 Hypertensive heart disease with heart failure: Secondary | ICD-10-CM | POA: Insufficient documentation

## 2016-04-25 DIAGNOSIS — Z79899 Other long term (current) drug therapy: Secondary | ICD-10-CM | POA: Diagnosis not present

## 2016-04-25 DIAGNOSIS — N132 Hydronephrosis with renal and ureteral calculous obstruction: Secondary | ICD-10-CM | POA: Diagnosis not present

## 2016-04-25 DIAGNOSIS — I5022 Chronic systolic (congestive) heart failure: Secondary | ICD-10-CM | POA: Diagnosis not present

## 2016-04-25 DIAGNOSIS — N200 Calculus of kidney: Secondary | ICD-10-CM | POA: Insufficient documentation

## 2016-04-25 DIAGNOSIS — E669 Obesity, unspecified: Secondary | ICD-10-CM | POA: Insufficient documentation

## 2016-04-25 DIAGNOSIS — R109 Unspecified abdominal pain: Secondary | ICD-10-CM | POA: Diagnosis present

## 2016-04-25 LAB — CBC WITH DIFFERENTIAL/PLATELET
Basophils Absolute: 0 10*3/uL (ref 0.0–0.1)
Basophils Relative: 0 %
Eosinophils Absolute: 0.3 10*3/uL (ref 0.0–0.7)
Eosinophils Relative: 3 %
HEMATOCRIT: 30.5 % — AB (ref 36.0–46.0)
Hemoglobin: 9.9 g/dL — ABNORMAL LOW (ref 12.0–15.0)
LYMPHS PCT: 25 %
Lymphs Abs: 2.4 10*3/uL (ref 0.7–4.0)
MCH: 29.1 pg (ref 26.0–34.0)
MCHC: 32.5 g/dL (ref 30.0–36.0)
MCV: 89.7 fL (ref 78.0–100.0)
MONO ABS: 0.7 10*3/uL (ref 0.1–1.0)
MONOS PCT: 8 %
NEUTROS ABS: 6.4 10*3/uL (ref 1.7–7.7)
Neutrophils Relative %: 64 %
Platelets: 191 10*3/uL (ref 150–400)
RBC: 3.4 MIL/uL — ABNORMAL LOW (ref 3.87–5.11)
RDW: 15.5 % (ref 11.5–15.5)
WBC: 9.9 10*3/uL (ref 4.0–10.5)

## 2016-04-25 LAB — BASIC METABOLIC PANEL
ANION GAP: 7 (ref 5–15)
BUN: 19 mg/dL (ref 6–20)
CALCIUM: 8.4 mg/dL — AB (ref 8.9–10.3)
CHLORIDE: 106 mmol/L (ref 101–111)
CO2: 24 mmol/L (ref 22–32)
CREATININE: 1.4 mg/dL — AB (ref 0.44–1.00)
GFR calc Af Amer: 48 mL/min — ABNORMAL LOW (ref >60)
GFR calc non Af Amer: 42 mL/min — ABNORMAL LOW (ref >60)
GLUCOSE: 174 mg/dL — AB (ref 65–99)
Potassium: 3.5 mmol/L (ref 3.5–5.1)
Sodium: 137 mmol/L (ref 135–145)

## 2016-04-25 LAB — URINALYSIS, ROUTINE W REFLEX MICROSCOPIC
Bilirubin Urine: NEGATIVE
Glucose, UA: NEGATIVE mg/dL
Hgb urine dipstick: NEGATIVE
Ketones, ur: NEGATIVE mg/dL
Nitrite: NEGATIVE
PH: 6 (ref 5.0–8.0)
Protein, ur: NEGATIVE mg/dL
Specific Gravity, Urine: <1.005 — ABNORMAL LOW (ref 1.005–1.030)

## 2016-04-25 LAB — URINE MICROSCOPIC-ADD ON
Bacteria, UA: NONE SEEN
RBC / HPF: NONE SEEN RBC/hpf (ref 0–5)

## 2016-04-25 MED ORDER — KETOROLAC TROMETHAMINE 30 MG/ML IJ SOLN
30.0000 mg | Freq: Once | INTRAMUSCULAR | Status: AC
  Administered 2016-04-25: 30 mg via INTRAVENOUS
  Filled 2016-04-25: qty 1

## 2016-04-25 MED ORDER — METOCLOPRAMIDE HCL 5 MG/ML IJ SOLN
10.0000 mg | Freq: Once | INTRAMUSCULAR | Status: AC
  Administered 2016-04-25: 10 mg via INTRAVENOUS
  Filled 2016-04-25: qty 2

## 2016-04-25 MED ORDER — IBUPROFEN 800 MG PO TABS: 800.0000 mg | ORAL_TABLET | Freq: Three times a day (TID) | ORAL | Status: DC

## 2016-04-25 MED ORDER — HYDROMORPHONE HCL 1 MG/ML IJ SOLN
1.0000 mg | Freq: Once | INTRAMUSCULAR | Status: AC
  Administered 2016-04-25: 1 mg via INTRAVENOUS
  Filled 2016-04-25: qty 1

## 2016-04-25 MED ORDER — METOCLOPRAMIDE HCL 10 MG PO TABS: 10.0000 mg | ORAL_TABLET | Freq: Four times a day (QID) | ORAL | Status: DC | PRN

## 2016-04-25 MED ORDER — SODIUM CHLORIDE 0.9 % IV BOLUS (SEPSIS)
1000.0000 mL | Freq: Once | INTRAVENOUS | Status: AC
  Administered 2016-04-25: 1000 mL via INTRAVENOUS

## 2016-04-25 MED ORDER — ONDANSETRON HCL 4 MG/2ML IJ SOLN
4.0000 mg | Freq: Once | INTRAMUSCULAR | Status: AC
  Administered 2016-04-25: 4 mg via INTRAVENOUS
  Filled 2016-04-25: qty 2

## 2016-04-25 MED ORDER — TAMSULOSIN HCL 0.4 MG PO CAPS: 0.4000 mg | ORAL_CAPSULE | Freq: Every day | ORAL | Status: DC

## 2016-04-25 MED ORDER — TAMSULOSIN HCL 0.4 MG PO CAPS
0.4000 mg | ORAL_CAPSULE | Freq: Once | ORAL | Status: AC
  Administered 2016-04-25: 0.4 mg via ORAL
  Filled 2016-04-25: qty 1

## 2016-04-25 MED ORDER — OXYCODONE-ACETAMINOPHEN 5-325 MG PO TABS
2.0000 | ORAL_TABLET | Freq: Once | ORAL | Status: AC
  Administered 2016-04-25: 2 via ORAL
  Filled 2016-04-25: qty 2

## 2016-04-25 MED ORDER — OXYCODONE-ACETAMINOPHEN 5-325 MG PO TABS: 1.0000 | ORAL_TABLET | Freq: Four times a day (QID) | ORAL | Status: DC | PRN

## 2016-04-25 NOTE — ED Provider Notes (Signed)
CSN: [id number]     Arrival date & time 04/25/16  1052 History  By signing my name below, I, Anita Warren, attest that this documentation has been prepared under the direction and in the presence of Anita Pew, MD.  Electronically Signed: [name]. Sheppard Coil, ED Scribe. 04/25/2016. 12:12 PM.     Chief Complaint  Patient presents with  . Flank Pain   The history is provided by the patient. No language interpreter was used.   HPI Comments: Anita Warren is a 55 y.o. female who presents to the Emergency Department complaining of sudden onset, constant, severe, radiating right side flank pain to lower abdomen x PTA. Pt states pain woke her up out her sleep early on 04/25/16. Pt has associated nausea and urinary frequency. Pt reports she has history of kidney stones and believes she has another one which is causing pain. She has had 5 bowel movements since she woke up. Pain is relieved when she bends over onto hospital bed. Denies any fever or vomiting.   Past Medical History  Diagnosis Date  . Nonischemic cardiomyopathy (HCC)     LVEF 5-10%, likely viral (no CAD by cath 01/30/11)  . Essential hypertension, benign   . NSVT (nonsustained ventricular tachycardia) ([place])   . Obesity   . Hydronephrosis with renal and ureteral calculus obstruction 09/05/2013  . Chronic systolic heart failure ([place])   . History of medication noncompliance    Past Surgical History  Procedure Laterality Date  . Tonsillectomy    . Breast lumpectomy  1989    L breast- benign  . Tubal ligation    . Cholecystectomy    . Cardiac defibrillator placement  5/12    SJM by JA  . Colonoscopy  07/02/2012    Procedure: COLONOSCOPY;  Surgeon: [name], MD;  Location: [facility];  Service: Endoscopy;  Laterality: N/A;  1:15/PATIENT HAS A DEFIBRILLATOR  . Cystoscopy w/ ureteral stent placement Right 09/06/2013    Procedure: CYSTOSCOPY WITH RIGHT RETROGRADE PYELOGRAM; RIGHT URETERAL STENT PLACEMENT;   Surgeon: [name], MD;  Location: [facility] ORS;  Service: Urology;  Laterality: Right;  . Cesarean section      x 2   Family History  Problem Relation Age of Onset  . Hypertension    . Diabetes    . Colon cancer Neg Hx    Social History  Substance Use Topics  . Smoking status: Never Smoker   . Smokeless tobacco: Never Used  . Alcohol Use: No   OB History    No data available     Review of Systems  Constitutional: Negative for fever.  Gastrointestinal: Positive for nausea. Negative for vomiting.  Genitourinary: Positive for frequency and flank pain.    Allergies  Peanut-containing drug products; Ace inhibitors; Morphine; and Demerol  Home Medications   Prior to Admission medications   Medication Sig Start Date End Date Taking? Authorizing Provider  acetaminophen (TYLENOL) 500 MG tablet Take 1,000 mg by mouth every 6 (six) hours as needed for moderate pain.   Yes Historical Provider, MD  amiodarone (PACERONE) 200 MG tablet TAKE 1 TABLET BY MOUTH ONCE DAILY 01/11/16  Yes Jolaine Artist, MD  apixaban (ELIQUIS) 5 MG TABS tablet Take 1 tablet (5 mg total) by mouth 2 (two) times daily. 10/15/15  Yes Jolaine Artist, MD  carvedilol (COREG) 12.5 MG tablet Take 1.5 tablets (18.75 mg total) by mouth 2 (two) times daily with a meal. 10/02/15  Yes Elby Showers  [name], MD  Coenzyme Q10 (CO Q 10) 100 MG CAPS Take 100 mg by mouth daily.    Yes Historical Provider, MD  DIGITEK 125 MCG tablet TAKE 1 TABLET BY MOUTH DAILY. 02/26/16  Yes Jolaine Artist, MD  furosemide (LASIX) 40 MG tablet Take 1 tablet (40 mg total) by mouth 2 (two) times a week. Every Mon and Fri 09/26/14  Yes Shaune Pascal Bensimhon, MD  hydrALAZINE (APRESOLINE) 100 MG tablet TAKE 1 TABLET BY MOUTH 3 TIMES DAILY. 12/19/15  Yes Amy D Clegg, NP  isosorbide mononitrate (IMDUR) 30 MG 24 hr tablet TAKE 3 TABLETS BY MOUTH ONCE DAILY 08/02/15  Yes Jolaine Artist, MD  Magnesium Oxide 400 (240 MG) MG TABS Take 1 tablet by mouth  daily. 08/10/14  Yes Shaune Pascal Bensimhon, MD  potassium chloride SA (K-DUR,KLOR-CON) 20 MEQ tablet TAKE 2 TABLETS BY MOUTH DAILY. 08/13/15  Yes Jolaine Artist, MD  spironolactone (ALDACTONE) 25 MG tablet TAKE 1 TABLET BY MOUTH DAILY. 11/12/15  Yes Jolaine Artist, MD  ibuprofen (ADVIL,MOTRIN) 800 MG tablet Take 1 tablet (800 mg total) by mouth 3 (three) times daily. 04/25/16   Anita Pew, MD  metoCLOPramide (REGLAN) 10 MG tablet Take 1 tablet (10 mg total) by mouth every 6 (six) hours as needed for nausea or vomiting. 04/25/16   Anita Pew, MD  oxyCODONE-acetaminophen (PERCOCET/ROXICET) 5-325 MG tablet Take 1-2 tablets by mouth every 6 (six) hours as needed for severe pain. 04/25/16   Anita Pew, MD  tamsulosin (FLOMAX) 0.4 MG CAPS capsule Take 1 capsule (0.4 mg total) by mouth daily. 04/25/16   Anita Pew, MD   BP 112/60 mmHg  Pulse 55  Temp(Src) 98.2 F (36.8 C)  Resp 16  Ht 5\' 4"  (1.626 m)  Wt 168 lb (76.204 kg)  BMI 28.82 kg/m2  SpO2 96%  LMP 10/28/1999 Physical Exam  Constitutional: She is oriented to person, place, and time. She appears well-developed and well-nourished. No distress.  HENT:  Head: Normocephalic and atraumatic.  Eyes: Conjunctivae are normal.  Cardiovascular: Normal rate, regular rhythm and normal heart sounds.   Pulmonary/Chest: Effort normal.  Abdominal: Soft. Bowel sounds are normal. She exhibits no distension. There is no tenderness.  No abdominal tenderness.  Musculoskeletal: She exhibits tenderness.  Right flank tender to palpation  Neurological: She is alert and oriented to person, place, and time.  Skin: Skin is warm and dry. No rash noted.  Psychiatric: She has a normal mood and affect.  Nursing note and vitals reviewed.   ED Course  Procedures (including critical care time) DIAGNOSTIC STUDIES: Oxygen Saturation is 97% on RA, normal by my interpretation.    COORDINATION OF CARE: 11:26 AM-Discussed treatment plan which includes CT Renal  stone study and UA with pt at bedside and pt agreed to plan. Will order Zofran, Flomax, and Dilaudid.  Labs Review Labs Reviewed  URINALYSIS, ROUTINE W REFLEX MICROSCOPIC (NOT AT [facility]) - Abnormal; Notable for the following:    Specific Gravity, Urine <1.005 (*)    Leukocytes, UA TRACE (*)    All other components within normal limits  CBC WITH DIFFERENTIAL/PLATELET - Abnormal; Notable for the following:    RBC 3.40 (*)    Hemoglobin 9.9 (*)    HCT 30.5 (*)    All other components within normal limits  BASIC METABOLIC PANEL - Abnormal; Notable for the following:    Glucose, Bld 174 (*)    Creatinine, Ser 1.40 (*)    Calcium 8.4 (*)  GFR calc non Af Amer 42 (*)    GFR calc Af Amer 48 (*)    All other components within normal limits  URINE MICROSCOPIC-ADD ON - Abnormal; Notable for the following:    Squamous Epithelial / LPF 0-5 (*)    All other components within normal limits    Imaging Review Ct Renal Stone Study  04/25/2016  CLINICAL DATA:  Acute right flank pain. EXAM: CT ABDOMEN AND PELVIS WITHOUT CONTRAST TECHNIQUE: Multidetector CT imaging of the abdomen and pelvis was performed following the standard protocol without IV contrast. COMPARISON:  CT scan of September 03, 2013. FINDINGS: Visualized lung bases are unremarkable. No significant osseous abnormality is noted. Status post cholecystectomy. Stable low density is seen peripherally in right hepatic lobe most consistent with hemangioma. The spleen and pancreas are unremarkable on these unenhanced images. Adrenal glands appear normal. Left kidney and ureter appear normal. Atherosclerosis of abdominal aorta is noted without aneurysm formation. Moderate right hydroureteronephrosis is noted secondary to 11 mm calculus in distal right ureter. Calcified phlebolith is noted in dilated right ovarian vein. Urinary bladder appears normal. There is no evidence of bowel obstruction. No abnormal fluid collection is noted. IMPRESSION: Aortic  atherosclerosis. Moderate right hydroureteronephrosis is noted secondary to 11 mm distal right ureteral calculus. Electronically Signed   By: Marijo Conception, M.D.   On: 04/25/2016 12:15   I have personally reviewed and evaluated these images and lab results as part of my medical decision-making.   MDM   Final diagnoses:  Kidney stone     55 year old female here with that 11 mm right ureterolithiasis. Has had stones his big before and not able to pass them. However her pain and nausea are controlled at this time. I discussed with oncall urologist who recommends close office follow up as long as pain and vomiting are improved. Will return here for any worsening renal colic.   New Prescriptions: Discharge Medication List as of 04/25/2016  2:12 PM    START taking these medications   Details  ibuprofen (ADVIL,MOTRIN) 800 MG tablet Take 1 tablet (800 mg total) by mouth 3 (three) times daily., Starting 04/25/2016, Until Discontinued, Print    metoCLOPramide (REGLAN) 10 MG tablet Take 1 tablet (10 mg total) by mouth every 6 (six) hours as needed for nausea or vomiting., Starting 04/25/2016, Until Discontinued, Print    oxyCODONE-acetaminophen (PERCOCET/ROXICET) 5-325 MG tablet Take 1-2 tablets by mouth every 6 (six) hours as needed for severe pain., Starting 04/25/2016, Until Discontinued, Print    tamsulosin (FLOMAX) 0.4 MG CAPS capsule Take 1 capsule (0.4 mg total) by mouth daily., Starting 04/25/2016, Until Discontinued, Print         I have personally and contemperaneously reviewed labs and imaging and used in my decision making as above.   A medical screening exam was performed and I feel the patient has had an appropriate workup for their chief complaint at this time and likelihood of emergent condition existing is low and thus workup can continue on an outpatient basis.. Their vital signs are stable. They have been counseled on decision, discharge, follow up and which symptoms necessitate  immediate return to the emergency department.  They verbally stated understanding and agreement with plan and discharged in stable condition.   I personally performed the services described in this documentation, which was scribed in my presence. The recorded information has been reviewed and is accurate.  Anita Pew, MD 04/25/16 [phone]

## 2016-04-25 NOTE — ED Notes (Signed)
In to room to give Flomax. Patient continuing to vomit. MD notified and verbal order for 10 mg Reglan IV.

## 2016-04-25 NOTE — ED Notes (Signed)
Pt made aware to return if symptoms worsen or if any life threatening symptoms occur.   

## 2016-04-25 NOTE — ED Notes (Signed)
Pt not given flomax at this time due to pt actively vomiting.  Pt told that nurse would administer flomax after IV zofran has been given time to get into system.

## 2016-04-25 NOTE — ED Notes (Signed)
C/o right flank pain, rate pain 10/10.  History of kidney stones.

## 2016-04-28 ENCOUNTER — Encounter (HOSPITAL_COMMUNITY): Payer: Self-pay | Admitting: *Deleted

## 2016-04-28 ENCOUNTER — Ambulatory Visit (HOSPITAL_COMMUNITY): Payer: Medicare Other | Admitting: Anesthesiology

## 2016-04-28 ENCOUNTER — Encounter (HOSPITAL_COMMUNITY)
Admission: RE | Disposition: A | Discharge status: Home / Self Care | Payer: Self-pay | Source: Ambulatory Visit | Attending: Urology

## 2016-04-28 ENCOUNTER — Ambulatory Visit (HOSPITAL_COMMUNITY)
Admission: RE | Admit: 2016-04-28 | Discharge: 2016-04-28 | Disposition: A | Discharge status: Home / Self Care | Payer: Medicare Other | Source: Ambulatory Visit | Attending: Urology | Admitting: Urology

## 2016-04-28 ENCOUNTER — Other Ambulatory Visit: Payer: Self-pay | Admitting: Urology

## 2016-04-28 DIAGNOSIS — I110 Hypertensive heart disease with heart failure: Secondary | ICD-10-CM | POA: Diagnosis not present

## 2016-04-28 DIAGNOSIS — N132 Hydronephrosis with renal and ureteral calculous obstruction: Secondary | ICD-10-CM | POA: Diagnosis not present

## 2016-04-28 DIAGNOSIS — I472 Ventricular tachycardia: Secondary | ICD-10-CM | POA: Insufficient documentation

## 2016-04-28 DIAGNOSIS — Z95810 Presence of automatic (implantable) cardiac defibrillator: Secondary | ICD-10-CM | POA: Insufficient documentation

## 2016-04-28 DIAGNOSIS — Z7901 Long term (current) use of anticoagulants: Secondary | ICD-10-CM | POA: Insufficient documentation

## 2016-04-28 DIAGNOSIS — R8271 Bacteriuria: Secondary | ICD-10-CM | POA: Insufficient documentation

## 2016-04-28 DIAGNOSIS — I509 Heart failure, unspecified: Secondary | ICD-10-CM | POA: Insufficient documentation

## 2016-04-28 DIAGNOSIS — I129 Hypertensive chronic kidney disease with stage 1 through stage 4 chronic kidney disease, or unspecified chronic kidney disease: Secondary | ICD-10-CM | POA: Diagnosis not present

## 2016-04-28 DIAGNOSIS — N183 Chronic kidney disease, stage 3 (moderate): Secondary | ICD-10-CM | POA: Diagnosis not present

## 2016-04-28 DIAGNOSIS — Z79899 Other long term (current) drug therapy: Secondary | ICD-10-CM | POA: Diagnosis not present

## 2016-04-28 DIAGNOSIS — N201 Calculus of ureter: Secondary | ICD-10-CM

## 2016-04-28 DIAGNOSIS — N1330 Unspecified hydronephrosis: Secondary | ICD-10-CM | POA: Diagnosis not present

## 2016-04-28 DIAGNOSIS — Z87442 Personal history of urinary calculi: Secondary | ICD-10-CM | POA: Insufficient documentation

## 2016-04-28 HISTORY — PX: CYSTOSCOPY W/ URETERAL STENT PLACEMENT: SHX1429

## 2016-04-28 LAB — BASIC METABOLIC PANEL
ANION GAP: 8 (ref 5–15)
BUN: 30 mg/dL — ABNORMAL HIGH (ref 6–20)
CALCIUM: 8.4 mg/dL — AB (ref 8.9–10.3)
CHLORIDE: 101 mmol/L (ref 101–111)
CO2: 26 mmol/L (ref 22–32)
Creatinine, Ser: 2.67 mg/dL — ABNORMAL HIGH (ref 0.44–1.00)
GFR, EST AFRICAN AMERICAN: 22 mL/min — AB (ref >60)
GFR, EST NON AFRICAN AMERICAN: 19 mL/min — AB (ref >60)
GLUCOSE: 123 mg/dL — AB (ref 65–99)
POTASSIUM: 4.1 mmol/L (ref 3.5–5.1)
SODIUM: 135 mmol/L (ref 135–145)

## 2016-04-28 LAB — PROTIME-INR
INR: 2.95 — AB (ref 0.00–1.49)
PROTHROMBIN TIME: 29.3 s — AB (ref 11.6–15.2)

## 2016-04-28 LAB — HEMOGLOBIN: Hemoglobin: 9.6 g/dL — ABNORMAL LOW (ref 12.0–15.0)

## 2016-04-28 SURGERY — CYSTOSCOPY, WITH RETROGRADE PYELOGRAM AND URETERAL STENT INSERTION
Anesthesia: Monitor Anesthesia Care

## 2016-04-28 MED ORDER — PROPOFOL 500 MG/50ML IV EMUL
INTRAVENOUS | Status: DC | PRN
  Administered 2016-04-28: 50 ug/kg/min via INTRAVENOUS

## 2016-04-28 MED ORDER — PROPOFOL 10 MG/ML IV BOLUS
INTRAVENOUS | Status: AC
  Filled 2016-04-28: qty 20

## 2016-04-28 MED ORDER — MEPERIDINE HCL 50 MG/ML IJ SOLN: 6.2500 mg | INTRAMUSCULAR | Status: DC | PRN

## 2016-04-28 MED ORDER — CEFTRIAXONE SODIUM 2 G IJ SOLR
INTRAMUSCULAR | Status: AC
  Filled 2016-04-28: qty 2

## 2016-04-28 MED ORDER — ONDANSETRON HCL 4 MG/2ML IJ SOLN: 4.0000 mg | Freq: Once | INTRAMUSCULAR | Status: DC | PRN

## 2016-04-28 MED ORDER — LACTATED RINGERS IV SOLN
INTRAVENOUS | Status: DC
  Administered 2016-04-28: 16:00:00 via INTRAVENOUS

## 2016-04-28 MED ORDER — LIDOCAINE HCL 2 % EX GEL
CUTANEOUS | Status: DC | PRN
  Administered 2016-04-28: 1 via URETHRAL

## 2016-04-28 MED ORDER — SODIUM CHLORIDE 0.9 % IR SOLN
Status: DC | PRN
  Administered 2016-04-28: 3000 mL

## 2016-04-28 MED ORDER — IOHEXOL 300 MG/ML  SOLN
INTRAMUSCULAR | Status: DC | PRN
  Administered 2016-04-28: 50 mL via INTRAVENOUS

## 2016-04-28 MED ORDER — LIDOCAINE HCL (CARDIAC) 20 MG/ML IV SOLN
INTRAVENOUS | Status: DC | PRN
  Administered 2016-04-28: 50 mg via INTRATRACHEAL

## 2016-04-28 MED ORDER — PROPOFOL 10 MG/ML IV BOLUS
INTRAVENOUS | Status: DC | PRN
  Administered 2016-04-28 (×7): 20 mg via INTRAVENOUS

## 2016-04-28 MED ORDER — DEXTROSE 5 % IV SOLN
2.0000 g | INTRAVENOUS | Status: AC
  Administered 2016-04-28: 2 g via INTRAVENOUS

## 2016-04-28 MED ORDER — SULFAMETHOXAZOLE-TRIMETHOPRIM 800-160 MG PO TABS: 1.0000 | ORAL_TABLET | Freq: Two times a day (BID) | ORAL | Status: DC

## 2016-04-28 MED ORDER — LIDOCAINE HCL 2 % EX GEL
CUTANEOUS | Status: AC
  Filled 2016-04-28: qty 5

## 2016-04-28 MED ORDER — FENTANYL CITRATE (PF) 100 MCG/2ML IJ SOLN
INTRAMUSCULAR | Status: AC
  Filled 2016-04-28: qty 2

## 2016-04-28 MED ORDER — LIDOCAINE HCL (CARDIAC) 20 MG/ML IV SOLN
INTRAVENOUS | Status: AC
  Filled 2016-04-28: qty 5

## 2016-04-28 MED ORDER — MORPHINE SULFATE (PF) 10 MG/ML IV SOLN: 1.0000 mg | INTRAVENOUS | Status: DC | PRN

## 2016-04-28 MED ORDER — FENTANYL CITRATE (PF) 100 MCG/2ML IJ SOLN
INTRAMUSCULAR | Status: DC | PRN
  Administered 2016-04-28 (×2): 25 ug via INTRAVENOUS

## 2016-04-28 SURGICAL SUPPLY — 16 items
BAG URO CATCHER STRL LF (MISCELLANEOUS) ×3 IMPLANT
BASKET ZERO TIP NITINOL 2.4FR (BASKET) IMPLANT
BSKT STON RTRVL ZERO TP 2.4FR (BASKET)
CATH INTERMIT  6FR 70CM (CATHETERS) ×3 IMPLANT
CATH URET 5FR 28IN CONE TIP (BALLOONS)
CATH URET 5FR 70CM CONE TIP (BALLOONS) IMPLANT
GLOVE BIOGEL M STRL SZ7.5 (GLOVE) ×3 IMPLANT
GOWN STRL REUS W/TWL LRG LVL3 (GOWN DISPOSABLE) ×3 IMPLANT
GOWN STRL REUS W/TWL XL LVL3 (GOWN DISPOSABLE) ×3 IMPLANT
GUIDEWIRE STR DUAL SENSOR (WIRE) ×3 IMPLANT
MANIFOLD NEPTUNE II (INSTRUMENTS) ×3 IMPLANT
PACK CYSTO (CUSTOM PROCEDURE TRAY) ×3 IMPLANT
STENT URET 6FRX24 CONTOUR (STENTS) ×3 IMPLANT
TUBING CONNECTING 10 (TUBING) ×2 IMPLANT
TUBING CONNECTING 10' (TUBING) ×1
WIRE COONS/BENSON .038X145CM (WIRE) IMPLANT

## 2016-04-28 NOTE — Anesthesia Postprocedure Evaluation (Signed)
Anesthesia Post Note  Patient: Anita Warren  Procedure(s) Performed: Procedure(s) (LRB): CYSTOSCOPY WITH  RIGHT RETROGRADE PYELOGRAM/RIGHT URETERAL STENT PLACEMENT (N/A)  Patient location during evaluation: PACU Anesthesia Type: MAC Level of consciousness: awake Pain management: pain level controlled Vital Signs Assessment: post-procedure vital signs reviewed and stable Respiratory status: spontaneous breathing Cardiovascular status: stable Postop Assessment: no signs of nausea or vomiting Anesthetic complications: no     Last Vitals:  Filed Vitals:   04/28/16 1742 04/28/16 1749  BP: 137/67 132/57  Pulse: 66 69  Temp: 37.7 C 37.2 C  Resp: 14 14    Last Pain:  Filed Vitals:   04/28/16 1758  PainSc: 0-No pain   Pain Goal:                 Odes Lolli JR,JOHN Jonahtan Manseau

## 2016-04-28 NOTE — Progress Notes (Signed)
Notified pharmacy for med rec to be complete

## 2016-04-28 NOTE — Discharge Instructions (Signed)
[facility] [phone] Post Ureteroscopy With or Without Stent Instructions  Definitions:  Ureter: The duct that transports urine from the kidney to the bladder. Stent:   A plastic hollow tube that is placed into the ureter, from the kidney to the                 bladder to prevent the ureter from swelling shut.  GENERAL INSTRUCTIONS:  Despite the fact that no skin incisions were used, the area around the ureter and bladder is raw and irritated. The stent is a foreign body which will further irritate the bladder wall. This irritation is manifested by increased frequency of urination, both day and night, and by an increase in the urge to urinate. In some, the urge to urinate is present almost always. Sometimes the urge is strong enough that you may not be able to stop yourself from urinating. The only real cure is to remove the stent and then give time for the bladder wall to heal which can't be done until the danger of the ureter swelling shut has passed, which varies.  You may see some blood in your urine while the stent is in place and a few days afterwards. Do not be alarmed, even if the urine was clear for a while. Get off your feet and drink lots of fluids until clearing occurs. If you start to pass clots or don't improve, call us.  DIET: You may return to your normal diet immediately. Because of the raw surface of your bladder, alcohol, spicy foods, acid type foods and drinks with caffeine may cause irritation or frequency and should be used in moderation. To keep your urine flowing freely and to avoid constipation, drink plenty of fluids during the day ( 8-10 glasses ). Tip: Avoid cranberry juice because it is very acidic.  ACTIVITY: Your physical activity doesn't need to be restricted. However, if you are very active, you may see some blood in your urine. We suggest that you reduce your activity under these circumstances until the bleeding has stopped.  BOWELS: It is  important to keep your bowels regular during the postoperative period. Straining with bowel movements can cause bleeding. A bowel movement every other day is reasonable. Use a mild laxative if needed, such as Milk of Magnesia 2-3 tablespoons, or 2 Dulcolax tablets. Call if you continue to have problems. If you have been taking narcotics for pain, before, during or after your surgery, you may be constipated. Take a laxative if necessary.   MEDICATION: You should resume your pre-surgery medications unless told not to. In addition you will often be given an antibiotic to prevent infection. These should be taken as prescribed until the bottles are finished unless you are having an unusual reaction to one of the drugs.  PROBLEMS YOU SHOULD REPORT TO [place]:  Fevers over 100.5 Fahrenheit.  Heavy bleeding, or clots ( See above notes about blood in urine ).  Inability to urinate.  Drug reactions ( hives, rash, nausea, vomiting, diarrhea ).  Severe burning or pain with urination that is not improving.  FOLLOW-UP: You will need a follow-up appointment to monitor your progress and to arrange for definitive management of the stone and eventual removal of your stent. He had been set up with a follow-up appointment on July 18 at 10:30 AM with Dr.Dahlstedt at the [place] [facility] office at [facility]

## 2016-04-28 NOTE — H&P (Signed)
Office Visit Report     04/28/2016   --------------------------------------------------------------------------------   Anita Warren. Darrick Meigs  MRN: [id number]  PRIMARY CARE:  [employer] [employer], MD  DOB: May 17, 1961, 55 year old Female  REFERRING:    SSN: [id number]  PROVIDER:  Rana Snare, M.D.    LOCATION:  [facility] [phone]   --------------------------------------------------------------------------------   CC: I have ureteral stone.  HPI: Anita Warren is a 55 year-old female established patient who is here for ureteral stone.  The problem is on the right side. . She is currently having flank pain, back pain, nausea, vomiting, and chills. She denies having groin pain and fever. Pain is occuring on the right side. She has not caught a stone in her urine strainer since her symptoms began.   She has had ureteral stent for treatment of her stones in the past.   History of stones 5 or 6 times in the past.Continued moderate right flank pain with nausea and emesis some chills.Urine is nitrite positive. There is also bacteria noted. Apparently urology on-call Friday was contacted and recommended follow-up in the office.Currently afebrile and nontoxic in appearance. Reviewed images and reports. 11 mm right noted with moderate hydronephrosis.     ALLERGIES: ACE Inhibitors Demerol SOLN Morphine Derivatives    MEDICATIONS: Amiodarone Hcl 200 mg tablet 1 tablet PO Daily  Carvedilol 25 MG Oral Tablet Oral  Coq-10  Coreg 12.5 MG Oral Tablet Oral  Eliquis 5 mg tablet  Furosemide 40 mg tablet  HydrALAZINE HCl - 10 MG Oral Tablet Oral  Isosorbide Mononitrate Er 30 mg tablet, extended release 24 hr  Magnesium Oxide 400 mg tablet  Metoclopramide Hcl 10 mg tablet  Oxycodone-Acetaminophen 5 mg-325 mg tablet  Potassium Chloride 20 MEQ Oral Packet Oral  Spironolactone-HCTZ 25-25 MG Oral Tablet Oral  Tamsulosin HCl - 0.4 MG Oral Capsule 1 Oral Daily     GU PSH:  Hysterectomy Unilat SO - 2015      PSH Notes: Cardio-defib Pulse Generator Implantation Date, Cholecystectomy, Breast Surgery Lumpectomy, Hysterectomy, Cesarean Section, Tonsillectomy   NON-GU PSH: Cesarean Delivery Only - 2015 Cholecystectomy - 2015 Remove Breast Lesion - 2015 Remove Tonsils - 2015    GU PMH: Hydronephrosis Unspec, Hydronephrosis, right - 2015 Kidney Stone, Nephrolithiasis - 2015 Personal Hx urinary calculi, History of renal calculi - 2015    NON-GU PMH: Personal history of other diseases of the circulatory system, History of heart failure - 2015, History of cardiac disorder, - 2015, History of hypertension, - 2015 Personal history of other diseases of the respiratory system, History of asthma - 2015 Encounter for general adult medical examination without abnormal findings, Encounter for preventive health examination    FAMILY HISTORY: Death - Runs In Family   SOCIAL HISTORY: Marital Status: Married Current Smoking Status: Patient has never smoked.  Has never drank.      Notes: Number of children, Occupation, Married   REVIEW OF SYSTEMS:    GU Review Female:   Patient reports frequent urination, get up at night to urinate, and stream starts and stops. Patient denies hard to postpone urination, burning /pain with urination, leakage of urine, trouble starting your stream, have to strain to urinate, and currently pregnant.  Gastrointestinal (Upper):   Patient reports nausea and vomiting. Patient denies indigestion/ heartburn.  Gastrointestinal (Lower):   Patient denies diarrhea and constipation.  Constitutional:   Patient reports fever. Patient denies night sweats, weight loss, and fatigue.  Skin:   Patient denies skin rash/ lesion and itching.  Eyes:   Patient denies blurred vision and double vision.  Ears/ Nose/ Throat:   Patient denies sore throat and sinus problems.  Hematologic/Lymphatic:   Patient denies swollen glands and easy bruising.  Cardiovascular:    Patient denies leg swelling and chest pains.  Respiratory:   Patient denies cough and shortness of breath.  Endocrine:   Patient denies excessive thirst.  Musculoskeletal:   Patient reports back pain. Patient denies joint pain.  Neurological:   Patient reports headaches and dizziness.   Psychologic:   Patient denies depression and anxiety.   VITAL SIGNS:      04/28/2016 10:22 AM  Weight 190 lb / 86.18 kg  Height 65 in / 165.1 cm  BP 98/59 mmHg  Pulse 79 /min  Temperature 99.0 F / 37 C  BMI 31.6 kg/m   MULTI-SYSTEM PHYSICAL EXAMINATION:    Constitutional: Well-nourished. No physical deformities. Normally developed. Good grooming.   Respiratory: No labored breathing, no use of accessory muscles.   Cardiovascular: Normal temperature, normal extremity pulses, no swelling, no varicosities.   Neurologic / Psychiatric: Oriented to time, oriented to place, oriented to person. No depression, no anxiety, no agitation.   Gastrointestinal: No mass, no tenderness, no rigidity, non obese abdomen.   Musculoskeletal: Normal gait and station of head and neck.     PAST DATA REVIEWED:  Source Of History:  Patient  X-Ray Review: C.T. Abdomen/Pelvis: Reviewed Films. Reviewed Report. Discussed With Patient.     PROCEDURES:          Urinalysis w/Scope - 81001 Dipstick Dipstick Cont'd Micro  Specimen: Voided Bilirubin: Neg WBC/hpf: 0-5/hpf  Color: Amber Ketones: Neg RBC/hpf: 0-2/hpf  Appearance: Cloudy Blood: Neg Bacteria: Many (>50/hpf)  Specific Gravity: 1.025 Protein: 2+ Cystals: Amorph Urates  pH: 5.5 Urobilinogen: 4.0 Casts: Granular  Glucose: Neg Nitrites: Positive Trichomonas: Not Present    Leukocyte Esterase: Neg Mucous: Not Present      Epithelial Cells: 0-5/hpf      Yeast: NS (Not Seen)      Sperm: Not Present         Ketoralac 30mg  - [id number], [id number] Qty: 30 Adm. By: Brandy May  Unit: mg Lot No [id number]  Route: IM Exp. Date 08/25/2017  Freq: None Mfgr.:   Site: Right Hip          Phenergan 25mg  - [id number], [id number] Qty: 25 Adm. By: Brandy May  Unit: mg Lot No [id number]  Route: IM Exp. Date 05/25/2016  Freq: None Mfgr.:   Site: Left Hip   ASSESSMENT:      ICD-10 Details  1 GU:   Calculus Ureter - N20.1   2   Hydronephrosis Unspec - N13.30    PLAN:           Orders Labs Urine Culture and Sensitivity          Schedule Return Visit: ASAP - Schedule Surgery          Document Letter(s):  Created for Patient: Clinical Summary         Notes:   She has moderate ongoing nausea and right flank pain.There is no obvious evidence of febrile UTI or urosepsis but given the abnormal urine along with her current symptoms and very low likelihood of passing this stone spontaneously I think the prudent maneuver is deep placed a double-J stent today. We would not want to attempt to laser the stone given the potential concurrent bacteriuria. We will plan on double-J stent placement followed by follow-up with Dr.Dahlstedt  for definitive stone management. Risks and benefits of this procedure were discussed with her. She was given an injection of Toradol (reduced dose) as well as some Phenergan. We will add her on the surgical schedule for ASAP.   Cc Dr. Pierre Bali   * Signed by Rana Snare, M.D. on 04/28/16 at 11:38 AM (EDT     The information contained in this medical record document is considered private and confidential patient information. This information can only be used for the medical diagnosis and/or medical services that are being provided by the patient's selected caregivers. This information can only be distributed outside of the patient's care if the patient agrees and signs waivers of authorization for this information to be sent to an outside source or route.

## 2016-04-28 NOTE — Transfer of Care (Signed)
Immediate Anesthesia Transfer of Care Note  Patient: Anita Warren  Procedure(s) Performed: Procedure(s): CYSTOSCOPY WITH  RIGHT RETROGRADE PYELOGRAM/RIGHT URETERAL STENT PLACEMENT (N/A)  Patient Location: PACU  Anesthesia Type:MAC  Level of Consciousness: awake, alert , oriented and patient cooperative  Airway & Oxygen Therapy: Patient Spontanous Breathing and Patient connected to face mask oxygen  Post-op Assessment: Report given to RN and Post -op Vital signs reviewed and stable  Post vital signs: Reviewed and stable  Last Vitals:  Filed Vitals:   04/28/16 1330  BP: 107/52  Pulse: 72  Temp: 37.3 C  Resp: 18    Last Pain:  Filed Vitals:   04/28/16 1359  PainSc: 8          Complications: No apparent anesthesia complications

## 2016-04-28 NOTE — Anesthesia Preprocedure Evaluation (Signed)
Anesthesia Evaluation  Patient identified by MRN, date of birth, ID band Patient awake    Reviewed: Allergy & Precautions, H&P , NPO status , Patient's Chart, lab work & pertinent test results  History of Anesthesia Complications (+) PONV and history of anesthetic complications  Airway Mallampati: I  TM Distance: >3 FB     Dental no notable dental hx. (+) Teeth Intact   Pulmonary    Pulmonary exam normal        Cardiovascular hypertension, Pt. on medications +CHF  DVT: EF 10%  Normal cardiovascular exam+ dysrhythmias Ventricular Tachycardia + Cardiac Defibrillator III     Neuro/Psych    GI/Hepatic neg GERD  ,  Endo/Other    Renal/GU Renal disease     Musculoskeletal   Abdominal Normal abdominal exam  (+)   Peds  Hematology   Anesthesia Other Findings Nauseated this AM  Reproductive/Obstetrics                             Anesthesia Physical  Anesthesia Plan  ASA: III  Anesthesia Plan: MAC   Post-op Pain Management:    Induction: Intravenous  Airway Management Planned: Simple Face Mask  Additional Equipment:   Intra-op Plan:   Post-operative Plan:   Informed Consent: I have reviewed the patients History and Physical, chart, labs and discussed the procedure including the risks, benefits and alternatives for the proposed anesthesia with the patient or authorized representative who has indicated his/her understanding and acceptance.     Plan Discussed with: CRNA and Surgeon  Anesthesia Plan Comments: (Discussed possible SAB if Mac is insufficient.)        Anesthesia Quick Evaluation

## 2016-04-28 NOTE — Op Note (Signed)
Preoperative diagnosis: 11 mm right distal ureteral stone with hydronephrosis and bacteriuria Postoperative diagnosis: Same  Procedure: Cystoscopy with retrograde pyelogram with fluoroscopic interpretation and placement of a 6 French 24 cm double-J stent   Surgeon: Bernestine Amass M.D.  Anesthesia: Gen.  Indications: Prior history of nephrolithiasis and presented to the [facility] emergency room recently with severe right flank pain. She was diagnosed with 11 mm stone and significant hydronephrosis. She was told to follow-up in our office and presented today with ongoing significant pain as well as nausea and emesis. She was afebrile and nontoxic in appearance but did have some pyuria and bacteriuria. We felt the safest way to proceed was to place a double-J stent given her ongoing symptom complex as well as the concern over possible concurrent urinary tract infection. We did not feel it was appropriate to attempt definitive stone management. The patient has seen Dr. Marella Chimes stent in the past.     Technique and findings: Patient was brought the operating room where she had monitored anesthesia with IV sedation. Lidocaine jelly was instilled per urethra. The bladder showed some cloudy-appearing concentrated urine. Retrograde pyelography revealed a large filling defect in the distal ureter with significant high-grade obstruction. The retrograde pyelogram was done with fluoroscopic interpretation utilizing open-ended catheter.   Guidewire was placed to the right renal pelvis.. When/cloudy urine could be seen coming around the wire through the ureteral orifice. A 6 French 24 cm double-J stent was placed without difficulty and confirmed to be in good position fluoroscopically as well as visually. The patient was brought to PACU having had no obvious complications or problems.

## 2016-04-30 ENCOUNTER — Encounter (HOSPITAL_COMMUNITY): Payer: Self-pay | Admitting: Urology

## 2016-04-30 ENCOUNTER — Other Ambulatory Visit (HOSPITAL_COMMUNITY): Payer: Self-pay | Admitting: Cardiology

## 2016-04-30 ENCOUNTER — Other Ambulatory Visit (HOSPITAL_COMMUNITY): Payer: Self-pay | Admitting: Adult Health

## 2016-05-13 ENCOUNTER — Ambulatory Visit (INDEPENDENT_AMBULATORY_CARE_PROVIDER_SITE_OTHER): Payer: Medicare Other | Admitting: Urology

## 2016-05-13 DIAGNOSIS — N201 Calculus of ureter: Secondary | ICD-10-CM

## 2016-05-14 NOTE — ED Provider Notes (Signed)
Medical screening examination/treatment/procedure(s) were performed by non-physician practitioner and as supervising physician I was immediately available for consultation/collaboration.   EKG Interpretation None       Isla Pence, MD 05/14/16 1552

## 2016-05-15 ENCOUNTER — Other Ambulatory Visit (HOSPITAL_COMMUNITY): Payer: Self-pay

## 2016-05-15 DIAGNOSIS — I5022 Chronic systolic (congestive) heart failure: Secondary | ICD-10-CM

## 2016-05-28 ENCOUNTER — Telehealth (HOSPITAL_COMMUNITY): Payer: Self-pay | Admitting: *Deleted

## 2016-05-28 NOTE — Telephone Encounter (Signed)
Received note from Dr Risa Grill at [facility] stating pt has a large ureteral stone and needs holmium laser and extraction fo the stone, request pt stop eliquis for procedure.  Per Dr [name] "ok to hold Eliquis 2 days prior to procedure" note faxed back to [facility] at [phone]

## 2016-06-12 ENCOUNTER — Other Ambulatory Visit (HOSPITAL_COMMUNITY): Payer: Self-pay | Admitting: Internal Medicine

## 2016-06-23 ENCOUNTER — Ambulatory Visit (HOSPITAL_COMMUNITY)
Admission: RE | Admit: 2016-06-23 | Discharge: 2016-06-23 | Disposition: A | Discharge status: Home / Self Care | Payer: Medicare Other | Source: Ambulatory Visit | Attending: Internal Medicine | Admitting: Internal Medicine

## 2016-06-23 ENCOUNTER — Ambulatory Visit (HOSPITAL_BASED_OUTPATIENT_CLINIC_OR_DEPARTMENT_OTHER)
Admission: RE | Admit: 2016-06-23 | Discharge: 2016-06-23 | Disposition: A | Discharge status: Home / Self Care | Payer: Medicare Other | Source: Ambulatory Visit | Attending: Cardiology | Admitting: Cardiology

## 2016-06-23 VITALS — BP 151/78 | HR 51 | Wt 184.0 lb

## 2016-06-23 DIAGNOSIS — I313 Pericardial effusion (noninflammatory): Secondary | ICD-10-CM | POA: Diagnosis not present

## 2016-06-23 DIAGNOSIS — Z87898 Personal history of other specified conditions: Secondary | ICD-10-CM

## 2016-06-23 DIAGNOSIS — I5022 Chronic systolic (congestive) heart failure: Secondary | ICD-10-CM

## 2016-06-23 DIAGNOSIS — I480 Paroxysmal atrial fibrillation: Secondary | ICD-10-CM

## 2016-06-23 DIAGNOSIS — N183 Chronic kidney disease, stage 3 unspecified: Secondary | ICD-10-CM

## 2016-06-23 DIAGNOSIS — I340 Nonrheumatic mitral (valve) insufficiency: Secondary | ICD-10-CM | POA: Insufficient documentation

## 2016-06-23 DIAGNOSIS — I110 Hypertensive heart disease with heart failure: Secondary | ICD-10-CM | POA: Diagnosis not present

## 2016-06-23 DIAGNOSIS — I509 Heart failure, unspecified: Secondary | ICD-10-CM | POA: Diagnosis present

## 2016-06-23 LAB — CBC
HCT: 32.5 % — ABNORMAL LOW (ref 36.0–46.0)
Hemoglobin: 10.3 g/dL — ABNORMAL LOW (ref 12.0–15.0)
MCH: 28.9 pg (ref 26.0–34.0)
MCHC: 31.7 g/dL (ref 30.0–36.0)
MCV: 91.3 fL (ref 78.0–100.0)
PLATELETS: 217 10*3/uL (ref 150–400)
RBC: 3.56 MIL/uL — ABNORMAL LOW (ref 3.87–5.11)
RDW: 16.3 % — AB (ref 11.5–15.5)
WBC: 7.3 10*3/uL (ref 4.0–10.5)

## 2016-06-23 LAB — COMPREHENSIVE METABOLIC PANEL
ALT: 12 U/L — ABNORMAL LOW (ref 14–54)
ANION GAP: 7 (ref 5–15)
AST: 16 U/L (ref 15–41)
Albumin: 3.5 g/dL (ref 3.5–5.0)
Alkaline Phosphatase: 41 U/L (ref 38–126)
BILIRUBIN TOTAL: 0.5 mg/dL (ref 0.3–1.2)
BUN: 18 mg/dL (ref 6–20)
CO2: 26 mmol/L (ref 22–32)
Calcium: 9.1 mg/dL (ref 8.9–10.3)
Chloride: 106 mmol/L (ref 101–111)
Creatinine, Ser: 1.59 mg/dL — ABNORMAL HIGH (ref 0.44–1.00)
GFR calc Af Amer: 41 mL/min — ABNORMAL LOW (ref >60)
GFR calc non Af Amer: 36 mL/min — ABNORMAL LOW (ref >60)
GLUCOSE: 108 mg/dL — AB (ref 65–99)
POTASSIUM: 3.9 mmol/L (ref 3.5–5.1)
SODIUM: 139 mmol/L (ref 135–145)
TOTAL PROTEIN: 8.1 g/dL (ref 6.5–8.1)

## 2016-06-23 LAB — TSH: TSH: 1.073 u[IU]/mL (ref 0.350–4.500)

## 2016-06-23 MED ORDER — POTASSIUM CHLORIDE CRYS ER 20 MEQ PO TBCR: 40.0000 meq | EXTENDED_RELEASE_TABLET | ORAL | 0 refills | Status: DC

## 2016-06-23 NOTE — Progress Notes (Signed)
Patient ID: Anita Warren, female   DOB: 19-Aug-1961, 55 y.o.   MRN: [id number] Primary Physician: Glo Herring., MD  Primary Cardiologist: Carlyle Dolly MD [facility]) EP: Dr Rayann Heman  Urology: Dr Risa Grill  HPI: Anita Warren is a 55 y.o.female with history of viral, non-ischemic cardiomyopathy, severe systolic dysfunction with EF of 10% s/p ICD pacemaker in the setting of VT 02/2011 (St. Jude);  Most recent cath 2012 with no CAD.   Admitted 7/15 with acute on chronic systolic CHF she was not taking medications for 4 months because she lost her insurance. She was diuresed with IV lasix and transitioned to lasix 40 mg daily. She was also discharged on 12.5 mg carvedilol twice a day, hydralazine 25 mg tid, and 30 mg Imdur daily. Discharge weight was 176 pounds. She was not on an ACEI due to angioedema.  Admitted from HF clinic 06/02/14 with low output heart failure. Swan placed and showed cardiogenic shock. Ultimately discharged on  Milrinone 0.25 mcg/kg/min. Hospital stay was complicated by atrial flutter. Loaded on amiodarone and started on eliquis 5 mg twice a day. Met with Dr Prescott Gum and VAD coordinator. She was titrated off the milrinone.   Today she returns for HF follow up. Since the last visit she had kidey stone with hydronephrosis and required stent. Overall feeling ok. Mild dyspnea with exertion with steps. Weight at home 170-176 pounds. Having a little bit of hematuria from stent. No BRBPR. Appetite increased. Taking all meds.  She is no longer on ACEI or ARB because of recurrent angioedema.    05/23/14 ECHO EF 5-10% RV mod to severely dilated.  08/09/14 ECHO EF 20-25% RV read as normal 8/17 ECHO EF 50%, mild LV dilation, normal RV size and systolic function.   Labs:  HIV negative 2015  9/15: K 3.6 => 4.6, creatinine 0.99 => 1.5 => 1.2, Mg 1.9, HCT 45.2, LFTs normal, TSH normal 9/15: PFTs normal 07/31/14: Co-ox off milrinone 66%  K 4.5 Cr 1.36 08/23/14: k 4.4, cr 1.51 digoxin  0.7 09/20/14: K 4.9, creatinine 1.64  12/15: K 4.4, creatinine 1.36 2/16: K 4.2, creatinine 1.67, HCT 33.9 04/28/2016: K 4.1 Creatinine 2.67   2013 Colonoscopy Blood Type:  B+  Lower Extremity Doppler - Normal arterial flow Carotid Doppler-  1-39% stenosis involving the right internal carotid artery and the left internal carotid Artery.  SH: Disabled  Lives with husband and son. Has 3 grown children. Does not smoke or drink alcohol.. 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    FH: Mom died at 57 CAD         Sister and Brother HTN    ROS: All systems negative except as listed in HPI, PMH and Problem List.  Past Medical History:  Diagnosis Date  . Chronic systolic heart failure ([place])   . Essential hypertension, benign   . History of medication noncompliance   . Hydronephrosis with renal and ureteral calculus obstruction 09/05/2013  . Nonischemic cardiomyopathy ([facility])    LVEF 5-10%, likely viral (no CAD by cath 01/30/11)  . NSVT (nonsustained ventricular tachycardia) ([place])   . Obesity     Current Outpatient Prescriptions  Medication Sig Dispense Refill  . acetaminophen (TYLENOL) 500 MG tablet Take 1,000 mg by mouth every 6 (six) hours as needed for moderate pain.    Marland Kitchen amiodarone (PACERONE) 200 MG tablet TAKE 1 TABLET BY MOUTH ONCE DAILY 34 tablet PRN  . apixaban (ELIQUIS) 5 MG TABS tablet Take 1 tablet (5 mg total) by mouth 2 (  two) times daily. 180 tablet 3  . carvedilol (COREG) 12.5 MG tablet TAKE 1 & 1/2 TABLETS BY MOUTH 2 TIMES DAILY WITH A MEAL. 90 tablet PRN  . Coenzyme Q10 (CO Q 10) 100 MG CAPS Take 100 mg by mouth daily.     Marland Kitchen DIGITEK 125 MCG tablet TAKE 1 TABLET BY MOUTH DAILY. 30 tablet 5  . furosemide (LASIX) 40 MG tablet Take 1 tablet (40 mg total) by mouth 2 (two) times a week. Every Mon and Fri 90 tablet 3  . hydrALAZINE (APRESOLINE) 100 MG tablet TAKE 1 TABLET BY MOUTH 3 TIMES DAILY. 90 tablet PRN  . isosorbide mononitrate (IMDUR) 30 MG 24 hr tablet TAKE 3 TABLETS BY MOUTH ONCE  DAILY 90 tablet PRN  . Magnesium Oxide 400 (240 MG) MG TABS Take 1 tablet by mouth daily. 90 tablet 3  . potassium chloride SA (K-DUR,KLOR-CON) 20 MEQ tablet TAKE 2 TABLETS BY MOUTH DAILY. 180 tablet 0  . spironolactone (ALDACTONE) 25 MG tablet TAKE 1 TABLET BY MOUTH DAILY. 90 tablet 3  . metoCLOPramide (REGLAN) 10 MG tablet Take 1 tablet (10 mg total) by mouth every 6 (six) hours as needed for nausea or vomiting. (Patient not taking: Reported on 06/23/2016) 30 tablet 0   No current facility-administered medications for this encounter.      PHYSICAL EXAM: BP (!) 151/78   Pulse (!) 51   Wt 184 lb (83.5 kg)   LMP 10/28/1999   SpO2 100%   BMI 30.62 kg/m  General:  NAD   HEENT: normal Neck: supple. JVP 5-6. Carotids 2+ bilaterally; no bruits. No lymphadenopathy or thryomegaly appreciated. Cor: PMI normal. Regular rate & rhythm. No rubs, or murmurs. No S3/S4 Lungs: clear Abdomen: soft, nontender, nondistended. No hepatosplenomegaly. No bruits or masses. Good bowel sounds. Extremities: no cyanosis, clubbing, rash. Lower extremities cool. No edema.  Neuro: alert & orientedx3, cranial nerves grossly intact. Moves all 4 extremities w/o difficulty. Affect pleasant.  ASSESSMENT & PLAN: 1. Chronic Systolic Heart Failure: Has St Jude ICD 2012,  Nonischemic cardiomyopathy thought to potentially be due to viral myocarditis noted initially in 2011. Cath 2012 normal coronaries.  Echo (7/15) with EF 5-10% and RV mod-severely dilated with moderately decreased systolic function. Repeat echo 10/15 with EF up to 20-25%. She was on milrinone briefly, now off.  Today's ECHO was discussed and reviewed by Dr [name]. EF improved to 50%. Now NYHA I-II. Euvolemic on exam today.  - Continue Lasix 40 mg twice weekly. Cut potassium back to lasix days only.  - Continue spironolactone 25 mg daily but may need to stop based on today's BMET.  - No ACEI/ARB due to recurrent episodes of angioedema.  - Continue Coreg to  18.75 mg bid.  - Continue hydralazine 100 mg tid and Imdur 90 mg daily.   - Stop digoxin with improved EF.  2. Atrial fibrillation: Paroxysmal, NSR today.  - Continue Eliquis 5 mg bid. CBC today.  - Continue amiodarone.  Check LFTs and TSH today.  Will need regular eye exams on amiodarone. 3. Angioedema: Had in past on ACEI, had another episode after ACEI stopped.  She is no longer on ACEI or ARB. ?Hereditary or acquired C1 inhibitor deficiency. No further problems with this recently.  4. CKD: Creatinine recently elevated. BMET today.  5. R Hypdronephrosis with ureteral stone: Needs surgical clearance for stone removal.  Ok for surgery but will need to hold eliquis 2 days prior to surgery and restart once ok with urology.  Follow up in 4 months.   Amy Clegg  06/23/2016   Patient seen with NP, agree with the above note.  I reviewed her echo done today => EF improved to 50%.  Doing well from a cardiac standpoint but has right hydronephrosis and needs replacement of the ureteral stent on that side.   - She is stable for surgery, hold Eliquis 2 days prior.  - With EF improved considerably, stop digoxin.   - BMET today => may have to cut back on spironolactone.  - Follow LFTs and TSH today with amiodarone use.   Loralie Champagne 06/24/2016

## 2016-06-23 NOTE — Patient Instructions (Signed)
STOP Digoxin  CHANGE Potassium to one tab twice weekly, mondays and fridays with lasix dose  Labs today  Once a surgery date has been scheduled, hold Eliquis 2 days prior to surgery and restart as ordered per urology.  Your physician recommends that you schedule a follow-up appointment in: 4 months   Do the following things EVERYDAY: 1) Weigh yourself in the morning before breakfast. Write it down and keep it in a log. 2) Take your medicines as prescribed 3) Eat low salt foods-Limit salt (sodium) to 2000 mg per day.  4) Stay as active as you can everyday 5) Limit all fluids for the day to less than 2 liters

## 2016-06-23 NOTE — Progress Notes (Signed)
  Echocardiogram 2D Echocardiogram has been performed.  Tresa Res 06/23/2016, 11:44 AM

## 2016-06-26 ENCOUNTER — Telehealth (HOSPITAL_COMMUNITY): Payer: Self-pay | Admitting: *Deleted

## 2016-06-26 NOTE — Telephone Encounter (Signed)
Pt's OV note from 8/28 faxed to Dr Diona Fanti at [facility] in [place] at [phone]

## 2016-07-08 ENCOUNTER — Other Ambulatory Visit: Payer: Self-pay | Admitting: Urology

## 2016-07-09 NOTE — Progress Notes (Signed)
Surgery on 07/21/16.  preop on 07/16/16.  Orders need to be signed off on in EPIC.  Thank You!

## 2016-07-11 NOTE — Progress Notes (Signed)
Surgery on 9/25.  Preop on 9/20.  Orders needs to be signed off in EPIC.  Thank You!

## 2016-07-16 ENCOUNTER — Encounter (HOSPITAL_COMMUNITY)
Admission: RE | Admit: 2016-07-16 | Discharge: 2016-07-16 | Disposition: A | Discharge status: Home / Self Care | Payer: Medicare Other | Source: Ambulatory Visit | Attending: Urology | Admitting: Urology

## 2016-07-16 ENCOUNTER — Other Ambulatory Visit (HOSPITAL_COMMUNITY): Payer: Self-pay | Admitting: Pharmacist

## 2016-07-16 ENCOUNTER — Encounter (HOSPITAL_COMMUNITY): Payer: Self-pay

## 2016-07-16 DIAGNOSIS — Z01812 Encounter for preprocedural laboratory examination: Secondary | ICD-10-CM | POA: Insufficient documentation

## 2016-07-16 DIAGNOSIS — N201 Calculus of ureter: Secondary | ICD-10-CM | POA: Insufficient documentation

## 2016-07-16 HISTORY — DX: Personal history of urinary calculi: Z87.442

## 2016-07-16 HISTORY — DX: Presence of automatic (implantable) cardiac defibrillator: Z95.810

## 2016-07-16 LAB — CBC
HEMATOCRIT: 30.8 % — AB (ref 36.0–46.0)
Hemoglobin: 9.9 g/dL — ABNORMAL LOW (ref 12.0–15.0)
MCH: 28.9 pg (ref 26.0–34.0)
MCHC: 32.1 g/dL (ref 30.0–36.0)
MCV: 89.8 fL (ref 78.0–100.0)
PLATELETS: 195 10*3/uL (ref 150–400)
RBC: 3.43 MIL/uL — AB (ref 3.87–5.11)
RDW: 16.3 % — ABNORMAL HIGH (ref 11.5–15.5)
WBC: 7.6 10*3/uL (ref 4.0–10.5)

## 2016-07-16 LAB — BASIC METABOLIC PANEL
Anion gap: 7 (ref 5–15)
BUN: 26 mg/dL — AB (ref 6–20)
CALCIUM: 9.1 mg/dL (ref 8.9–10.3)
CHLORIDE: 106 mmol/L (ref 101–111)
CO2: 27 mmol/L (ref 22–32)
CREATININE: 1.67 mg/dL — AB (ref 0.44–1.00)
GFR calc non Af Amer: 34 mL/min — ABNORMAL LOW (ref >60)
GFR, EST AFRICAN AMERICAN: 39 mL/min — AB (ref >60)
Glucose, Bld: 108 mg/dL — ABNORMAL HIGH (ref 65–99)
Potassium: 3.5 mmol/L (ref 3.5–5.1)
Sodium: 140 mmol/L (ref 135–145)

## 2016-07-16 MED ORDER — APIXABAN 5 MG PO TABS: 5.0000 mg | ORAL_TABLET | Freq: Two times a day (BID) | ORAL | 3 refills | Status: DC

## 2016-07-16 NOTE — Pre-Procedure Instructions (Signed)
07-16-16 1100 AM note labs viewable in Epic.-CBC,BMP results

## 2016-07-16 NOTE — Patient Instructions (Signed)
AVEENA DEPHILLIPS  07/16/2016   Your procedure is scheduled on: 07-21-16   Report to [facility] Main  Entrance take [facility]  elevators to 3rd floor to  [place] AM.  Call this number if you have problems the morning of surgery [phone]   Remember: ONLY 1 PERSON MAY GO WITH YOU TO SHORT STAY TO GET  READY MORNING OF [place].  Do not eat food or drink liquids :After Midnight.     Take these medicines the morning of surgery with A SIP OF WATER: Amiodarone. Carvedilol. Isosorbide. (Eliquis use per MD instructions). DO NOT TAKE ANY DIABETIC MEDICATIONS DAY OF YOUR SURGERY                               You may not have any metal on your body including hair pins and              piercings  Do not wear jewelry, make-up, lotions, powders or perfumes, deodorant             Do not wear nail polish.  Do not shave  48 hours prior to surgery.              Men may shave face and neck.   Do not bring valuables to the hospital. [place].  Contacts, dentures or bridgework may not be worn into surgery.  Leave suitcase in the car. After surgery it may be brought to your room.     Patients discharged the day of surgery will not be allowed to drive home.  Name and phone number of your driver: Nicole Kindred- spouse [phone] cell  Special Instructions: N/A              Please read over the following fact sheets you were given: _____________________________________________________________________             [facility] - Preparing for Surgery Before surgery, you can play an important role.  Because skin is not sterile, your skin needs to be as free of germs as possible.  You can reduce the number of germs on your skin by washing with CHG (chlorahexidine gluconate) soap before surgery.  CHG is an antiseptic cleaner which kills germs and bonds with the skin to continue killing germs even after  washing. Please DO NOT use if you have an allergy to CHG or antibacterial soaps.  If your skin becomes reddened/irritated stop using the CHG and inform your nurse when you arrive at Short Stay. Do not shave (including legs and underarms) for at least 48 hours prior to the first CHG shower.  You may shave your face/neck. Please follow these instructions carefully:  1.  Shower with CHG Soap the night before surgery and the  morning of Surgery.  2.  If you choose to wash your hair, wash your hair first as usual with your  normal  shampoo.  3.  After you shampoo, rinse your hair and body thoroughly to remove the  shampoo.                           4.  Use CHG as you would any other liquid soap.  You can apply chg directly  to the skin and wash                       Gently with a scrungie or clean washcloth.  5.  Apply the CHG Soap to your body ONLY FROM THE NECK DOWN.   Do not use on face/ open                           Wound or open sores. Avoid contact with eyes, ears mouth and genitals (private parts).                       Wash face,  Genitals (private parts) with your normal soap.             6.  Wash thoroughly, paying special attention to the area where your surgery  will be performed.  7.  Thoroughly rinse your body with warm water from the neck down.  8.  DO NOT shower/wash with your normal soap after using and rinsing off  the CHG Soap.                9.  Pat yourself dry with a clean towel.            10.  Wear clean pajamas.            11.  Place clean sheets on your bed the night of your first shower and do not  sleep with pets. Day of Surgery : Do not apply any lotions/deodorants the morning of surgery.  Please wear clean clothes to the hospital/surgery center.  FAILURE TO FOLLOW THESE INSTRUCTIONS MAY RESULT IN THE CANCELLATION OF YOUR SURGERY PATIENT SIGNATURE_________________________________  NURSE  SIGNATURE__________________________________  ________________________________________________________________________

## 2016-07-16 NOTE — Pre-Procedure Instructions (Signed)
EKG 12'16/ Echo 8'17 Epic. Clinic visit note [place]. Awaiting signed Cardiac orders after initiated 07-16-16.

## 2016-07-18 ENCOUNTER — Encounter (HOSPITAL_COMMUNITY): Payer: Self-pay | Admitting: *Deleted

## 2016-07-18 ENCOUNTER — Encounter (HOSPITAL_COMMUNITY): Payer: Self-pay

## 2016-07-18 ENCOUNTER — Telehealth (HOSPITAL_COMMUNITY): Payer: Self-pay

## 2016-07-18 NOTE — Telephone Encounter (Signed)
D call from [facility] presurgical services regarding mutual patient, asking when last device follow up and interrogation appointment was. Advised our clinic only scans device for fluid management or for any reported/recent s/s of palpitations, arrythmias. Patient had device placed by Dr. Rayann Heman, however, has not followed up with him since 06/2013. Sent message to provider and [address] triage pool to see who I can reach out to in order to get her scheduled as she has an upcoming surgical procedure on the 25th.  Renee Pain, RN

## 2016-07-18 NOTE — Pre-Procedure Instructions (Addendum)
07-18-16 1145 Discussed pt with Dr. Delma Post- whether to have Device Rep  In house for procedure. He felt with review of Dr. [name] notes 06-23-16, improved EF 50% , may proceed as planned with clearance note on chart from this visit. However he desired any any a report of last device check notes-if any done after 07-04-13. 07-18-16 1155 Cardiac Failure clinic contacted by me to see if they have done or do device checks with patient clinic visits." Jaylene" -states they only check for fluid overload, but she will discuss  or relay message to Dr. Rayann Heman / Dr. [name] concerning no device checks since 07-04-13, however pt very compliant with her clinic visits. I feel pt was thinking she was doing all she needed to do at this point.

## 2016-07-21 ENCOUNTER — Encounter (HOSPITAL_COMMUNITY)
Admission: RE | Disposition: A | Discharge status: Home / Self Care | Payer: Self-pay | Source: Ambulatory Visit | Attending: Urology

## 2016-07-21 ENCOUNTER — Ambulatory Visit (HOSPITAL_COMMUNITY): Payer: Medicare Other | Admitting: Certified Registered Nurse Anesthetist

## 2016-07-21 ENCOUNTER — Ambulatory Visit (HOSPITAL_COMMUNITY)
Admission: RE | Admit: 2016-07-21 | Discharge: 2016-07-21 | Disposition: A | Discharge status: Home / Self Care | Payer: Medicare Other | Source: Ambulatory Visit | Attending: Urology | Admitting: Urology

## 2016-07-21 ENCOUNTER — Encounter (HOSPITAL_COMMUNITY): Payer: Self-pay | Admitting: *Deleted

## 2016-07-21 DIAGNOSIS — E669 Obesity, unspecified: Secondary | ICD-10-CM | POA: Diagnosis not present

## 2016-07-21 DIAGNOSIS — N201 Calculus of ureter: Secondary | ICD-10-CM | POA: Insufficient documentation

## 2016-07-21 DIAGNOSIS — Z7901 Long term (current) use of anticoagulants: Secondary | ICD-10-CM | POA: Diagnosis not present

## 2016-07-21 DIAGNOSIS — I428 Other cardiomyopathies: Secondary | ICD-10-CM | POA: Diagnosis not present

## 2016-07-21 DIAGNOSIS — Z95810 Presence of automatic (implantable) cardiac defibrillator: Secondary | ICD-10-CM | POA: Diagnosis not present

## 2016-07-21 DIAGNOSIS — Z79899 Other long term (current) drug therapy: Secondary | ICD-10-CM | POA: Insufficient documentation

## 2016-07-21 DIAGNOSIS — I110 Hypertensive heart disease with heart failure: Secondary | ICD-10-CM | POA: Diagnosis not present

## 2016-07-21 DIAGNOSIS — Z6832 Body mass index (BMI) 32.0-32.9, adult: Secondary | ICD-10-CM | POA: Insufficient documentation

## 2016-07-21 DIAGNOSIS — I129 Hypertensive chronic kidney disease with stage 1 through stage 4 chronic kidney disease, or unspecified chronic kidney disease: Secondary | ICD-10-CM | POA: Diagnosis not present

## 2016-07-21 DIAGNOSIS — I5022 Chronic systolic (congestive) heart failure: Secondary | ICD-10-CM | POA: Diagnosis not present

## 2016-07-21 HISTORY — PX: CYSTOSCOPY/URETEROSCOPY/HOLMIUM LASER/STENT PLACEMENT: SHX6546

## 2016-07-21 HISTORY — PX: CYSTOSCOPY W/ URETERAL STENT REMOVAL: SHX1430

## 2016-07-21 LAB — PROTIME-INR
INR: 1.12
PROTHROMBIN TIME: 14.4 s (ref 11.4–15.2)

## 2016-07-21 SURGERY — CYSTOSCOPY/URETEROSCOPY/HOLMIUM LASER/STENT PLACEMENT
Anesthesia: General | Site: Ureter | Laterality: Right

## 2016-07-21 MED ORDER — ONDANSETRON HCL 4 MG/2ML IJ SOLN
INTRAMUSCULAR | Status: DC | PRN
  Administered 2016-07-21: 4 mg via INTRAVENOUS

## 2016-07-21 MED ORDER — TRAMADOL HCL 50 MG PO TABS
50.0000 mg | ORAL_TABLET | Freq: Once | ORAL | Status: AC
  Administered 2016-07-21: 50 mg via ORAL
  Filled 2016-07-21: qty 1

## 2016-07-21 MED ORDER — ONDANSETRON HCL 4 MG/2ML IJ SOLN
INTRAMUSCULAR | Status: AC
  Filled 2016-07-21: qty 2

## 2016-07-21 MED ORDER — LIDOCAINE 2% (20 MG/ML) 5 ML SYRINGE
INTRAMUSCULAR | Status: AC
  Filled 2016-07-21: qty 5

## 2016-07-21 MED ORDER — LACTATED RINGERS IV SOLN: INTRAVENOUS | Status: DC

## 2016-07-21 MED ORDER — FENTANYL CITRATE (PF) 100 MCG/2ML IJ SOLN
INTRAMUSCULAR | Status: DC | PRN
  Administered 2016-07-21 (×2): 25 ug via INTRAVENOUS
  Administered 2016-07-21 (×2): 12.5 ug via INTRAVENOUS
  Administered 2016-07-21: 25 ug via INTRAVENOUS

## 2016-07-21 MED ORDER — EPHEDRINE 5 MG/ML INJ
INTRAVENOUS | Status: AC
  Filled 2016-07-21: qty 10

## 2016-07-21 MED ORDER — DEXAMETHASONE SODIUM PHOSPHATE 4 MG/ML IJ SOLN
INTRAMUSCULAR | Status: DC | PRN
  Administered 2016-07-21: 10 mg via INTRAVENOUS

## 2016-07-21 MED ORDER — PROPOFOL 10 MG/ML IV BOLUS
INTRAVENOUS | Status: DC | PRN
  Administered 2016-07-21: 20 mg via INTRAVENOUS
  Administered 2016-07-21: 180 mg via INTRAVENOUS

## 2016-07-21 MED ORDER — PROMETHAZINE HCL 25 MG/ML IJ SOLN: 6.2500 mg | INTRAMUSCULAR | Status: DC | PRN

## 2016-07-21 MED ORDER — LIDOCAINE HCL (CARDIAC) 20 MG/ML IV SOLN
INTRAVENOUS | Status: DC | PRN
  Administered 2016-07-21: 100 mg via INTRAVENOUS

## 2016-07-21 MED ORDER — LACTATED RINGERS IV SOLN
INTRAVENOUS | Status: DC | PRN
  Administered 2016-07-21: 07:00:00 via INTRAVENOUS

## 2016-07-21 MED ORDER — TRAMADOL HCL 50 MG PO TABS: 50.0000 mg | ORAL_TABLET | Freq: Four times a day (QID) | ORAL | 0 refills | Status: DC | PRN

## 2016-07-21 MED ORDER — MIDAZOLAM HCL 5 MG/5ML IJ SOLN
INTRAMUSCULAR | Status: DC | PRN
  Administered 2016-07-21 (×2): 1 mg via INTRAVENOUS

## 2016-07-21 MED ORDER — CEFAZOLIN SODIUM-DEXTROSE 2-4 GM/100ML-% IV SOLN
2.0000 g | INTRAVENOUS | Status: AC
  Administered 2016-07-21: 2 g via INTRAVENOUS

## 2016-07-21 MED ORDER — SULFAMETHOXAZOLE-TRIMETHOPRIM 800-160 MG PO TABS: 1.0000 | ORAL_TABLET | Freq: Two times a day (BID) | ORAL | 0 refills | Status: DC

## 2016-07-21 MED ORDER — FENTANYL CITRATE (PF) 100 MCG/2ML IJ SOLN
INTRAMUSCULAR | Status: AC
  Filled 2016-07-21: qty 2

## 2016-07-21 MED ORDER — EPHEDRINE SULFATE 50 MG/ML IJ SOLN
INTRAMUSCULAR | Status: DC | PRN
  Administered 2016-07-21 (×4): 5 mg via INTRAVENOUS

## 2016-07-21 MED ORDER — HYDROMORPHONE HCL 1 MG/ML IJ SOLN: 0.2500 mg | INTRAMUSCULAR | Status: DC | PRN

## 2016-07-21 MED ORDER — DEXAMETHASONE SODIUM PHOSPHATE 10 MG/ML IJ SOLN
INTRAMUSCULAR | Status: AC
  Filled 2016-07-21: qty 1

## 2016-07-21 MED ORDER — CEFAZOLIN SODIUM-DEXTROSE 2-4 GM/100ML-% IV SOLN
INTRAVENOUS | Status: AC
  Filled 2016-07-21: qty 100

## 2016-07-21 MED ORDER — MIDAZOLAM HCL 2 MG/2ML IJ SOLN
INTRAMUSCULAR | Status: AC
  Filled 2016-07-21: qty 2

## 2016-07-21 MED ORDER — SODIUM CHLORIDE 0.9 % IR SOLN
Status: DC | PRN
  Administered 2016-07-21: 4000 mL

## 2016-07-21 MED ORDER — IOHEXOL 300 MG/ML  SOLN
INTRAMUSCULAR | Status: DC | PRN
  Administered 2016-07-21: 8 mL

## 2016-07-21 MED ORDER — PROPOFOL 10 MG/ML IV BOLUS
INTRAVENOUS | Status: AC
  Filled 2016-07-21: qty 20

## 2016-07-21 SURGICAL SUPPLY — 16 items
BAG URO CATCHER STRL LF (MISCELLANEOUS) ×3 IMPLANT
BASKET DAKOTA 1.9FR 11X120 (BASKET) ×6 IMPLANT
CATH INTERMIT  6FR 70CM (CATHETERS) ×3 IMPLANT
CLOTH BEACON ORANGE TIMEOUT ST (SAFETY) ×3 IMPLANT
FIBER LASER FLEXIVA 365 (UROLOGICAL SUPPLIES) ×3 IMPLANT
GLOVE BIOGEL M 8.0 STRL (GLOVE) ×3 IMPLANT
GOWN STRL REUS W/ TWL XL LVL3 (GOWN DISPOSABLE) ×1 IMPLANT
GOWN STRL REUS W/TWL LRG LVL3 (GOWN DISPOSABLE) ×3 IMPLANT
GOWN STRL REUS W/TWL XL LVL3 (GOWN DISPOSABLE) ×3
GUIDEWIRE ANG ZIPWIRE 038X150 (WIRE) IMPLANT
GUIDEWIRE STR DUAL SENSOR (WIRE) ×3 IMPLANT
MANIFOLD NEPTUNE II (INSTRUMENTS) ×3 IMPLANT
PACK CYSTO (CUSTOM PROCEDURE TRAY) ×3 IMPLANT
SYRINGE IRR TOOMEY STRL 70CC (SYRINGE) ×3 IMPLANT
TUBING CONNECTING 10 (TUBING) ×2 IMPLANT
TUBING CONNECTING 10' (TUBING) ×1

## 2016-07-21 NOTE — Anesthesia Postprocedure Evaluation (Signed)
Anesthesia Post Note  Patient: Anita Warren  Procedure(s) Performed: Procedure(s) (LRB): CYSTOSCOPY/URETEROSCOPY/HOLMIUM LASER/   right retrograde pylegram (Right) CYSTOSCOPY WITH STENT REMOVAL (Right)  Patient location during evaluation: PACU Anesthesia Type: General Level of consciousness: awake and alert Pain management: pain level controlled Vital Signs Assessment: post-procedure vital signs reviewed and stable Respiratory status: spontaneous breathing, nonlabored ventilation, respiratory function stable and patient connected to nasal cannula oxygen Cardiovascular status: blood pressure returned to baseline and stable Postop Assessment: no signs of nausea or vomiting Anesthetic complications: no    Last Vitals:  Vitals:   07/21/16 0914 07/21/16 1015  BP: 120/72 129/79  Pulse: (!) 53 (!) 55  Resp: 18 18  Temp: 36.4 C 36.6 C    Last Pain:  Vitals:   07/21/16 1015  TempSrc:   PainSc: [place] Chris Narasimhan

## 2016-07-21 NOTE — Addendum Note (Signed)
Addendum  created 07/21/16 1033 by Maxwell Caul, CRNA   Anesthesia Intra Flowsheets edited

## 2016-07-21 NOTE — Discharge Instructions (Signed)
POSTOPERATIVE CARE AFTER URETEROSCOPY  Stent management  *Stents are often left in after ureteroscopy and stone treatment. If left in, they often cause urinary frequency, urgency, occasional blood in the urine, as well as flank discomfort with urination. These are all expected issues, and should resolve after the stent is removed. *Often times, a small thread is left on the end of the stent, and brought out through the urethra. If so, this is used to remove the stent, making it unnecessary to look in the bladder with a scope in the office to remove the stent. If a thread is left on, did not pull on it until instructed.  Diet  Once you have adequately recovered from anesthesia, you may gradually advance your diet, as tolerated, to your regular diet.  Activities  You may gradually increase your activities to your normal unrestricted level the day following your procedure.  Medications  You should resume all preoperative medications. If you are on aspirin-like compounds, you should not resume these until the blood clears from your urine. If given an antibiotic by the surgeon, take these until they are completed. You may also be given, if you have a stent, medications to decrease the urinary frequency and urgency.  Pain  After ureteroscopy, there may be some pain on the side of the scope. Take your pain medicine for this. Usually, this pain resolves within a day or 2.  Fever  Please report any fever over 100 to the doctor.    General Anesthesia, Adult, Care After Refer to this sheet in the next few weeks. These instructions provide you with information on caring for yourself after your procedure. Your health care provider may also give you more specific instructions. Your treatment has been planned according to current medical practices, but problems sometimes occur. Call your health care provider if you have any problems or questions after your procedure. WHAT TO EXPECT AFTER THE  PROCEDURE After the procedure, it is typical to experience:  Sleepiness.  Nausea and vomiting. HOME CARE INSTRUCTIONS  For the first 24 hours after general anesthesia:  Have a responsible person with you.  Do not drive a car. If you are alone, do not take public transportation.  Do not drink alcohol.  Do not take medicine that has not been prescribed by your health care provider.  Do not sign important papers or make important decisions.  You may resume a normal diet and activities as directed by your health care provider.  If you have questions or problems that seem related to general anesthesia, call the hospital and ask for the anesthetist or anesthesiologist on call. SEEK MEDICAL CARE IF:  You have nausea and vomiting that continue the day after anesthesia.  You develop a rash. SEEK IMMEDIATE MEDICAL CARE IF:   You have difficulty breathing.  You have chest pain.  You have any allergic problems.   This information is not intended to replace advice given to you by your health care provider. Make sure you discuss any questions you have with your health care provider.   Document Released: 01/19/2001 Document Revised: 11/03/2014 Document Reviewed: 02/11/2012 Elsevier Interactive Patient Education [employer].

## 2016-07-21 NOTE — Transfer of Care (Signed)
Immediate Anesthesia Transfer of Care Note  Patient: MAKAYLYNN HEMRIC  Procedure(s) Performed: Procedure(s): CYSTOSCOPY/URETEROSCOPY/HOLMIUM LASER/   right retrograde pylegram (Right) CYSTOSCOPY WITH STENT REMOVAL (Right)  Patient Location: PACU  Anesthesia Type:General  Level of Consciousness:  sedated, patient cooperative and responds to stimulation  Airway & Oxygen Therapy:Patient Spontanous Breathing and Patient connected to face mask oxgen  Post-op Assessment:  Report given to PACU RN and Post -op Vital signs reviewed and stable  Post vital signs:  Reviewed and stable  Last Vitals:  Vitals:   07/21/16 0547  BP: 126/77  Pulse: 64  Resp: 16  Temp: 123XX123 C    Complications: No apparent anesthesia complications

## 2016-07-21 NOTE — Op Note (Signed)
Preoperative diagnosis: Large right distal ureteral stone  Postoperative diagnosis: Same  Principal procedure: Cystoscopy, right double-J stent extraction, retrograde ureteropyelogram with fluoroscopic interpretation, holmium laser lithotripsy and extraction of large right distal ureteral stone  Surgeon: Muna Demers  Anesthesia: Gen. with LMA  Drains: None  Specimen: Stone fragments, to the patient's husband  Estimated blood loss: None  Complications: None  Indications: 55 year old female with a large right distal ureteral stone.  She presented in late June of this year with an infection in an obstructing right ureteral stone.  Urgent stent placement, antibiotic administration and hospitalization were then performed.  She no longer has urinary tract infection, and has tolerated the stent well.  She has had cardiac clearance for her anesthetic procedure, and presents at this time for ureteroscopic management of his large right distal ureteral stone.  The procedure as well as risks and complications of the procedure were discussed with the patient.  She understands these and desires to proceed.    Description of procedure.  The patient was identified and marked in the holding area.  She received preoperative IV antibiotic choose taken the operating room where general anesthetic was administered using the LMA.  She was placed in the dorsolithotomy position.  Genitalia and perineum were prepped and draped.  Proper timeout was performed.  A 23 French cystoscope was advanced into the bladder.  The bladder was inspected circumferentially.  There were no tumors trabeculations or foreign bodies.  Ureteral orifices were normal in configuration and location.  Right ureteral orifice was identified with a double-J stent protruding.  The stent was partially extracted through the meatus, and the guidewire advanced through the double-J stent into the right upper pole calyceal system.  The stent was extracted  over the guidewire.  The cystoscope was then removed.  A 6 French dual-lumen semirigid ureteroscope was advanced to the urethra and into the right ureter where the stone was encountered.  It was too large to be extracted without fragmentation.  The 360 micron laser fiber was then passed through the ureteroscope up to the stones were holmium laser energy was used to fragment the stone into multiple small fragments that were then easily extracted with the [place] basket.  Following extraction of all the fragments into the bladder, inspection was carried out all the way to the UPJ with the ureteroscope, no further stones were seen.  At this point the ureteroscope was removed.  The stone fragments were irrigated from the bladder with the cystoscope and a [place] syringe and saved for specimen.  The cystoscope was then used to perform a retrograde ureteropyelogram.  This was performed using 6 [place] open-ended catheter.  Contrast was injected into the right distal ureter.  This revealed slight dilation of the ureter all the way to the slightly dilated calyceal system.  No filling defects were noted.  There were no ureteral strictures.  With the fluoroscopy, running, there was easy drainage of the right ureter and into the bladder, therefore, I felt no stent needed to be placed.  This point, the bladder was drained.  The scope was removed.  Stones were saved for specimen.  The patient was then awakened and taken to the PACU in stable condition.  She tolerated the procedure well.

## 2016-07-21 NOTE — Anesthesia Preprocedure Evaluation (Addendum)
Anesthesia Evaluation  Patient identified by MRN, date of birth, ID band Patient awake    Reviewed: Allergy & Precautions, NPO status , Patient's Chart, lab work & pertinent test results  Airway Mallampati: I  TM Distance: >3 FB Neck ROM: Full    Dental  (+) Teeth Intact, Dental Advisory Given, Chipped,    Pulmonary neg pulmonary ROS,    breath sounds clear to auscultation       Cardiovascular hypertension, Pt. on home beta blockers and Pt. on medications +CHF  + Cardiac Defibrillator  Rhythm:Regular Rate:Normal     Neuro/Psych negative neurological ROS  negative psych ROS   GI/Hepatic negative GI ROS, Neg liver ROS,   Endo/Other  negative endocrine ROS  Renal/GU Renal disease  negative genitourinary   Musculoskeletal negative musculoskeletal ROS (+)   Abdominal   Peds negative pediatric ROS (+)  Hematology negative hematology ROS (+)   Anesthesia Other Findings   Reproductive/Obstetrics negative OB ROS                           Lab Results  Component Value Date   WBC 7.6 07/16/2016   HGB 9.9 (L) 07/16/2016   HCT 30.8 (L) 07/16/2016   MCV 89.8 07/16/2016   PLT 195 07/16/2016   Lab Results  Component Value Date   CREATININE 1.67 (H) 07/16/2016   BUN 26 (H) 07/16/2016   NA 140 07/16/2016   K 3.5 07/16/2016   CL 106 07/16/2016   CO2 27 07/16/2016   Lab Results  Component Value Date   INR 1.12 07/21/2016   INR 2.95 (H) 04/28/2016   INR 1.16 06/07/2014   Echo - Left ventricle: The cavity size was mildly dilated. Wall   thickness was normal. The estimated ejection fraction was 50%.   Diffuse hypokinesis. Doppler parameters are consistent with   abnormal left ventricular relaxation (grade 1 diastolic   dysfunction). - Aortic valve: There was no stenosis. - Mitral valve: There was trivial regurgitation. - Left atrium: The atrium was mildly dilated. - Right ventricle: The  cavity size was normal. Pacer wire or   catheter noted in right ventricle. Systolic function was normal. - Pulmonary arteries: No complete TR doppler jet so unable to   estimate PA systolic pressure. - Pericardium, extracardiac: A trivial pericardial effusion was   identified.   Anesthesia Physical Anesthesia Plan  ASA: III  Anesthesia Plan: General   Post-op Pain Management:    Induction: Intravenous  Airway Management Planned: LMA  Additional Equipment:   Intra-op Plan:   Post-operative Plan: Extubation in OR  Informed Consent: I have reviewed the patients History and Physical, chart, labs and discussed the procedure including the risks, benefits and alternatives for the proposed anesthesia with the patient or authorized representative who has indicated his/her understanding and acceptance.   Dental advisory given  Plan Discussed with: CRNA  Anesthesia Plan Comments:         Anesthesia Quick Evaluation

## 2016-07-21 NOTE — Progress Notes (Signed)
Has been up to void x 2. Denies dizziness. Steady gait

## 2016-07-21 NOTE — Anesthesia Procedure Notes (Signed)
Procedure Name: LMA Insertion Date/Time: 07/21/2016 7:42 AM Performed by: Maxwell Caul Pre-anesthesia Checklist: Patient identified, Emergency Drugs available, Suction available and Patient being monitored Patient Re-evaluated:Patient Re-evaluated prior to inductionOxygen Delivery Method: Circle system utilized Preoxygenation: Pre-oxygenation with 100% oxygen Intubation Type: IV induction LMA: LMA inserted LMA Size: 4.0 Number of attempts: 1 Placement Confirmation: positive ETCO2 and breath sounds checked- equal and bilateral Tube secured with: Tape Dental Injury: Teeth and Oropharynx as per pre-operative assessment

## 2016-07-21 NOTE — H&P (Signed)
H&P  Chief Complaint: Kidney stone  History of Present Illness: Anita Warren is a 55 y.o. year old female presenting for ureteroscopic mgmt of a large right distal ureteral stone. This was previously stented emergently this past summer when she presented with an infection and this large obstructing stone. She has been cleared by Dr Sung Amabile who manages her heart failure to have this procedure done.  Past Medical History:  Diagnosis Date  . AICD (automatic cardioverter/defibrillator) present    St. Jude AICD implanted 03-07-2011 Dr. Allred/-Dr. [name] now follows  . CHF (congestive heart failure) ([place])    meds controlling, no episodes since 2014  . Chronic systolic heart failure ([place])   . Essential hypertension, benign   . History of kidney stones    multiple kidney stones in past  . History of medication noncompliance   . Hydronephrosis with renal and ureteral calculus obstruction 09/05/2013  . Nonischemic cardiomyopathy (H[facility])    LVEF 5-10%, likely viral (no CAD by cath 01/30/11)  . NSVT (nonsustained ventricular tachycardia) ([place])   . Obesity     Past Surgical History:  Procedure Laterality Date  . ABDOMINAL HYSTERECTOMY    . BREAST LUMPECTOMY  1989   L breast- benign  . CARDIAC DEFIBRILLATOR PLACEMENT  5/12   SJM by JA  . CESAREAN SECTION     x 2  . CHOLECYSTECTOMY    . COLONOSCOPY  07/02/2012   Procedure: COLONOSCOPY;  Surgeon: [name], MD;  Location: [facility] SUITE;  Service: Endoscopy;  Laterality: N/A;  1:15/PATIENT HAS A DEFIBRILLATOR  . CYSTOSCOPY W/ URETERAL STENT PLACEMENT Right 09/06/2013   Procedure: CYSTOSCOPY WITH RIGHT RETROGRADE PYELOGRAM; RIGHT URETERAL STENT PLACEMENT;  Surgeon: [name], MD;  Location: [facility] ORS;  Service: Urology;  Laterality: Right;  . CYSTOSCOPY W/ URETERAL STENT PLACEMENT N/A 04/28/2016   Procedure: CYSTOSCOPY WITH  RIGHT RETROGRADE PYELOGRAM/RIGHT URETERAL STENT PLACEMENT;  Surgeon: Rana Snare, MD;  Location: [facility] ORS;   Service: Urology;  Laterality: N/A;  . TONSILLECTOMY    . TUBAL LIGATION      Home Medications:  Medications Prior to Admission  Medication Sig Dispense Refill  . acetaminophen (TYLENOL) 500 MG tablet Take 1,000 mg by mouth every 6 (six) hours as needed for moderate pain.    Marland Kitchen amiodarone (PACERONE) 200 MG tablet TAKE 1 TABLET BY MOUTH ONCE DAILY 34 tablet PRN  . carvedilol (COREG) 12.5 MG tablet TAKE 1 & 1/2 TABLETS BY MOUTH 2 TIMES DAILY WITH A MEAL. 90 tablet PRN  . Coenzyme Q10 (CO Q 10) 100 MG CAPS Take 100 mg by mouth daily.     . furosemide (LASIX) 40 MG tablet Take 1 tablet (40 mg total) by mouth 2 (two) times a week. Every Mon and Fri 90 tablet 3  . hydrALAZINE (APRESOLINE) 100 MG tablet TAKE 1 TABLET BY MOUTH 3 TIMES DAILY. 90 tablet PRN  . isosorbide mononitrate (IMDUR) 30 MG 24 hr tablet TAKE 3 TABLETS BY MOUTH ONCE DAILY 90 tablet PRN  . Magnesium Oxide 400 (240 MG) MG TABS Take 1 tablet by mouth daily. 90 tablet 3  . potassium chloride SA (K-DUR,KLOR-CON) 20 MEQ tablet Take 2 tablets (40 mEq total) by mouth 2 (two) times a week. Mondays and fridays with lasix 8 tablet 0  . spironolactone (ALDACTONE) 25 MG tablet TAKE 1 TABLET BY MOUTH DAILY. 90 tablet 3  . apixaban (ELIQUIS) 5 MG TABS tablet Take 1 tablet (5 mg total) by mouth 2 (two) times daily.  180 tablet 3  . metoCLOPramide (REGLAN) 10 MG tablet Take 1 tablet (10 mg total) by mouth every 6 (six) hours as needed for nausea or vomiting. (Patient not taking: Reported on 07/11/2016) 30 tablet 0    Allergies:  Allergies  Allergen Reactions  . Peanut-Containing Drug Products Anaphylaxis  . Ace Inhibitors Swelling  . Morphine Nausea And Vomiting  . Demerol Rash    Family History  Problem Relation Age of Onset  . Hypertension    . Diabetes    . Colon cancer Neg Hx     Social History:  reports that she has never smoked. She has never used smokeless tobacco. She reports that she does not drink alcohol or use  drugs.  ROS: A complete review of systems was performed.  All systems are negative except for pertinent findings as noted.  Physical Exam:  Vital signs in last 24 hours:   General:  Alert and oriented, No acute distress HEENT: Normocephalic, atraumatic Neck: No JVD or lymphadenopathy Cardiovascular: Regular rate and rhythm Lungs: Clear bilaterally Abdomen: Soft, nontender, nondistended, no abdominal masses Back: No CVA tenderness Extremities: No edema Neurologic: Grossly intact  Laboratory Data:  No results found for this or any previous visit (from the past 24 hour(s)). No results found for this or any previous visit (from the past 240 hour(s)). Creatinine:  Recent Labs  07/16/16 0900  CREATININE 1.67*    Radiologic Imaging: No results found.  Impression/Assessment:  Large right distal ureteral stone  Plan:  Cysto, right J2 stent extraction, right URS, HLL, extraction of right stone, J2 stent  [name] 07/21/2016, 5:45 AM  [name]. Cliffie Gingras MD

## 2016-08-13 ENCOUNTER — Other Ambulatory Visit (HOSPITAL_COMMUNITY): Payer: Self-pay | Admitting: Internal Medicine

## 2016-09-02 ENCOUNTER — Ambulatory Visit (INDEPENDENT_AMBULATORY_CARE_PROVIDER_SITE_OTHER): Payer: Medicare Other | Admitting: Urology

## 2016-09-02 DIAGNOSIS — N201 Calculus of ureter: Secondary | ICD-10-CM

## 2016-09-29 DIAGNOSIS — N200 Calculus of kidney: Secondary | ICD-10-CM | POA: Diagnosis not present

## 2016-10-08 DIAGNOSIS — I5032 Chronic diastolic (congestive) heart failure: Secondary | ICD-10-CM | POA: Diagnosis not present

## 2016-10-08 DIAGNOSIS — I429 Cardiomyopathy, unspecified: Secondary | ICD-10-CM | POA: Diagnosis not present

## 2016-10-08 DIAGNOSIS — I5022 Chronic systolic (congestive) heart failure: Secondary | ICD-10-CM | POA: Diagnosis not present

## 2016-10-08 DIAGNOSIS — Z6833 Body mass index (BMI) 33.0-33.9, adult: Secondary | ICD-10-CM | POA: Diagnosis not present

## 2016-10-08 DIAGNOSIS — Z0001 Encounter for general adult medical examination with abnormal findings: Secondary | ICD-10-CM | POA: Diagnosis not present

## 2016-10-08 DIAGNOSIS — I420 Dilated cardiomyopathy: Secondary | ICD-10-CM | POA: Diagnosis not present

## 2016-10-08 DIAGNOSIS — Z79899 Other long term (current) drug therapy: Secondary | ICD-10-CM | POA: Diagnosis not present

## 2016-10-08 DIAGNOSIS — I4892 Unspecified atrial flutter: Secondary | ICD-10-CM | POA: Diagnosis not present

## 2016-10-08 DIAGNOSIS — E6609 Other obesity due to excess calories: Secondary | ICD-10-CM | POA: Diagnosis not present

## 2016-10-08 DIAGNOSIS — I10 Essential (primary) hypertension: Secondary | ICD-10-CM | POA: Diagnosis not present

## 2016-10-08 DIAGNOSIS — Z23 Encounter for immunization: Secondary | ICD-10-CM | POA: Diagnosis not present

## 2016-11-14 ENCOUNTER — Other Ambulatory Visit (HOSPITAL_COMMUNITY): Payer: Self-pay | Admitting: Internal Medicine

## 2017-01-15 ENCOUNTER — Other Ambulatory Visit (HOSPITAL_COMMUNITY): Payer: Self-pay | Admitting: Internal Medicine

## 2017-01-16 ENCOUNTER — Other Ambulatory Visit (HOSPITAL_COMMUNITY): Payer: Self-pay | Admitting: Cardiology

## 2017-01-16 DIAGNOSIS — I5023 Acute on chronic systolic (congestive) heart failure: Secondary | ICD-10-CM

## 2017-01-16 MED ORDER — FUROSEMIDE 40 MG PO TABS: 40.0000 mg | ORAL_TABLET | ORAL | 3 refills | Status: DC

## 2017-01-22 ENCOUNTER — Telehealth: Payer: Self-pay | Admitting: Cardiology

## 2017-01-22 ENCOUNTER — Telehealth (HOSPITAL_COMMUNITY): Payer: Self-pay | Admitting: *Deleted

## 2017-01-22 NOTE — Telephone Encounter (Signed)
Pt out of eliquis and needs to reapply for pt assistance.  Left samples at front desk and form for her to sign for [facility].

## 2017-01-22 NOTE — Telephone Encounter (Signed)
Pt is going to bring her monitor on April 25 b/c her monitor has not updated since 2013.

## 2017-01-26 ENCOUNTER — Other Ambulatory Visit (HOSPITAL_COMMUNITY): Payer: Self-pay | Admitting: Pharmacist

## 2017-01-26 ENCOUNTER — Telehealth (HOSPITAL_COMMUNITY): Payer: Self-pay | Admitting: Pharmacist

## 2017-01-26 MED ORDER — APIXABAN 5 MG PO TABS: 5.0000 mg | ORAL_TABLET | Freq: Two times a day (BID) | ORAL | 11 refills | Status: DC

## 2017-01-26 NOTE — Telephone Encounter (Signed)
[name] does not currently qualify for [employer] patient assistance for Eliquis. She has to have spent 3% of her annual income on her Rx's in 2018 ($396). I have relayed this information to her and she will try to get a copy of her pharmacy print out.   Ruta Hinds. Velva Harman, PharmD, BCPS, CPP Clinical Pharmacist Pager: [phone] Phone: [phone] 01/26/2017 3:00 PM

## 2017-01-29 ENCOUNTER — Telehealth: Payer: Self-pay | Admitting: Cardiology

## 2017-01-29 NOTE — Telephone Encounter (Signed)
Spoke w/ pt and requested that she send a manual transmission b/c her home monitor has not updated in at least 7 days.   

## 2017-02-02 ENCOUNTER — Encounter: Payer: Self-pay | Admitting: Internal Medicine

## 2017-02-10 ENCOUNTER — Other Ambulatory Visit (HOSPITAL_COMMUNITY): Payer: Self-pay | Admitting: *Deleted

## 2017-02-10 ENCOUNTER — Other Ambulatory Visit (HOSPITAL_COMMUNITY): Payer: Self-pay | Admitting: Internal Medicine

## 2017-02-10 DIAGNOSIS — Z1239 Encounter for other screening for malignant neoplasm of breast: Secondary | ICD-10-CM

## 2017-02-10 MED ORDER — CARVEDILOL 12.5 MG PO TABS: ORAL_TABLET | ORAL | 6 refills | Status: DC

## 2017-02-10 MED ORDER — ISOSORBIDE MONONITRATE ER 30 MG PO TB24: 90.0000 mg | ORAL_TABLET | Freq: Every day | ORAL | 6 refills | Status: DC

## 2017-02-12 ENCOUNTER — Other Ambulatory Visit (HOSPITAL_COMMUNITY): Payer: Self-pay | Admitting: Cardiology

## 2017-02-13 ENCOUNTER — Ambulatory Visit (HOSPITAL_COMMUNITY)
Admission: RE | Admit: 2017-02-13 | Discharge: 2017-02-13 | Disposition: A | Discharge status: Home / Self Care | Payer: Medicare Other | Source: Ambulatory Visit | Attending: Internal Medicine | Admitting: Internal Medicine

## 2017-02-13 DIAGNOSIS — Z1231 Encounter for screening mammogram for malignant neoplasm of breast: Secondary | ICD-10-CM | POA: Diagnosis not present

## 2017-02-13 DIAGNOSIS — Z1239 Encounter for other screening for malignant neoplasm of breast: Secondary | ICD-10-CM

## 2017-02-17 ENCOUNTER — Other Ambulatory Visit (HOSPITAL_COMMUNITY): Payer: Self-pay | Admitting: Cardiology

## 2017-02-17 ENCOUNTER — Ambulatory Visit (HOSPITAL_COMMUNITY): Payer: Medicare Other

## 2017-02-17 MED ORDER — SPIRONOLACTONE 25 MG PO TABS
25.0000 mg | ORAL_TABLET | Freq: Every day | ORAL | 2 refills | Status: DC
Start: 2017-02-17 — End: 2017-02-18

## 2017-02-17 MED ORDER — HYDRALAZINE HCL 100 MG PO TABS: 100.0000 mg | ORAL_TABLET | Freq: Three times a day (TID) | ORAL | 2 refills | Status: DC

## 2017-02-17 MED ORDER — CARVEDILOL 12.5 MG PO TABS: ORAL_TABLET | ORAL | 2 refills | Status: DC

## 2017-02-18 ENCOUNTER — Ambulatory Visit (INDEPENDENT_AMBULATORY_CARE_PROVIDER_SITE_OTHER): Payer: Medicare Other | Admitting: Internal Medicine

## 2017-02-18 ENCOUNTER — Encounter: Payer: Self-pay | Admitting: Internal Medicine

## 2017-02-18 VITALS — BP 140/82 | HR 59 | Ht 64.0 in | Wt 203.2 lb

## 2017-02-18 DIAGNOSIS — I5022 Chronic systolic (congestive) heart failure: Secondary | ICD-10-CM

## 2017-02-18 DIAGNOSIS — I428 Other cardiomyopathies: Secondary | ICD-10-CM

## 2017-02-18 DIAGNOSIS — I480 Paroxysmal atrial fibrillation: Secondary | ICD-10-CM

## 2017-02-18 LAB — CUP PACEART INCLINIC DEVICE CHECK
Date Time Interrogation Session: 20180425124337
HighPow Impedance: 37.6226
Implantable Lead Implant Date: 20120511
Implantable Lead Location: 753860
Implantable Pulse Generator Implant Date: 20120511
Lead Channel Pacing Threshold Pulse Width: 0.5 ms
Lead Channel Pacing Threshold Pulse Width: 0.5 ms
MDC IDC MSMT LEADCHNL RV IMPEDANCE VALUE: 425 Ohm
MDC IDC MSMT LEADCHNL RV PACING THRESHOLD AMPLITUDE: 0.75 V
MDC IDC MSMT LEADCHNL RV PACING THRESHOLD AMPLITUDE: 0.75 V
MDC IDC MSMT LEADCHNL RV SENSING INTR AMPL: 11.7 mV
MDC IDC PG SERIAL: 634653
MDC IDC SET LEADCHNL RV PACING AMPLITUDE: 2.5 V
MDC IDC SET LEADCHNL RV PACING PULSEWIDTH: 0.5 ms
MDC IDC SET LEADCHNL RV SENSING SENSITIVITY: 0.5 mV
MDC IDC STAT BRADY RV PERCENT PACED: 0.12 %

## 2017-02-18 MED ORDER — AMIODARONE HCL 200 MG PO TABS: 100.0000 mg | ORAL_TABLET | Freq: Every day | ORAL | 3 refills | Status: DC

## 2017-02-18 MED ORDER — SPIRONOLACTONE 25 MG PO TABS: 25.0000 mg | ORAL_TABLET | Freq: Every day | ORAL | 2 refills | Status: DC

## 2017-02-18 NOTE — Patient Instructions (Addendum)
Medication Instructions:  Your physician has recommended you make the following change in your medication:  1) Decrease Amiodarone to 100 mg daily   Labwork: None ordered    Testing/Procedures: None ordered   Follow-Up: Remote monitoring is used to monitor your ICD from home. This monitoring reduces the number of office visits required to check your device to one time per year. It allows [place] to keep an eye on the functioning of your device to ensure it is working properly. You are scheduled for a device check from home on 05/20/17. You may send your transmission at any time that day. If you have a wireless device, the transmission will be sent automatically. After your physician reviews your transmission, you will receive a postcard with your next transmission date.  Your physician wants you to follow-up in: 12 months with Chanetta Marshall, NPYou will receive a reminder letter in the mail two months in advance. If you don't receive a letter, please call our office to schedule the follow-up appointment.   ICM clinic---Laurie Short, RN will call you to enroll in ICM clinic  Any Other Special Instructions Will Be Listed Below (If Applicable).     If you need a refill on your cardiac medications before your next appointment, please call your pharmacy.

## 2017-02-18 NOTE — Progress Notes (Signed)
PCP:  Glo Herring, MD Primary Cardiologist:  Dr [name]  The patient presents today for routine electrophysiology followup. I have not seen her since 2014.  She appears to have been quite ill in 2015, requiring milninone and being considered for VAD.  Fortunately, she has recovered.  She was placed on amiodarone then for atrial flutter.  She has been in sinus rhythm without symptoms of arrhythmia since.  She worries about the long term risks of amiodarone.   Today, she denies symptoms of fevers, chills, palpitations, chest pain, lower extremity edema, dizziness, presyncope, syncope, or neurologic sequela.  SOB is stable.  The patient feels that she is tolerating medications without difficulties and is otherwise without complaint today.   Past Medical History:  Diagnosis Date  . AICD (automatic cardioverter/defibrillator) present    St. Jude AICD implanted 03-07-2011 Dr. Konner Warrior/-Dr. [name] now follows  . CHF (congestive heart failure) ([place])    meds controlling, no episodes since 2014  . Chronic systolic heart failure ([place])   . Essential hypertension, benign   . History of kidney stones    multiple kidney stones in past  . History of medication noncompliance   . Hydronephrosis with renal and ureteral calculus obstruction 09/05/2013  . Nonischemic cardiomyopathy (HCC)    LVEF 5-10%, likely viral (no CAD by cath 01/30/11)  . NSVT (nonsustained ventricular tachycardia) ([place])   . Obesity    Past Surgical History:  Procedure Laterality Date  . ABDOMINAL HYSTERECTOMY    . BREAST LUMPECTOMY  1989   L breast- benign  . CARDIAC DEFIBRILLATOR PLACEMENT  5/12   SJM by JA  . CESAREAN SECTION     x 2  . CHOLECYSTECTOMY    . COLONOSCOPY  07/02/2012   Procedure: COLONOSCOPY;  Surgeon: [name], MD;  [facility];  Service: Endoscopy;  Laterality: N/A;  1:15/PATIENT HAS A DEFIBRILLATOR  . CYSTOSCOPY W/ URETERAL STENT PLACEMENT Right 09/06/2013   Procedure: CYSTOSCOPY WITH RIGHT  RETROGRADE PYELOGRAM; RIGHT URETERAL STENT PLACEMENT;  Surgeon: [name], MD;  Location: [facility]S;  Service: Urology;  Laterality: Right;  . CYSTOSCOPY W/ URETERAL STENT PLACEMENT N/A 04/28/2016   Procedure: CYSTOSCOPY WITH  RIGHT RETROGRADE PYELOGRAM/RIGHT URETERAL STENT PLACEMENT;  Surgeon: Rana Snare, MD;  Location: [facility];  Service: Urology;  Laterality: N/A;  . CYSTOSCOPY W/ URETERAL STENT REMOVAL Right 07/21/2016   Procedure: CYSTOSCOPY WITH STENT REMOVAL;  Surgeon: Franchot Gallo, MD;  Location: [facility];  Service: Urology;  Laterality: Right;  . CYSTOSCOPY/URETEROSCOPY/HOLMIUM LASER/STENT PLACEMENT Right 07/21/2016   Procedure: CYSTOSCOPY/URETEROSCOPY/HOLMIUM LASER/   right retrograde pylegram;  Surgeon: Franchot Gallo, MD;  Location: [facility];  Service: Urology;  Laterality: Right;  . TONSILLECTOMY    . TUBAL LIGATION      Current Outpatient Prescriptions  Medication Sig Dispense Refill  . acetaminophen (TYLENOL) 500 MG tablet Take 1,000 mg by mouth every 6 (six) hours as needed for moderate pain.    Marland Kitchen amiodarone (PACERONE) 200 MG tablet TAKE 1 TABLET BY MOUTH ONCE DAILY 34 tablet PRN  . apixaban (ELIQUIS) 5 MG TABS tablet Take 1 tablet (5 mg total) by mouth 2 (two) times daily. 60 tablet 11  . carvedilol (COREG) 12.5 MG tablet TAKE 1 & 1/2 TABLETS BY MOUTH 2 TIMES DAILY WITH A MEAL. 270 tablet 2  . Coenzyme Q10 (CO Q 10) 100 MG CAPS Take 100 mg by mouth daily.     . furosemide (LASIX) 40 MG tablet Take 1 tablet (40  mg total) by mouth 2 (two) times a week. Every Mon and Fri 15 tablet 3  . hydrALAZINE (APRESOLINE) 100 MG tablet Take 1 tablet (100 mg total) by mouth 3 (three) times daily. 270 tablet 2  . isosorbide mononitrate (IMDUR) 30 MG 24 hr tablet Take 3 tablets (90 mg total) by mouth daily. 90 tablet 6  . Magnesium Oxide 400 (240 MG) MG TABS Take 1 tablet by mouth daily. 90 tablet 3  . potassium chloride SA (K-DUR,KLOR-CON) 20 MEQ tablet Take 2 tablets (40 mEq total) by  mouth 2 (two) times a week. Mondays and fridays with lasix 8 tablet 0  . spironolactone (ALDACTONE) 25 MG tablet Take 1 tablet (25 mg total) by mouth daily. 90 tablet 2   No current facility-administered medications for this visit.     Allergies  Allergen Reactions  . Peanut-Containing Drug Products Anaphylaxis  . Ace Inhibitors Swelling  . Morphine Nausea And Vomiting  . Demerol Rash    Social History   Social History  . Marital status: Married    Spouse name: N/A  . Number of children: N/A  . Years of education: N/A   Occupational History  . 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 Smoking status: Never Smoker  . Smokeless tobacco: Never Used  . Alcohol use No  . Drug use: No  . Sexual activity: Yes   Other Topics Concern  . Not on file   Social History Narrative   Lives in [place] with spouse.  3 grown children.  Previously worked in [place] at [employer].    Family History  Problem Relation Age of Onset  . Hypertension    . Diabetes    . Colon cancer Neg Hx     ROS-  All systems are reviewed and are negative except as outlined in the HPI above  Physical Exam: Vitals:   02/18/17 1159  BP: 140/82  Pulse: (!) 59  SpO2: 98%  Weight: 203 lb 3.2 oz (92.2 kg)  Height: 5\' 4"  (1.626 m)    GEN- The patient is overweight appearing, alert and oriented x 3 today.   Head- normocephalic, atraumatic Eyes-  Sclera clear, conjunctiva pink Ears- hearing intact Oropharynx- clear Neck- supple, no JVP Lungs- CTAB, normal work of breathing Heart- Regular rate and rhythm, no murmurs, rubs or gallops, PMI not laterally displaced GI- soft, NT, ND, + BS Extremities- no clubbing, cyanosis, or edema MS- no significant deformity or atrophy Skin- no rash or lesion Psych- euthymic mood, full affect Neuro- strength and sensation are intact ICD pocket is well healed  ICD interrogation is  Personally  reviewed (See paceart) Epic records are reviewed as above  ekg tracing today is personally reviewed and   Assessment and Plan:  1. Nonischemic CM/ chronic systolic dysfunction euvolemic today No changes Will enroll in ICM device clinic Normal ICD function See Pace Art report No changes today  2. HTN Stable No change required today  3. Overweight Body mass index is 34.88 kg/m. Lifestyle modification encouraged  4. ? Atrial flutter Upon my review of ekgs, I do not readily see her atrial flutter documented She thinks that she has been in sinus for quite some time I think that given her young age and concerns about long term risks of amiodarone that we should try to wean this medicine. I will reduce to 100mg  daily today.  She could probably go to 100mg  QOD or QMWF as the next step If she has additional arrhythmias then ablation could be considered. On eliquis as per Dr [name] Will defer labs to her visit in May with the CHF team  [place] compliance encouraged Return to see EP NP in 1 year Follow-up with CHF team as scheduled  Thompson Grayer MD, [facility] 02/18/2017 12:46 PM

## 2017-02-20 ENCOUNTER — Telehealth: Payer: Self-pay

## 2017-02-20 NOTE — Telephone Encounter (Signed)
Patient returned call and provided ICM intro.  She agreed to [facility] monthly follow ups.  Assisted in sending transmission today to pair up with monitor and remote transmission received.  She stated Furosemide has recently been decreased from daily to twice a week and sometimes she has ankle swelling.  No changes today.  Encouraged to call for any fluid symptoms.

## 2017-02-20 NOTE — Telephone Encounter (Signed)
Referred to [facility] clinic by Dr Rayann Heman at last office visit 02/18/2017.   Attempted call to patient for ICM intro and left message fore return call.   ICM remote transmission scheduled for 03/09/2017 with intro.

## 2017-02-25 ENCOUNTER — Other Ambulatory Visit (HOSPITAL_COMMUNITY): Payer: Self-pay | Admitting: *Deleted

## 2017-02-25 DIAGNOSIS — I5023 Acute on chronic systolic (congestive) heart failure: Secondary | ICD-10-CM

## 2017-02-25 MED ORDER — FUROSEMIDE 40 MG PO TABS: 40.0000 mg | ORAL_TABLET | ORAL | 3 refills | Status: DC

## 2017-03-07 ENCOUNTER — Emergency Department (HOSPITAL_COMMUNITY): Payer: Medicare Other

## 2017-03-07 ENCOUNTER — Emergency Department (HOSPITAL_COMMUNITY)
Admission: EM | Admit: 2017-03-07 | Discharge: 2017-03-07 | Disposition: A | Discharge status: Home / Self Care | Payer: Medicare Other | Attending: Emergency Medicine | Admitting: Emergency Medicine

## 2017-03-07 ENCOUNTER — Encounter (HOSPITAL_COMMUNITY): Payer: Self-pay | Admitting: Emergency Medicine

## 2017-03-07 DIAGNOSIS — H538 Other visual disturbances: Secondary | ICD-10-CM | POA: Diagnosis not present

## 2017-03-07 DIAGNOSIS — R51 Headache: Secondary | ICD-10-CM | POA: Diagnosis not present

## 2017-03-07 DIAGNOSIS — I5022 Chronic systolic (congestive) heart failure: Secondary | ICD-10-CM | POA: Insufficient documentation

## 2017-03-07 DIAGNOSIS — Z79899 Other long term (current) drug therapy: Secondary | ICD-10-CM | POA: Diagnosis not present

## 2017-03-07 DIAGNOSIS — R519 Headache, unspecified: Secondary | ICD-10-CM

## 2017-03-07 DIAGNOSIS — Z95810 Presence of automatic (implantable) cardiac defibrillator: Secondary | ICD-10-CM | POA: Insufficient documentation

## 2017-03-07 DIAGNOSIS — N183 Chronic kidney disease, stage 3 (moderate): Secondary | ICD-10-CM | POA: Insufficient documentation

## 2017-03-07 DIAGNOSIS — I130 Hypertensive heart and chronic kidney disease with heart failure and stage 1 through stage 4 chronic kidney disease, or unspecified chronic kidney disease: Secondary | ICD-10-CM | POA: Diagnosis not present

## 2017-03-07 HISTORY — DX: Headache: R51

## 2017-03-07 HISTORY — DX: Headache, unspecified: R51.9

## 2017-03-07 MED ORDER — METOCLOPRAMIDE HCL 10 MG PO TABS: 10.0000 mg | ORAL_TABLET | Freq: Four times a day (QID) | ORAL | 0 refills | Status: DC | PRN

## 2017-03-07 MED ORDER — DIPHENHYDRAMINE HCL 50 MG/ML IJ SOLN
50.0000 mg | Freq: Once | INTRAMUSCULAR | Status: AC
  Administered 2017-03-07: 50 mg via INTRAMUSCULAR
  Filled 2017-03-07: qty 1

## 2017-03-07 MED ORDER — DEXAMETHASONE 4 MG PO TABS
8.0000 mg | ORAL_TABLET | Freq: Once | ORAL | Status: AC
  Administered 2017-03-07: 8 mg via ORAL
  Filled 2017-03-07: qty 2

## 2017-03-07 MED ORDER — METOCLOPRAMIDE HCL 5 MG/ML IJ SOLN
10.0000 mg | Freq: Once | INTRAMUSCULAR | Status: AC
  Administered 2017-03-07: 10 mg via INTRAMUSCULAR
  Filled 2017-03-07: qty 2

## 2017-03-07 NOTE — ED Provider Notes (Signed)
[facility] DEPT Provider Note   CSN: [id number] Arrival date & time: 03/07/17  1804     History   Chief Complaint Chief Complaint  Patient presents with  . Headache    HPI Anita Warren is a 56 y.o. female.  HPI Pt was seen at 1820. Per pt, c/o gradual onset and persistence of constant acute flair of her chronic headache for the past 3 days. Has been associated with "blurry vision."  Describes the headache as "throbbing," and per her usual chronic headache pattern for several years. States she has been extensively evaluated for her headaches by her PMD and Eye MD. Pt has not taken any medication to treat her headache. Denies headache was sudden or maximal in onset or at any time.  Denies eye pain, no focal motor weakness, no tingling/numbness in extremities, no fevers, no neck pain, no rash.     Past Medical History:  Diagnosis Date  . AICD (automatic cardioverter/defibrillator) present    St. Jude AICD implanted 03-07-2011 Dr. Allred/-Dr. [name] now follows  . CHF (congestive heart failure) ([place])    meds controlling, no episodes since 2014  . Chronic systolic heart failure ([place])   . Essential hypertension, benign   . Headache   . History of kidney stones    multiple kidney stones in past  . History of medication noncompliance   . Hydronephrosis with renal and ureteral calculus obstruction 09/05/2013  . Nonischemic cardiomyopathy (HCC)    LVEF 5-10%, likely viral (no CAD by cath 01/30/11)  . NSVT (nonsustained ventricular tachycardia) ([place])   . Obesity     Patient Active Problem List   Diagnosis Date Noted  . Ureteral calculus 04/28/2016  . Paroxysmal atrial fibrillation ([place]) 12/07/2014  . Hx of angioedema 10/25/2014  . CKD (chronic kidney disease) stage 3, GFR 30-59 ml/min 09/20/2014  . Chronic systolic heart failure ([place]) 06/14/2014  . Atrial flutter ([place]) 06/06/2014  . Palliative care encounter 06/06/2014  . CHF exacerbation ([place]) 05/23/2014  . Acute  on chronic renal failure ([place]) 05/23/2014  . Noncompliance with medication regimen 05/23/2014  . SOB (shortness of breath) 05/23/2014  . Hyperbilirubinemia 10/29/2013  . Acute systolic CHF (congestive heart failure) ([place]) 10/29/2013  . H/O viral gastroenteritis 10/28/2013  . Acute systolic CHF (congestive heart failure), NYHA class 3 ([place]) 10/28/2013  . Acute renal insufficiency 09/07/2013  . Pyelonephritis, acute 09/05/2013  . Hydronephrosis with renal and ureteral calculus obstruction 09/05/2013  . Acute on chronic systolic heart failure ([place]) 07/04/2013  . Encounter for screening colonoscopy 06/01/2012  . Obesity 06/27/2011  . HYPERTENSION 10/18/2010  . 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 ABDOMINAL HYSTERECTOMY    . BREAST LUMPECTOMY  1989   L breast- benign  . CARDIAC DEFIBRILLATOR PLACEMENT  5/12   SJM by JA  . CESAREAN SECTION     x 2  . CHOLECYSTECTOMY    . COLONOSCOPY  07/02/2012   Procedure: COLONOSCOPY;  Surgeon: [name], MD;  [facility];  Service: Endoscopy;  Laterality: N/A;  1:15/PATIENT HAS A DEFIBRILLATOR  . CYSTOSCOPY W/ URETERAL STENT PLACEMENT Right 09/06/2013   Procedure: CYSTOSCOPY WITH RIGHT RETROGRADE PYELOGRAM; RIGHT URETERAL STENT PLACEMENT;  Surgeon: [name], MD;  Location: [facility];  Service: Urology;  Laterality: Right;  . CYSTOSCOPY W/ URETERAL STENT PLACEMENT N/A 04/28/2016   Procedure: CYSTOSCOPY WITH  RIGHT RETROGRADE PYELOGRAM/RIGHT URETERAL STENT PLACEMENT;  Surgeon: Rana Snare, MD;  Location[facility];  Service: Urology;  Laterality: N/A;  . CYSTOSCOPY W/ URETERAL STENT REMOVAL Right 07/21/2016   Procedure: CYSTOSCOPY WITH STENT REMOVAL;  Surgeon: Franchot Gallo, MD;  Location[facility];  Service: Urology;  Laterality: Right;  . CYSTOSCOPY/URETEROSCOPY/HOLMIUM LASER/STENT PLACEMENT Right 07/21/2016   Procedure: CYSTOSCOPY/URETEROSCOPY/HOLMIUM LASER/   right  retrograde pylegram;  Surgeon: Franchot Gallo, MD;  Location[facility];  Service: Urology;  Laterality: Right;  . TONSILLECTOMY    . TUBAL LIGATION      OB History    Gravida Para Term Preterm AB Living             3   SAB TAB Ectopic Multiple Live Births                   Home Medications    Prior to Admission medications   Medication Sig Start Date End Date Taking? Authorizing Provider  acetaminophen (TYLENOL) 500 MG tablet Take 1,000 mg by mouth every 6 (six) hours as needed for moderate pain.    [provider]  amiodarone (PACERONE) 200 MG tablet Take 0.5 tablets (100 mg total) by mouth daily. 02/18/17   Allred, Jeneen Rinks, MD  apixaban (ELIQUIS) 5 MG TABS tablet Take 1 tablet (5 mg total) by mouth 2 (two) times daily. 01/26/17   Bensimhon, Shaune Pascal, MD  carvedilol (COREG) 12.5 MG tablet TAKE 1 & 1/2 TABLETS BY MOUTH 2 TIMES DAILY WITH A MEAL. 02/17/17   Larey Dresser, MD  Coenzyme Q10 (CO Q 10) 100 MG CAPS Take 100 mg by mouth daily.     [provider]  furosemide (LASIX) 40 MG tablet Take 1 tablet (40 mg total) by mouth 2 (two) times a week. Every Mon and Fri 02/26/17   Larey Dresser, MD  hydrALAZINE (APRESOLINE) 100 MG tablet Take 1 tablet (100 mg total) by mouth 3 (three) times daily. 02/17/17   Larey Dresser, MD  isosorbide mononitrate (IMDUR) 30 MG 24 hr tablet Take 3 tablets (90 mg total) by mouth daily. 02/10/17   Bensimhon, Shaune Pascal, MD  Magnesium Oxide 400 (240 MG) MG TABS Take 1 tablet by mouth daily. 08/10/14   Bensimhon, Shaune Pascal, MD  potassium chloride SA (K-DUR,KLOR-CON) 20 MEQ tablet Take 2 tablets (40 mEq total) by mouth 2 (two) times a week. Mondays and fridays with lasix 06/23/16   Larey Dresser, MD  spironolactone (ALDACTONE) 25 MG tablet Take 1 tablet (25 mg total) by mouth daily. 02/18/17   Thompson Grayer, MD    Family History Family History  Problem Relation Age of Onset  . Hypertension Unknown   . Diabetes Unknown   . Colon cancer Neg  Hx     Social History Social History  Substance Use Topics  . Smoking status: Never Smoker  . Smokeless tobacco: Never Used  . Alcohol use No     Allergies   Peanut-containing drug products; Ace inhibitors; Morphine; and Demerol   Review of Systems Review of Systems ROS: Statement: All systems negative except as marked or noted in the HPI; Constitutional: Negative for fever and chills. ; ; Eyes: +"blurry vision." Negative for eye pain, redness and discharge. ; ; ENMT: Negative for ear pain, hoarseness, nasal congestion, sinus pressure and sore throat. ; ; Cardiovascular: Negative for chest pain, palpitations, diaphoresis, dyspnea and peripheral edema. ; ; Respiratory: Negative for cough, wheezing and stridor. ; ; Gastrointestinal: Negative for nausea, vomiting, diarrhea, abdominal pain, blood in stool, hematemesis, jaundice and rectal  bleeding. . ; ; Genitourinary: Negative for dysuria, flank pain and hematuria. ; ; Musculoskeletal: Negative for back pain and neck pain. Negative for swelling and trauma.; ; Skin: Negative for pruritus, rash, abrasions, blisters, bruising and skin lesion.; ; Neuro: +headache. Negative for lightheadedness and neck stiffness. Negative for weakness, altered level of consciousness, altered mental status, extremity weakness, paresthesias, involuntary movement, seizure and syncope.       Physical Exam Updated Vital Signs BP (!) 147/88 (BP Location: Right Arm)   Pulse 76   Temp 98.9 F (37.2 C) (Oral)   Resp 18   Ht 5\' 4"  (1.626 m)   Wt 200 lb (90.7 kg)   LMP 10/28/1999   SpO2 100%   BMI 34.33 kg/m   Physical Exam 1825: Physical examination:  Nursing notes reviewed; Vital signs and O2 SAT reviewed;  Constitutional: Well developed, Well nourished, Well hydrated, In no acute distress; Head:  Normocephalic, atraumatic; Eyes: EOMI, PERRL, No scleral icterus; ENMT: TM's clear bilat. Mouth and pharynx normal, Mucous membranes moist; Neck: Supple, Full range  of motion, No lymphadenopathy; Cardiovascular: Regular rate and rhythm, No gallop; Respiratory: Breath sounds clear & equal bilaterally, No wheezes.  Speaking full sentences with ease, Normal respiratory effort/excursion; Chest: Nontender, Movement normal; Abdomen: Soft, Nontender, Nondistended, Normal bowel sounds; Genitourinary: No CVA tenderness; Spine:  No midline CS, TS, LS tenderness. +TTP left hypertonic trapezius muscle. No rash.;; Extremities: Pulses normal, No tenderness, No edema, No calf edema or asymmetry.; Neuro: AA&Ox3, Major CN grossly intact.  Speech clear. No gross focal motor or sensory deficits in extremities.; Skin: Color normal, Warm, Dry.   ED Treatments / Results  Labs (all labs ordered are listed, but only abnormal results are displayed)   EKG  EKG Interpretation None       Radiology   Procedures Procedures (including critical care time)  Medications Ordered in ED Medications  diphenhydrAMINE (BENADRYL) injection 50 mg (50 mg Intramuscular Given 03/07/17 1838)  dexamethasone (DECADRON) tablet 8 mg (8 mg Oral Given 03/07/17 1836)  metoCLOPramide (REGLAN) injection 10 mg (10 mg Intramuscular Given 03/07/17 1837)     Initial Impression / Assessment and Plan / ED Course  I have reviewed the triage vital signs and the nursing notes.  Pertinent labs & imaging results that were available during my care of the patient were reviewed by me and considered in my medical decision making (see chart for details).  MDM Reviewed: previous chart, nursing note and vitals Reviewed previous: CT scan Interpretation: CT scan   Ct Head Wo Contrast Result Date: 03/07/2017 CLINICAL DATA:  Posterior headache, photosensitivity, history of migraines EXAM: CT HEAD WITHOUT CONTRAST TECHNIQUE: Contiguous axial images were obtained from the base of the skull through the vertex without intravenous contrast. COMPARISON:  04/27/2012 FINDINGS: Brain: No evidence of acute infarction,  hemorrhage, hydrocephalus, extra-axial collection or mass lesion/mass effect. Vascular: No hyperdense vessel or unexpected calcification. Skull: Normal. Negative for fracture or focal lesion. Sinuses/Orbits: The visualized paranasal sinuses are essentially clear. The mastoid air cells are unopacified. Other: None. IMPRESSION: Normal head CT. Electronically Signed   By: Julian Hy M.D.   On: 03/07/2017 18:54    2000:  Pt feels better after meds and wants to go home now. Tx symptomatically at this time. Dx and testing d/w pt.  Questions answered.  Verb understanding, agreeable to d/c home with outpt f/u.   Final Clinical Impressions(s) / ED Diagnoses   Final diagnoses:  None    New Prescriptions New Prescriptions  No medications on file     Francine Graven, DO 03/11/17 1610

## 2017-03-07 NOTE — Discharge Instructions (Signed)
Take over the counter tylenol and benadryl, as directed on packaging, with the prescription given to you today, as needed for headache.  Keep a headache diary.  Call your regular medical doctor on Monday to schedule a follow up appointment within the next 3 days.  Return to the Emergency Department immediately sooner if worsening.

## 2017-03-07 NOTE — ED Notes (Signed)
Pt reports 3 days history of headache- she reports a hx of migraines and points to occipital area as pain site. She also reports R eye pain   Dr Gerarda Fraction is PCP

## 2017-03-07 NOTE — ED Notes (Signed)
Pt alert & oriented x4, stable gait. Patient given discharge instructions, paperwork & prescription(s). Patient verbalized understanding. Pt left department in wheelchair escorted by staff. Pt left w/ no further questions. 

## 2017-03-07 NOTE — ED Triage Notes (Signed)
PT c/o headache x3 days with hx of headaches.

## 2017-03-09 ENCOUNTER — Ambulatory Visit (INDEPENDENT_AMBULATORY_CARE_PROVIDER_SITE_OTHER): Payer: Medicare Other

## 2017-03-09 ENCOUNTER — Telehealth: Payer: Self-pay | Admitting: Cardiology

## 2017-03-09 DIAGNOSIS — I5022 Chronic systolic (congestive) heart failure: Secondary | ICD-10-CM | POA: Diagnosis not present

## 2017-03-09 DIAGNOSIS — Z95810 Presence of automatic (implantable) cardiac defibrillator: Secondary | ICD-10-CM | POA: Diagnosis not present

## 2017-03-09 NOTE — Telephone Encounter (Signed)
LMOVM reminding pt to send remote transmission.   

## 2017-03-10 ENCOUNTER — Telehealth: Payer: Self-pay

## 2017-03-10 NOTE — Telephone Encounter (Signed)
Remote ICM transmission received.  Attempted patient call and left message to return call.   

## 2017-03-10 NOTE — Progress Notes (Signed)
EPIC Encounter for ICM Monitoring  Patient Name: Anita Warren is a 56 y.o. female Date: 03/10/2017 Primary Care Physican: Redmond School, MD Primary Cardiologist: [name]  Electrophysiologist: Allred Dry Weight:  unknown      1st ICM encounter.  Patient returned call.  She has had some SOB that comes and goes but denied any at this time.  She has been sick with shingles and migraines in the last few weeks.  She had ER visit for the severe migraine and is feeling better.     Thoracic impedance normal but was abnormal suggesting fluid accumulation from 02/12/2017 to 02/19/2017 and 02/22/2017 to 02/24/2017.  Prescribed dosage: Furosemide 40 mg 1 tablet two times a week, Monday and Friday. Potassium 20 mEq 2 tablets twice a week on same days as Lasix is taken.  Labs: 07/16/2016 Creatinine 1.67, BUN 26, Potassium 3.5, Sodium 140, EGFR 34-39 06/23/2016 Creatinine 1.59, BUN 18, Potassium 3.9, Sodium 139, EGFR 36-41  04/28/2016 Creatinine 2.67, BUN 30, Potassium 4.1, Sodium 135, EGFR 19-22  04/25/2016 Creatinine 1.40, BUN 19, Potassium 3.5, Sodium 137, EGFR 42-48   Recommendations: Advised to limit salt intake and to call for any fluid symptoms.   Follow-up plan: ICM clinic phone appointment on 04/10/2017.  Office appointment scheduled on 03/26/2017 with HF clinic.  Copy of ICM check sent to device physician.   3 month ICM trend: 03/10/2017   1 Year ICM trend:      Rosalene Billings, RN 03/10/2017 7:30 AM

## 2017-03-10 NOTE — Progress Notes (Signed)
EPIC Encounter for ICM Monitoring  Patient Name: Anita Warren is a 56 y.o. female Date: 03/10/2017 Primary Care Physican: Fusco, Lawrence, MD Primary Cardiologist: [place]  Electrophysiologist: Allred Dry Weight:  unknown      1st ICM encounter.  Patient returned call.  She has had some SOB that comes and goes but denied any at this time.  She has been sick with shingles and migraines in the last few weeks.  She had ER visit for the severe migraine and is feeling better.     Thoracic impedance normal but was abnormal suggesting fluid accumulation from 02/12/2017 to 02/19/2017 and 02/22/2017 to 02/24/2017.  Prescribed dosage: Furosemide 40 mg 1 tablet two times a week, Monday and Friday. Potassium 20 mEq 2 tablets twice a week on same days as Lasix is taken.  Labs: 07/16/2016 Creatinine 1.67, BUN 26, Potassium 3.5, Sodium 140, EGFR 34-39 06/23/2016 Creatinine 1.59, BUN 18, Potassium 3.9, Sodium 139, EGFR 36-41  04/28/2016 Creatinine 2.67, BUN 30, Potassium 4.1, Sodium 135, EGFR 19-22  04/25/2016 Creatinine 1.40, BUN 19, Potassium 3.5, Sodium 137, EGFR 42-48   Recommendations: Advised to limit salt intake and to call for any fluid symptoms.   Follow-up plan: ICM clinic phone appointment on 04/10/2017.  Office appointment scheduled on 03/26/2017 with HF clinic.  Copy of ICM check sent to device physician.   3 month ICM trend: 03/10/2017   1 Year ICM trend:      Shannin Naab S Cheyene Hamric, RN 03/10/2017 7:30 AM   

## 2017-03-12 ENCOUNTER — Telehealth (HOSPITAL_COMMUNITY): Payer: Self-pay | Admitting: *Deleted

## 2017-03-12 MED ORDER — HYDRALAZINE HCL 100 MG PO TABS: 100.0000 mg | ORAL_TABLET | Freq: Three times a day (TID) | ORAL | 2 refills | Status: DC

## 2017-03-12 NOTE — Telephone Encounter (Signed)
Pt called regarding her Eliquis and Hydralazine, she states she has never gotten the hydralazine from [facility] and needs rx sent into [facility].  She states she can not afford the Eliquis, it cost about $200 for a month supply.  Per previous phone mess pt must pay $396 out of pocket on meds before she is eligible for help from [facility].  Pt needs to bring [place] a copy of med report from her pharmacy from 10/27/16 through present showing how much she has spent on her meds, advised once she gets this to [place] and we determine what is left for her to spend we can assist her with this, she will get this info from [facility] and [facility] and get it to [place].  Samples of Eliquis will be left a front desk, pt state her niece will pick them up for her  Medication Samples have been provided to the patient.  Drug name: Eliquis       Strength: 5mg         Qty: 4  LOT: [id number]  Exp.Date: 10/20  Dosing instructions: 1 tab Twice daily   The patient has been instructed regarding the correct time, dose, and frequency of taking this medication, including desired effects and most common side effects.   Anita Warren 10:39 AM 03/12/2017

## 2017-03-26 ENCOUNTER — Ambulatory Visit (HOSPITAL_COMMUNITY)
Admission: RE | Admit: 2017-03-26 | Discharge: 2017-03-26 | Disposition: A | Discharge status: Home / Self Care | Payer: Medicare Other | Source: Ambulatory Visit | Attending: Cardiology | Admitting: Cardiology

## 2017-03-26 ENCOUNTER — Encounter (HOSPITAL_COMMUNITY): Payer: Self-pay

## 2017-03-26 VITALS — BP 134/76 | HR 73 | Wt 198.5 lb

## 2017-03-26 DIAGNOSIS — I429 Cardiomyopathy, unspecified: Secondary | ICD-10-CM | POA: Diagnosis not present

## 2017-03-26 DIAGNOSIS — I5023 Acute on chronic systolic (congestive) heart failure: Secondary | ICD-10-CM | POA: Insufficient documentation

## 2017-03-26 DIAGNOSIS — Z87442 Personal history of urinary calculi: Secondary | ICD-10-CM | POA: Insufficient documentation

## 2017-03-26 DIAGNOSIS — I4892 Unspecified atrial flutter: Secondary | ICD-10-CM | POA: Insufficient documentation

## 2017-03-26 DIAGNOSIS — N183 Chronic kidney disease, stage 3 (moderate): Secondary | ICD-10-CM | POA: Insufficient documentation

## 2017-03-26 DIAGNOSIS — Z87898 Personal history of other specified conditions: Secondary | ICD-10-CM | POA: Diagnosis not present

## 2017-03-26 DIAGNOSIS — I130 Hypertensive heart and chronic kidney disease with heart failure and stage 1 through stage 4 chronic kidney disease, or unspecified chronic kidney disease: Secondary | ICD-10-CM | POA: Insufficient documentation

## 2017-03-26 DIAGNOSIS — Z8249 Family history of ischemic heart disease and other diseases of the circulatory system: Secondary | ICD-10-CM | POA: Diagnosis not present

## 2017-03-26 DIAGNOSIS — Z950 Presence of cardiac pacemaker: Secondary | ICD-10-CM | POA: Insufficient documentation

## 2017-03-26 DIAGNOSIS — I5022 Chronic systolic (congestive) heart failure: Secondary | ICD-10-CM

## 2017-03-26 DIAGNOSIS — I480 Paroxysmal atrial fibrillation: Secondary | ICD-10-CM | POA: Diagnosis not present

## 2017-03-26 DIAGNOSIS — E669 Obesity, unspecified: Secondary | ICD-10-CM | POA: Diagnosis not present

## 2017-03-26 DIAGNOSIS — Z7901 Long term (current) use of anticoagulants: Secondary | ICD-10-CM | POA: Diagnosis not present

## 2017-03-26 DIAGNOSIS — T783XXA Angioneurotic edema, initial encounter: Secondary | ICD-10-CM | POA: Insufficient documentation

## 2017-03-26 LAB — COMPREHENSIVE METABOLIC PANEL
ALBUMIN: 3.4 g/dL — AB (ref 3.5–5.0)
ALK PHOS: 43 U/L (ref 38–126)
ALT: 10 U/L — ABNORMAL LOW (ref 14–54)
AST: 14 U/L — ABNORMAL LOW (ref 15–41)
Anion gap: 7 (ref 5–15)
BILIRUBIN TOTAL: 0.8 mg/dL (ref 0.3–1.2)
BUN: 16 mg/dL (ref 6–20)
CALCIUM: 8.9 mg/dL (ref 8.9–10.3)
CO2: 23 mmol/L (ref 22–32)
Chloride: 108 mmol/L (ref 101–111)
Creatinine, Ser: 1.45 mg/dL — ABNORMAL HIGH (ref 0.44–1.00)
GFR calc Af Amer: 46 mL/min — ABNORMAL LOW (ref >60)
GFR, EST NON AFRICAN AMERICAN: 40 mL/min — AB (ref >60)
GLUCOSE: 113 mg/dL — AB (ref 65–99)
Potassium: 3.8 mmol/L (ref 3.5–5.1)
Sodium: 138 mmol/L (ref 135–145)
TOTAL PROTEIN: 7.8 g/dL (ref 6.5–8.1)

## 2017-03-26 LAB — CBC
HCT: 33.3 % — ABNORMAL LOW (ref 36.0–46.0)
Hemoglobin: 10.7 g/dL — ABNORMAL LOW (ref 12.0–15.0)
MCH: 29.2 pg (ref 26.0–34.0)
MCHC: 32.1 g/dL (ref 30.0–36.0)
MCV: 91 fL (ref 78.0–100.0)
PLATELETS: 186 10*3/uL (ref 150–400)
RBC: 3.66 MIL/uL — ABNORMAL LOW (ref 3.87–5.11)
RDW: 15.8 % — ABNORMAL HIGH (ref 11.5–15.5)
WBC: 5.8 10*3/uL (ref 4.0–10.5)

## 2017-03-26 LAB — TSH: TSH: 0.789 u[IU]/mL (ref 0.350–4.500)

## 2017-03-26 NOTE — Patient Instructions (Signed)
Stop Amiodarone  Labs today  Your physician recommends that you schedule a follow-up appointment in: August with an echocardiogram

## 2017-03-28 NOTE — Progress Notes (Signed)
Patient ID: Anita Warren, female   DOB: 05-Nov-1960, 56 y.o.   MRN: [id number] Primary Physician: Glo Herring., MD  Primary Cardiologist: Carlyle Dolly MD [facility]) EP: Dr Rayann Heman  Urology: Dr Risa Grill  HPI: [name] is a 56 y.o.female with history of viral, non-ischemic cardiomyopathy, severe systolic dysfunction with EF of 10% s/p ICD pacemaker in the setting of VT 02/2011 (St. Jude);  Most recent cath 2012 with no CAD.   Admitted 7/15 with acute on chronic systolic CHF she was not taking medications for 4 months because she lost her insurance. She was diuresed with IV lasix and transitioned to lasix 40 mg daily. She was also discharged on 12.5 mg carvedilol twice a day, hydralazine 25 mg tid, and 30 mg Imdur daily. Discharge weight was 176 pounds. She was not on an ACEI due to angioedema.  Admitted from HF clinic 06/02/14 with low output heart failure. Swan placed and showed cardiogenic shock. Ultimately discharged on  Milrinone 0.25 mcg/kg/min. Hospital stay was complicated by atrial flutter. Loaded on amiodarone and started on eliquis 5 mg twice a day. Met with Dr Prescott Gum and VAD coordinator. She was titrated off the milrinone.  Most recent echo in 8/17 showed EF up to 50%.    Today she returns for HF follow up. She is no longer on ACEI or ARB because of recurrent angioedema.  She has been under more stress recently, has separated from her husband.  She has migraine headaches.  Weight is up.  Short of breath walking fast, no problems walking at a moderate steady pace.  No chest pain. No orthopnea.  ?Episode of PND last night.  No palpitations.  No BRBPR/melena.   Corevue: Stable impedance, no volume overload.   05/23/14 ECHO EF 5-10% RV mod to severely dilated.  08/09/14 ECHO EF 20-25% RV read as normal 8/17 ECHO EF 50%, mild LV dilation, normal RV size and systolic function.   Labs:  HIV negative 2015  9/15: K 3.6 => 4.6, creatinine 0.99 => 1.5 => 1.2, Mg 1.9, HCT 45.2, LFTs  normal, TSH normal 9/15: PFTs normal 07/31/14: Co-ox off milrinone 66%  K 4.5 Cr 1.36 08/23/14: k 4.4, cr 1.51 digoxin 0.7 09/20/14: K 4.9, creatinine 1.64  12/15: K 4.4, creatinine 1.36 2/16: K 4.2, creatinine 1.67, HCT 33.9 04/28/2016: K 4.1 Creatinine 2.67  8/17: TSH normal, LFTs normal 9/17: K 3.5, creatinine 1.67, hgb 9.9  2013 Colonoscopy Blood Type:  B+  Lower Extremity Doppler - Normal arterial flow Carotid Doppler-  1-39% stenosis involving the right internal carotid artery and the left internal carotid Artery.  SH: Disabled  Lives with husband and son. Has 3 grown children. Does not smoke or drink alcohol.. 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    FH: Mom died at 61 CAD         Sister and Brother HTN   ROS: All systems negative except as listed in HPI, PMH and Problem List.  Past Medical History:  Diagnosis Date  . AICD (automatic cardioverter/defibrillator) present    St. Jude AICD implanted 03-07-2011 Dr. Allred/-Dr. [name] now follows  . CHF (congestive heart failure) ([place])    meds controlling, no episodes since 2014  . Chronic systolic heart failure ([place])   . Essential hypertension, benign   . Headache   . History of kidney stones    multiple kidney stones in past  . History of medication noncompliance   . Hydronephrosis with renal and ureteral calculus obstruction 09/05/2013  . Nonischemic  cardiomyopathy ([place])    LVEF 5-10%, likely viral (no CAD by cath 01/30/11)  . NSVT (nonsustained ventricular tachycardia) ([place])   . Obesity     Current Outpatient Prescriptions  Medication Sig Dispense Refill  . acetaminophen (TYLENOL) 500 MG tablet Take 1,000 mg by mouth every 6 (six) hours as needed for moderate pain.    Marland Kitchen apixaban (ELIQUIS) 5 MG TABS tablet Take 1 tablet (5 mg total) by mouth 2 (two) times daily. 60 tablet 11  . carvedilol (COREG) 12.5 MG tablet TAKE 1 & 1/2 TABLETS BY MOUTH 2 TIMES DAILY WITH A MEAL. 270 tablet 2  . Coenzyme Q10 (CO Q 10) 100 MG CAPS Take 100  mg by mouth daily.     . furosemide (LASIX) 40 MG tablet Take 1 tablet (40 mg total) by mouth 2 (two) times a week. Every Mon and Fri 45 tablet 3  . hydrALAZINE (APRESOLINE) 100 MG tablet Take 1 tablet (100 mg total) by mouth 3 (three) times daily. 90 tablet 2  . isosorbide mononitrate (IMDUR) 30 MG 24 hr tablet Take 3 tablets (90 mg total) by mouth daily. 90 tablet 6  . Magnesium Oxide 400 (240 MG) MG TABS Take 1 tablet by mouth daily. 90 tablet 3  . potassium chloride SA (K-DUR,KLOR-CON) 20 MEQ tablet Take 2 tablets (40 mEq total) by mouth 2 (two) times a week. Mondays and fridays with lasix 8 tablet 0  . spironolactone (ALDACTONE) 25 MG tablet Take 1 tablet (25 mg total) by mouth daily. 90 tablet 2  . metoCLOPramide (REGLAN) 10 MG tablet Take 1 tablet (10 mg total) by mouth every 6 (six) hours as needed for nausea (or headache). (Patient not taking: Reported on 03/26/2017) 6 tablet 0   No current facility-administered medications for this encounter.      PHYSICAL EXAM: BP 134/76   Pulse 73   Wt 198 lb 8 oz (90 kg)   LMP 10/28/1999   SpO2 100%   BMI 34.07 kg/m  General:  NAD   HEENT: normal Neck: supple.  No JVD. Carotids 2+ bilaterally; no bruits. No lymphadenopathy or thryomegaly appreciated. Cor: PMI normal. Regular rate & rhythm. No rubs, or murmurs. No S3/S4 Lungs: clear Abdomen: soft, nontender, nondistended. No hepatosplenomegaly. No bruits or masses. Good bowel sounds. Extremities: no cyanosis, clubbing, rash. Lower extremities cool. No edema.  Neuro: alert & orientedx3, cranial nerves grossly intact. Moves all 4 extremities w/o difficulty. Affect pleasant.  ASSESSMENT & PLAN: 1. Chronic Systolic Heart Failure: Nonischemic cardiomyopathy thought to potentially be due to viral myocarditis noted initially in 2011. Cath 2012 normal coronaries.  St Jude ICD. Echo (7/15) with EF 5-10% and RV mod-severely dilated with moderately decreased systolic function. Repeat echo 10/15 with EF  up to 20-25%. She was on milrinone briefly, now off.  Most recent echo in 8/17 with EF up to 50%.  NYHA class II symptoms, she is not volume overloaded on exam.  - Continue Lasix 40 mg twice weekly. - Continue spironolactone 25 mg daily. BMET today.  - No ACEI/ARB/ARNI due to recurrent episodes of angioedema.  - Continue Coreg to 18.75 mg bid.  - Continue hydralazine 100 mg tid and Imdur 90 mg daily.   2. Atrial flutter: Paroxysmal, NSR today.  - Continue Eliquis 5 mg bid. CBC today.  - I think she can try stopping amiodarone at this point. If atrial flutter returns, would plan ablation.  3. Angioedema: Had in past on ACEI, had another episode after ACEI stopped.  She is no longer on ACEI, ARNI, or ARB. ?Hereditary or acquired C1 inhibitor deficiency. No further problems with this recently.  4. CKD: Stage 3. Check BMET today.    Loralie Champagne 03/28/2017

## 2017-03-31 ENCOUNTER — Other Ambulatory Visit (HOSPITAL_COMMUNITY): Payer: Self-pay | Admitting: Pharmacist

## 2017-03-31 MED ORDER — APIXABAN 5 MG PO TABS: 5.0000 mg | ORAL_TABLET | Freq: Two times a day (BID) | ORAL | 3 refills | Status: DC

## 2017-04-08 ENCOUNTER — Other Ambulatory Visit: Payer: Self-pay | Admitting: Internal Medicine

## 2017-04-10 ENCOUNTER — Ambulatory Visit (INDEPENDENT_AMBULATORY_CARE_PROVIDER_SITE_OTHER): Payer: Medicare Other

## 2017-04-10 ENCOUNTER — Telehealth: Payer: Self-pay | Admitting: Cardiology

## 2017-04-10 DIAGNOSIS — Z95810 Presence of automatic (implantable) cardiac defibrillator: Secondary | ICD-10-CM | POA: Diagnosis not present

## 2017-04-10 DIAGNOSIS — I5022 Chronic systolic (congestive) heart failure: Secondary | ICD-10-CM

## 2017-04-10 NOTE — Telephone Encounter (Signed)
Spoke with pt and reminded pt of remote transmission that is due today. Pt verbalized understanding.   

## 2017-04-10 NOTE — Progress Notes (Signed)
EPIC Encounter for ICM Monitoring  Patient Name: Anita Warren is a 56 y.o. female Date: 04/10/2017 Primary Care Physican: Redmond School, MD Primary Cardiologist: [name]  Electrophysiologist: Allred Dry Weight:     unknown       Heart Failure questions reviewed, pt asymptomatic.   Thoracic impedance has been normal but just starting to trend below baseline today suggesting fluid accumulation and she takes Furosemide on Fridays.    Prescribed dosage: Furosemide 40 mg 1 tablet two times a week, Monday and Friday. Potassium 20 mEq 2 tablets twice a week on same days as Lasix is taken.  Labs: 03/26/2017 Creatinine 1.45, BUN 16, Potassium 3.8, Sodium 138, EGFR 40-46 07/16/2016 Creatinine 1.67, BUN 26, Potassium 3.5, Sodium 140, EGFR 34-39 06/23/2016 Creatinine 1.59, BUN 18, Potassium 3.9, Sodium 139, EGFR 36-41  04/28/2016 Creatinine 2.67, BUN 30, Potassium 4.1, Sodium 135, EGFR 19-22  04/25/2016 Creatinine 1.40, BUN 19, Potassium 3.5, Sodium 137, EGFR 42-48   Recommendations:  Patient took fluid pill today and impedance should start to trend back to baseline.  She stated she thinks she may be drinking too much water and will limit to 2 liters a day.  She stated she has a lot of stress right now because her son just move himself and family back into her home.   Encouraged to call for fluid symptoms.  Follow-up plan: ICM clinic phone appointment on 05/20/2017.  Office appointment scheduled on 06/16/2017 with Dr. [name].  Copy of ICM check sent to primary cardiologist and device physician.   3 month ICM trend: 04/10/2017   1 Year ICM trend:      Rosalene Billings, RN 04/10/2017 12:26 PM

## 2017-04-16 ENCOUNTER — Telehealth (HOSPITAL_COMMUNITY): Payer: Self-pay | Admitting: Pharmacist

## 2017-04-16 NOTE — Telephone Encounter (Signed)
Pt called and LVM asking RPh to call her back. Called patient, no answer, left HIPAA compliant message.   Carlean Jews, Pharm.D. PGY1 Pharmacy Resident 6/21/20183:40 PM Pager [phone]

## 2017-04-17 NOTE — Telephone Encounter (Signed)
Patient called back and left HIPAA compliant VM  Carlean Jews, Pharm.D. PGY1 Pharmacy Resident 6/22/20188:35 AM Pager [phone]

## 2017-04-17 NOTE — Telephone Encounter (Signed)
Pt calls stating that she has been out of Eliquis for three days as the PA for Eliquis has not been approved yet.  Patient denies s/sx of stroke. Patient verbalized understanding that if she exhibits stroke-like sx she should come to the ED as fast as possible.  Medication Samples have been provided to the patient.  Drug: Eliquis Strength: 5mg  Qty: 42   LOT: [id number], [id number] Exp.Date: 07/2019, 01/2019 Dosing instructions: Take 1 tab by mouth twice daily  The patient has been instructed regarding the correct time, dose, and frequency of taking this medication, including desired effects and most common side effects.   Samples signed for by Deirdre Pippins, PharmD  Anita Warren 10:11 AM 04/17/2017

## 2017-04-19 ENCOUNTER — Emergency Department (HOSPITAL_COMMUNITY): Payer: Medicare Other

## 2017-04-19 ENCOUNTER — Encounter (HOSPITAL_COMMUNITY): Payer: Self-pay | Admitting: *Deleted

## 2017-04-19 DIAGNOSIS — Z79899 Other long term (current) drug therapy: Secondary | ICD-10-CM | POA: Insufficient documentation

## 2017-04-19 DIAGNOSIS — X501XXA Overexertion from prolonged static or awkward postures, initial encounter: Secondary | ICD-10-CM | POA: Diagnosis not present

## 2017-04-19 DIAGNOSIS — M7989 Other specified soft tissue disorders: Secondary | ICD-10-CM | POA: Diagnosis not present

## 2017-04-19 DIAGNOSIS — I5023 Acute on chronic systolic (congestive) heart failure: Secondary | ICD-10-CM | POA: Diagnosis not present

## 2017-04-19 DIAGNOSIS — I132 Hypertensive heart and chronic kidney disease with heart failure and with stage 5 chronic kidney disease, or end stage renal disease: Secondary | ICD-10-CM | POA: Insufficient documentation

## 2017-04-19 DIAGNOSIS — Y939 Activity, unspecified: Secondary | ICD-10-CM | POA: Insufficient documentation

## 2017-04-19 DIAGNOSIS — N185 Chronic kidney disease, stage 5: Secondary | ICD-10-CM | POA: Insufficient documentation

## 2017-04-19 DIAGNOSIS — Y92810 Car as the place of occurrence of the external cause: Secondary | ICD-10-CM | POA: Diagnosis not present

## 2017-04-19 DIAGNOSIS — S8392XA Sprain of unspecified site of left knee, initial encounter: Secondary | ICD-10-CM | POA: Insufficient documentation

## 2017-04-19 DIAGNOSIS — S8992XA Unspecified injury of left lower leg, initial encounter: Secondary | ICD-10-CM | POA: Diagnosis present

## 2017-04-19 DIAGNOSIS — Y999 Unspecified external cause status: Secondary | ICD-10-CM | POA: Insufficient documentation

## 2017-04-19 NOTE — ED Triage Notes (Signed)
Pt states she was getting out of car today and twisted her left knee; left knee has swelling

## 2017-04-20 ENCOUNTER — Emergency Department (HOSPITAL_COMMUNITY)
Admission: EM | Admit: 2017-04-20 | Discharge: 2017-04-20 | Disposition: A | Discharge status: Home / Self Care | Payer: Medicare Other | Attending: Emergency Medicine | Admitting: Emergency Medicine

## 2017-04-20 DIAGNOSIS — S8392XA Sprain of unspecified site of left knee, initial encounter: Secondary | ICD-10-CM

## 2017-04-20 DIAGNOSIS — M7989 Other specified soft tissue disorders: Secondary | ICD-10-CM | POA: Diagnosis not present

## 2017-04-20 DIAGNOSIS — R52 Pain, unspecified: Secondary | ICD-10-CM

## 2017-04-20 DIAGNOSIS — S8992XA Unspecified injury of left lower leg, initial encounter: Secondary | ICD-10-CM | POA: Diagnosis not present

## 2017-04-20 MED ORDER — TRAMADOL HCL 50 MG PO TABS: 50.0000 mg | ORAL_TABLET | Freq: Four times a day (QID) | ORAL | 0 refills | Status: DC | PRN

## 2017-04-20 MED ORDER — OXYCODONE-ACETAMINOPHEN 5-325 MG PO TABS
1.0000 | ORAL_TABLET | Freq: Once | ORAL | Status: AC
  Administered 2017-04-20: 1 via ORAL
  Filled 2017-04-20: qty 1

## 2017-04-20 MED ORDER — IBUPROFEN 800 MG PO TABS: 800.0000 mg | ORAL_TABLET | Freq: Three times a day (TID) | ORAL | 0 refills | Status: DC

## 2017-04-20 NOTE — ED Provider Notes (Signed)
[facility] DEPT Provider Note   CSN: [id number] Arrival date & time: 04/19/17  2307     History   Chief Complaint Chief Complaint  Patient presents with  . Knee Pain    HPI Anita Warren is a 56 y.o. female.  Patient presents with complaints of left knee pain. Patient reports that she twisted her knee getting out of the car earlier tonight. She did rest, elevate and ice it. She also placed a knee immobilizer on at that she had from a previous injury. She reports persistent pain, so she came in to get evaluated. Pain is on the medial aspect of the knee and worsens with movement.      Past Medical History:  Diagnosis Date  . AICD (automatic cardioverter/defibrillator) present    St. Jude AICD implanted 03-07-2011 Dr. Allred/-Dr. [name] now follows  . CHF (congestive heart failure) ([place])    meds controlling, no episodes since 2014  . Chronic systolic heart failure ([place])   . Essential hypertension, benign   . Headache   . History of kidney stones    multiple kidney stones in past  . History of medication noncompliance   . Hydronephrosis with renal and ureteral calculus obstruction 09/05/2013  . Nonischemic cardiomyopathy ([facility])    LVEF 5-10%, likely viral (no CAD by cath 01/30/11)  . NSVT (nonsustained ventricular tachycardia) ([place])   . Obesity     Patient Active Problem List   Diagnosis Date Noted  . Ureteral calculus 04/28/2016  . Paroxysmal atrial fibrillation ([place]) 12/07/2014  . Hx of angioedema 10/25/2014  . CKD (chronic kidney disease) stage 3, GFR 30-59 ml/min 09/20/2014  . Chronic systolic heart failure ([place]) 06/14/2014  . Atrial flutter ([place]) 06/06/2014  . Palliative care encounter 06/06/2014  . CHF exacerbation ([place]) 05/23/2014  . Acute on chronic renal failure ([place]) 05/23/2014  . Noncompliance with medication regimen 05/23/2014  . SOB (shortness of breath) 05/23/2014  . Hyperbilirubinemia 10/29/2013  . Acute systolic CHF (congestive heart  failure) ([place]) 10/29/2013  . H/O viral gastroenteritis 10/28/2013  . Acute systolic CHF (congestive heart failure), NYHA class 3 ([place]) 10/28/2013  . Acute renal insufficiency 09/07/2013  . Pyelonephritis, acute 09/05/2013  . Hydronephrosis with renal and ureteral calculus obstruction 09/05/2013  . Acute on chronic systolic heart failure ([place]) 07/04/2013  . Encounter for screening colonoscopy 06/01/2012  . Obesity 06/27/2011  . HYPERTENSION 10/18/2010  . 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 ABDOMINAL HYSTERECTOMY    . BREAST LUMPECTOMY  1989   L breast- benign  . CARDIAC DEFIBRILLATOR PLACEMENT  5/12   SJM by JA  . CESAREAN SECTION     x 2  . CHOLECYSTECTOMY    . COLONOSCOPY  07/02/2012   Procedure: COLONOSCOPY;  Surgeon: [name], MD;  Location: [facility] ENDO SUITE;  Service: Endoscopy;  Laterality: N/A;  1:15/PATIENT HAS A DEFIBRILLATOR  . CYSTOSCOPY W/ URETERAL STENT PLACEMENT Right 09/06/2013   Procedure: CYSTOSCOPY WITH RIGHT RETROGRADE PYELOGRAM; RIGHT URETERAL STENT PLACEMENT;  Surgeon: [name], MD;  Location: [facility] ORS;  Service: Urology;  Laterality: Right;  . CYSTOSCOPY W/ URETERAL STENT PLACEMENT N/A 04/28/2016   Procedure: CYSTOSCOPY WITH  RIGHT RETROGRADE PYELOGRAM/RIGHT URETERAL STENT PLACEMENT;  Surgeon: Rana Snare, MD;  Location: [facility] ORS;  Service: Urology;  Laterality: N/A;  . CYSTOSCOPY W/ URETERAL STENT REMOVAL Right 07/21/2016   Procedure: CYSTOSCOPY WITH STENT REMOVAL;  Surgeon: Franchot Gallo, MD;  Location: [facility] ORS;  Service: Urology;  Laterality: Right;  . CYSTOSCOPY/URETEROSCOPY/HOLMIUM LASER/STENT PLACEMENT Right 07/21/2016   Procedure: CYSTOSCOPY/URETEROSCOPY/HOLMIUM LASER/   right retrograde pylegram;  Surgeon: Franchot Gallo, MD;  Location: [facility] ORS;  Service: Urology;  Laterality: Right;  . TONSILLECTOMY    . TUBAL LIGATION      OB History    Gravida Para Term Preterm AB Living              3   SAB TAB Ectopic Multiple Live Births                   Home Medications    Prior to Admission medications   Medication Sig Start Date End Date Taking? Authorizing Provider  acetaminophen (TYLENOL) 500 MG tablet Take 1,000 mg by mouth every 6 (six) hours as needed for moderate pain.    [provider]  apixaban (ELIQUIS) 5 MG TABS tablet Take 1 tablet (5 mg total) by mouth 2 (two) times daily. 03/31/17   Bensimhon, Shaune Pascal, MD  carvedilol (COREG) 12.5 MG tablet TAKE 1 & 1/2 TABLETS BY MOUTH 2 TIMES DAILY WITH A MEAL. 02/17/17   Larey Dresser, MD  Coenzyme Q10 (CO Q 10) 100 MG CAPS Take 100 mg by mouth daily.     [provider]  furosemide (LASIX) 40 MG tablet Take 1 tablet (40 mg total) by mouth 2 (two) times a week. Every Mon and Fri 02/26/17   Larey Dresser, MD  hydrALAZINE (APRESOLINE) 100 MG tablet Take 1 tablet (100 mg total) by mouth 3 (three) times daily. 03/12/17   Larey Dresser, MD  ibuprofen (ADVIL,MOTRIN) 800 MG tablet Take 1 tablet (800 mg total) by mouth 3 (three) times daily. 04/20/17   Orpah Greek, MD  isosorbide mononitrate (IMDUR) 30 MG 24 hr tablet Take 3 tablets (90 mg total) by mouth daily. 02/10/17   Bensimhon, Shaune Pascal, MD  Magnesium Oxide 400 (240 MG) MG TABS Take 1 tablet by mouth daily. 08/10/14   Bensimhon, Shaune Pascal, MD  metoCLOPramide (REGLAN) 10 MG tablet Take 1 tablet (10 mg total) by mouth every 6 (six) hours as needed for nausea (or headache). Patient not taking: Reported on 03/26/2017 03/07/17   Francine Graven, DO  potassium chloride SA (K-DUR,KLOR-CON) 20 MEQ tablet Take 2 tablets (40 mEq total) by mouth 2 (two) times a week. Mondays and fridays with lasix 06/23/16   Larey Dresser, MD  spironolactone (ALDACTONE) 25 MG tablet Take 1 tablet (25 mg total) by mouth daily. 02/18/17   Allred, Jeneen Rinks, MD  traMADol (ULTRAM) 50 MG tablet Take 1 tablet (50 mg total) by mouth every 6 (six) hours as needed. 04/20/17   Orpah Greek, MD    Family History Family History  Problem Relation Age of Onset  . Hypertension Unknown   . Diabetes Unknown   . Colon cancer Neg Hx     Social History Social History  Substance Use Topics  . Smoking status: Never Smoker  . Smokeless tobacco: Never Used  . Alcohol use No     Allergies   Peanut-containing drug products; Ace inhibitors; Morphine; and Demerol   Review of Systems Review of Systems  Musculoskeletal: Positive for arthralgias.     Physical Exam Updated Vital Signs BP (!) 142/81 (BP Location: Right Arm)   Pulse 62   Temp 98.4 F (36.9 C) (Oral)   Resp 18   LMP 10/28/1999   SpO2 99%   Physical Exam  Constitutional: She appears  well-developed.  HENT:  Head: Atraumatic.  Cardiovascular: Normal rate.   Pulmonary/Chest: Effort normal and breath sounds normal.  Musculoskeletal:       Left knee: She exhibits swelling (Medial, infrapatellar region). She exhibits normal range of motion, no effusion, no ecchymosis, no deformity, no erythema, no LCL laxity, normal patellar mobility and no MCL laxity.     ED Treatments / Results  Labs (all labs ordered are listed, but only abnormal results are displayed) Labs Reviewed - No data to display  EKG  EKG Interpretation None       Radiology Dg Knee Complete 4 Views Left  Result Date: 04/20/2017 CLINICAL DATA:  Medial left knee pain with swelling after twisting injury today. EXAM: LEFT KNEE - COMPLETE 4+ VIEW COMPARISON:  None. FINDINGS: Negative for acute fracture or dislocation. Minimal arthritic changes are present in the medial and patellofemoral compartments. No bone lesion or bony destruction. No acute soft tissue abnormality. IMPRESSION: Minimal arthritic changes.  No acute findings. Electronically Signed   By: Andreas Newport M.D.   On: 04/20/2017 00:46    Procedures Procedures (including critical care time)  Medications Ordered in ED Medications  oxyCODONE-acetaminophen  (PERCOCET/ROXICET) 5-325 MG per tablet 1 tablet (1 tablet Oral Given 04/20/17 0134)     Initial Impression / Assessment and Plan / ED Course  I have reviewed the triage vital signs and the nursing notes.  Pertinent labs & imaging results that were available during my care of the patient were reviewed by me and considered in my medical decision making (see chart for details).     Patient with acute knee injury after a twisting motion while getting out of the car. No obvious ligamentous instability noted. No joint effusion noted. X-ray negative. Patient will treat with rice and NSAIDs. Follow-up with orthopedics if not improving in 5 days.  Final Clinical Impressions(s) / ED Diagnoses   Final diagnoses:  Pain  Sprain of left knee, unspecified ligament, initial encounter    New Prescriptions New Prescriptions   IBUPROFEN (ADVIL,MOTRIN) 800 MG TABLET    Take 1 tablet (800 mg total) by mouth 3 (three) times daily.   TRAMADOL (ULTRAM) 50 MG TABLET    Take 1 tablet (50 mg total) by mouth every 6 (six) hours as needed.     Orpah Greek, MD 04/20/17 [phone]

## 2017-05-20 ENCOUNTER — Ambulatory Visit (INDEPENDENT_AMBULATORY_CARE_PROVIDER_SITE_OTHER): Payer: Medicare Other | Admitting: *Deleted

## 2017-05-20 DIAGNOSIS — Z95810 Presence of automatic (implantable) cardiac defibrillator: Secondary | ICD-10-CM

## 2017-05-20 DIAGNOSIS — I428 Other cardiomyopathies: Secondary | ICD-10-CM

## 2017-05-20 DIAGNOSIS — I5022 Chronic systolic (congestive) heart failure: Secondary | ICD-10-CM | POA: Diagnosis not present

## 2017-05-20 NOTE — Progress Notes (Signed)
Remote ICD transmission.   

## 2017-05-21 ENCOUNTER — Encounter: Payer: Self-pay | Admitting: Cardiology

## 2017-05-21 NOTE — Progress Notes (Signed)
EPIC Encounter for ICM Monitoring  Patient Name: Anita Warren is a 56 y.o. female Date: 05/21/2017 Primary Care Physican: Redmond School, MD Primary Cardiologist: [name]  Electrophysiologist: Allred Dry Weight:unknown                                                   Heart Failure questions reviewed, pt asymptomatic.   Thoracic impedance has been abnormal for last few days but is back at baseline today.   Prescribed dosage: Furosemide 40 mg 1 tablet two times a week, Monday and Friday. Potassium 20 mEq 2 tablets twice a week on same days as Lasix is taken.  Labs: 03/26/2017 Creatinine 1.45, BUN 16, Potassium 3.8, Sodium 138, EGFR 40-46 07/16/2016 Creatinine 1.67, BUN 26, Potassium 3.5, Sodium 140, EGFR 34-39 06/23/2016 Creatinine 1.59, BUN 18, Potassium 3.9, Sodium 139, EGFR 36-41  04/28/2016 Creatinine 2.67, BUN 30, Potassium 4.1, Sodium 135, EGFR 19-22  04/25/2016 Creatinine 1.40, BUN 19, Potassium 3.5, Sodium 137, EGFR 42-48   Recommendations:   No changes.  Advised to limit salt intake to 2000 mg/day and fluid intake to < 2 liters/day.  Encouraged to call for fluid symptoms.  Follow-up plan: ICM clinic phone appointment on 06/22/2017.  Office appointment scheduled 06/16/2017 with Dr. [name].  Copy of ICM check sent to primary device physician.   3 month ICM trend: 05/21/2017   1 Year ICM trend:      Rosalene Billings, RN 05/21/2017 4:57 PM

## 2017-06-03 ENCOUNTER — Telehealth (HOSPITAL_COMMUNITY): Payer: Self-pay | Admitting: Pharmacist

## 2017-06-03 NOTE — Telephone Encounter (Signed)
[employer] patient assistance approved for Eliquis 5 mg BID through 10/26/17.   Ruta Hinds. Velva Harman, PharmD, BCPS, CPP Clinical Pharmacist Pager: [phone] Phone: [phone] 06/03/2017 11:58 AM

## 2017-06-08 ENCOUNTER — Encounter: Payer: Self-pay | Admitting: Gastroenterology

## 2017-06-16 ENCOUNTER — Ambulatory Visit (HOSPITAL_COMMUNITY)
Admission: RE | Admit: 2017-06-16 | Discharge: 2017-06-16 | Disposition: A | Discharge status: Home / Self Care | Payer: Medicare Other | Source: Ambulatory Visit | Attending: Cardiology | Admitting: Cardiology

## 2017-06-16 ENCOUNTER — Ambulatory Visit (HOSPITAL_BASED_OUTPATIENT_CLINIC_OR_DEPARTMENT_OTHER)
Admission: RE | Admit: 2017-06-16 | Discharge: 2017-06-16 | Disposition: A | Discharge status: Home / Self Care | Payer: Medicare Other | Source: Ambulatory Visit | Attending: Cardiology | Admitting: Cardiology

## 2017-06-16 VITALS — BP 130/72 | HR 63 | Wt 181.5 lb

## 2017-06-16 DIAGNOSIS — I130 Hypertensive heart and chronic kidney disease with heart failure and stage 1 through stage 4 chronic kidney disease, or unspecified chronic kidney disease: Secondary | ICD-10-CM | POA: Diagnosis not present

## 2017-06-16 DIAGNOSIS — E669 Obesity, unspecified: Secondary | ICD-10-CM | POA: Insufficient documentation

## 2017-06-16 DIAGNOSIS — Z8249 Family history of ischemic heart disease and other diseases of the circulatory system: Secondary | ICD-10-CM | POA: Diagnosis not present

## 2017-06-16 DIAGNOSIS — Z6831 Body mass index (BMI) 31.0-31.9, adult: Secondary | ICD-10-CM | POA: Diagnosis not present

## 2017-06-16 DIAGNOSIS — N183 Chronic kidney disease, stage 3 unspecified: Secondary | ICD-10-CM

## 2017-06-16 DIAGNOSIS — I4892 Unspecified atrial flutter: Secondary | ICD-10-CM | POA: Insufficient documentation

## 2017-06-16 DIAGNOSIS — Z950 Presence of cardiac pacemaker: Secondary | ICD-10-CM | POA: Diagnosis not present

## 2017-06-16 DIAGNOSIS — I429 Cardiomyopathy, unspecified: Secondary | ICD-10-CM | POA: Diagnosis not present

## 2017-06-16 DIAGNOSIS — T783XXA Angioneurotic edema, initial encounter: Secondary | ICD-10-CM | POA: Diagnosis not present

## 2017-06-16 DIAGNOSIS — Z87442 Personal history of urinary calculi: Secondary | ICD-10-CM | POA: Diagnosis not present

## 2017-06-16 DIAGNOSIS — I5022 Chronic systolic (congestive) heart failure: Secondary | ICD-10-CM | POA: Insufficient documentation

## 2017-06-16 DIAGNOSIS — Z95810 Presence of automatic (implantable) cardiac defibrillator: Secondary | ICD-10-CM | POA: Diagnosis not present

## 2017-06-16 DIAGNOSIS — Z7902 Long term (current) use of antithrombotics/antiplatelets: Secondary | ICD-10-CM | POA: Insufficient documentation

## 2017-06-16 DIAGNOSIS — X58XXXA Exposure to other specified factors, initial encounter: Secondary | ICD-10-CM | POA: Insufficient documentation

## 2017-06-16 DIAGNOSIS — Z87898 Personal history of other specified conditions: Secondary | ICD-10-CM

## 2017-06-16 LAB — ECHOCARDIOGRAM COMPLETE
AVLVOTPG: 2 mmHg
CHL CUP DOP CALC LVOT VTI: 17.8 cm
CHL CUP MV DEC (S): 239
E/e' ratio: 12.26
EWDT: 239 ms
FS: 17 % — AB (ref 28–44)
IV/PV OW: 0.81
LA vol A4C: 59.3 ml
LADIAMINDEX: 2.19 cm/m2
LASIZE: 45 mm
LEFT ATRIUM END SYS DIAM: 45 mm
LV E/e'average: 12.26
LV dias vol index: 75 mL/m2
LV dias vol: 155 mL — AB (ref 46–106)
LV e' LATERAL: 6.96 cm/s
LVEEMED: 12.26
LVOT area: 3.46 cm2
LVOT diameter: 21 mm
LVOT peak vel: 77 cm/s
LVOTSV: 62 mL
LVSYSVOL: 85 mL — AB (ref 14–42)
LVSYSVOLIN: 41 mL/m2
Lateral S' vel: 11.1 cm/s
MV Peak grad: 3 mmHg
MV pk A vel: 86.1 m/s
MVPKEVEL: 85.3 m/s
PW: 9.91 mm — AB (ref 0.6–1.1)
RV TAPSE: 26.7 mm
Simpson's disk: 46
Stroke v: 71 ml
TDI e' lateral: 6.96
TDI e' medial: 4.57

## 2017-06-16 LAB — BASIC METABOLIC PANEL
Anion gap: 4 — ABNORMAL LOW (ref 5–15)
BUN: 17 mg/dL (ref 6–20)
CALCIUM: 9.2 mg/dL (ref 8.9–10.3)
CHLORIDE: 108 mmol/L (ref 101–111)
CO2: 27 mmol/L (ref 22–32)
CREATININE: 1.47 mg/dL — AB (ref 0.44–1.00)
GFR calc non Af Amer: 39 mL/min — ABNORMAL LOW (ref >60)
GFR, EST AFRICAN AMERICAN: 45 mL/min — AB (ref >60)
Glucose, Bld: 106 mg/dL — ABNORMAL HIGH (ref 65–99)
Potassium: 3.2 mmol/L — ABNORMAL LOW (ref 3.5–5.1)
Sodium: 139 mmol/L (ref 135–145)

## 2017-06-16 LAB — CBC
HCT: 31.9 % — ABNORMAL LOW (ref 36.0–46.0)
Hemoglobin: 10.5 g/dL — ABNORMAL LOW (ref 12.0–15.0)
MCH: 29.5 pg (ref 26.0–34.0)
MCHC: 32.9 g/dL (ref 30.0–36.0)
MCV: 89.6 fL (ref 78.0–100.0)
PLATELETS: 151 10*3/uL (ref 150–400)
RBC: 3.56 MIL/uL — AB (ref 3.87–5.11)
RDW: 14.9 % (ref 11.5–15.5)
WBC: 4.9 10*3/uL (ref 4.0–10.5)

## 2017-06-16 MED ORDER — CARVEDILOL 25 MG PO TABS: 25.0000 mg | ORAL_TABLET | Freq: Two times a day (BID) | ORAL | 3 refills | Status: DC

## 2017-06-16 NOTE — Progress Notes (Signed)
  Echocardiogram 2D Echocardiogram has been performed.  Anita Warren 06/16/2017, 11:45 AM

## 2017-06-16 NOTE — Patient Instructions (Signed)
Change Carvedilol 25 mg (1 tab) 2 times daily    Labs drawn  Your physician recommends that you schedule a follow-up appointment in: 4 months

## 2017-06-17 NOTE — Progress Notes (Signed)
Patient ID: Anita Warren, female   DOB: 12-Jul-1961, 56 y.o.   MRN: [id number] Primary Physician: Anita Warren., MD  HF Cardiology: Dr. Aundra Warren EP: Dr Anita Warren  Urology: Dr Anita Warren  HPI: [name] is a 56 yo female with history of viral, non-ischemic cardiomyopathy, severe systolic dysfunction with EF of 10% s/p ICD pacemaker in the setting of VT 02/2011 ([employer]);  cath 2012 with no CAD.   Admitted 7/15 with acute on chronic systolic CHF she was not taking medications for 4 months because she lost her insurance. She was diuresed with IV lasix and transitioned to lasix 40 mg daily. She was also discharged on 12.5 mg carvedilol twice a day, hydralazine 25 mg tid, and 30 mg Imdur daily. Discharge weight was 176 pounds. She was not on an ACEI due to angioedema.  Admitted from HF clinic 06/02/14 with low output heart failure. Swan placed and showed cardiogenic shock. Ultimately discharged on  Milrinone 0.25 mcg/kg/min. Hospital stay was complicated by atrial flutter. Loaded on amiodarone and started on eliquis 5 mg twice a day. Met with Dr Anita Warren and VAD coordinator. She was titrated off the milrinone.  Echo in 8/17 showed EF up to 50%.    Today she returns for HF follow up. She is no longer on ACEI or ARB because of recurrent angioedema. Weight is down 17 lbs.  She is no longer taking amiodarone.  She is in NSR today.  She is walking daily for exercise for 15-20 minutes, no dyspnea.  Occasionally tries to jog, some dyspnea with jogging.  No orthopnea/PND.  No chest pain.  No palpitations.  Echo was done today and reviewed, EF is around 45% with normal RV size and systolic function.   Corevue reviewed today: Stable impedance, no volume overload.   05/23/14 ECHO EF 5-10% RV mod to severely dilated.  08/09/14 ECHO EF 20-25% RV read as normal 8/17 ECHO EF 50%, mild LV dilation, normal RV size and systolic function.  8/18 ECHO EF 45%, diffuse HK worse inferiorly, normal RV size and systolic  function.   Labs:  HIV negative 2015  9/15: K 3.6 => 4.6, creatinine 0.99 => 1.5 => 1.2, Mg 1.9, HCT 45.2, LFTs normal, TSH normal 9/15: PFTs normal 07/31/14: Co-ox off milrinone 66%  K 4.5 Cr 1.36 08/23/14: k 4.4, cr 1.51 digoxin 0.7 09/20/14: K 4.9, creatinine 1.64  12/15: K 4.4, creatinine 1.36 2/16: K 4.2, creatinine 1.67, HCT 33.9 04/28/2016: K 4.1 Creatinine 2.67  8/17: TSH normal, LFTs normal 9/17: K 3.5, creatinine 1.67, hgb 9.9 5/18: hgb 10.7, K 3.8, creatinine 1.45  ECG (personally reviewed): NSR, LVH with repolarization abnormality  2013 Colonoscopy Blood Type:  B+  Lower Extremity Doppler - Normal arterial flow Carotid Doppler-  1-39% stenosis involving the right internal carotid artery and the left internal carotid Artery.  SH: Disabled  Lives with husband and son. Has 3 grown children. Does not smoke or drink alcohol.. 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    FH: Mom died at 86 CAD         Sister and Brother HTN   ROS: All systems negative except as listed in HPI, PMH and Problem List.  Past Medical History:  Diagnosis Date  . AICD (automatic cardioverter/defibrillator) present    [employer] AICD implanted 03-07-2011 Dr. Allred/-Dr. Aundra Warren now follows  . CHF (congestive heart failure) (Anita Warren)    meds controlling, no episodes since 2014  . Chronic systolic heart failure ([place])   . Essential  hypertension, benign   . Headache   . History of kidney stones    multiple kidney stones in past  . History of medication noncompliance   . Hydronephrosis with renal and ureteral calculus obstruction 09/05/2013  . Nonischemic cardiomyopathy ([facility])    LVEF 5-10%, likely viral (no CAD by cath 01/30/11)  . NSVT (nonsustained ventricular tachycardia) ([place])   . Obesity     Current Outpatient Prescriptions  Medication Sig Dispense Refill  . acetaminophen (TYLENOL) 500 MG tablet Take 1,000 mg by mouth every 6 (six) hours as needed for moderate pain.    Marland Kitchen apixaban (ELIQUIS) 5 MG TABS tablet  Take 1 tablet (5 mg total) by mouth 2 (two) times daily. 180 tablet 3  . carvedilol (COREG) 25 MG tablet Take 1 tablet (25 mg total) by mouth 2 (two) times daily. 60 tablet 3  . Coenzyme Q10 (CO Q 10) 100 MG CAPS Take 100 mg by mouth daily.     . furosemide (LASIX) 40 MG tablet Take 1 tablet (40 mg total) by mouth 2 (two) times a week. Every Mon and Fri 45 tablet 3  . hydrALAZINE (APRESOLINE) 100 MG tablet Take 1 tablet (100 mg total) by mouth 3 (three) times daily. 90 tablet 2  . isosorbide mononitrate (IMDUR) 30 MG 24 hr tablet Take 3 tablets (90 mg total) by mouth daily. 90 tablet 6  . Magnesium Oxide 400 (240 MG) MG TABS Take 1 tablet by mouth daily. 90 tablet 3  . metoCLOPramide (REGLAN) 10 MG tablet Take 1 tablet (10 mg total) by mouth every 6 (six) hours as needed for nausea (or headache). 6 tablet 0  . potassium chloride SA (K-DUR,KLOR-CON) 20 MEQ tablet Take 2 tablets (40 mEq total) by mouth 2 (two) times a week. Mondays and fridays with lasix 8 tablet 0  . spironolactone (ALDACTONE) 25 MG tablet Take 1 tablet (25 mg total) by mouth daily. 90 tablet 2   No current facility-administered medications for this encounter.      PHYSICAL EXAM: BP 130/72   Pulse 63   Wt 181 lb 8 oz (82.3 kg)   LMP 10/28/1999   SpO2 96%   BMI 31.15 kg/m  General: NAD Neck: No JVD, no thyromegaly or thyroid nodule.  Lungs: Clear to auscultation bilaterally with normal respiratory effort. CV: Nondisplaced PMI.  Heart regular S1/S2, no S3/S4, no murmur.  No peripheral edema.  No carotid bruit.  Normal pedal pulses.  Abdomen: Soft, nontender, no hepatosplenomegaly, no distention.  Skin: Intact without lesions or rashes.  Neurologic: Alert and oriented x 3.  Psych: Normal affect. Extremities: No clubbing or cyanosis.  HEENT: Normal.   ASSESSMENT & PLAN: 1. Chronic Systolic Heart Failure: Nonischemic cardiomyopathy thought to potentially be due to viral myocarditis noted initially in 2011. Cath 2012  normal coronaries.  St Jude ICD. Echo (7/15) with EF 5-10% and RV mod-severely dilated with moderately decreased systolic function. Repeat echo 10/15 with EF up to 20-25%. She was on milrinone briefly then stopped.  Echo in 8/17 with EF up to 50%. Repeat echo today was reviewed, EF remains 45% with normal RV. NYHA class II symptoms, she is not volume overloaded on exam.  - Continue Lasix 40 mg twice weekly. - Continue spironolactone 25 mg daily. BMET today.  - No ACEI/ARB/ARNI due to recurrent episodes of angioedema.  - Increase Coreg to 25 mg bid.   - Continue hydralazine 100 mg tid and Imdur 90 mg daily.   2. Atrial flutter: Paroxysmal,  NSR today.  - She is now off amiodarone.  If atrial flutter recurs, should have ablation.  - Continue Eliquis 5 mg bid. CBC today.  3. Angioedema: Had in past on ACEI, had another episode after ACEI stopped.  She is no longer on ACEI, ARNI, or ARB. ?Hereditary or acquired C1 inhibitor deficiency. No further problems with this recently.  4. CKD: Stage 3. Check BMET today.    Loralie Champagne 06/17/2017

## 2017-06-18 LAB — CUP PACEART REMOTE DEVICE CHECK
Battery Remaining Longevity: 56 mo
Battery Remaining Percentage: 49 %
Battery Voltage: 2.95 V
Date Time Interrogation Session: 20180725063008
HighPow Impedance: 36 Ohm
Implantable Lead Model: 7120
Implantable Pulse Generator Implant Date: 20120511
Lead Channel Impedance Value: 400 Ohm
Lead Channel Setting Pacing Pulse Width: 0.5 ms
MDC IDC LEAD IMPLANT DT: 20120511
MDC IDC LEAD LOCATION: 753860
MDC IDC MSMT LEADCHNL RV PACING THRESHOLD AMPLITUDE: 0.75 V
MDC IDC MSMT LEADCHNL RV PACING THRESHOLD PULSEWIDTH: 0.5 ms
MDC IDC MSMT LEADCHNL RV SENSING INTR AMPL: 11.7 mV
MDC IDC PG SERIAL: 634653
MDC IDC SET LEADCHNL RV PACING AMPLITUDE: 2.5 V
MDC IDC SET LEADCHNL RV SENSING SENSITIVITY: 0.5 mV
MDC IDC STAT BRADY RV PERCENT PACED: 1 %

## 2017-06-19 ENCOUNTER — Telehealth (HOSPITAL_COMMUNITY): Payer: Self-pay | Admitting: Pharmacist

## 2017-06-19 NOTE — Telephone Encounter (Signed)
[name] called stating that she just spoke with [employer] patient assistance who stated they will send her Eliquis expedited but she may not get any until early next week and she will take her last dose tonight. Will provide her with samples so that she does not go without it.   Medication Samples have been provided to the patient.  Drug name: Eliquis       Strength: 5 mg        Qty: 28  LOT: [id number]  Exp.Date: 10/20  Dosing instructions: Take 1 tablet twice daily   The patient has been instructed regarding the correct time, dose, and frequency of taking this medication, including desired effects and most common side effects.   Anita Warren 9:55 AM 06/19/2017

## 2017-06-22 ENCOUNTER — Telehealth: Payer: Self-pay | Admitting: Cardiology

## 2017-06-22 ENCOUNTER — Telehealth (HOSPITAL_COMMUNITY): Payer: Self-pay | Admitting: *Deleted

## 2017-06-22 ENCOUNTER — Ambulatory Visit (INDEPENDENT_AMBULATORY_CARE_PROVIDER_SITE_OTHER): Payer: Medicare Other

## 2017-06-22 DIAGNOSIS — I5022 Chronic systolic (congestive) heart failure: Secondary | ICD-10-CM | POA: Diagnosis not present

## 2017-06-22 DIAGNOSIS — Z95810 Presence of automatic (implantable) cardiac defibrillator: Secondary | ICD-10-CM | POA: Diagnosis not present

## 2017-06-22 MED ORDER — POTASSIUM CHLORIDE CRYS ER 20 MEQ PO TBCR: 20.0000 meq | EXTENDED_RELEASE_TABLET | Freq: Every day | ORAL | 3 refills | Status: DC

## 2017-06-22 NOTE — Progress Notes (Signed)
EPIC Encounter for ICM Monitoring  Patient Name: Anita Warren is a 56 y.o. female Date: 06/22/2017 Primary Care Physican: Redmond School, MD Primary Cardiologist: [name]  Electrophysiologist: Allred Dry Weight:unknown       Heart Failure questions reviewed, pt asymptomatic.   Thoracic impedance normal.  Prescribed dosage: Furosemide 40 mg 1 tablet two times a week, Monday and Friday. Potassium 20 mEq 1 tablet daily.  Labs: 06/16/2017 Creatinine 1.47, BUN 17, Potassium 3.2, Sodium 139, EGFR 39-45 03/26/2017 Creatinine 1.45, BUN 16, Potassium 3.8, Sodium 138, EGFR 40-46 07/16/2016 Creatinine 1.67, BUN 26, Potassium 3.5, Sodium 140, EGFR 34-39 06/23/2016 Creatinine 1.59, BUN 18, Potassium 3.9, Sodium 139, EGFR 36-41  04/28/2016 Creatinine 2.67, BUN 30, Potassium 4.1, Sodium 135, EGFR 19-22  04/25/2016 Creatinine 1.40, BUN 19, Potassium 3.5, Sodium 137, EGFR 42-48   Recommendations: No changes.   Encouraged to call for fluid symptoms.  Follow-up plan: ICM clinic phone appointment on 07/30/2017.  Copy of ICM check sent to Dr. Rayann Heman.   3 month ICM trend: 06/22/2017   1 Year ICM trend:     Rosalene Billings, RN 06/22/2017 2:38 PM

## 2017-06-22 NOTE — Telephone Encounter (Signed)
-----   Message from Larey Dresser, MD sent at 06/20/2017  8:22 PM EDT ----- Take KCl 20 mEq daily.  Repeat BMET 10 days.

## 2017-06-22 NOTE — Telephone Encounter (Signed)
Spoke with pt and reminded pt of remote transmission that is due today. Pt verbalized understanding.   

## 2017-06-25 ENCOUNTER — Other Ambulatory Visit: Payer: Self-pay | Admitting: Internal Medicine

## 2017-07-01 ENCOUNTER — Other Ambulatory Visit (HOSPITAL_COMMUNITY): Payer: Self-pay | Admitting: Cardiology

## 2017-07-01 ENCOUNTER — Ambulatory Visit (HOSPITAL_COMMUNITY)
Admission: RE | Admit: 2017-07-01 | Discharge: 2017-07-01 | Disposition: A | Discharge status: Home / Self Care | Payer: Medicare Other | Source: Ambulatory Visit | Attending: Internal Medicine | Admitting: Internal Medicine

## 2017-07-01 DIAGNOSIS — I5022 Chronic systolic (congestive) heart failure: Secondary | ICD-10-CM | POA: Diagnosis not present

## 2017-07-01 LAB — BASIC METABOLIC PANEL
ANION GAP: 7 (ref 5–15)
BUN: 15 mg/dL (ref 6–20)
CALCIUM: 9.3 mg/dL (ref 8.9–10.3)
CO2: 24 mmol/L (ref 22–32)
Chloride: 108 mmol/L (ref 101–111)
Creatinine, Ser: 1.53 mg/dL — ABNORMAL HIGH (ref 0.44–1.00)
GFR calc non Af Amer: 37 mL/min — ABNORMAL LOW (ref >60)
GFR, EST AFRICAN AMERICAN: 43 mL/min — AB (ref >60)
GLUCOSE: 100 mg/dL — AB (ref 65–99)
POTASSIUM: 4.4 mmol/L (ref 3.5–5.1)
Sodium: 139 mmol/L (ref 135–145)

## 2017-07-30 ENCOUNTER — Telehealth: Payer: Self-pay

## 2017-07-30 ENCOUNTER — Ambulatory Visit (INDEPENDENT_AMBULATORY_CARE_PROVIDER_SITE_OTHER): Payer: Medicare Other

## 2017-07-30 DIAGNOSIS — I5022 Chronic systolic (congestive) heart failure: Secondary | ICD-10-CM

## 2017-07-30 DIAGNOSIS — Z95810 Presence of automatic (implantable) cardiac defibrillator: Secondary | ICD-10-CM

## 2017-07-30 NOTE — Telephone Encounter (Signed)
Remote ICM transmission received.  Attempted call to patient and left message to return call. 

## 2017-07-30 NOTE — Progress Notes (Signed)
EPIC Encounter for ICM Monitoring  Patient Name: Anita Warren is a 56 y.o. female Date: 07/30/2017 Primary Care Physican: Redmond School, MD Primary Cardiologist: [name]  Electrophysiologist: Allred Dry Weight:unknown       Attempted call to patient and unable to reach.  Left message to return call.  Transmission reviewed.    Thoracic impedance abnormal suggesting fluid accumulation since 07/26/2017.  Prescribed dosage: Furosemide 40 mg 1 tablet two times a week, Monday and Friday. Potassium 20 mEq 1 tablet daily.  Labs: 07/01/2017 Creatinine 1.53, BUN 15, Potassium 4.4, Sodium 139, EGFR 37-43 06/16/2017 Creatinine 1.47, BUN 17, Potassium 3.2, Sodium 139, EGFR 39-45 03/26/2017 Creatinine 1.45, BUN 16, Potassium 3.8, Sodium 138, EGFR 40-46 07/16/2016 Creatinine 1.67, BUN 26, Potassium 3.5, Sodium 140, EGFR 34-39 06/23/2016 Creatinine 1.59, BUN 18, Potassium 3.9, Sodium 139, EGFR 36-41  04/28/2016 Creatinine 2.67, BUN 30, Potassium 4.1, Sodium 135, EGFR 19-22  04/25/2016 Creatinine 1.40, BUN 19, Potassium 3.5, Sodium 137, EGFR 42-48   Recommendations: NONE - Unable to reach patient   Follow-up plan: ICM clinic phone appointment on 08/07/2017 to recheck fluid levels.  Office appointment scheduled 10/13/2017 with Dr. [name].  Copy of ICM check sent to Dr. Rayann Heman and Dr. [name] for review and recommendations if needed.   3 month ICM trend: 07/30/2017   1 Year ICM trend:      Rosalene Billings, RN 07/30/2017 9:04 AM

## 2017-07-30 NOTE — Progress Notes (Signed)
Patient returned call.  Reviewed transmission. She is asymptomatic and stated she is feeling fine.  Weight is stable at 178 lbs.   Confirmed she is taking Furosemide 40 mg 1 tablet every Monday and Friday as prescribed and taking Potassium 20 mEq 1 tablet daily.   Recommendations: Instructed to limit salt intake.  Advised would send copy to Dr [name] and if any recommendations will call her back.  Will recheck fluid levels on 08/07/2017 and advised to call if she experiences any fluid symptoms before then.

## 2017-08-07 ENCOUNTER — Ambulatory Visit (INDEPENDENT_AMBULATORY_CARE_PROVIDER_SITE_OTHER): Payer: Self-pay

## 2017-08-07 ENCOUNTER — Telehealth: Payer: Self-pay | Admitting: Cardiology

## 2017-08-07 DIAGNOSIS — Z95810 Presence of automatic (implantable) cardiac defibrillator: Secondary | ICD-10-CM

## 2017-08-07 DIAGNOSIS — I5022 Chronic systolic (congestive) heart failure: Secondary | ICD-10-CM

## 2017-08-07 NOTE — Telephone Encounter (Signed)
Spoke with pt and reminded pt of remote transmission that is due today. Pt verbalized understanding.   

## 2017-08-07 NOTE — Progress Notes (Signed)
EPIC Encounter for ICM Monitoring  Patient Name: Anita Warren is a 56 y.o. female Date: 08/07/2017 Primary Care Physican: Redmond School, MD Primary Cardiologist: [name]  Electrophysiologist: Allred Dry Weight:179 lbs      Heart Failure questions reviewed, pt asymptomatic.   Thoracic impedance returned to baseline on 10/9 but trending abnormal again suggesting fluid accumulation 08/05/2017.  Impedance staring toward baseline today and she is due to take Furosemide today.    Prescribed dosage: Furosemide 40 mg 1 tablet two times a week, Monday and Friday. Potassium 20 mEq 1tablet daily.  Patient confirmed she is taking Furosemide twice a week.  Labs: 07/01/2017 Creatinine 1.53, BUN 15, Potassium 4.4, Sodium 139, EGFR 37-43 06/16/2017 Creatinine 1.47, BUN 17, Potassium 3.2, Sodium 139, EGFR 39-45 03/26/2017 Creatinine 1.45, BUN 16, Potassium 3.8, Sodium 138, EGFR 40-46 07/16/2016 Creatinine 1.67, BUN 26, Potassium 3.5, Sodium 140, EGFR 34-39 06/23/2016 Creatinine 1.59, BUN 18, Potassium 3.9, Sodium 139, EGFR 36-41  04/28/2016 Creatinine 2.67, BUN 30, Potassium 4.1, Sodium 135, EGFR 19-22  04/25/2016 Creatinine 1.40, BUN 19, Potassium 3.5, Sodium 137, EGFR 42-48   Recommendations: Patient has been eating out a lot including chinese food.  Discussed salt limitation and she is going to try and cut back on eating out.   Follow-up plan: ICM clinic phone appointment on 08/13/2017.  Office appointment scheduled 10/13/2017 with Dr. [name].  Copy of ICM check sent to Dr. Rayann Heman and Dr. [name] for review and recommendations if needed.   3 month ICM trend: 08/07/2017   1 Year ICM trend:      Rosalene Billings, RN 08/07/2017 1:07 PM

## 2017-08-09 NOTE — Progress Notes (Signed)
Would have her increase Lasix to three times a week.

## 2017-08-10 MED ORDER — FUROSEMIDE 40 MG PO TABS: ORAL_TABLET | ORAL | 3 refills | Status: DC

## 2017-08-10 NOTE — Progress Notes (Signed)
Call to patient.  Advised Dr [name] ordered her to take Lasix 40 mg 1 tablet 3 times a week and she verbalized understanding.  She said she has plenty of Furosemide on hand.

## 2017-08-10 NOTE — Addendum Note (Signed)
Addended by: Rosalene Billings on: 08/10/2017 02:45 PM   Modules accepted: Orders

## 2017-08-13 ENCOUNTER — Ambulatory Visit (INDEPENDENT_AMBULATORY_CARE_PROVIDER_SITE_OTHER): Payer: Self-pay

## 2017-08-13 DIAGNOSIS — I5022 Chronic systolic (congestive) heart failure: Secondary | ICD-10-CM

## 2017-08-13 DIAGNOSIS — Z95810 Presence of automatic (implantable) cardiac defibrillator: Secondary | ICD-10-CM

## 2017-08-13 NOTE — Progress Notes (Signed)
EPIC Encounter for ICM Monitoring  Patient Name: Anita Warren is a 56 y.o. female Date: 08/13/2017 Primary Care Physican: Redmond School, MD Primary Cardiologist: [name]  Electrophysiologist: Allred Dry Weight:173 lbs          Heart Failure questions reviewed, pt reported breathing has improved.   Thoracic impedance almost back to normal.  Prescribed dosage: Furosemide 40 mg 1 tablet three times a week, Monday, Wednesday and Friday.  Potassium 20 mEq 1tablet daily.    Labs: 07/01/2017 Creatinine 1.53, BUN 15, Potassium 4.4, Sodium 139, EGFR 37-43 06/16/2017 Creatinine 1.47, BUN 17, Potassium 3.2, Sodium 139, EGFR 39-45 03/26/2017 Creatinine 1.45, BUN 16, Potassium 3.8, Sodium 138, EGFR 40-46 07/16/2016 Creatinine 1.67, BUN 26, Potassium 3.5, Sodium 140, EGFR 34-39 06/23/2016 Creatinine 1.59, BUN 18, Potassium 3.9, Sodium 139, EGFR 36-41  04/28/2016 Creatinine 2.67, BUN 30, Potassium 4.1, Sodium 135, EGFR 19-22  04/25/2016 Creatinine 1.40, BUN 19, Potassium 3.5, Sodium 137, EGFR 42-48   Recommendations: No changes.  Advised to limit salt intake to 2000 mg/day and fluid intake to < 2 liters/day.  Encouraged to call for fluid symptoms.  Follow-up plan: ICM clinic phone appointment on 09/01/2017.  Office appointment scheduled 10/13/2017 with Dr. [name].  Copy of ICM check sent to Dr. Rayann Heman and Dr. [name].   3 month ICM trend: 08/13/2017   1 Year ICM trend:      Rosalene Billings, RN 08/13/2017 3:19 PM

## 2017-09-01 ENCOUNTER — Telehealth: Payer: Self-pay

## 2017-09-01 ENCOUNTER — Ambulatory Visit (INDEPENDENT_AMBULATORY_CARE_PROVIDER_SITE_OTHER): Payer: Medicare Other | Admitting: *Deleted

## 2017-09-01 ENCOUNTER — Telehealth: Payer: Self-pay | Admitting: Cardiology

## 2017-09-01 DIAGNOSIS — I5022 Chronic systolic (congestive) heart failure: Secondary | ICD-10-CM

## 2017-09-01 DIAGNOSIS — I428 Other cardiomyopathies: Secondary | ICD-10-CM

## 2017-09-01 DIAGNOSIS — Z95810 Presence of automatic (implantable) cardiac defibrillator: Secondary | ICD-10-CM | POA: Diagnosis not present

## 2017-09-01 NOTE — Telephone Encounter (Signed)
Remote ICM transmission received.  Attempted call to patient and left message to return call. 

## 2017-09-01 NOTE — Progress Notes (Signed)
EPIC Encounter for ICM Monitoring  Patient Name: Anita Warren is a 56 y.o. female Date: 09/01/2017 Primary Care Physican: Redmond School, MD Primary Cardiologist: [name]  Electrophysiologist: Allred Dry Weight:173 lbs         Spoke with patient and she stated she is feeling fine.  Denied any fluid symptoms.    Corvue: Thoracic impedance normal.  Prescribed dosage: Furosemide 40 mg 1 tablet three times a week, Monday, Wednesday and Friday.  Potassium 20 mEq 1tablet daily.   Labs: 07/01/2017 Creatinine 1.53, BUN 15, Potassium 4.4, Sodium 139, EGFR 37-43 06/16/2017 Creatinine 1.47, BUN 17, Potassium 3.2, Sodium 139, EGFR 39-45 03/26/2017 Creatinine 1.45, BUN 16, Potassium 3.8, Sodium 138, EGFR 40-46 07/16/2016 Creatinine 1.67, BUN 26, Potassium 3.5, Sodium 140, EGFR 34-39 06/23/2016 Creatinine 1.59, BUN 18, Potassium 3.9, Sodium 139, EGFR 36-41  04/28/2016 Creatinine 2.67, BUN 30, Potassium 4.1, Sodium 135, EGFR 19-22  04/25/2016 Creatinine 1.40, BUN 19, Potassium 3.5, Sodium 137, EGFR 42-48   Recommendations: No changes and encouraged to call for any fluid symptoms.    Follow-up plan: ICM clinic phone appointment on 10/05/2017.  Office appointment scheduled 10/13/2017 with Dr. [name].  Copy of ICM check sent to Dr. Rayann Heman.   3 month ICM trend: 09/01/2017    1 Year ICM trend:       Rosalene Billings, RN 09/01/2017 1:37 PM

## 2017-09-01 NOTE — Telephone Encounter (Signed)
LMOVM reminding pt to send remote transmission.   

## 2017-09-02 NOTE — Progress Notes (Signed)
Remote ICD transmission.   

## 2017-09-03 ENCOUNTER — Encounter: Payer: Self-pay | Admitting: Cardiology

## 2017-09-20 LAB — CUP PACEART REMOTE DEVICE CHECK
Battery Remaining Longevity: 54 mo
Brady Statistic RV Percent Paced: 1 %
Date Time Interrogation Session: 20181106170307
HighPow Impedance: 37 Ohm
Implantable Lead Implant Date: 20120511
Implantable Lead Location: 753860
Implantable Lead Model: 7120
Lead Channel Pacing Threshold Pulse Width: 0.5 ms
Lead Channel Sensing Intrinsic Amplitude: 11.7 mV
Lead Channel Setting Sensing Sensitivity: 0.5 mV
MDC IDC MSMT BATTERY REMAINING PERCENTAGE: 46 %
MDC IDC MSMT BATTERY VOLTAGE: 2.93 V
MDC IDC MSMT LEADCHNL RV IMPEDANCE VALUE: 400 Ohm
MDC IDC MSMT LEADCHNL RV PACING THRESHOLD AMPLITUDE: 0.75 V
MDC IDC PG IMPLANT DT: 20120511
MDC IDC SET LEADCHNL RV PACING AMPLITUDE: 2.5 V
MDC IDC SET LEADCHNL RV PACING PULSEWIDTH: 0.5 ms
Pulse Gen Serial Number: 634653

## 2017-09-22 ENCOUNTER — Other Ambulatory Visit (HOSPITAL_COMMUNITY): Payer: Self-pay | Admitting: Internal Medicine

## 2017-10-05 ENCOUNTER — Ambulatory Visit (INDEPENDENT_AMBULATORY_CARE_PROVIDER_SITE_OTHER): Payer: Medicare Other

## 2017-10-05 DIAGNOSIS — Z95810 Presence of automatic (implantable) cardiac defibrillator: Secondary | ICD-10-CM

## 2017-10-05 DIAGNOSIS — I5022 Chronic systolic (congestive) heart failure: Secondary | ICD-10-CM

## 2017-10-09 NOTE — Progress Notes (Signed)
EPIC Encounter for ICM Monitoring  Patient Name: Anita Warren is a 56 y.o. female Date: 10/09/2017 Primary Care Physican: Redmond School, MD Primary Cardiologist: [name]  Electrophysiologist: Allred Dry Weight:Previous weight 173lbs                                                              Transmission reviewed.   Corvue: Thoracic impedance normal.  Prescribed dosage: Furosemide 40 mg 1 tabletthreetimes a week, Monday, Wednesdayand Friday. Potassium 20 mEq 1tablet daily.   Labs: 07/01/2017 Creatinine 1.53, BUN 15, Potassium 4.4, Sodium 139, EGFR 37-43 06/16/2017 Creatinine 1.47, BUN 17, Potassium 3.2, Sodium 139, EGFR 39-45 03/26/2017 Creatinine 1.45, BUN 16, Potassium 3.8, Sodium 138, EGFR 40-46 07/16/2016 Creatinine 1.67, BUN 26, Potassium 3.5, Sodium 140, EGFR 34-39 06/23/2016 Creatinine 1.59, BUN 18, Potassium 3.9, Sodium 139, EGFR 36-41  04/28/2016 Creatinine 2.67, BUN 30, Potassium 4.1, Sodium 135, EGFR 19-22  04/25/2016 Creatinine 1.40, BUN 19, Potassium 3.5, Sodium 137, EGFR 42-48  Recommendations: No changes.  Follow-up plan: ICM clinic phone appointment on 11/05/2017.  Office appointment scheduled 10/13/2017 with Dr. [name].  Copy of ICM check sent to Dr. Rayann Heman.   3 month ICM trend: 10/05/2017    1 Year ICM trend:       Rosalene Billings, RN 10/09/2017 3:06 PM

## 2017-10-12 ENCOUNTER — Encounter (HOSPITAL_COMMUNITY): Payer: Self-pay | Admitting: *Deleted

## 2017-10-12 ENCOUNTER — Emergency Department (HOSPITAL_COMMUNITY): Payer: Medicare Other

## 2017-10-12 ENCOUNTER — Emergency Department (HOSPITAL_COMMUNITY)
Admission: EM | Admit: 2017-10-12 | Discharge: 2017-10-12 | Disposition: A | Discharge status: Home / Self Care | Payer: Medicare Other | Attending: Emergency Medicine | Admitting: Emergency Medicine

## 2017-10-12 DIAGNOSIS — Y939 Activity, unspecified: Secondary | ICD-10-CM | POA: Diagnosis not present

## 2017-10-12 DIAGNOSIS — Z91010 Allergy to peanuts: Secondary | ICD-10-CM | POA: Diagnosis not present

## 2017-10-12 DIAGNOSIS — S93402A Sprain of unspecified ligament of left ankle, initial encounter: Secondary | ICD-10-CM | POA: Diagnosis not present

## 2017-10-12 DIAGNOSIS — E1122 Type 2 diabetes mellitus with diabetic chronic kidney disease: Secondary | ICD-10-CM | POA: Insufficient documentation

## 2017-10-12 DIAGNOSIS — Y929 Unspecified place or not applicable: Secondary | ICD-10-CM | POA: Insufficient documentation

## 2017-10-12 DIAGNOSIS — N183 Chronic kidney disease, stage 3 (moderate): Secondary | ICD-10-CM | POA: Diagnosis not present

## 2017-10-12 DIAGNOSIS — M25572 Pain in left ankle and joints of left foot: Secondary | ICD-10-CM | POA: Diagnosis not present

## 2017-10-12 DIAGNOSIS — X509XXA Other and unspecified overexertion or strenuous movements or postures, initial encounter: Secondary | ICD-10-CM | POA: Insufficient documentation

## 2017-10-12 DIAGNOSIS — Z79899 Other long term (current) drug therapy: Secondary | ICD-10-CM | POA: Insufficient documentation

## 2017-10-12 DIAGNOSIS — I5021 Acute systolic (congestive) heart failure: Secondary | ICD-10-CM | POA: Diagnosis not present

## 2017-10-12 DIAGNOSIS — W19XXXA Unspecified fall, initial encounter: Secondary | ICD-10-CM

## 2017-10-12 DIAGNOSIS — S8992XA Unspecified injury of left lower leg, initial encounter: Secondary | ICD-10-CM | POA: Diagnosis not present

## 2017-10-12 DIAGNOSIS — I130 Hypertensive heart and chronic kidney disease with heart failure and stage 1 through stage 4 chronic kidney disease, or unspecified chronic kidney disease: Secondary | ICD-10-CM | POA: Diagnosis not present

## 2017-10-12 DIAGNOSIS — S99912A Unspecified injury of left ankle, initial encounter: Secondary | ICD-10-CM | POA: Diagnosis not present

## 2017-10-12 DIAGNOSIS — Y999 Unspecified external cause status: Secondary | ICD-10-CM | POA: Diagnosis not present

## 2017-10-12 DIAGNOSIS — M79672 Pain in left foot: Secondary | ICD-10-CM | POA: Diagnosis not present

## 2017-10-12 DIAGNOSIS — M7989 Other specified soft tissue disorders: Secondary | ICD-10-CM | POA: Diagnosis not present

## 2017-10-12 MED ORDER — ONDANSETRON HCL 4 MG PO TABS
4.0000 mg | ORAL_TABLET | Freq: Once | ORAL | Status: AC
  Administered 2017-10-12: 4 mg via ORAL
  Filled 2017-10-12: qty 1

## 2017-10-12 MED ORDER — HYDROCODONE-ACETAMINOPHEN 5-325 MG PO TABS: 1.0000 | ORAL_TABLET | ORAL | 0 refills | Status: DC | PRN

## 2017-10-12 MED ORDER — HYDROCODONE-ACETAMINOPHEN 5-325 MG PO TABS
2.0000 | ORAL_TABLET | Freq: Once | ORAL | Status: AC
  Administered 2017-10-12: 2 via ORAL
  Filled 2017-10-12: qty 2

## 2017-10-12 NOTE — ED Notes (Signed)
EDP at bedside  

## 2017-10-12 NOTE — ED Triage Notes (Signed)
Pt states she slipped on a perfume bottle and fell forward. She c/o pain in the left ankle. She applied ace wrap and ice PTA. No meds PTA.

## 2017-10-12 NOTE — Discharge Instructions (Signed)
Your x-rays are negative for fracture or dislocation.  Please keep your foot elevated above your waist.  Please apply an ice pack on for 20 minutes and then off for 20 minutes.  Please monitor your ankle for swelling.  Please return immediately if any problems with your toes or with your left lower extremity.  Please use Tylenol for mild pain.  Use Norco for more severe pain. This medication may cause drowsiness. Please do not drink, drive, or participate in activity that requires concentration while taking this medication.

## 2017-10-12 NOTE — ED Provider Notes (Signed)
[facility] EMERGENCY DEPARTMENT Provider Note   CSN: [id number] Arrival date & time: 10/12/17  2145     History   Chief Complaint Chief Complaint  Patient presents with  . Fall    HPI Anita Warren is a 56 y.o. female.  The patient is a 56 year old female who presents to the emergency department with a complaint of left foot pain.  The patient states that 2 hours prior to her arrival to the emergency department she fell forward.  She injured the left foot and ankle area.  She states now she is having excruciating pain particularly along fifth toe and the side of her foot.  It is of note that she is on Eliquis.  No other injuries reported at this time.  Patient denies hitting her head.   The history is provided by the patient.  Fall  Pertinent negatives include no chest pain, no abdominal pain and no shortness of breath.    Past Medical History:  Diagnosis Date  . AICD (automatic cardioverter/defibrillator) present    St. Jude AICD implanted 03-07-2011 Dr. Allred/-Dr. [name] now follows  . CHF (congestive heart failure) ([place])    meds controlling, no episodes since 2014  . Chronic systolic heart failure ([place])   . Essential hypertension, benign   . Headache   . History of kidney stones    multiple kidney stones in past  . History of medication noncompliance   . Hydronephrosis with renal and ureteral calculus obstruction 09/05/2013  . Nonischemic cardiomyopathy (HCC)    LVEF 5-10%, likely viral (no CAD by cath 01/30/11)  . NSVT (nonsustained ventricular tachycardia) ([place])   . Obesity     Patient Active Problem List   Diagnosis Date Noted  . Ureteral calculus 04/28/2016  . Paroxysmal atrial fibrillation ([place]) 12/07/2014  . Hx of angioedema 10/25/2014  . CKD (chronic kidney disease) stage 3, GFR 30-59 ml/min (HCC) 09/20/2014  . Chronic systolic heart failure ([place]) 06/14/2014  . Atrial flutter ([place]) 06/06/2014  . Palliative care encounter 06/06/2014  . CHF  exacerbation ([place]) 05/23/2014  . Acute on chronic renal failure ([place]) 05/23/2014  . Noncompliance with medication regimen 05/23/2014  . SOB (shortness of breath) 05/23/2014  . Hyperbilirubinemia 10/29/2013  . Acute systolic CHF (congestive heart failure) ([place]) 10/29/2013  . H/O viral gastroenteritis 10/28/2013  . Acute systolic CHF (congestive heart failure), NYHA class 3 ([place]) 10/28/2013  . Acute renal insufficiency 09/07/2013  . Pyelonephritis, acute 09/05/2013  . Hydronephrosis with renal and ureteral calculus obstruction 09/05/2013  . Acute on chronic systolic heart failure ([place]) 07/04/2013  . Encounter for screening colonoscopy 06/01/2012  . Obesity 06/27/2011  . HYPERTENSION 10/18/2010  . 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 ABDOMINAL HYSTERECTOMY    . BREAST LUMPECTOMY  1989   L breast- benign  . CARDIAC DEFIBRILLATOR PLACEMENT  5/12   SJM by JA  . CESAREAN SECTION     x 2  . CHOLECYSTECTOMY    . COLONOSCOPY  07/02/2012   Procedure: COLONOSCOPY;  Surgeon: [name], MD;  Location: [facility] END[facility] SUITE;  Service: Endoscopy;  Laterality: N/A;  1:15/PATIENT HAS A DEFIBRILLATOR  . CYSTOSCOPY W/ URETERAL STENT PLACEMENT Right 09/06/2013   Procedure: CYSTOSCOPY WITH RIGHT RETROGRADE PYELOGRAM; RIGHT URETERAL STENT PLACEMENT;  Surgeon: [name], MD;  Location: [facility] ORS;  Service: Urology;  Laterality: Right;  . CYSTOSCOPY W/ URETERAL STENT PLACEMENT N/A 04/28/2016   Procedure: CYSTOSCOPY WITH  RIGHT RETROGRADE PYELOGRAM/RIGHT URETERAL STENT PLACEMENT;  Surgeon: Rana Snare, MD;  Location: [facility] ORS;  Service: Urology;  Laterality: N/A;  . CYSTOSCOPY W/ URETERAL STENT REMOVAL Right 07/21/2016   Procedure: CYSTOSCOPY WITH STENT REMOVAL;  Surgeon: Franchot Gallo, MD;  Location: [facility] ORS;  Service: Urology;  Laterality: Right;  . CYSTOSCOPY/URETEROSCOPY/HOLMIUM LASER/STENT PLACEMENT Right 07/21/2016   Procedure:  CYSTOSCOPY/URETEROSCOPY/HOLMIUM LASER/   right retrograde pylegram;  Surgeon: Franchot Gallo, MD;  Location: [facility] ORS;  Service: Urology;  Laterality: Right;  . TONSILLECTOMY    . TUBAL LIGATION      OB History    Gravida Para Term Preterm AB Living             3   SAB TAB Ectopic Multiple Live Births                   Home Medications    Prior to Admission medications   Medication Sig Start Date End Date Taking? Authorizing Provider  acetaminophen (TYLENOL) 500 MG tablet Take 1,000 mg by mouth every 6 (six) hours as needed for moderate pain.    [provider]  apixaban (ELIQUIS) 5 MG TABS tablet Take 1 tablet (5 mg total) by mouth 2 (two) times daily. 03/31/17   Bensimhon, Shaune Pascal, MD  carvedilol (COREG) 25 MG tablet Take 1 tablet (25 mg total) by mouth 2 (two) times daily. 06/16/17   Larey Dresser, MD  Coenzyme Q10 (CO Q 10) 100 MG CAPS Take 100 mg by mouth daily.     [provider]  furosemide (LASIX) 40 MG tablet Take 1 tablet (40 mg total) 3 times a week, every Monday, Wednesday and Friday. 08/10/17   Larey Dresser, MD  hydrALAZINE (APRESOLINE) 100 MG tablet TAKE 1 TABLET BY MOUTH THREE TIMES DAILY 07/02/17   Bensimhon, Shaune Pascal, MD  isosorbide mononitrate (IMDUR) 30 MG 24 hr tablet TAKE 3 TABLETS BY MOUTH DAILY 09/23/17   Bensimhon, Shaune Pascal, MD  Magnesium Oxide 400 (240 MG) MG TABS Take 1 tablet by mouth daily. 08/10/14   Bensimhon, Shaune Pascal, MD  metoCLOPramide (REGLAN) 10 MG tablet Take 1 tablet (10 mg total) by mouth every 6 (six) hours as needed for nausea (or headache). 03/07/17   Francine Graven, DO  potassium chloride SA (K-DUR,KLOR-CON) 20 MEQ tablet Take 1 tablet (20 mEq total) by mouth daily. 06/22/17   Larey Dresser, MD  spironolactone (ALDACTONE) 25 MG tablet Take 1 tablet (25 mg total) by mouth daily. 02/18/17   Thompson Grayer, MD    Family History Family History  Problem Relation Age of Onset  . Hypertension Unknown   . Diabetes Unknown     . Colon cancer Neg Hx     Social History Social History   Tobacco Use  . Smoking status: Never Smoker  . Smokeless tobacco: Never Used  Substance Use Topics  . Alcohol use: No  . Drug use: No     Allergies   Peanut-containing drug products; Ace inhibitors; Morphine; and Demerol   Review of Systems Review of Systems  Constitutional: Negative for activity change.       All ROS Neg except as noted in HPI  HENT: Negative for nosebleeds.   Eyes: Negative for photophobia and discharge.  Respiratory: Negative for cough, shortness of breath and wheezing.   Cardiovascular: Negative for chest pain and palpitations.  Gastrointestinal: Negative for abdominal pain and blood in stool.  Genitourinary: Negative for dysuria, frequency and hematuria.  Musculoskeletal: Positive  for arthralgias. Negative for back pain and neck pain.  Skin: Negative.   Neurological: Negative for dizziness, seizures and speech difficulty.  Psychiatric/Behavioral: Negative for confusion and hallucinations.     Physical Exam Updated Vital Signs BP 140/90 (BP Location: Right Arm)   Pulse 74   Temp 98.7 F (37.1 C) (Oral)   Resp 18   Ht 5\' 4"  (1.626 m)   Wt 80.7 kg (178 lb)   LMP 10/28/1999   SpO2 100%   BMI 30.55 kg/m   Physical Exam  Constitutional: She is oriented to person, place, and time. She appears well-developed and well-nourished.  Non-toxic appearance.  HENT:  Head: Normocephalic.  Right Ear: Tympanic membrane and external ear normal.  Left Ear: Tympanic membrane and external ear normal.  Eyes: EOM and lids are normal. Pupils are equal, round, and reactive to light.  Neck: Normal range of motion. Neck supple. Carotid bruit is not present.  Cardiovascular: Normal rate, regular rhythm, normal heart sounds, intact distal pulses and normal pulses.  Pulmonary/Chest: Breath sounds normal. No respiratory distress.  Abdominal: Soft. Bowel sounds are normal. There is no tenderness. There is no  guarding.  Musculoskeletal: Normal range of motion.  There is full range of motion of right and left upper extremities.  There is good range of motion of the left knee.  There is pain to the lateral aspect of the left foot.  This extends to the lateral malleolus.  The radial pulses 2+.  The dorsalis pedis pulses 2+.  The patient has pain with movement of the toes, but can move them.  Lymphadenopathy:       Head (right side): No submandibular adenopathy present.       Head (left side): No submandibular adenopathy present.    She has no cervical adenopathy.  Neurological: She is alert and oriented to person, place, and time. She has normal strength. No cranial nerve deficit or sensory deficit.  No sensory deficit noted of the left lower extremity.  Patient is too uncomfortable at this time to complete motor exam.  Skin: Skin is warm and dry.  Psychiatric: She has a normal mood and affect. Her speech is normal.  Nursing note and vitals reviewed.    ED Treatments / Results  Labs (all labs ordered are listed, but only abnormal results are displayed) Labs Reviewed - No data to display  EKG  EKG Interpretation None       Radiology No results found.  Procedures Procedures (including critical care time)  Medications Ordered in ED Medications  HYDROcodone-acetaminophen (NORCO/VICODIN) 5-325 MG per tablet 2 tablet (not administered)  ondansetron (ZOFRAN) tablet 4 mg (not administered)     Initial Impression / Assessment and Plan / ED Course  I have reviewed the triage vital signs and the nursing notes.  Pertinent labs & imaging results that were available during my care of the patient were reviewed by me and considered in my medical decision making (see chart for details).       Final Clinical Impressions(s) / ED Diagnoses MDM Patient complains of severe pain of the left ankle, mostly at the lateral aspect of the foot and ankle.  Patient supplied with an ice pack.  Patient  also treated with hydrocodone for pain.  No gross neurologic deficit appreciated.  No gross neurovascular deficit.  No other injury noted on examination.  X-ray of the left ankle is negative for fracture or dislocation.  X-ray of the left foot is negative for fracture or dislocation.  Patient fitted with an ankle stirrup splint and fitted with crutches.  The patient is to follow-up with orthopedics if not improving.  The patient will return to the emergency department immediately if any unusual swelling or other changes in her left lower extremity as she is on Eliquis.  The patient acknowledges understanding of my instructions and is in agreement with this plan.   Final diagnoses:  Sprain of left ankle, unspecified ligament, initial encounter  Fall, initial encounter    ED Discharge Orders        Ordered    HYDROcodone-acetaminophen (NORCO/VICODIN) 5-325 MG tablet  Every 4 hours PRN     10/12/17 2311    HYDROcodone-acetaminophen (NORCO/VICODIN) 5-325 MG tablet  Every 4 hours PRN     10/12/17 2311       Lily Kocher, PA-C 10/12/17 2318    Forde Dandy, MD 10/13/17 [phone]

## 2017-10-13 ENCOUNTER — Encounter (HOSPITAL_COMMUNITY): Payer: Medicare Other | Admitting: Cardiology

## 2017-10-13 MED FILL — Hydrocodone-Acetaminophen Tab 5-325 MG: ORAL | Qty: 6 | Status: AC

## 2017-10-14 ENCOUNTER — Other Ambulatory Visit: Payer: Self-pay

## 2017-10-21 DIAGNOSIS — I429 Cardiomyopathy, unspecified: Secondary | ICD-10-CM | POA: Diagnosis not present

## 2017-10-21 DIAGNOSIS — Z1389 Encounter for screening for other disorder: Secondary | ICD-10-CM | POA: Diagnosis not present

## 2017-10-21 DIAGNOSIS — G43909 Migraine, unspecified, not intractable, without status migrainosus: Secondary | ICD-10-CM | POA: Diagnosis not present

## 2017-10-21 DIAGNOSIS — Z0001 Encounter for general adult medical examination with abnormal findings: Secondary | ICD-10-CM | POA: Diagnosis not present

## 2017-10-21 DIAGNOSIS — I10 Essential (primary) hypertension: Secondary | ICD-10-CM | POA: Diagnosis not present

## 2017-10-21 DIAGNOSIS — Z23 Encounter for immunization: Secondary | ICD-10-CM | POA: Diagnosis not present

## 2017-10-25 ENCOUNTER — Other Ambulatory Visit (HOSPITAL_COMMUNITY): Payer: Self-pay | Admitting: Internal Medicine

## 2017-10-25 ENCOUNTER — Other Ambulatory Visit (HOSPITAL_COMMUNITY): Payer: Self-pay | Admitting: Cardiology

## 2017-10-28 ENCOUNTER — Other Ambulatory Visit (HOSPITAL_COMMUNITY): Payer: Self-pay | Admitting: *Deleted

## 2017-10-28 MED ORDER — CARVEDILOL 25 MG PO TABS: 25.0000 mg | ORAL_TABLET | Freq: Two times a day (BID) | ORAL | 3 refills | Status: DC

## 2017-10-28 MED ORDER — HYDRALAZINE HCL 100 MG PO TABS: 100.0000 mg | ORAL_TABLET | Freq: Three times a day (TID) | ORAL | 2 refills | Status: DC

## 2017-11-05 ENCOUNTER — Ambulatory Visit (INDEPENDENT_AMBULATORY_CARE_PROVIDER_SITE_OTHER): Payer: Medicare Other

## 2017-11-05 DIAGNOSIS — Z95810 Presence of automatic (implantable) cardiac defibrillator: Secondary | ICD-10-CM

## 2017-11-05 DIAGNOSIS — I5022 Chronic systolic (congestive) heart failure: Secondary | ICD-10-CM | POA: Diagnosis not present

## 2017-11-06 NOTE — Progress Notes (Signed)
EPIC Encounter for ICM Monitoring  Patient Name: Anita Warren is a 57 y.o. female Date: 11/06/2017 Primary Care Physican: Redmond School, MD Primary Cardiologist: [name]  Electrophysiologist: Allred Dry Weight:173lbs       Heart Failure questions reviewed, pt asymptomatic.   Thoracic impedance normal but is above baseline suggesting dryness.  Prescribed dosage: Furosemide 40 mg 1 tabletthreetimes a week, Monday, Wednesdayand Friday. Potassium 20 mEq 1tablet daily.   Labs: 07/01/2017 Creatinine 1.53, BUN 15, Potassium 4.4, Sodium 139, EGFR 37-43 06/16/2017 Creatinine 1.47, BUN 17, Potassium 3.2, Sodium 139, EGFR 39-45 03/26/2017 Creatinine 1.45, BUN 16, Potassium 3.8, Sodium 138, EGFR 40-46 07/16/2016 Creatinine 1.67, BUN 26, Potassium 3.5, Sodium 140, EGFR 34-39 06/23/2016 Creatinine 1.59, BUN 18, Potassium 3.9, Sodium 139, EGFR 36-41  04/28/2016 Creatinine 2.67, BUN 30, Potassium 4.1, Sodium 135, EGFR 19-22  04/25/2016 Creatinine 1.40, BUN 19, Potassium 3.5, Sodium 137, EGFR 42-48  Recommendations: No changes.   Encouraged to call for fluid symptoms.  Follow-up plan: ICM clinic phone appointment on 12/08/2017.    Copy of ICM check sent to Dr. Rayann Heman.   3 month ICM trend: 11/05/2017    1 Year ICM trend:       Rosalene Billings, RN 11/06/2017 9:38 AM

## 2017-11-18 ENCOUNTER — Other Ambulatory Visit: Payer: Self-pay | Admitting: Internal Medicine

## 2017-11-24 ENCOUNTER — Ambulatory Visit: Payer: Medicare Other | Admitting: General Surgery

## 2017-11-25 ENCOUNTER — Encounter (HOSPITAL_COMMUNITY): Payer: Self-pay | Admitting: Cardiology

## 2017-11-25 ENCOUNTER — Ambulatory Visit (HOSPITAL_COMMUNITY)
Admission: RE | Admit: 2017-11-25 | Discharge: 2017-11-25 | Disposition: A | Discharge status: Home / Self Care | Payer: Medicare Other | Source: Ambulatory Visit | Attending: Cardiology | Admitting: Cardiology

## 2017-11-25 VITALS — BP 124/78 | HR 65 | Wt 177.8 lb

## 2017-11-25 DIAGNOSIS — T783XXA Angioneurotic edema, initial encounter: Secondary | ICD-10-CM | POA: Diagnosis not present

## 2017-11-25 DIAGNOSIS — Z7901 Long term (current) use of anticoagulants: Secondary | ICD-10-CM | POA: Diagnosis not present

## 2017-11-25 DIAGNOSIS — I4892 Unspecified atrial flutter: Secondary | ICD-10-CM | POA: Insufficient documentation

## 2017-11-25 DIAGNOSIS — X58XXXA Exposure to other specified factors, initial encounter: Secondary | ICD-10-CM | POA: Diagnosis not present

## 2017-11-25 DIAGNOSIS — Z95810 Presence of automatic (implantable) cardiac defibrillator: Secondary | ICD-10-CM | POA: Diagnosis not present

## 2017-11-25 DIAGNOSIS — N183 Chronic kidney disease, stage 3 (moderate): Secondary | ICD-10-CM | POA: Insufficient documentation

## 2017-11-25 DIAGNOSIS — Z79899 Other long term (current) drug therapy: Secondary | ICD-10-CM | POA: Insufficient documentation

## 2017-11-25 DIAGNOSIS — E669 Obesity, unspecified: Secondary | ICD-10-CM | POA: Diagnosis not present

## 2017-11-25 DIAGNOSIS — Z6830 Body mass index (BMI) 30.0-30.9, adult: Secondary | ICD-10-CM | POA: Diagnosis not present

## 2017-11-25 DIAGNOSIS — I5022 Chronic systolic (congestive) heart failure: Secondary | ICD-10-CM | POA: Diagnosis not present

## 2017-11-25 DIAGNOSIS — E785 Hyperlipidemia, unspecified: Secondary | ICD-10-CM | POA: Diagnosis not present

## 2017-11-25 DIAGNOSIS — I428 Other cardiomyopathies: Secondary | ICD-10-CM | POA: Diagnosis not present

## 2017-11-25 DIAGNOSIS — Z8249 Family history of ischemic heart disease and other diseases of the circulatory system: Secondary | ICD-10-CM | POA: Diagnosis not present

## 2017-11-25 DIAGNOSIS — I130 Hypertensive heart and chronic kidney disease with heart failure and stage 1 through stage 4 chronic kidney disease, or unspecified chronic kidney disease: Secondary | ICD-10-CM | POA: Diagnosis not present

## 2017-11-25 LAB — BASIC METABOLIC PANEL
Anion gap: 9 (ref 5–15)
BUN: 20 mg/dL (ref 6–20)
CALCIUM: 9.5 mg/dL (ref 8.9–10.3)
CO2: 26 mmol/L (ref 22–32)
CREATININE: 1.46 mg/dL — AB (ref 0.44–1.00)
Chloride: 101 mmol/L (ref 101–111)
GFR, EST AFRICAN AMERICAN: 45 mL/min — AB (ref >60)
GFR, EST NON AFRICAN AMERICAN: 39 mL/min — AB (ref >60)
Glucose, Bld: 90 mg/dL (ref 65–99)
Potassium: 4.5 mmol/L (ref 3.5–5.1)
SODIUM: 136 mmol/L (ref 135–145)

## 2017-11-25 LAB — CBC
HEMATOCRIT: 32.8 % — AB (ref 36.0–46.0)
HEMOGLOBIN: 10.5 g/dL — AB (ref 12.0–15.0)
MCH: 30.1 pg (ref 26.0–34.0)
MCHC: 32 g/dL (ref 30.0–36.0)
MCV: 94 fL (ref 78.0–100.0)
Platelets: 180 10*3/uL (ref 150–400)
RBC: 3.49 MIL/uL — ABNORMAL LOW (ref 3.87–5.11)
RDW: 15.1 % (ref 11.5–15.5)
WBC: 6.2 10*3/uL (ref 4.0–10.5)

## 2017-11-25 LAB — LIPID PANEL
CHOL/HDL RATIO: 3.2 ratio
Cholesterol: 226 mg/dL — ABNORMAL HIGH (ref 0–200)
HDL: 71 mg/dL (ref >40)
LDL Cholesterol: 138 mg/dL — ABNORMAL HIGH (ref 0–99)
Triglycerides: 83 mg/dL (ref <150)
VLDL: 17 mg/dL (ref 0–40)

## 2017-11-25 NOTE — Patient Instructions (Addendum)
Labs drawn today (if we do not call you, then your lab work was stable)   Your physician recommends that you return for lab work in: 3 months    Your physician has requested that you have an echocardiogram. Echocardiography is a painless test that uses sound waves to create images of your heart. It provides your doctor with information about the size and shape of your heart and how well your heart's chambers and valves are working. This procedure takes approximately one hour. There are no restrictions for this procedure.  Your physician recommends that you schedule a follow-up appointment in: 6 months with Dr. [name]  an a echocardiogram

## 2017-11-26 NOTE — Progress Notes (Signed)
Patient ID: Anita Warren, female   DOB: [birth year], 57 y.o.   MRN: [id number] Primary Physician: Glo Herring., MD  HF Cardiology: Dr. [name] EP: Dr Allred  Urology: Dr Risa Grill  HPI: Anita Warren is a 57 y.o. female with history of viral, non-ischemic cardiomyopathy, severe systolic dysfunction with EF of 10% s/p ICD pacemaker in the setting of VT 02/2011 (St. Jude);  cath 2012 with no CAD.   Admitted 7/15 with acute on chronic systolic CHF she was not taking medications for 4 months because she lost her insurance. She was diuresed with IV lasix and transitioned to lasix 40 mg daily. She was also discharged on 12.5 mg carvedilol twice a day, hydralazine 25 mg tid, and 30 mg Imdur daily. Discharge weight was 176 pounds. She was not on an ACEI due to angioedema.  Admitted from HF clinic 06/02/14 with low output heart failure. Swan placed and showed cardiogenic shock. Ultimately discharged on  Milrinone 0.25 mcg/kg/min. Hospital stay was complicated by atrial flutter. Loaded on amiodarone and started on eliquis 5 mg twice a day. Met with Dr Prescott Gum and VAD coordinator. She was titrated off the milrinone.  Echo in 8/17 showed EF up to 50%, echo in 8/18 showed EF 45%.    She returns for followup of CHF.  She is no longer on ACEI or ARB because of recurrent angioedema. Weight is down about 4 lbs.  She denies exertional dyspnea, no problems walking up stairs.  No chest pain.  No lightheadedness or palpitations.  No BRBPR/melena.   St Jude device interrogated: Corevue showed recent volume accumulation but now thoracic impedance back to baseline.    05/23/14 ECHO EF 5-10% RV mod to severely dilated.  08/09/14 ECHO EF 20-25% RV read as normal 8/17 ECHO EF 50%, mild LV dilation, normal RV size and systolic function.  8/18 ECHO EF 45%, diffuse HK worse inferiorly, normal RV size and systolic function.   Labs:  HIV negative 2015  9/15: K 3.6 => 4.6, creatinine 0.99 => 1.5 => 1.2, Mg 1.9, HCT  45.2, LFTs normal, TSH normal 9/15: PFTs normal 07/31/14: Co-ox off milrinone 66%  K 4.5 Cr 1.36 08/23/14: k 4.4, cr 1.51 digoxin 0.7 09/20/14: K 4.9, creatinine 1.64  12/15: K 4.4, creatinine 1.36 2/16: K 4.2, creatinine 1.67, HCT 33.9 04/28/2016: K 4.1 Creatinine 2.67  8/17: TSH normal, LFTs normal 9/17: K 3.5, creatinine 1.67, hgb 9.9 5/18: hgb 10.7, K 3.8, creatinine 1.45 9/18: K 4.4, creatinine 1.53  2013 Colonoscopy Blood Type:  B+  Lower Extremity Doppler - Normal arterial flow Carotid Doppler-  1-39% stenosis involving the right internal carotid artery and the left internal carotid Artery.  SH: Disabled  Lives with husband and son. Has 3 grown children. Does not smoke or drink alcohol.. 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    FH: Mom died at 33 CAD         Sister and Brother HTN   ROS: All systems negative except as listed in HPI, PMH and Problem List.  Past Medical History:  Diagnosis Date  . AICD (automatic cardioverter/defibrillator) present    St. Jude AICD implanted 03-07-2011 Dr. Allred/-Dr. [name] now follows  . CHF (congestive heart failure) ([place])    meds controlling, no episodes since 2014  . Chronic systolic heart failure ([place])   . Essential hypertension, benign   . Headache   . History of kidney stones    multiple kidney stones in past  . History of medication noncompliance   .  Hydronephrosis with renal and ureteral calculus obstruction 09/05/2013  . Nonischemic cardiomyopathy (HCC)    LVEF 5-10%, likely viral (no CAD by cath 01/30/11)  . NSVT (nonsustained ventricular tachycardia) ([place])   . Obesity     Current Outpatient Medications  Medication Sig Dispense Refill  . acetaminophen (TYLENOL) 500 MG tablet Take 1,000 mg by mouth every 6 (six) hours as needed for moderate pain.    Marland Kitchen apixaban (ELIQUIS) 5 MG TABS tablet Take 1 tablet (5 mg total) by mouth 2 (two) times daily. 180 tablet 3  . carvedilol (COREG) 25 MG tablet Take 1 tablet (25 mg total) by mouth 2  (two) times daily. 60 tablet 3  . Coenzyme Q10 (CO Q 10) 100 MG CAPS Take 100 mg by mouth daily.     . furosemide (LASIX) 40 MG tablet Take 1 tablet (40 mg total) 3 times a week, every Monday, Wednesday and Friday. 36 tablet 3  . hydrALAZINE (APRESOLINE) 100 MG tablet Take 1 tablet (100 mg total) by mouth 3 (three) times daily. 90 tablet 2  . HYDROcodone-acetaminophen (NORCO/VICODIN) 5-325 MG tablet Take 1-2 tablets by mouth every 4 (four) hours as needed. 6 tablet 0  . HYDROcodone-acetaminophen (NORCO/VICODIN) 5-325 MG tablet Take 1 tablet by mouth every 4 (four) hours as needed. 12 tablet 0  . isosorbide mononitrate (IMDUR) 30 MG 24 hr tablet TAKE 3 TABLETS BY MOUTH DAILY 90 tablet 6  . Magnesium Oxide 400 (240 MG) MG TABS Take 1 tablet by mouth daily. 90 tablet 3  . metoCLOPramide (REGLAN) 10 MG tablet Take 1 tablet (10 mg total) by mouth every 6 (six) hours as needed for nausea (or headache). 6 tablet 0  . mirtazapine (REMERON) 15 MG tablet Take 15 mg by mouth at bedtime.    . potassium chloride SA (K-DUR,KLOR-CON) 20 MEQ tablet Take 1 tablet (20 mEq total) by mouth daily. 30 tablet 3  . spironolactone (ALDACTONE) 25 MG tablet TAKE 1 TABLET BY MOUTH ONCE DAILY 90 tablet 0   No current facility-administered medications for this encounter.      PHYSICAL EXAM: BP 124/78 (BP Location: Right Arm, Patient Position: Sitting, Cuff Size: Normal)   Pulse 65   Wt 177 lb 12.8 oz (80.6 kg)   LMP 10/28/1999   SpO2 100%   BMI 30.52 kg/m  General: NAD Neck: No JVD, no thyromegaly or thyroid nodule.  Lungs: Clear to auscultation bilaterally with normal respiratory effort. CV: Nondisplaced PMI.  Heart regular S1/S2, no S3/S4, no murmur.  No peripheral edema.  No carotid bruit.  Normal pedal pulses.  Abdomen: Soft, nontender, no hepatosplenomegaly, no distention.  Skin: Intact without lesions or rashes.  Neurologic: Alert and oriented x 3.  Psych: Normal affect. Extremities: No clubbing or  cyanosis.  HEENT: Normal.   ASSESSMENT & PLAN: 1. Chronic Systolic Heart Failure: Nonischemic cardiomyopathy thought to potentially be due to viral myocarditis noted initially in 2011. Cath 2012 normal coronaries.  St Jude ICD. Echo (7/15) with EF 5-10% and RV mod-severely dilated with moderately decreased systolic function. Repeat echo 10/15 with EF up to 20-25%. She was on milrinone briefly then stopped.  Echo in 8/17 with EF up to 50%. Repeat echo 8/18 showed EF 45% with normal RV. NYHA class II symptoms, she is not volume overloaded on exam.  - Continue Lasix 40 mg three times weekly. BMET today and again in 3 months.  - Continue spironolactone 25 mg daily.   - No ACEI/ARB/ARNI due to recurrent episodes of  angioedema.  - Continue Coreg 25 mg bid.   - Continue hydralazine 100 mg tid and Imdur 90 mg daily.   - Repeat echo in 6 months.  2. Atrial flutter: Paroxysmal, NSR today.  - She is now off amiodarone.  If atrial flutter recurs, should have ablation.  - Continue Eliquis 5 mg bid. CBC today.  3. Angioedema: Had in past on ACEI, had another episode after ACEI stopped.  She is no longer on ACEI, ARNI, or ARB. ?Hereditary or acquired C1 inhibitor deficiency. No further problems with this recently.  4. CKD: Stage 3. Check BMET today.    BMET 3 months, followup 6 months.   Loralie Champagne 11/26/2017

## 2017-12-03 ENCOUNTER — Ambulatory Visit: Payer: Medicare Other | Admitting: General Surgery

## 2017-12-08 ENCOUNTER — Ambulatory Visit (INDEPENDENT_AMBULATORY_CARE_PROVIDER_SITE_OTHER): Payer: Medicare Other | Admitting: *Deleted

## 2017-12-08 ENCOUNTER — Telehealth: Payer: Self-pay | Admitting: Cardiology

## 2017-12-08 DIAGNOSIS — Z95810 Presence of automatic (implantable) cardiac defibrillator: Secondary | ICD-10-CM

## 2017-12-08 DIAGNOSIS — I5022 Chronic systolic (congestive) heart failure: Secondary | ICD-10-CM | POA: Diagnosis not present

## 2017-12-08 DIAGNOSIS — I428 Other cardiomyopathies: Secondary | ICD-10-CM

## 2017-12-08 NOTE — Telephone Encounter (Signed)
Spoke with pt and reminded pt of remote transmission that is due today. Pt verbalized understanding.   

## 2017-12-08 NOTE — Progress Notes (Signed)
EPIC Encounter for ICM Monitoring  Patient Name: Anita Warren is a 57 y.o. female Date: 12/08/2017 Primary Care Physican: Redmond School, MD Primary Cardiologist: [name]  Electrophysiologist: Allred Dry Weight:170lbs       Heart Failure questions reviewed, pt has a cold for the last few days.   Thoracic impedance normal.  Prescribed dosage: Furosemide 40 mg 1 tabletthreetimes a week, Monday, Wednesdayand Friday. Potassium 20 mEq 1tablet daily.   Labs: 11/25/2017 Creatinine 1.46, BUN 20, Potassium 4.5, Sodium 136, EBFR 39-45 07/01/2017 Creatinine 1.53, BUN 15, Potassium 4.4, Sodium 139, EGFR 37-43 06/16/2017 Creatinine 1.47, BUN 17, Potassium 3.2, Sodium 139, EGFR 39-45 03/26/2017 Creatinine 1.45, BUN 16, Potassium 3.8, Sodium 138, EGFR 40-46 07/16/2016 Creatinine 1.67, BUN 26, Potassium 3.5, Sodium 140, EGFR 34-39 06/23/2016 Creatinine 1.59, BUN 18, Potassium 3.9, Sodium 139, EGFR 36-41  04/28/2016 Creatinine 2.67, BUN 30, Potassium 4.1, Sodium 135, EGFR 19-22  04/25/2016 Creatinine 1.40, BUN 19, Potassium 3.5, Sodium 137, EGFR 42-48  Recommendations: No changes.  Encouraged to call for fluid symptoms.  Follow-up plan: ICM clinic phone appointment on 01/08/2018.    Copy of ICM check sent to Dr. Rayann Heman.   3 month ICM trend: 12/08/2017    1 Year ICM trend:       Rosalene Billings, RN 12/08/2017 2:24 PM

## 2017-12-09 ENCOUNTER — Encounter: Payer: Self-pay | Admitting: Cardiology

## 2017-12-09 NOTE — Progress Notes (Signed)
Remote ICD transmission.   

## 2017-12-15 LAB — CUP PACEART REMOTE DEVICE CHECK
Battery Remaining Longevity: 52 mo
Battery Remaining Percentage: 44 %
HighPow Impedance: 36 Ohm
Implantable Lead Implant Date: 20120511
Implantable Lead Location: 753860
Implantable Lead Model: 7120
Lead Channel Impedance Value: 430 Ohm
Lead Channel Pacing Threshold Pulse Width: 0.5 ms
Lead Channel Sensing Intrinsic Amplitude: 10.4 mV
Lead Channel Setting Pacing Amplitude: 2.5 V
Lead Channel Setting Pacing Pulse Width: 0.5 ms
MDC IDC MSMT BATTERY VOLTAGE: 2.93 V
MDC IDC MSMT LEADCHNL RV PACING THRESHOLD AMPLITUDE: 0.75 V
MDC IDC PG IMPLANT DT: 20120511
MDC IDC PG SERIAL: 634653
MDC IDC SESS DTM: 20190212190628
MDC IDC SET LEADCHNL RV SENSING SENSITIVITY: 0.5 mV
MDC IDC STAT BRADY RV PERCENT PACED: 1 %

## 2018-01-05 ENCOUNTER — Other Ambulatory Visit: Payer: Self-pay | Admitting: Cardiology

## 2018-01-06 ENCOUNTER — Other Ambulatory Visit (HOSPITAL_COMMUNITY): Payer: Self-pay | Admitting: Cardiology

## 2018-01-06 DIAGNOSIS — I5022 Chronic systolic (congestive) heart failure: Secondary | ICD-10-CM

## 2018-01-08 ENCOUNTER — Telehealth: Payer: Self-pay | Admitting: Cardiology

## 2018-01-08 ENCOUNTER — Ambulatory Visit (INDEPENDENT_AMBULATORY_CARE_PROVIDER_SITE_OTHER): Payer: Medicare Other

## 2018-01-08 DIAGNOSIS — I5022 Chronic systolic (congestive) heart failure: Secondary | ICD-10-CM

## 2018-01-08 DIAGNOSIS — Z95810 Presence of automatic (implantable) cardiac defibrillator: Secondary | ICD-10-CM

## 2018-01-08 NOTE — Telephone Encounter (Signed)
Spoke with pt and reminded pt of remote transmission that is due today. Pt verbalized understanding.   

## 2018-01-08 NOTE — Progress Notes (Signed)
EPIC Encounter for ICM Monitoring  Patient Name: Anita Warren is a 57 y.o. female Date: 01/08/2018 Primary Care Physican: Redmond School, MD Primary Cardiologist: [name]  Electrophysiologist: Allred Dry Weight:174lbs      Heart Failure questions reviewed, pt asymptomatic with tiredness for past 2 days   Thoracic impedance abnormal suggesting fluid accumulation starting 01/07/2018.  Prescribed dosage: Furosemide 40 mg 1 tabletthreetimes a week, Monday, Wednesdayand Friday. Potassium 20 mEq 1tablet daily.   Labs: 11/25/2017 Creatinine 1.46, BUN 20, Potassium 4.5, Sodium 136, EBFR 39-45 07/01/2017 Creatinine 1.53, BUN 15, Potassium 4.4, Sodium 139, EGFR 37-43 06/16/2017 Creatinine 1.47, BUN 17, Potassium 3.2, Sodium 139, EGFR 39-45 03/26/2017 Creatinine 1.45, BUN 16, Potassium 3.8, Sodium 138, EGFR 40-46 07/16/2016 Creatinine 1.67, BUN 26, Potassium 3.5, Sodium 140, EGFR 34-39 06/23/2016 Creatinine 1.59, BUN 18, Potassium 3.9, Sodium 139, EGFR 36-41  04/28/2016 Creatinine 2.67, BUN 30, Potassium 4.1, Sodium 135, EGFR 19-22  04/25/2016 Creatinine 1.40, BUN 19, Potassium 3.5, Sodium 137, EGFR 42-48  Recommendations: No changes.  Discussed limiting salt to 2000 mg daily and fluid intake to 64 oz daily.   Encouraged to call for fluid symptoms.  Follow-up plan: ICM clinic phone appointment on 12/21/2017.    Copy of ICM check sent to Dr. [name] and Dr. Rayann Heman for review and if any recommendations will call her back.   3 month ICM trend: 01/08/2018    1 Year ICM trend:       Rosalene Billings, RN 01/08/2018 2:54 PM

## 2018-01-18 ENCOUNTER — Ambulatory Visit (INDEPENDENT_AMBULATORY_CARE_PROVIDER_SITE_OTHER): Payer: Self-pay

## 2018-01-18 ENCOUNTER — Telehealth: Payer: Self-pay | Admitting: Cardiology

## 2018-01-18 DIAGNOSIS — Z95810 Presence of automatic (implantable) cardiac defibrillator: Secondary | ICD-10-CM

## 2018-01-18 DIAGNOSIS — I5022 Chronic systolic (congestive) heart failure: Secondary | ICD-10-CM

## 2018-01-18 NOTE — Telephone Encounter (Signed)
Spoke with pt and reminded pt of remote transmission that is due today. Pt verbalized understanding.   

## 2018-01-19 NOTE — Progress Notes (Signed)
EPIC Encounter for ICM Monitoring  Patient Name: Anita Warren is a 57 y.o. female Date: 01/19/2018 Primary Care Physican: Redmond School, MD Primary Cardiologist: [name]  Electrophysiologist: Allred Dry Weight:Previous weight 174lbs       Heart Failure questions reviewed, pt asymptomatic.  She said she feels much better after getting rid of the fluid accumulation.  She has more energy now.   Thoracic impedance returned to normal after last ICM call on 01/08/2018.   Prescribed dosage: Furosemide 40 mg 1 tabletthreetimes a week, Monday, Wednesdayand Friday. Potassium 20 mEq 1tablet daily.   Labs: 11/25/2017 Creatinine 1.46, BUN 20, Potassium 4.5, Sodium 136, EBFR 39-45 07/01/2017 Creatinine 1.53, BUN 15, Potassium 4.4, Sodium 139, EGFR 37-43 06/16/2017 Creatinine 1.47, BUN 17, Potassium 3.2, Sodium 139, EGFR 39-45 03/26/2017 Creatinine 1.45, BUN 16, Potassium 3.8, Sodium 138, EGFR 40-46 07/16/2016 Creatinine 1.67, BUN 26, Potassium 3.5, Sodium 140, EGFR 34-39 06/23/2016 Creatinine 1.59, BUN 18, Potassium 3.9, Sodium 139, EGFR 36-41  04/28/2016 Creatinine 2.67, BUN 30, Potassium 4.1, Sodium 135, EGFR 19-22  04/25/2016 Creatinine 1.40, BUN 19, Potassium 3.5, Sodium 137, EGFR 42-48   Recommendations: No changes.    Encouraged to call for fluid symptoms.  Follow-up plan: ICM clinic phone appointment on 02/11/2018.    Copy of ICM check sent to Dr. Rayann Heman.   3 month ICM trend: 01/18/2018    1 Year ICM trend:       Rosalene Billings, RN 01/19/2018 3:24 PM

## 2018-02-05 ENCOUNTER — Other Ambulatory Visit (HOSPITAL_COMMUNITY): Payer: Self-pay | Admitting: Internal Medicine

## 2018-02-11 ENCOUNTER — Telehealth: Payer: Self-pay | Admitting: Cardiology

## 2018-02-11 ENCOUNTER — Ambulatory Visit (INDEPENDENT_AMBULATORY_CARE_PROVIDER_SITE_OTHER): Payer: Medicare Other

## 2018-02-11 DIAGNOSIS — Z95810 Presence of automatic (implantable) cardiac defibrillator: Secondary | ICD-10-CM | POA: Diagnosis not present

## 2018-02-11 DIAGNOSIS — I5022 Chronic systolic (congestive) heart failure: Secondary | ICD-10-CM | POA: Diagnosis not present

## 2018-02-11 NOTE — Telephone Encounter (Signed)
Spoke with pt and reminded pt of remote transmission that is due today. Pt verbalized understanding.   

## 2018-02-11 NOTE — Progress Notes (Signed)
EPIC Encounter for ICM Monitoring  Patient Name: Anita Warren is a 57 y.o. female Date: 02/11/2018 Primary Care Physican: Redmond School, MD Primary Cardiologist: [name]  Electrophysiologist: Allred Dry Weight:173lbs       Heart Failure questions reviewed, pt asymptomatic.   Thoracic impedance normal.  Prescribed dosage: Furosemide 40 mg 1 tabletthreetimes a week, Monday, Wednesdayand Friday. Potassium 20 mEq 1tablet daily.   Labs: 11/25/2017 Creatinine 1.46, BUN 20, Potassium 4.5, Sodium 136, EBFR 39-45 07/01/2017 Creatinine 1.53, BUN 15, Potassium 4.4, Sodium 139, EGFR 37-43 06/16/2017 Creatinine 1.47, BUN 17, Potassium 3.2, Sodium 139, EGFR 39-45 03/26/2017 Creatinine 1.45, BUN 16, Potassium 3.8, Sodium 138, EGFR 40-46 07/16/2016 Creatinine 1.67, BUN 26, Potassium 3.5, Sodium 140, EGFR 34-39 06/23/2016 Creatinine 1.59, BUN 18, Potassium 3.9, Sodium 139, EGFR 36-41  04/28/2016 Creatinine 2.67, BUN 30, Potassium 4.1, Sodium 135, EGFR 19-22  04/25/2016 Creatinine 1.40, BUN 19, Potassium 3.5, Sodium 137, EGFR 42-48  Recommendations: No changes.  Encouraged to call for fluid symptoms.  Follow-up plan: ICM clinic phone appointment on 03/15/2018.   Copy of ICM check sent to Dr. Rayann Heman.   3 month ICM trend: 02/11/2018    1 Year ICM trend:       Rosalene Billings, RN 02/11/2018 3:50 PM

## 2018-02-19 ENCOUNTER — Telehealth (HOSPITAL_COMMUNITY): Payer: Self-pay | Admitting: Pharmacist

## 2018-02-19 ENCOUNTER — Other Ambulatory Visit: Payer: Self-pay | Admitting: Internal Medicine

## 2018-02-19 NOTE — Telephone Encounter (Signed)
This is CHF pt °

## 2018-02-19 NOTE — Telephone Encounter (Signed)
[employer] called [name] and stated that she needed to have spent another $170 on her medications for the year to qualify for patient assistance for her Eliquis. She will get a new print out from her pharmacy and send it to [place]. In the meantime will provide with samples if needed.   Ruta Hinds. Velva Harman, PharmD, BCPS, CPP Clinical Pharmacist Phone: [phone] 02/19/2018 10:02 AM

## 2018-02-22 ENCOUNTER — Ambulatory Visit (HOSPITAL_COMMUNITY)
Admission: RE | Admit: 2018-02-22 | Discharge: 2018-02-22 | Disposition: A | Discharge status: Home / Self Care | Payer: Medicare Other | Source: Ambulatory Visit | Attending: Cardiology | Admitting: Cardiology

## 2018-02-22 DIAGNOSIS — I5022 Chronic systolic (congestive) heart failure: Secondary | ICD-10-CM | POA: Diagnosis not present

## 2018-02-22 LAB — BASIC METABOLIC PANEL
ANION GAP: 8 (ref 5–15)
BUN: 20 mg/dL (ref 6–20)
CHLORIDE: 105 mmol/L (ref 101–111)
CO2: 26 mmol/L (ref 22–32)
Calcium: 9.2 mg/dL (ref 8.9–10.3)
Creatinine, Ser: 1.32 mg/dL — ABNORMAL HIGH (ref 0.44–1.00)
GFR calc Af Amer: 51 mL/min — ABNORMAL LOW (ref >60)
GFR, EST NON AFRICAN AMERICAN: 44 mL/min — AB (ref >60)
Glucose, Bld: 116 mg/dL — ABNORMAL HIGH (ref 65–99)
POTASSIUM: 3.8 mmol/L (ref 3.5–5.1)
SODIUM: 139 mmol/L (ref 135–145)

## 2018-02-24 ENCOUNTER — Other Ambulatory Visit (HOSPITAL_COMMUNITY): Payer: Self-pay | Admitting: *Deleted

## 2018-02-24 MED ORDER — SPIRONOLACTONE 25 MG PO TABS: 25.0000 mg | ORAL_TABLET | Freq: Every day | ORAL | 3 refills | Status: DC

## 2018-03-01 ENCOUNTER — Other Ambulatory Visit (HOSPITAL_COMMUNITY): Payer: Self-pay | Admitting: Cardiology

## 2018-03-09 ENCOUNTER — Telehealth (HOSPITAL_COMMUNITY): Payer: Self-pay

## 2018-03-09 NOTE — Telephone Encounter (Signed)
Patient calling for samples of eliquis. $42 remaining on deductible for meds. Will supply 1 month of eliquis samples for patient to p/u at CHF clinic front office. Patient aware and appreciative.

## 2018-03-15 ENCOUNTER — Telehealth: Payer: Self-pay | Admitting: Cardiology

## 2018-03-15 ENCOUNTER — Ambulatory Visit (INDEPENDENT_AMBULATORY_CARE_PROVIDER_SITE_OTHER): Payer: Medicare Other | Admitting: *Deleted

## 2018-03-15 DIAGNOSIS — I428 Other cardiomyopathies: Secondary | ICD-10-CM

## 2018-03-15 DIAGNOSIS — Z95810 Presence of automatic (implantable) cardiac defibrillator: Secondary | ICD-10-CM | POA: Diagnosis not present

## 2018-03-15 DIAGNOSIS — I5022 Chronic systolic (congestive) heart failure: Secondary | ICD-10-CM

## 2018-03-15 NOTE — Telephone Encounter (Signed)
Spoke with pt and reminded pt of remote transmission that is due today. Pt verbalized understanding.   

## 2018-03-16 ENCOUNTER — Encounter: Payer: Self-pay | Admitting: Cardiology

## 2018-03-16 NOTE — Progress Notes (Signed)
Remote ICD transmission.   

## 2018-03-16 NOTE — Progress Notes (Signed)
Primary Cardiologist: [name]  Electrophysiologist: Allred Dry Weight:174lbs                                                   Heart Failure questions reviewed, pt asymptomatic.   Thoracic impedance normal.  Prescribed dosage: Furosemide 40 mg 1 tabletthreetimes a week, Monday, Wednesdayand Friday. Potassium 20 mEq 1tablet daily.   Labs: 11/25/2017 Creatinine 1.46, BUN 20, Potassium 4.5, Sodium 136, EBFR 39-45 07/01/2017 Creatinine 1.53, BUN 15, Potassium 4.4, Sodium 139, EGFR 37-43 06/16/2017 Creatinine 1.47, BUN 17, Potassium 3.2, Sodium 139, EGFR 39-45 03/26/2017 Creatinine 1.45, BUN 16, Potassium 3.8, Sodium 138, EGFR 40-46 07/16/2016 Creatinine 1.67, BUN 26, Potassium 3.5, Sodium 140, EGFR 34-39 06/23/2016 Creatinine 1.59, BUN 18, Potassium 3.9, Sodium 139, EGFR 36-41  04/28/2016 Creatinine 2.67, BUN 30, Potassium 4.1, Sodium 135, EGFR 19-22  04/25/2016 Creatinine 1.40, BUN 19, Potassium 3.5, Sodium 137, EGFR 42-48  Recommendations: No changes.   Encouraged to call for fluid symptoms.  Follow-up plan: ICM clinic phone appointment on 04/15/2018.    Copy of ICM check sent to Dr. Rayann Heman.   3 month ICM trend: 03/15/2018    1 Year ICM trend:       Rosalene Billings, RN 03/16/2018 2:30 PM

## 2018-03-17 LAB — CUP PACEART REMOTE DEVICE CHECK
Battery Voltage: 2.92 V
HighPow Impedance: 39 Ohm
Implantable Lead Implant Date: 20120511
Implantable Lead Location: 753860
Implantable Lead Model: 7120
Implantable Pulse Generator Implant Date: 20120511
Lead Channel Pacing Threshold Amplitude: 0.75 V
Lead Channel Setting Pacing Amplitude: 2.5 V
Lead Channel Setting Sensing Sensitivity: 0.5 mV
MDC IDC MSMT BATTERY REMAINING LONGEVITY: 49 mo
MDC IDC MSMT BATTERY REMAINING PERCENTAGE: 42 %
MDC IDC MSMT LEADCHNL RV IMPEDANCE VALUE: 430 Ohm
MDC IDC MSMT LEADCHNL RV PACING THRESHOLD PULSEWIDTH: 0.5 ms
MDC IDC MSMT LEADCHNL RV SENSING INTR AMPL: 11.7 mV
MDC IDC SESS DTM: 20190520185840
MDC IDC SET LEADCHNL RV PACING PULSEWIDTH: 0.5 ms
MDC IDC STAT BRADY RV PERCENT PACED: 1 %
Pulse Gen Serial Number: 634653

## 2018-04-15 ENCOUNTER — Ambulatory Visit (INDEPENDENT_AMBULATORY_CARE_PROVIDER_SITE_OTHER): Payer: Medicare Other

## 2018-04-15 ENCOUNTER — Telehealth: Payer: Self-pay | Admitting: Cardiology

## 2018-04-15 DIAGNOSIS — Z95810 Presence of automatic (implantable) cardiac defibrillator: Secondary | ICD-10-CM

## 2018-04-15 DIAGNOSIS — I5022 Chronic systolic (congestive) heart failure: Secondary | ICD-10-CM

## 2018-04-15 NOTE — Telephone Encounter (Signed)
LMOVM reminding pt to send remote transmission.   

## 2018-04-16 ENCOUNTER — Telehealth (HOSPITAL_COMMUNITY): Payer: Self-pay | Admitting: *Deleted

## 2018-04-16 NOTE — Telephone Encounter (Signed)
Patient stated she was submitted application to [employer] today for Eliquis assistance.  She is asking for samples until the application is approved.    Samples provided below.  Medication Samples have been provided to the patient.  Drug name: Eliquis     Strength: 5mg        Qty: 4 LOT: [id number] Exp.Date: 3/21  Dosing instructions: Take 1 tablet by mouth twice daily.   The patient has been instructed regarding the correct time, dose, and frequency of taking this medication, including desired effects and most common side effects.   Darron Doom 2:15 PM 04/16/2018

## 2018-04-16 NOTE — Progress Notes (Signed)
EPIC Encounter for ICM Monitoring  Patient Name: Anita Warren is a 57 y.o. female Date: 04/16/2018 Primary Care Physican: Redmond School, MD Primary Cardiologist: [name]  Electrophysiologist: Allred Dry Weight:174lbs      Heart Failure questions reviewed, pt asymptomatic.   Thoracic impedance normal.  Prescribed dosage: Furosemide 40 mg 1 tabletthreetimes a week, Monday, Wednesdayand Friday. Potassium 20 mEq 1tablet daily.   Labs: 02/22/2018 Creatinine 1.32, BUN 20, Potassium 3.8, Sodium 139, EGFR 44-51 11/25/2017 Creatinine 1.46, BUN 20, Potassium 4.5, Sodium 136, EBFR 39-45 07/01/2017 Creatinine 1.53, BUN 15, Potassium 4.4, Sodium 139, EGFR 37-43 06/16/2017 Creatinine 1.47, BUN 17, Potassium 3.2, Sodium 139, EGFR 39-45 03/26/2017 Creatinine 1.45, BUN 16, Potassium 3.8, Sodium 138, EGFR 40-46 07/16/2016 Creatinine 1.67, BUN 26, Potassium 3.5, Sodium 140, EGFR 34-39 06/23/2016 Creatinine 1.59, BUN 18, Potassium 3.9, Sodium 139, EGFR 36-41  04/28/2016 Creatinine 2.67, BUN 30, Potassium 4.1, Sodium 135, EGFR 19-22  04/25/2016 Creatinine 1.40, BUN 19, Potassium 3.5, Sodium 137, EGFR 42-48  Recommendations: No changes.   Encouraged to call for fluid symptoms.  Follow-up plan: ICM clinic phone appointment on 05/17/2018.    Copy of ICM check sent to Dr. Rayann Heman.   3 month ICM trend: 04/15/2018    1 Year ICM trend:       Rosalene Billings, RN 04/16/2018 7:37 AM

## 2018-04-17 ENCOUNTER — Other Ambulatory Visit (HOSPITAL_COMMUNITY): Payer: Self-pay | Admitting: Cardiology

## 2018-04-17 DIAGNOSIS — I5023 Acute on chronic systolic (congestive) heart failure: Secondary | ICD-10-CM

## 2018-05-04 ENCOUNTER — Other Ambulatory Visit (HOSPITAL_COMMUNITY): Payer: Self-pay | Admitting: Internal Medicine

## 2018-05-17 ENCOUNTER — Ambulatory Visit (INDEPENDENT_AMBULATORY_CARE_PROVIDER_SITE_OTHER): Payer: Medicare Other

## 2018-05-17 DIAGNOSIS — I5022 Chronic systolic (congestive) heart failure: Secondary | ICD-10-CM

## 2018-05-17 DIAGNOSIS — Z95810 Presence of automatic (implantable) cardiac defibrillator: Secondary | ICD-10-CM

## 2018-05-17 NOTE — Progress Notes (Signed)
EPIC Encounter for ICM Monitoring  Patient Name: Anita Warren is a 57 y.o. female Date: 05/17/2018 Primary Care Physican: Redmond School, MD Primary Cardiologist: [name]  Electrophysiologist: Allred Dry Weight:174lbs        Heart Failure questions reviewed, pt asymptomatic.   Thoracic impedance normal but was abnormal suggesting fluid accumulation from 04/15/2018 - 04/25/2018.  Prescribed dosage: Furosemide 40 mg 1 tabletthreetimes a week, Monday, Wednesdayand Friday. Potassium 20 mEq 1tablet daily.   Labs: 02/22/2018 Creatinine 1.32, BUN 20, Potassium 3.8, Sodium 139, EGFR 44-51 11/25/2017 Creatinine 1.46, BUN 20, Potassium 4.5, Sodium 136, EBFR 39-45 07/01/2017 Creatinine 1.53, BUN 15, Potassium 4.4, Sodium 139, EGFR 37-43 06/16/2017 Creatinine 1.47, BUN 17, Potassium 3.2, Sodium 139, EGFR 39-45 03/26/2017 Creatinine 1.45, BUN 16, Potassium 3.8, Sodium 138, EGFR 40-46 07/16/2016 Creatinine 1.67, BUN 26, Potassium 3.5, Sodium 140, EGFR 34-39 06/23/2016 Creatinine 1.59, BUN 18, Potassium 3.9, Sodium 139, EGFR 36-41  04/28/2016 Creatinine 2.67, BUN 30, Potassium 4.1, Sodium 135, EGFR 19-22  04/25/2016 Creatinine 1.40, BUN 19, Potassium 3.5, Sodium 137, EGFR 42-48  Recommendations: No changes.   Encouraged to call for fluid symptoms.  Follow-up plan: ICM clinic phone appointment on 06/17/2018.    Copy of ICM check sent to Dr. Rayann Heman.   3 month ICM trend: 05/17/2018    1 Year ICM trend:       Rosalene Billings, RN 05/17/2018 10:35 AM

## 2018-06-04 ENCOUNTER — Other Ambulatory Visit (HOSPITAL_COMMUNITY): Payer: Self-pay

## 2018-06-04 MED ORDER — CARVEDILOL 25 MG PO TABS: 25.0000 mg | ORAL_TABLET | Freq: Two times a day (BID) | ORAL | 0 refills | Status: DC

## 2018-06-04 MED ORDER — HYDRALAZINE HCL 100 MG PO TABS: 100.0000 mg | ORAL_TABLET | Freq: Three times a day (TID) | ORAL | 3 refills | Status: DC

## 2018-06-17 ENCOUNTER — Ambulatory Visit (INDEPENDENT_AMBULATORY_CARE_PROVIDER_SITE_OTHER): Payer: Medicare Other | Admitting: *Deleted

## 2018-06-17 ENCOUNTER — Telehealth: Payer: Self-pay

## 2018-06-17 ENCOUNTER — Ambulatory Visit (INDEPENDENT_AMBULATORY_CARE_PROVIDER_SITE_OTHER): Payer: Medicare Other

## 2018-06-17 DIAGNOSIS — I428 Other cardiomyopathies: Secondary | ICD-10-CM

## 2018-06-17 DIAGNOSIS — Z95810 Presence of automatic (implantable) cardiac defibrillator: Secondary | ICD-10-CM | POA: Diagnosis not present

## 2018-06-17 DIAGNOSIS — I5022 Chronic systolic (congestive) heart failure: Secondary | ICD-10-CM

## 2018-06-17 NOTE — Telephone Encounter (Signed)
Spoke with pt and reminded pt of remote transmission that is due today. Pt verbalized understanding.   

## 2018-06-18 ENCOUNTER — Encounter: Payer: Self-pay | Admitting: Cardiology

## 2018-06-18 NOTE — Progress Notes (Signed)
EPIC Encounter for ICM Monitoring  Patient Name: Anita Warren is a 57 y.o. female Date: 06/18/2018 Primary Care Physican: Redmond School, MD Primary Cardiologist: [name]  Electrophysiologist: Allred Dry Weight:174lbs                                                               Heart Failure questions reviewed, pt asymptomatic.  Her Furosemide prescription ran out during decreased impedance. Patient reported she had refill of Furosemide but the frequency was changed from Monday, Wednesday and Friday to Monday and Friday but she is not sure why.  Advised to call HF clinic for appointment and Dr Jackalyn Lombard office for appointment.     Thoracic impedance normal but was abnormal suggesting fluid accumulation from 05/22/2018 through 06/12/2018.  Prescribed dosage: Furosemide 40 mg 1 tablettwo times a week, Monday and Friday per latest prescription. Potassium 20 mEq 1tablet daily.   Labs: 02/22/2018 Creatinine 1.32, BUN 20, Potassium 3.8, Sodium 139, EGFR 44-51 11/25/2017 Creatinine 1.46, BUN 20, Potassium 4.5, Sodium 136, EBFR 39-45 07/01/2017 Creatinine 1.53, BUN 15, Potassium 4.4, Sodium 139, EGFR 37-43 06/16/2017 Creatinine 1.47, BUN 17, Potassium 3.2, Sodium 139, EGFR 39-45 03/26/2017 Creatinine 1.45, BUN 16, Potassium 3.8, Sodium 138, EGFR 40-46 07/16/2016 Creatinine 1.67, BUN 26, Potassium 3.5, Sodium 140, EGFR 34-39 06/23/2016 Creatinine 1.59, BUN 18, Potassium 3.9, Sodium 139, EGFR 36-41  04/28/2016 Creatinine 2.67, BUN 30, Potassium 4.1, Sodium 135, EGFR 19-22  04/25/2016 Creatinine 1.40, BUN 19, Potassium 3.5, Sodium 137, EGFR 42-48  Recommendations: No changes.   Encouraged to call for fluid symptoms.  Follow-up plan: ICM clinic phone appointment on 07/19/2018.   Advised overdue to make appointments with Dr [name] (last visit 11/25/2017-3 month) and Dr Rayann Heman (last visit 02/18/2017)  Copy of ICM check sent to Dr. Rayann Heman.   3 month ICM trend: 06/17/2018    1 Year  ICM trend:       Rosalene Billings, RN 06/18/2018 9:00 AM

## 2018-06-18 NOTE — Progress Notes (Signed)
Remote ICD transmission.   

## 2018-07-12 ENCOUNTER — Other Ambulatory Visit (HOSPITAL_COMMUNITY): Payer: Self-pay | Admitting: Internal Medicine

## 2018-07-12 DIAGNOSIS — I5022 Chronic systolic (congestive) heart failure: Secondary | ICD-10-CM

## 2018-07-23 ENCOUNTER — Ambulatory Visit (INDEPENDENT_AMBULATORY_CARE_PROVIDER_SITE_OTHER): Payer: Medicare Other

## 2018-07-23 DIAGNOSIS — I5022 Chronic systolic (congestive) heart failure: Secondary | ICD-10-CM | POA: Diagnosis not present

## 2018-07-23 DIAGNOSIS — Z95810 Presence of automatic (implantable) cardiac defibrillator: Secondary | ICD-10-CM

## 2018-07-23 LAB — CUP PACEART REMOTE DEVICE CHECK
Battery Remaining Longevity: 47 mo
HighPow Impedance: 35 Ohm
Implantable Lead Implant Date: 20120511
Implantable Lead Model: 7120
Implantable Pulse Generator Implant Date: 20120511
Lead Channel Pacing Threshold Amplitude: 0.75 V
Lead Channel Pacing Threshold Pulse Width: 0.5 ms
Lead Channel Setting Pacing Pulse Width: 0.5 ms
Lead Channel Setting Sensing Sensitivity: 0.5 mV
MDC IDC LEAD LOCATION: 753860
MDC IDC MSMT BATTERY REMAINING PERCENTAGE: 40 %
MDC IDC MSMT BATTERY VOLTAGE: 2.92 V
MDC IDC MSMT LEADCHNL RV IMPEDANCE VALUE: 400 Ohm
MDC IDC MSMT LEADCHNL RV SENSING INTR AMPL: 10.4 mV
MDC IDC SESS DTM: 20190822201613
MDC IDC SET LEADCHNL RV PACING AMPLITUDE: 2.5 V
MDC IDC STAT BRADY RV PERCENT PACED: 1 %
Pulse Gen Serial Number: 634653

## 2018-07-23 NOTE — Progress Notes (Signed)
EPIC Encounter for ICM Monitoring  Patient Name: Anita Warren is a 57 y.o. female Date: 07/23/2018 Primary Care Physican: Redmond School, MD Primary Cardiologist: [name]  Electrophysiologist: Allred Dry Weight:178lbs       Heart Failure questions reviewed, pt asymptomatic.  She stated she is feeling fine.   Thoracic impedance abnormal suggesting fluid accumulation starting 07/21/2018.  Prescribed: Furosemide 40 mg 1 tablettwo times a week, Monday and Friday per latest prescription. Potassium 20 mEq 1tablet daily.   Labs: 02/22/2018 Creatinine 1.32, BUN 20, Potassium 3.8, Sodium 139, EGFR 44-51 11/25/2017 Creatinine 1.46, BUN 20, Potassium 4.5, Sodium 136, EBFR 39-45 07/01/2017 Creatinine 1.53, BUN 15, Potassium 4.4, Sodium 139, EGFR 37-43 06/16/2017 Creatinine 1.47, BUN 17, Potassium 3.2, Sodium 139, EGFR 39-45 03/26/2017 Creatinine 1.45, BUN 16, Potassium 3.8, Sodium 138, EGFR 40-46 07/16/2016 Creatinine 1.67, BUN 26, Potassium 3.5, Sodium 140, EGFR 34-39 06/23/2016 Creatinine 1.59, BUN 18, Potassium 3.9, Sodium 139, EGFR 36-41  04/28/2016 Creatinine 2.67, BUN 30, Potassium 4.1, Sodium 135, EGFR 19-22  04/25/2016 Creatinine 1.40, BUN 19, Potassium 3.5, Sodium 137, EGFR 42-48  Recommendations:  Reinforced salt restriction to < 2000 mg daily and fluid intake to 64 oz daily.  Encouraged to call for fluid symptoms.  Follow-up plan: ICM clinic phone appointment on 08/02/2018 to recheck fluid levels.   Advised overdue to make appointments with Dr [name] (last visit 11/25/2017-3 month) and Dr Rayann Heman (last visit 02/18/2017).  She will call the office next week for appointments.  Copy of ICM check sent to Dr. Rayann Heman and Dr [name] for review and will call back with recommendations if needed.   3 month ICM trend: 07/23/2018    1 Year ICM trend:       Rosalene Billings, RN 07/23/2018 5:07 PM

## 2018-07-26 ENCOUNTER — Telehealth (HOSPITAL_COMMUNITY): Payer: Self-pay

## 2018-07-26 NOTE — Progress Notes (Signed)
Take Lasix 40 mg daily x 3 days then back to prior dosing.

## 2018-07-26 NOTE — Progress Notes (Signed)
Attempted call to patient to provide Dr Anita Warren recommendation to take Lasix 40 mg daily x 3 days then back to prior dosing.  Left message to return call.

## 2018-07-26 NOTE — Telephone Encounter (Signed)
Pt called and informed that Dr. [name] wants her to Take Lasix 40 mg daily x 3 days then back to prior dosing. Pt verbalized understanding

## 2018-07-26 NOTE — Progress Notes (Signed)
Patient returned call.  Advised to take Lasix 40 mg daily x 3 days then back to prior dosing.  She verbalized understanding.  Recheck remote transmission 08/02/2018

## 2018-07-30 ENCOUNTER — Telehealth: Payer: Self-pay | Admitting: *Deleted

## 2018-07-30 ENCOUNTER — Telehealth (HOSPITAL_COMMUNITY): Payer: Self-pay | Admitting: Pharmacist

## 2018-07-30 NOTE — Telephone Encounter (Signed)
Spoke with patient about BEAT HF.  Pt will schedule appt for follow up with [facility] and echo.  I will speak with her at appt for beat!!

## 2018-07-30 NOTE — Telephone Encounter (Signed)
[employer] patient assistance approved for Eliquis 5 mg BID through 10/26/18.   Ruta Hinds. Velva Harman, PharmD, BCPS, CPP Clinical Pharmacist Phone: [phone] 07/30/2018 9:15 AM

## 2018-08-02 ENCOUNTER — Telehealth: Payer: Self-pay

## 2018-08-02 ENCOUNTER — Ambulatory Visit (INDEPENDENT_AMBULATORY_CARE_PROVIDER_SITE_OTHER): Payer: Medicare Other

## 2018-08-02 DIAGNOSIS — Z95810 Presence of automatic (implantable) cardiac defibrillator: Secondary | ICD-10-CM

## 2018-08-02 DIAGNOSIS — I5022 Chronic systolic (congestive) heart failure: Secondary | ICD-10-CM

## 2018-08-02 NOTE — Telephone Encounter (Signed)
Spoke with pt and reminded pt of remote transmission that is due today. Pt verbalized understanding.   

## 2018-08-02 NOTE — Progress Notes (Signed)
EPIC Encounter for ICM Monitoring  Patient Name: Anita Warren is a 57 y.o. female Date: 08/02/2018 Primary Care Physican: Redmond School, MD Primary Cardiologist: [name]  Electrophysiologist: Allred Dry Weight:178lbs      Heart Failure questions reviewed, pt asymptomatic. Discussed the foods she ate between 10/5 and 10/6 that may have cause fluid reaccumulation. She said she has been eating processed meats such as hotdogs and bologna and I explained these are foods high in salt. Advised to read food labels for salt amount per serving and total number should not exceed 2000 mg a day.   She did not realize those food where high in salt.    Thoracic impedance returned to normal after taking extra Furosemide as ordered by Dr [name] but started reaccumulating 10/6.  Impedance is starting to trend up toward baseline.  Prescribed: Furosemide 40 mg 1 tablettwotimes a week, Monday and Fridayper latest prescription. Potassium 20 mEq 1tablet daily.   Labs: 02/22/2018 Creatinine 1.32, BUN 20, Potassium 3.8, Sodium 139, EGFR 44-51 11/25/2017 Creatinine 1.46, BUN 20, Potassium 4.5, Sodium 136, EBFR 39-45 07/01/2017 Creatinine 1.53, BUN 15, Potassium 4.4, Sodium 139, EGFR 37-43 06/16/2017 Creatinine 1.47, BUN 17, Potassium 3.2, Sodium 139, EGFR 39-45 03/26/2017 Creatinine 1.45, BUN 16, Potassium 3.8, Sodium 138, EGFR 40-46 07/16/2016 Creatinine 1.67, BUN 26, Potassium 3.5, Sodium 140, EGFR 34-39 06/23/2016 Creatinine 1.59, BUN 18, Potassium 3.9, Sodium 139, EGFR 36-41  04/28/2016 Creatinine 2.67, BUN 30, Potassium 4.1, Sodium 135, EGFR 19-22  04/25/2016 Creatinine 1.40, BUN 19, Potassium 3.5, Sodium 137, EGFR 42-48  Recommendations:  No changes.  Reinforced limiting salt intake to < 2000 mg daily and fluid intake to 64 oz daily.  Encouraged to call for fluid symptoms.  Follow-up plan: ICM clinic phone appointment on 08/23/2018.   Office appointment scheduled 09/20/2018 with Dr.  [name].    Copy of ICM check sent to Dr. Rayann Heman and Dr [name] for review and will call back if any recommendations other than limit salt intake.    3 month ICM trend: 08/02/2018     1 Year ICM trend:       Rosalene Billings, RN 08/02/2018 4:34 PM

## 2018-08-13 DIAGNOSIS — Z1389 Encounter for screening for other disorder: Secondary | ICD-10-CM | POA: Diagnosis not present

## 2018-08-13 DIAGNOSIS — I480 Paroxysmal atrial fibrillation: Secondary | ICD-10-CM | POA: Diagnosis not present

## 2018-08-13 DIAGNOSIS — Z0000 Encounter for general adult medical examination without abnormal findings: Secondary | ICD-10-CM | POA: Diagnosis not present

## 2018-08-13 DIAGNOSIS — Z0001 Encounter for general adult medical examination with abnormal findings: Secondary | ICD-10-CM | POA: Diagnosis not present

## 2018-08-13 DIAGNOSIS — I429 Cardiomyopathy, unspecified: Secondary | ICD-10-CM | POA: Diagnosis not present

## 2018-08-13 DIAGNOSIS — Z23 Encounter for immunization: Secondary | ICD-10-CM | POA: Diagnosis not present

## 2018-08-13 DIAGNOSIS — I10 Essential (primary) hypertension: Secondary | ICD-10-CM | POA: Diagnosis not present

## 2018-08-22 ENCOUNTER — Other Ambulatory Visit (HOSPITAL_COMMUNITY): Payer: Self-pay | Admitting: Cardiology

## 2018-08-23 ENCOUNTER — Ambulatory Visit (INDEPENDENT_AMBULATORY_CARE_PROVIDER_SITE_OTHER): Payer: Medicare Other

## 2018-08-23 DIAGNOSIS — I5022 Chronic systolic (congestive) heart failure: Secondary | ICD-10-CM

## 2018-08-23 DIAGNOSIS — Z95810 Presence of automatic (implantable) cardiac defibrillator: Secondary | ICD-10-CM | POA: Diagnosis not present

## 2018-08-24 NOTE — Progress Notes (Signed)
EPIC Encounter for ICM Monitoring  Patient Name: Anita Warren is a 57 y.o. female Date: 08/24/2018 Primary Care Physican: Redmond School, MD Primary Cardiologist: [name]  Electrophysiologist: Allred Dry Weight:178lbs        Heart Failure questions reviewed, pt asymptomatic.   Thoracic impedance normal.   Prescribed: Furosemide 40 mg 1 tablettwotimes a week, Monday and Fridayper latest prescription. Potassium 20 mEq 1tablet daily.   Labs: 02/22/2018 Creatinine 1.32, BUN 20, Potassium 3.8, Sodium 139, EGFR 44-51 11/25/2017 Creatinine 1.46, BUN 20, Potassium 4.5, Sodium 136, EBFR 39-45 07/01/2017 Creatinine 1.53, BUN 15, Potassium 4.4, Sodium 139, EGFR 37-43 06/16/2017 Creatinine 1.47, BUN 17, Potassium 3.2, Sodium 139, EGFR 39-45 03/26/2017 Creatinine 1.45, BUN 16, Potassium 3.8, Sodium 138, EGFR 40-46 07/16/2016 Creatinine 1.67, BUN 26, Potassium 3.5, Sodium 140, EGFR 34-39 06/23/2016 Creatinine 1.59, BUN 18, Potassium 3.9, Sodium 139, EGFR 36-41  04/28/2016 Creatinine 2.67, BUN 30, Potassium 4.1, Sodium 135, EGFR 19-22  04/25/2016 Creatinine 1.40, BUN 19, Potassium 3.5, Sodium 137, EGFR 42-48  Recommendations: No changes.    Encouraged to call for fluid symptoms.  Follow-up plan: ICM clinic phone appointment on 09/27/2018.     Copy of ICM check sent to Dr. Rayann Heman.   3 month ICM trend: 08/23/2018    1 Year ICM trend:       Rosalene Billings, RN 08/24/2018 8:42 AM

## 2018-09-08 IMAGING — CT CT HEAD W/O CM
3 series · 16 of 47 positions shown, 19 images · non-contrast
Comparison: 04/27/2012

CLINICAL DATA: Posterior headache, photosensitivity, history of
migraines

EXAM:
CT HEAD WITHOUT CONTRAST
TECHNIQUE: Contiguous axial images were obtained from the base of the skull
through the vertex without intravenous contrast.

[Series 2: head wo · axial · 0.45mm/px · z∈[+45,+170]mm · 10 of 30 slices shown, 13 images]
[im 3/30  brain]
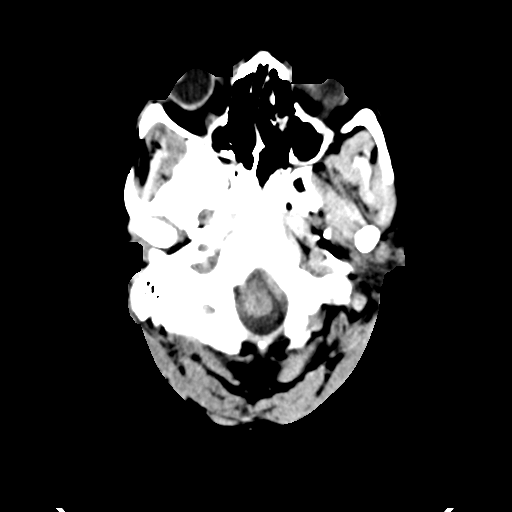
[im 3/30  bone]
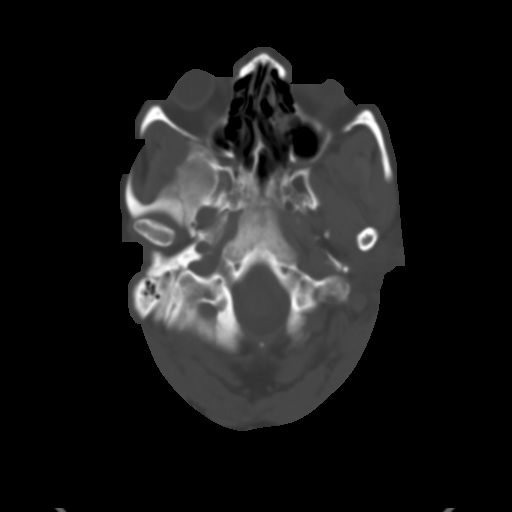
[im 6/30  brain]
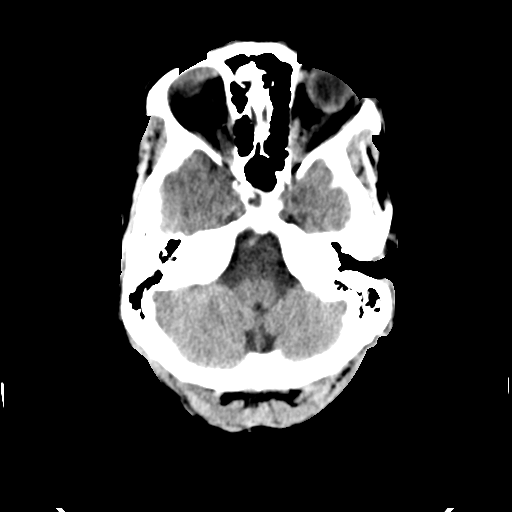
[im 9/30  brain]
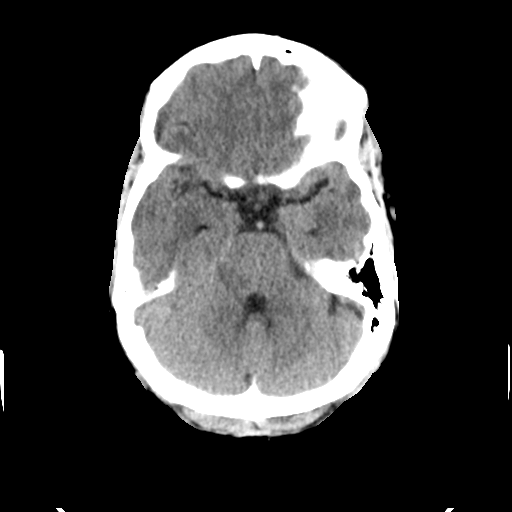
[im 11/30  brain]
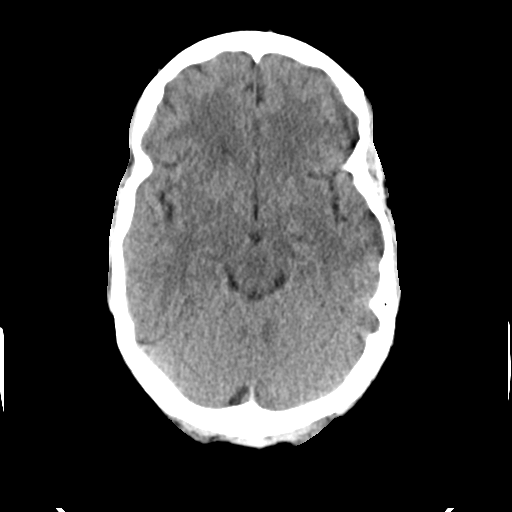
[im 14/30  brain]
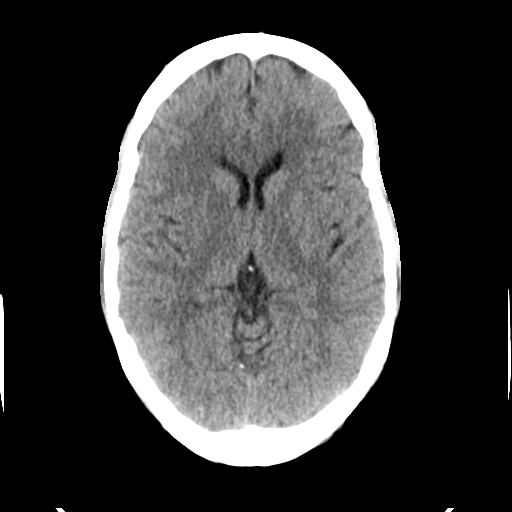
[im 14/30  bone]
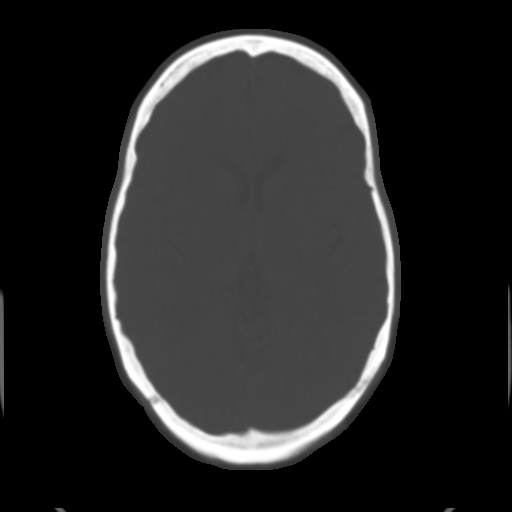
[im 17/30  brain]
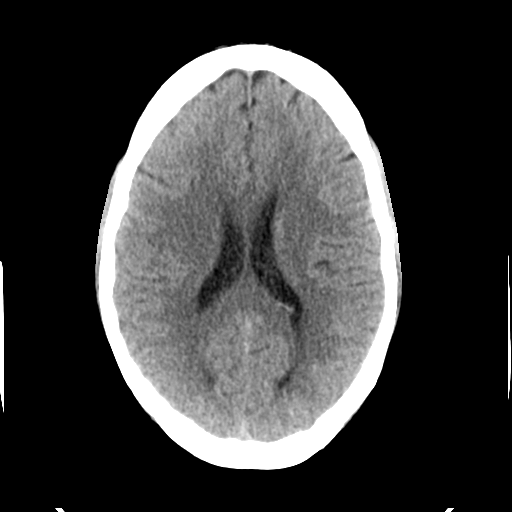
[im 20/30  brain]
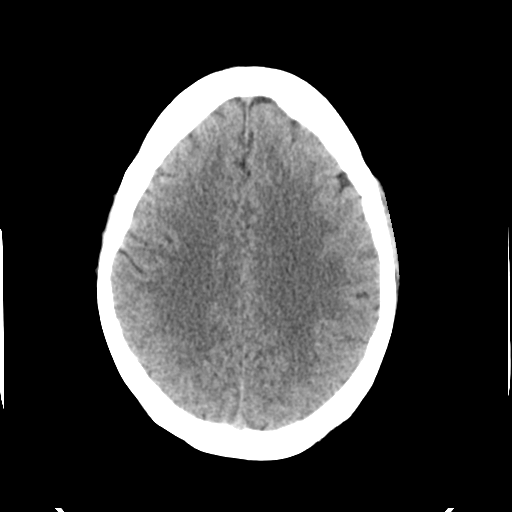
[im 23/30  brain]
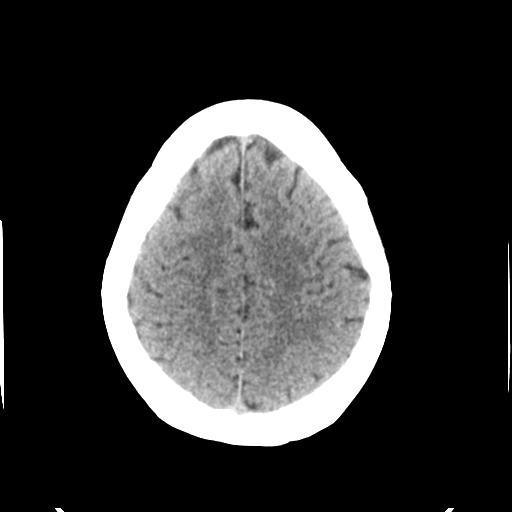
[im 25/30  brain]
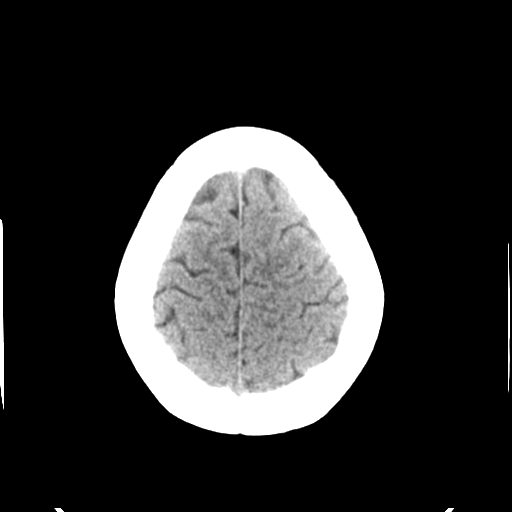
[im 25/30  bone]
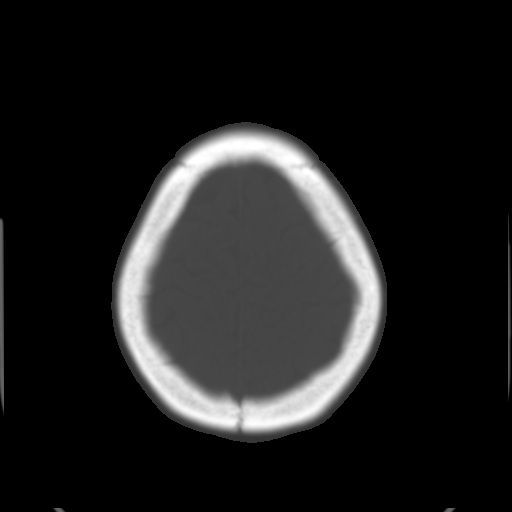
[im 28/30  brain]
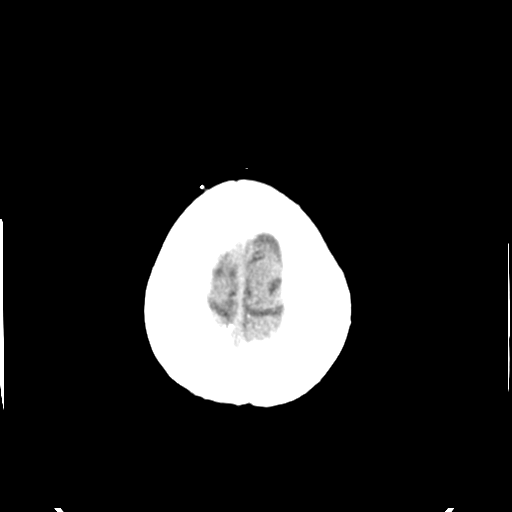

[Series 4: coronal soft tissue · coronal · 0.31mm/px · 3 of 67 slices shown]
[im 23/67  brain]
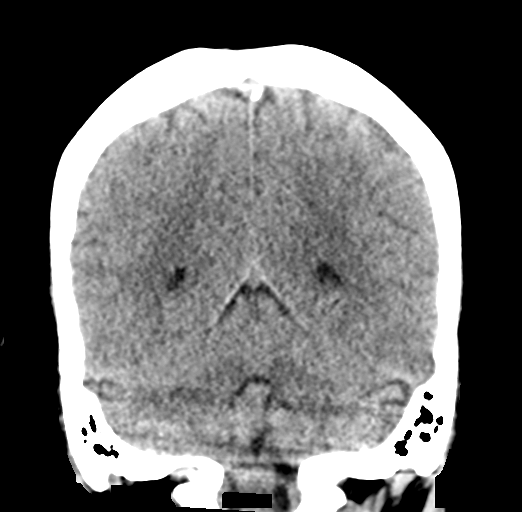
[im 30/67  brain]
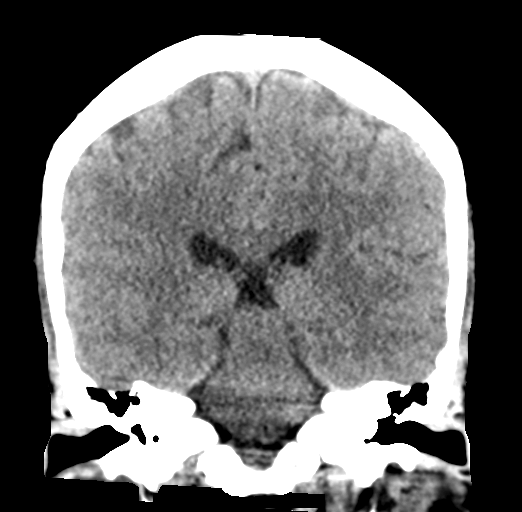
[im 37/67  brain]
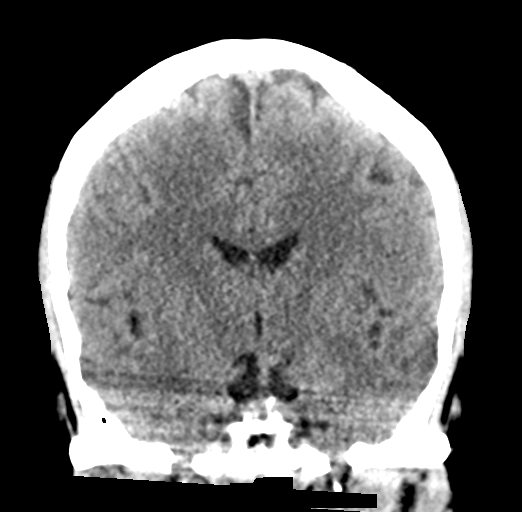

[Series 5: sagittal soft tissue · sagittal · 0.34mm/px · 3 of 56 slices shown]
[im 19/56  brain]
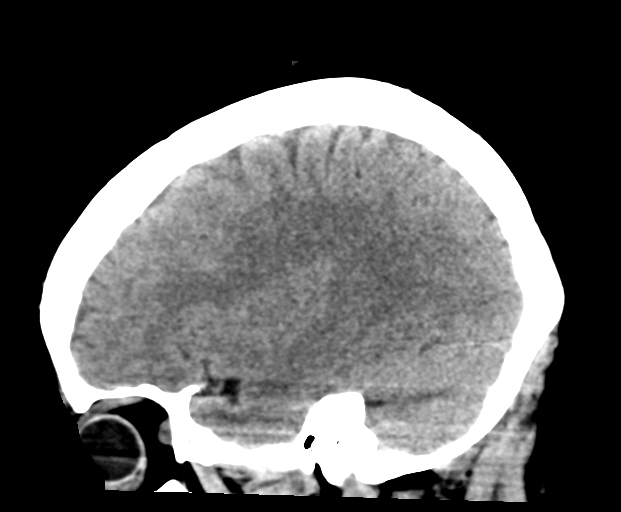
[im 28/56  brain]
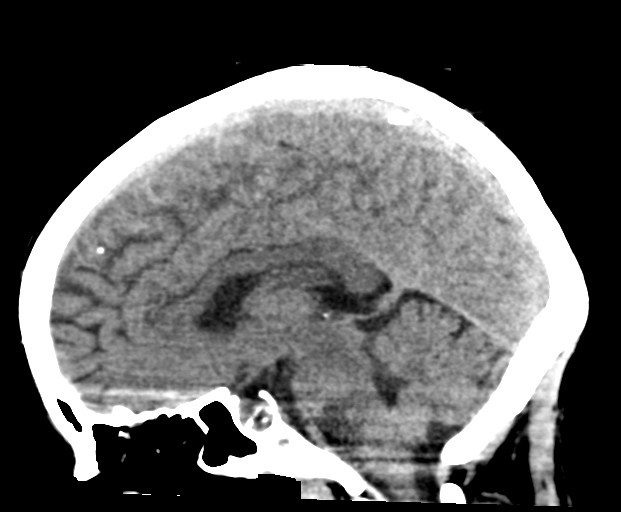
[im 37/56  brain]
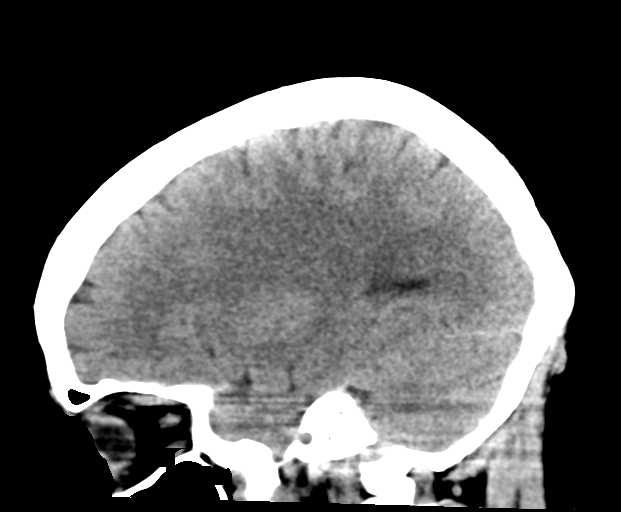

[16 of 47 positions shown; findings below may reference images not displayed]

FINDINGS: Brain: No evidence of acute infarction, hemorrhage, hydrocephalus,
extra-axial collection or mass lesion/mass effect.

Vascular: No hyperdense vessel or unexpected calcification.

Skull: Normal. Negative for fracture or focal lesion.

Sinuses/Orbits: The visualized paranasal sinuses are essentially
clear. The mastoid air cells are unopacified.

Other: None.
IMPRESSION: Normal head CT.

## 2018-09-09 ENCOUNTER — Other Ambulatory Visit (HOSPITAL_COMMUNITY): Payer: Self-pay

## 2018-09-09 MED ORDER — HYDRALAZINE HCL 100 MG PO TABS: 100.0000 mg | ORAL_TABLET | Freq: Three times a day (TID) | ORAL | 0 refills | Status: DC

## 2018-09-20 ENCOUNTER — Ambulatory Visit (HOSPITAL_COMMUNITY)
Admission: RE | Admit: 2018-09-20 | Discharge: 2018-09-20 | Disposition: A | Discharge status: Home / Self Care | Payer: Medicare Other | Source: Ambulatory Visit | Attending: Cardiology | Admitting: Cardiology

## 2018-09-20 ENCOUNTER — Other Ambulatory Visit: Payer: Self-pay

## 2018-09-20 ENCOUNTER — Ambulatory Visit (HOSPITAL_BASED_OUTPATIENT_CLINIC_OR_DEPARTMENT_OTHER)
Admission: RE | Admit: 2018-09-20 | Discharge: 2018-09-20 | Disposition: A | Discharge status: Home / Self Care | Payer: Medicare Other | Source: Ambulatory Visit | Attending: Cardiology | Admitting: Cardiology

## 2018-09-20 VITALS — BP 118/88 | HR 72 | Wt 187.8 lb

## 2018-09-20 DIAGNOSIS — Z87898 Personal history of other specified conditions: Secondary | ICD-10-CM | POA: Diagnosis not present

## 2018-09-20 DIAGNOSIS — Z95810 Presence of automatic (implantable) cardiac defibrillator: Secondary | ICD-10-CM | POA: Diagnosis not present

## 2018-09-20 DIAGNOSIS — I472 Ventricular tachycardia: Secondary | ICD-10-CM | POA: Diagnosis not present

## 2018-09-20 DIAGNOSIS — I110 Hypertensive heart disease with heart failure: Secondary | ICD-10-CM | POA: Insufficient documentation

## 2018-09-20 DIAGNOSIS — I480 Paroxysmal atrial fibrillation: Secondary | ICD-10-CM | POA: Diagnosis not present

## 2018-09-20 DIAGNOSIS — I5022 Chronic systolic (congestive) heart failure: Secondary | ICD-10-CM

## 2018-09-20 DIAGNOSIS — I313 Pericardial effusion (noninflammatory): Secondary | ICD-10-CM | POA: Insufficient documentation

## 2018-09-20 LAB — BASIC METABOLIC PANEL
ANION GAP: 7 (ref 5–15)
BUN: 23 mg/dL — ABNORMAL HIGH (ref 6–20)
CALCIUM: 9.3 mg/dL (ref 8.9–10.3)
CO2: 23 mmol/L (ref 22–32)
CREATININE: 1.49 mg/dL — AB (ref 0.44–1.00)
Chloride: 106 mmol/L (ref 98–111)
GFR, EST AFRICAN AMERICAN: 44 mL/min — AB (ref >60)
GFR, EST NON AFRICAN AMERICAN: 38 mL/min — AB (ref >60)
Glucose, Bld: 102 mg/dL — ABNORMAL HIGH (ref 70–99)
Potassium: 4 mmol/L (ref 3.5–5.1)
Sodium: 136 mmol/L (ref 135–145)

## 2018-09-20 LAB — CBC
HCT: 33.9 % — ABNORMAL LOW (ref 36.0–46.0)
Hemoglobin: 10.3 g/dL — ABNORMAL LOW (ref 12.0–15.0)
MCH: 28.7 pg (ref 26.0–34.0)
MCHC: 30.4 g/dL (ref 30.0–36.0)
MCV: 94.4 fL (ref 80.0–100.0)
NRBC: 0 % (ref 0.0–0.2)
PLATELETS: 163 10*3/uL (ref 150–400)
RBC: 3.59 MIL/uL — AB (ref 3.87–5.11)
RDW: 14.5 % (ref 11.5–15.5)
WBC: 5.4 10*3/uL (ref 4.0–10.5)

## 2018-09-20 NOTE — Progress Notes (Signed)
Patient ID: TAKESHIA Warren, female   DOB: January 05, 1961, 57 y.o.   MRN: [id number]     Advanced Heart Failure Clinic Note   Primary Physician: Glo Herring., MD  HF Cardiology: Dr. [name] EP: Dr Allred  Urology: Dr Risa Grill  HPI: Anita Warren is a 57 y.o. female with history of viral, non-ischemic cardiomyopathy, severe systolic dysfunction with EF of 10% s/p ICD pacemaker in the setting of VT 02/2011 (St. Jude);  cath 2012 with no CAD.   Admitted 7/15 with acute on chronic systolic CHF she was not taking medications for 4 months because she lost her insurance. She was diuresed with IV lasix and transitioned to lasix 40 mg daily. She was also discharged on 12.5 mg carvedilol twice a day, hydralazine 25 mg tid, and 30 mg Imdur daily. Discharge weight was 176 pounds. She was not on an ACEI due to angioedema.  Admitted from HF clinic 06/02/14 with low output heart failure. Swan placed and showed cardiogenic shock. Ultimately discharged on  Milrinone 0.25 mcg/kg/min. Hospital stay was complicated by atrial flutter. Loaded on amiodarone and started on eliquis 5 mg twice a day. Met with Dr Prescott Gum and VAD coordinator. She was titrated off the milrinone.  Echo in 8/17 showed EF up to 50%, echo in 8/18 showed EF 45%.    She presents today for 6 month follow up with Echo. Doing well overall. No problems since last visit. Not able to tolerate ACEI or ARB because of recurrent angioedema. Weight up 10 lbs since January, but had a bad cold in October and hasn't really gotten back to exercising. She denies DOE, lightheadedness or dizziness. No CP. No BRBPR or melena.   St Jude device: Corevue with thoracic impedance at baseline. No VT/VF. Personally reviewed.   05/23/14 ECHO EF 5-10% RV mod to severely dilated.  08/09/14 ECHO EF 20-25% RV read as normal 8/17 ECHO EF 50%, mild LV dilation, normal RV size and systolic function.  8/18 ECHO EF 45%, diffuse HK worse inferiorly, normal RV size and  systolic function.  11/19 ECHO EF 35-40%, mild LV dilation with diffuse hypokinesis, normal RV size with mildly decreased systolic function.   Labs:  HIV negative 2015  9/15: K 3.6 => 4.6, creatinine 0.99 => 1.5 => 1.2, Mg 1.9, HCT 45.2, LFTs normal, TSH normal 9/15: PFTs normal 07/31/14: Co-ox off milrinone 66%  K 4.5 Cr 1.36 08/23/14: k 4.4, cr 1.51 digoxin 0.7 09/20/14: K 4.9, creatinine 1.64  12/15: K 4.4, creatinine 1.36 2/16: K 4.2, creatinine 1.67, HCT 33.9 04/28/2016: K 4.1 Creatinine 2.67  8/17: TSH normal, LFTs normal 9/17: K 3.5, creatinine 1.67, hgb 9.9 5/18: hgb 10.7, K 3.8, creatinine 1.45 9/18: K 4.4, creatinine 1.53 4/19: K 3.8, creatinine 1.32, hgb 10.5  ECG (personally reviewed): NSR, nonspecific T wave flattening  2013 Colonoscopy Blood Type:  B+  Lower Extremity Doppler - Normal arterial flow Carotid Doppler-  1-39% stenosis involving the right internal carotid artery and the left internal carotid Artery.  SH: Disabled  Lives with husband and son. Has 3 grown children. Does not smoke or drink alcohol.. 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    FH: Mom died at 19 CAD         Sister and Brother HTN   Review of systems complete and found to be negative unless listed in HPI.    Past Medical History:  Diagnosis Date  . AICD (automatic cardioverter/defibrillator) present    St. Jude AICD implanted 03-07-2011 Dr.  Allred/-Dr. [name] now follows  . CHF (congestive heart failure) ([place])    meds controlling, no episodes since 2014  . Chronic systolic heart failure ([place])   . Essential hypertension, benign   . Headache   . History of kidney stones    multiple kidney stones in past  . History of medication noncompliance   . Hydronephrosis with renal and ureteral calculus obstruction 09/05/2013  . Nonischemic cardiomyopathy ([facility])    LVEF 5-10%, likely viral (no CAD by cath 01/30/11)  . NSVT (nonsustained ventricular tachycardia) ([place])   . Obesity     Current Outpatient  Medications  Medication Sig Dispense Refill  . acetaminophen (TYLENOL) 500 MG tablet Take 1,000 mg by mouth every 6 (six) hours as needed for moderate pain.    Marland Kitchen apixaban (ELIQUIS) 5 MG TABS tablet Take 1 tablet (5 mg total) by mouth 2 (two) times daily. 180 tablet 3  . carvedilol (COREG) 25 MG tablet Take 1 tablet (25 mg total) by mouth 2 (two) times daily. 60 tablet 0  . Coenzyme Q10 (CO Q 10) 100 MG CAPS Take 100 mg by mouth daily.     . hydrALAZINE (APRESOLINE) 100 MG tablet Take 1 tablet (100 mg total) by mouth 3 (three) times daily. 270 tablet 0  . isosorbide mononitrate (IMDUR) 30 MG 24 hr tablet TAKE 3 TABLETS BY MOUTH ONCE DAILY 270 tablet 1  . Magnesium Oxide 400 (240 MG) MG TABS Take 1 tablet by mouth daily. 90 tablet 3  . potassium chloride SA (K-DUR,KLOR-CON) 20 MEQ tablet TAKE 1 TABLET BY MOUTH ONCE DAILY 30 tablet 3  . spironolactone (ALDACTONE) 25 MG tablet Take 1 tablet (25 mg total) by mouth daily. 90 tablet 3  . furosemide (LASIX) 40 MG tablet TAKE 1 TABLET BY MOUTH 2 TIMES A WEEK EVERY MONDAY AND FRIDAY 36 tablet 5   No current facility-administered medications for this encounter.    PHYSICAL EXAM: Vitals:   09/20/18 1357  BP: 118/88  Pulse: 72  SpO2: 100%  Weight: 85.2 kg (187 lb 12.8 oz)    Wt Readings from Last 3 Encounters:  09/20/18 85.2 kg (187 lb 12.8 oz)  11/25/17 80.6 kg (177 lb 12.8 oz)  10/12/17 80.7 kg (178 lb)    General: Well appearing. No resp difficulty. HEENT: Normal Neck: Supple. JVP 6-7 cm. Carotids 2+ bilat; no bruits. No thyromegaly or nodule noted. Cor: PMI nondisplaced. RRR, No M/G/R noted Lungs: CTAB, normal effort. Abdomen: Soft, non-tender, non-distended, no HSM. No bruits or masses. +BS  Extremities: No cyanosis, clubbing, or rash. R and LLE no edema.  Neuro: Alert & orientedx3, cranial nerves grossly intact. moves all 4 extremities w/o difficulty. Affect pleasant   ASSESSMENT & PLAN: 1. Chronic Systolic Heart Failure: Nonischemic  cardiomyopathy thought to potentially be due to viral myocarditis noted initially in 2011. Cath 2012 normal coronaries.  St Jude ICD. Echo (7/15) with EF 5-10% and RV mod-severely dilated with moderately decreased systolic function. Repeat echo 10/15 with EF up to 20-25%. She was on milrinone briefly then stopped.  Echo in 8/17 with EF up to 50%. Repeat echo 8/18 showed EF 45% with normal RV. NYHA class II symptoms. Not volume overloaded on exam.  - Echo today with EF ~35-40% - Continue Lasix 40 mg three times weekly. BMET today and again in 3 months.  - Continue spironolactone 25 mg daily.   - No ACEI/ARB/ARNI due to recurrent episodes of angioedema with ACEI and also ARB.  - Continue Coreg  25 mg bid.   - Continue hydralazine 100 mg tid and Imdur 90 mg daily.   - Reinforced fluid restriction to < 2 L daily, sodium restriction to less than 2000 mg daily, and the importance of daily weights.    2. Atrial flutter: Paroxysmal, NSR today.  - She is now off amiodarone.  If atrial flutter recurs, should have ablation.  - Continue Eliquis 5 mg BID. Denies bleeding.  3. Angioedema: Had in past on ACEI, had another episode after ACEI stopped.  She is no longer on ACEI, ARNI, or ARB. ?Hereditary or acquired C1 inhibitor deficiency. No further problems with this recently, but no room to uptitrate medications.  4. CKD: Stage 3 - BMET today.  Cr stable 02/22/18  Shirley Friar, PA-C  09/20/2018  Patient seen with PA, agree with the above note.   Weight is up but she is not volume overloaded by exam or Corevue, think this is caloric rather than CHF. NYHA class II symptoms. Echo today was reviewed and showed EF 35-40%, diffuse hypokinesis.    She is on a good medication regimen at goal doses of Coreg, hydralazine/Imdur, and spironolactone.  Due to angioedema, she is not on ACEI/ARB/ARNI.   BMET today.    I will have her followup in 6 months.   Loralie Champagne 09/21/2018

## 2018-09-20 NOTE — Patient Instructions (Signed)
Labs today We will only contact you if something comes back abnormal or we need to make some changes. Otherwise no news is good news!  Your physician recommends that you schedule a follow-up appointment in: Dr. [name] would like you to return to clinic in 6 months. Please call in March to schedule your appointment.

## 2018-09-20 NOTE — Progress Notes (Signed)
  Echocardiogram 2D Echocardiogram has been performed.  [name] 09/20/2018, 1:40 PM

## 2018-09-27 ENCOUNTER — Ambulatory Visit (INDEPENDENT_AMBULATORY_CARE_PROVIDER_SITE_OTHER): Payer: Medicare Other

## 2018-09-27 ENCOUNTER — Telehealth: Payer: Self-pay

## 2018-09-27 DIAGNOSIS — Z95810 Presence of automatic (implantable) cardiac defibrillator: Secondary | ICD-10-CM | POA: Diagnosis not present

## 2018-09-27 DIAGNOSIS — I5022 Chronic systolic (congestive) heart failure: Secondary | ICD-10-CM | POA: Diagnosis not present

## 2018-09-27 DIAGNOSIS — I428 Other cardiomyopathies: Secondary | ICD-10-CM | POA: Diagnosis not present

## 2018-09-27 NOTE — Telephone Encounter (Signed)
LMOVM reminding pt to send remote transmission.   

## 2018-09-28 NOTE — Progress Notes (Signed)
Remote ICD transmission.   

## 2018-09-28 NOTE — Progress Notes (Signed)
EPIC Encounter for ICM Monitoring  Patient Name: Anita Warren is a 57 y.o. female Date: 09/28/2018 Primary Care Physican: Redmond School, MD Primary Cardiologist: [name]  Electrophysiologist: Allred Last Weight:178lbs Today's Weight:  unknown       Heart Failure questions reviewed, pt asymptomatic.   Thoracic impedance normal.   Prescribed: Furosemide 40 mg 1 tablettwotimes a week, Monday and Fridayper latest prescription. Potassium 20 mEq 1tablet daily.   Labs: 02/22/2018 Creatinine 1.32, BUN 20, Potassium 3.8, Sodium 139, EGFR 44-51 11/25/2017 Creatinine 1.46, BUN 20, Potassium 4.5, Sodium 136, EBFR 39-45 07/01/2017 Creatinine 1.53, BUN 15, Potassium 4.4, Sodium 139, EGFR 37-43 06/16/2017 Creatinine 1.47, BUN 17, Potassium 3.2, Sodium 139, EGFR 39-45 03/26/2017 Creatinine 1.45, BUN 16, Potassium 3.8, Sodium 138, EGFR 40-46 07/16/2016 Creatinine 1.67, BUN 26, Potassium 3.5, Sodium 140, EGFR 34-39 06/23/2016 Creatinine 1.59, BUN 18, Potassium 3.9, Sodium 139, EGFR 36-41  04/28/2016 Creatinine 2.67, BUN 30, Potassium 4.1, Sodium 135, EGFR 19-22  04/25/2016 Creatinine 1.40, BUN 19, Potassium 3.5, Sodium 137, EGFR 42-48  Recommendations: No changes.   Encouraged to call for fluid symptoms.  Follow-up plan: ICM clinic phone appointment on 10/28/2018.     Copy of ICM check sent to Dr. Rayann Heman.   3 month ICM trend: 09/28/2018    1 Year ICM trend:       Rosalene Billings, RN 09/28/2018 9:45 AM

## 2018-10-15 ENCOUNTER — Other Ambulatory Visit: Payer: Self-pay | Admitting: Cardiology

## 2018-10-21 ENCOUNTER — Other Ambulatory Visit (HOSPITAL_COMMUNITY): Payer: Self-pay

## 2018-10-21 DIAGNOSIS — I5022 Chronic systolic (congestive) heart failure: Secondary | ICD-10-CM

## 2018-10-21 MED ORDER — POTASSIUM CHLORIDE CRYS ER 20 MEQ PO TBCR: 20.0000 meq | EXTENDED_RELEASE_TABLET | Freq: Every day | ORAL | 3 refills | Status: DC

## 2018-10-28 ENCOUNTER — Ambulatory Visit (INDEPENDENT_AMBULATORY_CARE_PROVIDER_SITE_OTHER): Payer: Medicare Other

## 2018-10-28 DIAGNOSIS — I5022 Chronic systolic (congestive) heart failure: Secondary | ICD-10-CM | POA: Diagnosis not present

## 2018-10-28 DIAGNOSIS — Z95810 Presence of automatic (implantable) cardiac defibrillator: Secondary | ICD-10-CM

## 2018-10-28 NOTE — Progress Notes (Signed)
EPIC Encounter for ICM Monitoring  Patient Name: Anita Warren is a 58 y.o. female Date: 10/28/2018 Primary Care Physican: Redmond School, MD Primary Cardiologist: [name]  Electrophysiologist: Allred Last Weight:178lbs Today's Weight:  177 lbs                                                  Heart Failure questions reviewed, pt asymptomatic.  Decreased impedance due to holiday foods and increase in fluid intake.   Thoracic impedance normal.   Prescribed: Furosemide 40 mg 1 tablettwotimes a week, Monday and Fridayper latest prescription. Potassium 20 mEq 1tablet daily.   Labs: 09/20/2018 Creatinine 1.49, BUN 23, Potassium 4.0, Sodium 136, eGFR 38-44 02/22/2018 Creatinine 1.32, BUN 20, Potassium 3.8, Sodium 139, EGFR 44-51 11/25/2017 Creatinine 1.46, BUN 20, Potassium 4.5, Sodium 136, EBFR 39-45 07/01/2017 Creatinine 1.53, BUN 15, Potassium 4.4, Sodium 139, EGFR 37-43 06/16/2017 Creatinine 1.47, BUN 17, Potassium 3.2, Sodium 139, EGFR 39-45 03/26/2017 Creatinine 1.45, BUN 16, Potassium 3.8, Sodium 138, EGFR 40-46 07/16/2016 Creatinine 1.67, BUN 26, Potassium 3.5, Sodium 140, EGFR 34-39 06/23/2016 Creatinine 1.59, BUN 18, Potassium 3.9, Sodium 139, EGFR 36-41  04/28/2016 Creatinine 2.67, BUN 30, Potassium 4.1, Sodium 135, EGFR 19-22  04/25/2016 Creatinine 1.40, BUN 19, Potassium 3.5, Sodium 137, EGFR 42-48  Recommendations: No changes.   Encouraged to call for fluid symptoms.  Follow-up plan: ICM clinic phone appointment on 11/29/2018.     Copy of ICM check sent to Dr. Rayann Heman.   3 month ICM trend: 10/28/2018    1 Year ICM trend:       Rosalene Billings, RN 10/28/2018 2:41 PM

## 2018-11-14 LAB — CUP PACEART REMOTE DEVICE CHECK
Battery Remaining Longevity: 44 mo
Battery Remaining Percentage: 38 %
Battery Voltage: 2.9 V
Brady Statistic RV Percent Paced: 1 %
Date Time Interrogation Session: 20191202191255
HighPow Impedance: 37 Ohm
Implantable Lead Implant Date: 20120511
Implantable Lead Location: 753860
Implantable Lead Model: 7120
Implantable Pulse Generator Implant Date: 20120511
Lead Channel Impedance Value: 430 Ohm
Lead Channel Pacing Threshold Amplitude: 0.75 V
Lead Channel Pacing Threshold Pulse Width: 0.5 ms
Lead Channel Sensing Intrinsic Amplitude: 11.7 mV
Lead Channel Setting Pacing Amplitude: 2.5 V
Lead Channel Setting Sensing Sensitivity: 0.5 mV
MDC IDC SET LEADCHNL RV PACING PULSEWIDTH: 0.5 ms
Pulse Gen Serial Number: 634653

## 2018-11-17 ENCOUNTER — Other Ambulatory Visit (HOSPITAL_COMMUNITY): Payer: Self-pay | Admitting: Internal Medicine

## 2018-11-26 ENCOUNTER — Telehealth (HOSPITAL_COMMUNITY): Payer: Self-pay | Admitting: Licensed Clinical Social Worker

## 2018-11-26 NOTE — Telephone Encounter (Signed)
CSW received call from pt regarding Eliquis assistance.  Pt had gotten assistance from [place] last year but is not eligible until she has spent about $270 in pharmacy costs- states that he Eliquis would cost about that much at this time but she can't afford this.  CSW provided pt with 1 month of samples of Eliquis and encouraged her to bring in her [employer] [employer] application as soon as possible so we can go ahead and turn it in and confirm what pt will need to spend before she is eligible.  CSW also provided pt with number for SHIP to talk about extra assistance.  Pt states she gets about $1,300 a month but gets money taken out of that for insurance premiums- encouraged her to call SHIP to see if she can qualify to get premiums waived.  CSW will continue to follow and assist as needed.  Jorge Ny, LCSW Clinical Social Worker [facility] [phone]

## 2018-11-29 ENCOUNTER — Ambulatory Visit (INDEPENDENT_AMBULATORY_CARE_PROVIDER_SITE_OTHER): Payer: Medicare Other

## 2018-11-29 DIAGNOSIS — I5022 Chronic systolic (congestive) heart failure: Secondary | ICD-10-CM | POA: Diagnosis not present

## 2018-11-29 DIAGNOSIS — Z95810 Presence of automatic (implantable) cardiac defibrillator: Secondary | ICD-10-CM

## 2018-11-29 MED ORDER — FUROSEMIDE 40 MG PO TABS: 40.0000 mg | ORAL_TABLET | Freq: Every day | ORAL | 2 refills | Status: DC

## 2018-11-29 NOTE — Progress Notes (Signed)
Go ahead and increase Lasix to 40 mg daily for now. BMET 1 week.

## 2018-11-29 NOTE — Progress Notes (Signed)
EPIC Encounter for ICM Monitoring  Patient Name: Anita Warren is a 58 y.o. female Date: 11/29/2018 Primary Care Physican: Redmond School, MD Primary Cardiologist: [name]  Electrophysiologist: Allred LastWeight:177lbs Today's Weight: unknown  Attempted call to patient and unable to reach.   Transmission reviewed.   Thoracic impedance abnormal suggesting fluid accumulation starting 11/28/2018.  Impedance also decreased 11/15/2018 through 11/22/2018.  Prescribed:Furosemide 40 mg 1 tablettwotimes a week, Monday and Fridayper latest prescription. Potassium 20 mEq 1tablet daily.   Labs: 09/20/2018 Creatinine 1.49, BUN 23, Potassium 4.0, Sodium 136, eGFR 38-44 02/22/2018 Creatinine 1.32, BUN 20, Potassium 3.8, Sodium 139, EGFR 44-51 11/25/2017 Creatinine 1.46, BUN 20, Potassium 4.5, Sodium 136, EBFR 39-45 07/01/2017 Creatinine 1.53, BUN 15, Potassium 4.4, Sodium 139, EGFR 37-43 06/16/2017 Creatinine 1.47, BUN 17, Potassium 3.2, Sodium 139, EGFR 39-45 03/26/2017 Creatinine 1.45, BUN 16, Potassium 3.8, Sodium 138, EGFR 40-46 07/16/2016 Creatinine 1.67, BUN 26, Potassium 3.5, Sodium 140, EGFR 34-39 06/23/2016 Creatinine 1.59, BUN 18, Potassium 3.9, Sodium 139, EGFR 36-41  04/28/2016 Creatinine 2.67, BUN 30, Potassium 4.1, Sodium 135, EGFR 19-22  04/25/2016 Creatinine 1.40, BUN 19, Potassium 3.5, Sodium 137, EGFR 42-48  Recommendations: Unable to reach.  Follow-up plan: ICM clinic phone appointment on2/08/2019 to recheck fluid levels.   Copy of ICM check sent to Dr.Allred and Dr [name].  Overdue to make an appt with Dr Rayann Heman (last visit 02/18/2017).   Due to make 6 month office visit with Dr [name] 02/2019.  3 month ICM trend: 11/29/2018    1 Year ICM trend:       Rosalene Billings, RN 11/29/2018 2:03 PM

## 2018-11-29 NOTE — Progress Notes (Signed)
Spoke with patient.  She had the flu starting 1/27 and is feeling much better now.  Reviewed transmission and Dr Claris Gladden recommendation to change Lasix to 40 mg 1 tablet every day and BMET in a week.  She verbalized understanding.  She has enough Furosemide on hand and will call when refill is needed.  She agreed to lab appointment on 12/06/2018.  Advised if she feels any side effects from taking the Furosemide daily to call back.  Recheck remote transmission 12/06/2018.

## 2018-12-06 ENCOUNTER — Ambulatory Visit (HOSPITAL_COMMUNITY)
Admission: RE | Admit: 2018-12-06 | Discharge: 2018-12-06 | Disposition: A | Discharge status: Home / Self Care | Payer: Medicare Other | Source: Ambulatory Visit | Attending: Cardiology | Admitting: Cardiology

## 2018-12-06 DIAGNOSIS — I5022 Chronic systolic (congestive) heart failure: Secondary | ICD-10-CM | POA: Insufficient documentation

## 2018-12-06 LAB — BASIC METABOLIC PANEL
Anion gap: 8 (ref 5–15)
BUN: 17 mg/dL (ref 6–20)
CHLORIDE: 106 mmol/L (ref 98–111)
CO2: 24 mmol/L (ref 22–32)
Calcium: 9.2 mg/dL (ref 8.9–10.3)
Creatinine, Ser: 1.36 mg/dL — ABNORMAL HIGH (ref 0.44–1.00)
GFR calc Af Amer: 50 mL/min — ABNORMAL LOW (ref >60)
GFR calc non Af Amer: 43 mL/min — ABNORMAL LOW (ref >60)
GLUCOSE: 105 mg/dL — AB (ref 70–99)
Potassium: 4.2 mmol/L (ref 3.5–5.1)
Sodium: 138 mmol/L (ref 135–145)

## 2018-12-07 ENCOUNTER — Ambulatory Visit (INDEPENDENT_AMBULATORY_CARE_PROVIDER_SITE_OTHER): Payer: Medicare Other

## 2018-12-07 DIAGNOSIS — Z95810 Presence of automatic (implantable) cardiac defibrillator: Secondary | ICD-10-CM

## 2018-12-07 DIAGNOSIS — I5022 Chronic systolic (congestive) heart failure: Secondary | ICD-10-CM

## 2018-12-07 NOTE — Progress Notes (Signed)
EPIC Encounter for ICM Monitoring  Patient Name: Anita Warren is a 58 y.o. female Date: 12/07/2018 Primary Care Physican: Redmond School, MD Primary Cardiologist: [name]  Electrophysiologist: Allred LastWeight:177lbs Today's Weight:177 lb       Heart Failure questions reviewed.  Pt reported feeling better since Furosemide was increased.    Report: Thoracic impedance returned to normal after Furosemide maintenance dose increased to 40 mg daily on 11/29/2018 by Dr [name].  Prescribed: Furosemide 40 mg 1 tablet daily (increased 11/29/2018). Potassium 20 mEq 1tablet daily.   Labs: 12/06/2018 Creatinine 1.36, BUN 17, Potassium 4.2, Sodium 138, GFR 43-50 09/20/2018 Creatinine 1.49, BUN 23, Potassium 4.0, Sodium 136, GFR 38-44 02/22/2018 Creatinine 1.32, BUN 20, Potassium 3.8, Sodium 139, GFR 44-51 11/25/2017 Creatinine 1.46, BUN 20, Potassium 4.5, Sodium 136, GFR 39-45 07/01/2017 Creatinine 1.53, BUN 15, Potassium 4.4, Sodium 139, GFR 37-43 06/16/2017 Creatinine 1.47, BUN 17, Potassium 3.2, Sodium 139, GFR 39-45 03/26/2017 Creatinine 1.45, BUN 16, Potassium 3.8, Sodium 138, GFR 40-46 07/16/2016 Creatinine 1.67, BUN 26, Potassium 3.5, Sodium 140, GFR 34-39 06/23/2016 Creatinine 1.59, BUN 18, Potassium 3.9, Sodium 139, GFR 36-41  04/28/2016 Creatinine 2.67, BUN 30, Potassium 4.1, Sodium 135, GFR 19-22  04/25/2016 Creatinine 1.40, BUN 19, Potassium 3.5, Sodium 137, GFR 42-48  Recommendations: No changes.   Encouraged to call for fluid symptoms.  Follow-up plan: ICM clinic phone appointment on 01/03/2019.   Overdue to make an appt with Dr Rayann Heman (last visit 02/18/2017).   Due to make 6 month office visit with Dr [name] 02/2019.  Copy of ICM check sent to Dr. Rayann Heman.  3 month ICM trend: 12/07/2018    1 Year ICM trend:       Rosalene Billings, RN 12/07/2018 12:20 PM

## 2018-12-27 ENCOUNTER — Ambulatory Visit (INDEPENDENT_AMBULATORY_CARE_PROVIDER_SITE_OTHER): Payer: Medicare Other | Admitting: *Deleted

## 2018-12-27 DIAGNOSIS — I428 Other cardiomyopathies: Secondary | ICD-10-CM | POA: Diagnosis not present

## 2018-12-27 DIAGNOSIS — I5022 Chronic systolic (congestive) heart failure: Secondary | ICD-10-CM

## 2018-12-28 ENCOUNTER — Telehealth: Payer: Self-pay

## 2018-12-28 NOTE — Telephone Encounter (Signed)
Left message for patient to remind of missed remote transmission.  

## 2018-12-29 LAB — CUP PACEART REMOTE DEVICE CHECK
Battery Remaining Longevity: 42 mo
Battery Remaining Percentage: 36 %
Battery Voltage: 2.89 V
Brady Statistic RV Percent Paced: 1 %
Date Time Interrogation Session: 20200303144615
HighPow Impedance: 36 Ohm
Implantable Lead Implant Date: 20120511
Implantable Lead Location: 753860
Implantable Lead Model: 7120
Implantable Pulse Generator Implant Date: 20120511
Lead Channel Impedance Value: 400 Ohm
Lead Channel Pacing Threshold Amplitude: 0.75 V
Lead Channel Pacing Threshold Pulse Width: 0.5 ms
Lead Channel Sensing Intrinsic Amplitude: 10.4 mV
Lead Channel Setting Pacing Amplitude: 2.5 V
Lead Channel Setting Pacing Pulse Width: 0.5 ms
Lead Channel Setting Sensing Sensitivity: 0.5 mV
Pulse Gen Serial Number: 634653

## 2019-01-03 ENCOUNTER — Ambulatory Visit (INDEPENDENT_AMBULATORY_CARE_PROVIDER_SITE_OTHER): Payer: Medicare Other

## 2019-01-03 DIAGNOSIS — Z95810 Presence of automatic (implantable) cardiac defibrillator: Secondary | ICD-10-CM | POA: Diagnosis not present

## 2019-01-03 DIAGNOSIS — I5022 Chronic systolic (congestive) heart failure: Secondary | ICD-10-CM | POA: Diagnosis not present

## 2019-01-03 NOTE — Progress Notes (Signed)
Remote ICD transmission.   

## 2019-01-04 NOTE — Progress Notes (Signed)
EPIC Encounter for ICM Monitoring  Patient Name: Anita Warren is a 58 y.o. female Date: 01/04/2019 Primary Care Physican: Redmond School, MD Primary Cardiologist: [name]  Electrophysiologist: Allred LastWeight:177lbs Today's Weight:177 lb                                                           Heart Failure questions reviewed.  Pt is asymptomatic.   Report: Thoracic impedance normal but was abnormal from 12/15/2018 through 12/29/2018.  Per Direct Trend viewer, impedance returned to baseline 12/29/2018.  Prescribed: Furosemide 40 mg 1 tablet daily (increased 11/29/2018). Potassium 20 mEq 1tablet daily.   Labs: 12/06/2018 Creatinine 1.36, BUN 17, Potassium 4.2, Sodium 138, GFR 43-50 09/20/2018 Creatinine 1.49, BUN 23, Potassium 4.0, Sodium 136, GFR 38-44 02/22/2018 Creatinine 1.32, BUN 20, Potassium 3.8, Sodium 139, GFR 44-51 11/25/2017 Creatinine 1.46, BUN 20, Potassium 4.5, Sodium 136, GFR 39-45 07/01/2017 Creatinine 1.53, BUN 15, Potassium 4.4, Sodium 139, GFR 37-43 06/16/2017 Creatinine 1.47, BUN 17, Potassium 3.2, Sodium 139, GFR 39-45 03/26/2017 Creatinine 1.45, BUN 16, Potassium 3.8, Sodium 138, GFR 40-46 07/16/2016 Creatinine 1.67, BUN 26, Potassium 3.5, Sodium 140, GFR 34-39 06/23/2016 Creatinine 1.59, BUN 18, Potassium 3.9, Sodium 139, GFR 36-41  04/28/2016 Creatinine 2.67, BUN 30, Potassium 4.1, Sodium 135, GFR 19-22  04/25/2016 Creatinine 1.40, BUN 19, Potassium 3.5, Sodium 137, GFR 42-48  Recommendations: No changes.   Encouraged to call for fluid symptoms.  Follow-up plan: ICM clinic phone appointment on 02/07/2019.   Office appt with Dr Rayann Heman 04/08/2019. Due to make 6 month office visit with Dr [name] 02/2019.  Copy of ICM check sent to Dr. Rayann Heman.  Direct Trend Viewer through 01/01/2019   3 month ICM trend: 12/28/2018    1 Year ICM trend:       Rosalene Billings, RN 01/04/2019 9:01 AM

## 2019-01-18 ENCOUNTER — Other Ambulatory Visit (HOSPITAL_COMMUNITY): Payer: Self-pay | Admitting: Internal Medicine

## 2019-02-07 ENCOUNTER — Other Ambulatory Visit: Payer: Self-pay

## 2019-02-07 ENCOUNTER — Ambulatory Visit (INDEPENDENT_AMBULATORY_CARE_PROVIDER_SITE_OTHER): Payer: Medicare Other

## 2019-02-07 DIAGNOSIS — I5022 Chronic systolic (congestive) heart failure: Secondary | ICD-10-CM | POA: Diagnosis not present

## 2019-02-07 DIAGNOSIS — Z95810 Presence of automatic (implantable) cardiac defibrillator: Secondary | ICD-10-CM | POA: Diagnosis not present

## 2019-02-11 ENCOUNTER — Telehealth: Payer: Self-pay

## 2019-02-11 NOTE — Progress Notes (Signed)
Take Lasix 40 qam/20 qpm x 3 days then back to 40 daily.

## 2019-02-11 NOTE — Progress Notes (Signed)
Call to patient.  Advised Dr [name] ordered to take Lasix 40 qam/20 qpm x 3 days then back to 40 daily. She verbalized understanding of instructions.

## 2019-02-11 NOTE — Progress Notes (Signed)
EPIC Encounter for ICM Monitoring  Patient Name: Anita Warren is a 58 y.o. female Date: 02/11/2019 Primary Care Physican: Redmond School, MD Primary Cardiologist: [name]  Electrophysiologist: Allred LastWeight:177lbs 02/11/2019 Weight:177 lb   Heart Failure questions reviewed. Pt is asymptomatic.  She thinks she has been drinking too much fluids in the last couple of days.  Report: Thoracic impedanceabnormal suggesting fluid accumulation since 02/09/2019.  Prescribed:Furosemide 40 mg 1 tabletdaily (increased 11/29/2018). Potassium 20 mEq 1tablet daily.   Labs: 12/06/2018 Creatinine 1.36, BUN 17, Potassium 4.2, Sodium 138, GFR 43-50 09/20/2018 Creatinine 1.49, BUN 23, Potassium 4.0, Sodium 136, GFR 38-44 02/22/2018 Creatinine 1.32, BUN 20, Potassium 3.8, Sodium 139, GFR 44-51 11/25/2017 Creatinine 1.46, BUN 20, Potassium 4.5, Sodium 136,GFR 39-45 07/01/2017 Creatinine 1.53, BUN 15, Potassium 4.4, Sodium 139, GFR 37-43 06/16/2017 Creatinine 1.47, BUN 17, Potassium 3.2, Sodium 139, GFR 39-45 03/26/2017 Creatinine 1.45, BUN 16, Potassium 3.8, Sodium 138, GFR 40-46 07/16/2016 Creatinine 1.67, BUN 26, Potassium 3.5, Sodium 140, GFR 34-39 06/23/2016 Creatinine 1.59, BUN 18, Potassium 3.9, Sodium 139, GFR 36-41  04/28/2016 Creatinine 2.67, BUN 30, Potassium 4.1, Sodium 135, GFR 19-22  04/25/2016 Creatinine 1.40, BUN 19, Potassium 3.5, Sodium 137, GFR 42-48  Recommendations: Advised to limit fluid intake to 64 oz daily.  Follow-up plan: ICM clinic phone appointment on4/27/2020 to recheck fluid levels.  Copy of ICM check sent to Dr.Allred and Dr [name] for review and if any recommendations will call back.  3 month ICM trend: 02/11/2019    1 Year ICM trend:       Rosalene Billings, RN 02/11/2019 10:57 AM

## 2019-02-11 NOTE — Telephone Encounter (Signed)
Spoke with patient to remind of missed remote transmission 

## 2019-02-17 ENCOUNTER — Other Ambulatory Visit (HOSPITAL_COMMUNITY): Payer: Self-pay | Admitting: Cardiology

## 2019-02-17 ENCOUNTER — Other Ambulatory Visit (HOSPITAL_COMMUNITY): Payer: Self-pay | Admitting: Internal Medicine

## 2019-02-21 ENCOUNTER — Other Ambulatory Visit: Payer: Self-pay

## 2019-02-21 ENCOUNTER — Ambulatory Visit (INDEPENDENT_AMBULATORY_CARE_PROVIDER_SITE_OTHER): Payer: Medicare Other

## 2019-02-21 ENCOUNTER — Telehealth: Payer: Self-pay

## 2019-02-21 DIAGNOSIS — I5022 Chronic systolic (congestive) heart failure: Secondary | ICD-10-CM

## 2019-02-21 DIAGNOSIS — Z95810 Presence of automatic (implantable) cardiac defibrillator: Secondary | ICD-10-CM

## 2019-02-21 NOTE — Telephone Encounter (Signed)
Left message for patient to remind of missed remote transmission.  

## 2019-02-22 ENCOUNTER — Telehealth: Payer: Self-pay

## 2019-02-22 NOTE — Progress Notes (Signed)
EPIC Encounter for ICM Monitoring  Patient Name: Anita Warren is a 58 y.o. female Date: 02/22/2019 Primary Care Physican: Redmond School, MD Primary Cardiologist: [name]  Electrophysiologist: Allred 02/11/2019 Weight:177 lb 02/22/2019 Weight: unknown   Attempted call to patient and unable to reach.  Left message to return call. Transmission reviewed.   Report: Thoracic impedance returned to normal since 02/11/2019 and Furosemide was increased x 3 days.   Prescribed:Furosemide 40 mg 1 tabletdaily. Potassium 20 mEq 1tablet daily.   Labs: 12/06/2018 Creatinine 1.36, BUN 17, Potassium 4.2, Sodium 138, GFR 43-50 09/20/2018 Creatinine 1.49, BUN 23, Potassium 4.0, Sodium 136, GFR 38-44 02/22/2018 Creatinine 1.32, BUN 20, Potassium 3.8, Sodium 139, GFR 44-51  Recommendations: Unable to reach.  Follow-up plan: ICM clinic phone appointment on5/19/2020.  Copy of ICM check sent to Dr.Allred.  3 month ICM trend: 02/21/2019    1 Year ICM trend:       Rosalene Billings, RN 02/22/2019 8:24 AM

## 2019-02-22 NOTE — Telephone Encounter (Signed)
Remote ICM transmission received.  Attempted call to patient regarding ICM remote transmission and left message to return call   

## 2019-03-02 ENCOUNTER — Other Ambulatory Visit (HOSPITAL_COMMUNITY): Payer: Self-pay | Admitting: Cardiology

## 2019-03-02 MED ORDER — APIXABAN 5 MG PO TABS: 5.0000 mg | ORAL_TABLET | Freq: Two times a day (BID) | ORAL | 3 refills | Status: DC

## 2019-03-15 ENCOUNTER — Other Ambulatory Visit: Payer: Self-pay

## 2019-03-15 ENCOUNTER — Telehealth: Payer: Self-pay

## 2019-03-15 ENCOUNTER — Ambulatory Visit (INDEPENDENT_AMBULATORY_CARE_PROVIDER_SITE_OTHER): Payer: Medicare Other

## 2019-03-15 DIAGNOSIS — I5022 Chronic systolic (congestive) heart failure: Secondary | ICD-10-CM | POA: Diagnosis not present

## 2019-03-15 DIAGNOSIS — Z95810 Presence of automatic (implantable) cardiac defibrillator: Secondary | ICD-10-CM

## 2019-03-15 NOTE — Telephone Encounter (Signed)
Left message for patient to remind of missed remote transmission.  

## 2019-03-15 NOTE — Progress Notes (Signed)
EPIC Encounter for ICM Monitoring  Patient Name: Anita Warren is a 58 y.o. female Date: 03/15/2019 Primary Care Physican: Redmond School, MD Primary Cardiologist: [name]  Electrophysiologist: Allred 4/17/2020Weight:177 lb 02/22/2019 Weight: unknown   Spoke with patient and she feels bloated and congested in chest.   Report: Thoracic impedance abnormal since 03/12/2019 suggestive of fluid accumulation.   Prescribed:Furosemide 40 mg 1 tabletdaily. Potassium 20 mEq 1tablet daily.   Labs: 12/06/2018 Creatinine 1.36, BUN 17, Potassium 4.2, Sodium 138, GFR 43-50 09/20/2018 Creatinine 1.49, BUN 23, Potassium 4.0, Sodium 136, GFR 38-44 02/22/2018 Creatinine 1.32, BUN 20, Potassium 3.8, Sodium 139, GFR 44-51  Recommendations: Advised to limit salt.  Will call back with any recommendations.    Follow-up plan: ICM clinic phone appointment on5/26/2020 to recheck fluid levels.  Copy of ICM check sent to Dr.Allred and Dr [name] for review and recommendation  3 month ICM trend: 03/15/2019    1 Year ICM trend:       Rosalene Billings, RN 03/15/2019 3:12 PM

## 2019-03-15 NOTE — Progress Notes (Signed)
Increase Lasix ot 40 mg bid x 3 days then back to 40 daily.

## 2019-03-16 DIAGNOSIS — G43909 Migraine, unspecified, not intractable, without status migrainosus: Secondary | ICD-10-CM | POA: Diagnosis not present

## 2019-03-16 DIAGNOSIS — I10 Essential (primary) hypertension: Secondary | ICD-10-CM | POA: Diagnosis not present

## 2019-03-16 DIAGNOSIS — Z0001 Encounter for general adult medical examination with abnormal findings: Secondary | ICD-10-CM | POA: Diagnosis not present

## 2019-03-16 DIAGNOSIS — Z1389 Encounter for screening for other disorder: Secondary | ICD-10-CM | POA: Diagnosis not present

## 2019-03-16 NOTE — Progress Notes (Signed)
Spoke with patient and provided Dr Claris Gladden recommendations to increase Lasix to 40 mg bid x 3 days and then back to 40 mg daily.  She verbalized understanding.  Will recheck fluid levels on 03/22/2019.

## 2019-03-22 ENCOUNTER — Other Ambulatory Visit: Payer: Self-pay

## 2019-03-22 ENCOUNTER — Ambulatory Visit (INDEPENDENT_AMBULATORY_CARE_PROVIDER_SITE_OTHER): Payer: Medicare Other

## 2019-03-22 ENCOUNTER — Other Ambulatory Visit (HOSPITAL_COMMUNITY): Payer: Self-pay | Admitting: Cardiology

## 2019-03-22 ENCOUNTER — Telehealth: Payer: Self-pay

## 2019-03-22 DIAGNOSIS — Z95810 Presence of automatic (implantable) cardiac defibrillator: Secondary | ICD-10-CM

## 2019-03-22 DIAGNOSIS — I5022 Chronic systolic (congestive) heart failure: Secondary | ICD-10-CM

## 2019-03-22 NOTE — Telephone Encounter (Signed)
Left message for patient to remind of missed remote transmission.  

## 2019-03-23 NOTE — Progress Notes (Signed)
EPIC Encounter for ICM Monitoring  Patient Name: Anita Warren is a 58 y.o. female Date: 03/23/2019 Primary Care Physican: Redmond School, MD Primary Cardiologist: [name]  Electrophysiologist: Allred 4/17/2020Weight:177 lb 03/23/2019 Weight:172 lbs   Spoke with patient and she feels great.    Corvue Thoracic impedance returned to normal since 03/15/2019 transmission.  Prescribed:Furosemide 40 mg 1 tabletdaily. Potassium 20 mEq 1tablet daily.   Labs: 12/06/2018 Creatinine 1.36, BUN 17, Potassium 4.2, Sodium 138, GFR 43-50 09/20/2018 Creatinine 1.49, BUN 23, Potassium 4.0, Sodium 136, GFR 38-44 02/22/2018 Creatinine 1.32, BUN 20, Potassium 3.8, Sodium 139, GFR 44-51  Recommendations:   Encouraged to call if experiences any fluid symptoms.  Follow-up plan: ICM clinic phone appointment on6/22/2020.Office appointment with Dr Rayann Heman 04/08/2019.  Copy of ICM check sent to Dr.Allred.   3 month ICM trend: 03/23/2019    1 Year ICM trend:       Rosalene Billings, RN 03/23/2019 8:11 AM

## 2019-03-28 ENCOUNTER — Ambulatory Visit (INDEPENDENT_AMBULATORY_CARE_PROVIDER_SITE_OTHER): Payer: Medicare Other | Admitting: *Deleted

## 2019-03-28 DIAGNOSIS — I428 Other cardiomyopathies: Secondary | ICD-10-CM | POA: Diagnosis not present

## 2019-03-29 ENCOUNTER — Telehealth: Payer: Self-pay

## 2019-03-29 NOTE — Telephone Encounter (Signed)
Left message for patient to remind of missed remote transmission.  

## 2019-03-30 LAB — CUP PACEART REMOTE DEVICE CHECK
Battery Remaining Longevity: 40 mo
Battery Remaining Percentage: 34 %
Battery Voltage: 2.89 V
Brady Statistic RV Percent Paced: 1 %
Date Time Interrogation Session: 20200603041409
HighPow Impedance: 37 Ohm
Implantable Lead Implant Date: 20120511
Implantable Lead Location: 753860
Implantable Lead Model: 7120
Implantable Pulse Generator Implant Date: 20120511
Lead Channel Impedance Value: 430 Ohm
Lead Channel Pacing Threshold Amplitude: 0.75 V
Lead Channel Pacing Threshold Pulse Width: 0.5 ms
Lead Channel Sensing Intrinsic Amplitude: 11.7 mV
Lead Channel Setting Pacing Amplitude: 2.5 V
Lead Channel Setting Pacing Pulse Width: 0.5 ms
Lead Channel Setting Sensing Sensitivity: 0.5 mV
Pulse Gen Serial Number: 634653

## 2019-04-04 ENCOUNTER — Encounter: Payer: Self-pay | Admitting: Cardiology

## 2019-04-04 NOTE — Progress Notes (Signed)
Remote ICD transmission.   

## 2019-04-08 ENCOUNTER — Telehealth: Payer: Medicare Other | Admitting: Internal Medicine

## 2019-04-18 ENCOUNTER — Telehealth: Payer: Self-pay

## 2019-04-18 ENCOUNTER — Ambulatory Visit (INDEPENDENT_AMBULATORY_CARE_PROVIDER_SITE_OTHER): Payer: Medicare Other

## 2019-04-18 ENCOUNTER — Other Ambulatory Visit (HOSPITAL_COMMUNITY): Payer: Self-pay

## 2019-04-18 DIAGNOSIS — I5022 Chronic systolic (congestive) heart failure: Secondary | ICD-10-CM | POA: Diagnosis not present

## 2019-04-18 DIAGNOSIS — Z95810 Presence of automatic (implantable) cardiac defibrillator: Secondary | ICD-10-CM

## 2019-04-18 MED ORDER — POTASSIUM CHLORIDE CRYS ER 20 MEQ PO TBCR: 20.0000 meq | EXTENDED_RELEASE_TABLET | Freq: Every day | ORAL | 3 refills | Status: DC

## 2019-04-18 NOTE — Telephone Encounter (Signed)
Left message for patient to remind of missed remote transmission.  

## 2019-04-20 NOTE — Progress Notes (Signed)
EPIC Encounter for ICM Monitoring  Patient Name: SHANAYE RIEF is a 58 y.o. female Date: 04/20/2019 Primary Care Physican: Redmond School, MD Primary Cardiologist: [name]  Electrophysiologist: Allred 4/17/2020Weight:177 lb 03/23/2019 Weight:172 lbs   Spoke with patient and shefeels great. Walking a mile daily  Corvue Thoracic impedance normal.  Prescribed:Furosemide 40 mg 1 tabletdaily. Potassium 20 mEq 1tablet daily.   Labs: 12/06/2018 Creatinine 1.36, BUN 17, Potassium 4.2, Sodium 138, GFR 43-50 09/20/2018 Creatinine 1.49, BUN 23, Potassium 4.0, Sodium 136, GFR 38-44 02/22/2018 Creatinine 1.32, BUN 20, Potassium 3.8, Sodium 139, GFR 44-51  Recommendations: Encouraged to call if experiences any fluid symptoms.  Follow-up plan: ICM clinic phone appointment on7/27/2020.Office appointment with Dr Rayann Heman 06/27/2019.  Copy of ICM check sent to Dr.Allred.   3 month ICM trend: 04/18/2019    1 Year ICM trend:       Rosalene Billings, RN 04/20/2019 12:11 PM

## 2019-04-29 ENCOUNTER — Other Ambulatory Visit (HOSPITAL_COMMUNITY): Payer: Self-pay | Admitting: Cardiology

## 2019-05-18 ENCOUNTER — Other Ambulatory Visit (HOSPITAL_COMMUNITY): Payer: Self-pay | Admitting: Cardiology

## 2019-05-23 ENCOUNTER — Ambulatory Visit (INDEPENDENT_AMBULATORY_CARE_PROVIDER_SITE_OTHER): Payer: Medicare Other

## 2019-05-23 DIAGNOSIS — I5022 Chronic systolic (congestive) heart failure: Secondary | ICD-10-CM

## 2019-05-23 DIAGNOSIS — Z95810 Presence of automatic (implantable) cardiac defibrillator: Secondary | ICD-10-CM

## 2019-05-24 ENCOUNTER — Telehealth: Payer: Self-pay

## 2019-05-24 NOTE — Telephone Encounter (Signed)
Left message for patient to remind of missed remote transmission.  

## 2019-05-25 ENCOUNTER — Telehealth: Payer: Self-pay

## 2019-05-25 NOTE — Progress Notes (Signed)
EPIC Encounter for ICM Monitoring  Patient Name: Anita Warren is a 58 y.o. female Date: 05/25/2019 Primary Care Physican: Redmond School, MD Primary Cardiologist: [name]  Electrophysiologist: Allred 03/23/2019 Weight:172 lbs 05/25/2019 Weight: unknown   Attempted call to patient and unable to reach.  Left message to return call. Transmission reviewed.   CorvueThoracic impedancewas suggestive of possible fluid accumulation since 05/20/2019 but returned to baseline 05/25/2019.  Prescribed:Furosemide 40 mg 1 tabletdaily. Potassium 20 mEq 1tablet daily.   Labs: 12/06/2018 Creatinine 1.36, BUN 17, Potassium 4.2, Sodium 138, GFR 43-50 09/20/2018 Creatinine 1.49, BUN 23, Potassium 4.0, Sodium 136, GFR 38-44 02/22/2018 Creatinine 1.32, BUN 20, Potassium 3.8, Sodium 139, GFR 44-51  Recommendations:Unable to reach.    Follow-up plan: ICM clinic phone appointment on9/10/2018.Office appointment with Dr Rayann Heman 07/01/2019.  Copy of ICM check sent to Dr.Allred.    Direct Trend View   3 month ICM trend: 05/25/2019    1 Year ICM trend:       Rosalene Billings, RN 05/25/2019 1:56 PM

## 2019-05-25 NOTE — Progress Notes (Signed)
Spoke with patient. She said she is feeling fine but did feeling tired this past weekend.  She has felt fine today and yesterday.  No changes today and encouraged to call if she experiences any fluid symptoms.

## 2019-05-25 NOTE — Telephone Encounter (Signed)
Remote ICM transmission received.  Attempted call to patient regarding ICM remote transmission and left message to return call   

## 2019-06-03 ENCOUNTER — Telehealth: Payer: Medicare Other | Admitting: Internal Medicine

## 2019-06-26 ENCOUNTER — Other Ambulatory Visit (HOSPITAL_COMMUNITY): Payer: Self-pay | Admitting: Cardiology

## 2019-06-27 ENCOUNTER — Ambulatory Visit (INDEPENDENT_AMBULATORY_CARE_PROVIDER_SITE_OTHER): Payer: Medicare Other | Admitting: *Deleted

## 2019-06-27 DIAGNOSIS — I428 Other cardiomyopathies: Secondary | ICD-10-CM

## 2019-06-27 LAB — CUP PACEART REMOTE DEVICE CHECK
Battery Remaining Longevity: 40 mo
Battery Remaining Percentage: 33 %
Battery Voltage: 2.87 V
Brady Statistic RV Percent Paced: 1 %
Date Time Interrogation Session: 20200831122221
HighPow Impedance: 39 Ohm
Implantable Lead Implant Date: 20120511
Implantable Lead Location: 753860
Implantable Lead Model: 7120
Implantable Pulse Generator Implant Date: 20120511
Lead Channel Impedance Value: 430 Ohm
Lead Channel Pacing Threshold Amplitude: 0.75 V
Lead Channel Pacing Threshold Pulse Width: 0.5 ms
Lead Channel Sensing Intrinsic Amplitude: 11.7 mV
Lead Channel Setting Pacing Amplitude: 2.5 V
Lead Channel Setting Pacing Pulse Width: 0.5 ms
Lead Channel Setting Sensing Sensitivity: 0.5 mV
Pulse Gen Serial Number: 634653

## 2019-06-28 ENCOUNTER — Ambulatory Visit (INDEPENDENT_AMBULATORY_CARE_PROVIDER_SITE_OTHER): Payer: Medicare Other

## 2019-06-28 DIAGNOSIS — I5022 Chronic systolic (congestive) heart failure: Secondary | ICD-10-CM

## 2019-06-28 DIAGNOSIS — Z95810 Presence of automatic (implantable) cardiac defibrillator: Secondary | ICD-10-CM

## 2019-07-01 ENCOUNTER — Encounter: Payer: Self-pay | Admitting: Internal Medicine

## 2019-07-01 ENCOUNTER — Ambulatory Visit: Payer: Medicare Other | Admitting: Internal Medicine

## 2019-07-01 ENCOUNTER — Other Ambulatory Visit: Payer: Self-pay

## 2019-07-01 VITALS — BP 116/68 | HR 71 | Temp 98.2°F | Ht 63.0 in | Wt 192.0 lb

## 2019-07-01 DIAGNOSIS — I10 Essential (primary) hypertension: Secondary | ICD-10-CM | POA: Diagnosis not present

## 2019-07-01 DIAGNOSIS — I5022 Chronic systolic (congestive) heart failure: Secondary | ICD-10-CM | POA: Diagnosis not present

## 2019-07-01 DIAGNOSIS — I428 Other cardiomyopathies: Secondary | ICD-10-CM

## 2019-07-01 LAB — CUP PACEART INCLINIC DEVICE CHECK
Date Time Interrogation Session: 20200904102817
Implantable Lead Implant Date: 20120511
Implantable Lead Location: 753860
Implantable Lead Model: 7120
Implantable Pulse Generator Implant Date: 20120511
Pulse Gen Serial Number: 634653

## 2019-07-01 NOTE — Addendum Note (Signed)
Addended by: Rosalene Billings on: 07/01/2019 01:49 PM   Modules accepted: Level of Service

## 2019-07-01 NOTE — Patient Instructions (Signed)
Medication Instructions:  Continue all current medications.  Labwork: none  Testing/Procedures: none  Follow-Up: 1 year - Dr.  Allred   Any Other Special Instructions Will Be Listed Below (If Applicable).   If you need a refill on your cardiac medications before your next appointment, please call your pharmacy.  

## 2019-07-01 NOTE — Progress Notes (Signed)
PCP: Redmond School, MD Primary Cardiologist: Dr [name] Primary EP: Dr Rayann Heman  Anita Warren is a 58 y.o. female who presents today for routine electrophysiology followup.  Since last being seen in our clinic, the patient reports doing very well.  Today, she denies symptoms of palpitations, chest pain, shortness of breath,  lower extremity edema, dizziness, presyncope, syncope, or ICD shocks.  The patient is otherwise without complaint today.   Past Medical History:  Diagnosis Date  . AICD (automatic cardioverter/defibrillator) present    [employer] AICD implanted 03-07-2011 Dr. Lathaniel Legate/-Dr. [name] now follows  . CHF (congestive heart failure) ([place])    meds controlling, no episodes since 2014  . Chronic systolic heart failure ([place])   . Essential hypertension, benign   . Headache   . History of kidney stones    multiple kidney stones in past  . History of medication noncompliance   . Hydronephrosis with renal and ureteral calculus obstruction 09/05/2013  . Nonischemic cardiomyopathy ([facility])    LVEF 5-10%, likely viral (no CAD by cath 01/30/11)  . NSVT (nonsustained ventricular tachycardia) ([place])   . Obesity    Past Surgical History:  Procedure Laterality Date  . ABDOMINAL HYSTERECTOMY    . BREAST LUMPECTOMY  1989   L breast- benign  . CARDIAC DEFIBRILLATOR PLACEMENT  5/12   SJM by JA  . CESAREAN SECTION     x 2  . CHOLECYSTECTOMY    . COLONOSCOPY  07/02/2012   Procedure: COLONOSCOPY;  Surgeon: [name], MD;  Location: [facility];  Service: Endoscopy;  Laterality: N/A;  1:15/PATIENT HAS A DEFIBRILLATOR  . CYSTOSCOPY W/ URETERAL STENT PLACEMENT Right 09/06/2013   Procedure: CYSTOSCOPY WITH RIGHT RETROGRADE PYELOGRAM; RIGHT URETERAL STENT PLACEMENT;  Surgeon: [name], MD;  Location: [facility];  Service: Urology;  Laterality: Right;  . CYSTOSCOPY W/ URETERAL STENT PLACEMENT N/A 04/28/2016   Procedure: CYSTOSCOPY WITH  RIGHT RETROGRADE PYELOGRAM/RIGHT URETERAL STENT  PLACEMENT;  Surgeon: Rana Snare, MD;  Location: [facility];  Service: Urology;  Laterality: N/A;  . CYSTOSCOPY W/ URETERAL STENT REMOVAL Right 07/21/2016   Procedure: CYSTOSCOPY WITH STENT REMOVAL;  Surgeon: Franchot Gallo, MD;  Location: [facility];  Service: Urology;  Laterality: Right;  . CYSTOSCOPY/URETEROSCOPY/HOLMIUM LASER/STENT PLACEMENT Right 07/21/2016   Procedure: CYSTOSCOPY/URETEROSCOPY/HOLMIUM LASER/   right retrograde pylegram;  Surgeon: Franchot Gallo, MD;  Location: [facility];  Service: Urology;  Laterality: Right;  . TONSILLECTOMY    . TUBAL LIGATION      ROS- all systems are reviewed and negative except as per HPI above  Current Outpatient Medications  Medication Sig Dispense Refill  . acetaminophen (TYLENOL) 500 MG tablet Take 1,000 mg by mouth every 6 (six) hours as needed for moderate pain.    Marland Kitchen apixaban (ELIQUIS) 5 MG TABS tablet Take 1 tablet (5 mg total) by mouth 2 (two) times daily. 180 tablet 3  . carvedilol (COREG) 25 MG tablet Take 1 tablet by mouth twice daily 180 tablet 0  . Coenzyme Q10 (CO Q 10) 100 MG CAPS Take 100 mg by mouth daily.     . furosemide (LASIX) 40 MG tablet Take 1 tablet (40 mg total) by mouth daily. 90 tablet 2  . hydrALAZINE (APRESOLINE) 100 MG tablet TAKE 1 TABLET BY MOUTH THREE TIMES DAILY 270 tablet 0  . isosorbide mononitrate (IMDUR) 30 MG 24 hr tablet Take 3 tablets by mouth once daily 270 tablet 0  . Magnesium Oxide 400 (240 MG) MG TABS Take 1  tablet by mouth daily. 90 tablet 3  . potassium chloride SA (K-DUR) 20 MEQ tablet Take 1 tablet (20 mEq total) by mouth daily. 30 tablet 3  . spironolactone (ALDACTONE) 25 MG tablet Take 1 tablet by mouth once daily 90 tablet 0   No current facility-administered medications for this visit.     Physical Exam: Vitals:   07/01/19 1017  BP: 116/68  Pulse: 71  Temp: 98.2 F (36.8 C)  SpO2: 98%  Weight: 192 lb (87.1 kg)  Height: 5\' 3"  (1.6 m)    GEN- The patient is well appearing, alert and  oriented x 3 today.   Head- normocephalic, atraumatic Eyes-  Sclera clear, conjunctiva pink Ears- hearing intact Oropharynx- clear Lungs- Clear to ausculation bilaterally, normal work of breathing Chest- ICD pocket is well healed Heart- Regular rate and rhythm, no murmurs, rubs or gallops, PMI not laterally displaced GI- soft, NT, ND, + BS Extremities- no clubbing, cyanosis, or edema  ICD interrogation- reviewed in detail today,  See PACEART report  ekg tracing 09/20/18- sinus, narrow QRS  Wt Readings from Last 3 Encounters:  07/01/19 192 lb (87.1 kg)  09/20/18 187 lb 12.8 oz (85.2 kg)  11/25/17 177 lb 12.8 oz (80.6 kg)    Assessment and Plan:  1.  Chronic systolic dysfunction/ non ischemic CM/  euvolemic today Stable on an appropriate medical regimen Normal ICD function See [facility] report No changes today she is not device dependant today followed in ICM device clinic  2. HTN Stable No change required today  3. Overweight Stable No change required today  4. Remote atypical atrial flutter Previously on amiodarone but stopped due to infrequency of events Continue eliquis as per Dr [name]  Return to see me in [place] in a year Overdue for Dr [name] (his note 11/19 reviewed)  Thompson Grayer MD, [facility] 07/01/2019 10:33 AM

## 2019-07-01 NOTE — Progress Notes (Signed)
EPIC Encounter for ICM Monitoring  Patient Name: Anita Warren is a 58 y.o. female Date: 07/01/2019 Primary Care Physican: Redmond School, MD Primary Cardiologist: [name]  Electrophysiologist: Allred 07/01/2019 Weight: 192 lbs (Office visit weight)   Transmission reviewed.  Patient had OV today with Dr Rayann Heman.  CorvueThoracic impedancenormal.  Prescribed:Furosemide 40 mg 1 tabletdaily. Potassium 20 mEq 1tablet daily.   Labs: 12/06/2018 Creatinine 1.36, BUN 17, Potassium 4.2, Sodium 138, GFR 43-50 09/20/2018 Creatinine 1.49, BUN 23, Potassium 4.0, Sodium 136, GFR 38-44 02/22/2018 Creatinine 1.32, BUN 20, Potassium 3.8, Sodium 139, GFR 44-51  Recommendations:None  Follow-up plan: ICM clinic phone appointment on 08/15/2019.    Copy of ICM check sent to Dr. Rayann Heman.   3 month ICM trend: 06/27/2019    1 Year ICM trend:       Rosalene Billings, RN 07/01/2019 12:09 PM

## 2019-07-05 ENCOUNTER — Encounter: Payer: Self-pay | Admitting: Cardiology

## 2019-07-05 NOTE — Progress Notes (Signed)
Remote ICD transmission.   

## 2019-07-27 DIAGNOSIS — N6320 Unspecified lump in the left breast, unspecified quadrant: Secondary | ICD-10-CM | POA: Diagnosis not present

## 2019-07-27 DIAGNOSIS — I429 Cardiomyopathy, unspecified: Secondary | ICD-10-CM | POA: Diagnosis not present

## 2019-07-27 DIAGNOSIS — I10 Essential (primary) hypertension: Secondary | ICD-10-CM | POA: Diagnosis not present

## 2019-07-27 DIAGNOSIS — I4891 Unspecified atrial fibrillation: Secondary | ICD-10-CM | POA: Diagnosis not present

## 2019-08-12 ENCOUNTER — Other Ambulatory Visit (HOSPITAL_COMMUNITY): Payer: Self-pay | Admitting: Cardiology

## 2019-08-15 ENCOUNTER — Other Ambulatory Visit (HOSPITAL_COMMUNITY): Payer: Self-pay | Admitting: Cardiology

## 2019-08-15 ENCOUNTER — Ambulatory Visit (INDEPENDENT_AMBULATORY_CARE_PROVIDER_SITE_OTHER): Payer: Medicare Other

## 2019-08-15 DIAGNOSIS — Z95810 Presence of automatic (implantable) cardiac defibrillator: Secondary | ICD-10-CM

## 2019-08-15 DIAGNOSIS — I5022 Chronic systolic (congestive) heart failure: Secondary | ICD-10-CM | POA: Diagnosis not present

## 2019-08-16 NOTE — Progress Notes (Signed)
EPIC Encounter for ICM Monitoring  Patient Name: Anita Warren is a 58 y.o. female Date: 08/16/2019 Primary Care Physican: Redmond School, MD Primary Cardiologist: [name]  Electrophysiologist: Allred 08/16/2019 Weight:184 lbs   Spoke with patient and she denies any fluid symptoms.  Pt cut back on fluid intake because she thought she was retaining fluid.  Encouraged to drink up to 64 oz daily to stay hydrated.    CorvueThoracic impedancenormal but suggestive of dryness from 10/8 - 10/16.  Prescribed:Furosemide 40 mg 1 tabletdaily. Potassium 20 mEq 1tablet daily.   Labs: 12/06/2018 Creatinine 1.36, BUN 17, Potassium 4.2, Sodium 138, GFR 43-50  Recommendations: Reinforced fluid intake to 64 oz daily to stay hydrated.  Encouraged to call if experiencing fluid symptoms.  Follow-up plan: ICM clinic phone appointment on 09/27/2019.   91 day device clinic remote transmission 09/26/2019.      Copy of ICM check sent to Dr. Rayann Heman.   3 month ICM trend: 08/15/2019    1 Year ICM trend:       Rosalene Billings, RN 08/16/2019 2:12 PM

## 2019-08-25 ENCOUNTER — Other Ambulatory Visit (HOSPITAL_COMMUNITY): Payer: Self-pay | Admitting: Internal Medicine

## 2019-08-25 DIAGNOSIS — Z1231 Encounter for screening mammogram for malignant neoplasm of breast: Secondary | ICD-10-CM

## 2019-08-27 DIAGNOSIS — I5022 Chronic systolic (congestive) heart failure: Secondary | ICD-10-CM | POA: Diagnosis not present

## 2019-08-27 DIAGNOSIS — N183 Chronic kidney disease, stage 3 unspecified: Secondary | ICD-10-CM | POA: Diagnosis not present

## 2019-08-27 DIAGNOSIS — I130 Hypertensive heart and chronic kidney disease with heart failure and stage 1 through stage 4 chronic kidney disease, or unspecified chronic kidney disease: Secondary | ICD-10-CM | POA: Diagnosis not present

## 2019-08-27 DIAGNOSIS — I428 Other cardiomyopathies: Secondary | ICD-10-CM | POA: Diagnosis not present

## 2019-09-07 ENCOUNTER — Encounter (HOSPITAL_COMMUNITY)
Admission: RE | Admit: 2019-09-07 | Discharge: 2019-09-07 | Disposition: A | Discharge status: Home / Self Care | Payer: Medicare Other | Source: Ambulatory Visit | Attending: Internal Medicine | Admitting: Internal Medicine

## 2019-09-07 ENCOUNTER — Other Ambulatory Visit: Payer: Self-pay

## 2019-09-07 DIAGNOSIS — Z1231 Encounter for screening mammogram for malignant neoplasm of breast: Secondary | ICD-10-CM | POA: Insufficient documentation

## 2019-09-09 ENCOUNTER — Other Ambulatory Visit (HOSPITAL_COMMUNITY): Payer: Self-pay | Admitting: Internal Medicine

## 2019-09-09 DIAGNOSIS — R928 Other abnormal and inconclusive findings on diagnostic imaging of breast: Secondary | ICD-10-CM

## 2019-09-13 ENCOUNTER — Ambulatory Visit (HOSPITAL_COMMUNITY)
Admission: RE | Admit: 2019-09-13 | Discharge: 2019-09-13 | Disposition: A | Discharge status: Home / Self Care | Payer: Medicare Other | Source: Ambulatory Visit | Attending: Internal Medicine | Admitting: Internal Medicine

## 2019-09-13 ENCOUNTER — Other Ambulatory Visit: Payer: Self-pay

## 2019-09-13 ENCOUNTER — Other Ambulatory Visit (HOSPITAL_COMMUNITY): Payer: Self-pay | Admitting: Internal Medicine

## 2019-09-13 DIAGNOSIS — R928 Other abnormal and inconclusive findings on diagnostic imaging of breast: Secondary | ICD-10-CM

## 2019-09-13 DIAGNOSIS — N6321 Unspecified lump in the left breast, upper outer quadrant: Secondary | ICD-10-CM | POA: Diagnosis not present

## 2019-09-20 ENCOUNTER — Ambulatory Visit (HOSPITAL_COMMUNITY)
Admission: RE | Admit: 2019-09-20 | Discharge: 2019-09-20 | Disposition: A | Discharge status: Home / Self Care | Payer: Medicare Other | Source: Ambulatory Visit | Attending: Internal Medicine | Admitting: Internal Medicine

## 2019-09-20 ENCOUNTER — Other Ambulatory Visit: Payer: Self-pay

## 2019-09-20 ENCOUNTER — Other Ambulatory Visit (HOSPITAL_COMMUNITY): Payer: Self-pay | Admitting: Internal Medicine

## 2019-09-20 DIAGNOSIS — R928 Other abnormal and inconclusive findings on diagnostic imaging of breast: Secondary | ICD-10-CM

## 2019-09-20 DIAGNOSIS — N6321 Unspecified lump in the left breast, upper outer quadrant: Secondary | ICD-10-CM | POA: Diagnosis not present

## 2019-09-20 MED ORDER — LIDOCAINE HCL (PF) 2 % IJ SOLN
INTRAMUSCULAR | Status: AC
  Administered 2019-09-20: 09:00:00
  Filled 2019-09-20: qty 10

## 2019-09-20 MED ORDER — LIDOCAINE-EPINEPHRINE (PF) 1 %-1:200000 IJ SOLN
INTRAMUSCULAR | Status: AC
  Administered 2019-09-20: 09:00:00
  Filled 2019-09-20: qty 30

## 2019-09-20 MED ORDER — SODIUM BICARBONATE 4.2 % IV SOLN
INTRAVENOUS | Status: AC
  Administered 2019-09-20: 09:00:00
  Filled 2019-09-20: qty 10

## 2019-09-21 LAB — SURGICAL PATHOLOGY

## 2019-09-26 ENCOUNTER — Ambulatory Visit (INDEPENDENT_AMBULATORY_CARE_PROVIDER_SITE_OTHER): Payer: Medicare Other | Admitting: *Deleted

## 2019-09-26 DIAGNOSIS — I5022 Chronic systolic (congestive) heart failure: Secondary | ICD-10-CM

## 2019-09-27 ENCOUNTER — Ambulatory Visit (INDEPENDENT_AMBULATORY_CARE_PROVIDER_SITE_OTHER): Payer: Medicare Other

## 2019-09-27 DIAGNOSIS — I5022 Chronic systolic (congestive) heart failure: Secondary | ICD-10-CM

## 2019-09-27 DIAGNOSIS — Z95810 Presence of automatic (implantable) cardiac defibrillator: Secondary | ICD-10-CM

## 2019-09-27 LAB — CUP PACEART REMOTE DEVICE CHECK
Battery Remaining Longevity: 37 mo
Battery Remaining Percentage: 32 %
Battery Voltage: 2.86 V
Brady Statistic RV Percent Paced: 1 %
Date Time Interrogation Session: 20201201095732
HighPow Impedance: 40 Ohm
Implantable Lead Implant Date: 20120511
Implantable Lead Location: 753860
Implantable Lead Model: 7120
Implantable Pulse Generator Implant Date: 20120511
Lead Channel Impedance Value: 480 Ohm
Lead Channel Pacing Threshold Amplitude: 1 V
Lead Channel Pacing Threshold Pulse Width: 0.5 ms
Lead Channel Sensing Intrinsic Amplitude: 11.7 mV
Lead Channel Setting Pacing Amplitude: 2.5 V
Lead Channel Setting Pacing Pulse Width: 0.5 ms
Lead Channel Setting Sensing Sensitivity: 0.5 mV
Pulse Gen Serial Number: 634653

## 2019-09-28 ENCOUNTER — Other Ambulatory Visit (HOSPITAL_COMMUNITY): Payer: Self-pay | Admitting: Cardiology

## 2019-09-30 NOTE — Progress Notes (Signed)
EPIC Encounter for ICM Monitoring  Patient Name: Anita Warren is a 58 y.o. female Date: 09/30/2019 Primary Care Physican: Redmond School, MD Primary Cardiologist: [name]  Electrophysiologist: Allred 12/4/2020Weight:184 lbs   Spoke with patient. She will be scheduling to have breast lump removed.  The lump is not cancerous but has a possibility to develop into cancer.  She denies fluid symptoms.   CorvueThoracic impedancesuggesting fluid accumulation but returned to normal on transmission date, 09/27/2019.  Prescribed:Furosemide 40 mg 1 tabletdaily. Potassium 20 mEq 1tablet daily.   Labs: 12/06/2018 Creatinine 1.36, BUN 17, Potassium 4.2, Sodium 138, GFR 43-50  Recommendations:   Encouraged to call if experiencing fluid symptoms.  Follow-up plan: ICM clinic phone appointment on 10/31/2019.   91 day device clinic remote transmission 12/26/2019.    Copy of ICM check sent to Dr. Rayann Heman.   3 month ICM trend: 09/27/2019    1 Year ICM trend:       Rosalene Billings, RN 09/30/2019 9:55 AM

## 2019-10-04 DIAGNOSIS — Z13220 Encounter for screening for lipoid disorders: Secondary | ICD-10-CM | POA: Diagnosis not present

## 2019-10-04 DIAGNOSIS — Z79899 Other long term (current) drug therapy: Secondary | ICD-10-CM | POA: Diagnosis not present

## 2019-10-04 DIAGNOSIS — I4892 Unspecified atrial flutter: Secondary | ICD-10-CM | POA: Diagnosis not present

## 2019-10-04 DIAGNOSIS — N183 Chronic kidney disease, stage 3 unspecified: Secondary | ICD-10-CM | POA: Diagnosis not present

## 2019-10-04 DIAGNOSIS — N63 Unspecified lump in unspecified breast: Secondary | ICD-10-CM | POA: Diagnosis not present

## 2019-10-04 DIAGNOSIS — I10 Essential (primary) hypertension: Secondary | ICD-10-CM | POA: Diagnosis not present

## 2019-10-06 ENCOUNTER — Ambulatory Visit: Payer: Medicare Other | Admitting: General Surgery

## 2019-10-06 ENCOUNTER — Encounter: Payer: Self-pay | Admitting: General Surgery

## 2019-10-06 ENCOUNTER — Other Ambulatory Visit: Payer: Self-pay

## 2019-10-06 VITALS — BP 126/72 | HR 79 | Temp 98.9°F | Resp 16 | Ht 63.0 in | Wt 193.0 lb

## 2019-10-06 DIAGNOSIS — D242 Benign neoplasm of left breast: Secondary | ICD-10-CM

## 2019-10-06 NOTE — Patient Instructions (Signed)
Breast Biopsy  A breast biopsy is a procedure in which a sample of breast tissue is removed from the breast and examined under a microscope to see if cancerous cells are present. You may need a breast biopsy if you have:   Any undiagnosed breast mass (tumor).   Nipple abnormalities, dimpling, crusting, or ulcerations.   Abnormal discharge from the nipple, especially blood.   Redness, swelling, and pain of the breast.   Calcium deposits (calcifications) or abnormalities seen on a mammogram, ultrasound results, or MRI results.   Abnormal changes in the breast seen on your mammogram.  If the breast abnormality is found to be cancerous (malignant), a breast biopsy can help to determine what the best treatment is for you. There are many different types of breast biopsies. Talk with your health care provider about your options and which type is best for you.  Tell a health care provider about:   Any allergies you have.   All medicines you are taking, including vitamins, herbs, eye drops, creams, and over-the-counter medicines.   Any problems you or family members have had with anesthetic medicines.   Any blood disorders you have.   Any surgeries you have had.   Any medical conditions you have.   Whether you are pregnant or may be pregnant.  What are the risks?  Generally, this is a safe procedure. However, problems may occur, including:   Discomfort. This is temporary.   Bruising and swelling of the breast.   Changes in the shape of the breast.   Bleeding.   Infection.   Damage to other tissues.   Allergic reactions to medicines.   Needing more surgery.  What happens before the procedure?  Medicines  Ask your health care provider about:   Changing or stopping your regular medicines. This is especially important if you are taking diabetes medicines or blood thinners.   Taking medicines such as aspirin and ibuprofen. These medicines can thin your blood. Do not take these medicines unless  your health care provider tells you to take them.   Taking over-the-counter medicines, vitamins, herbs, and supplements.  Lifestyle   Do not use any products that contain nicotine or tobacco, such as cigarettes, e-cigarettes, and chewing tobacco. If you need help quitting, ask your health care provider.   Do not drink alcohol for 24 hours before the procedure.   Wear a good support bra to the procedure.  Eating and drinking restrictions  Talk to your health care provider about when you should stop eating and drinking.   You may be asked not to drink or eat for 2-8 hours before the breast biopsy.   In some cases, you may be allowed to eat a light breakfast.  General instructions   Plan to have someone take you home from the hospital or clinic.   Ask your health care provider how your surgical site will be marked or identified.   Ask your health care provider what steps will be taken to help prevent infection. These may include:  ? Removing hair at the surgery site.  ? Washing skin with a germ-killing soap.   Your health care provider may do a procedure to locate and Anita Warren the tumor area in your breast (localization). This will help guide your surgeon to where the biopsy or incision is made. This may be done with:  ? Imaging, such as a mammogram, ultrasound, or MRI.  ? Insertion of special wire, clip, seed, or radar reflector implant in the   health care provider will perform the biopsy using only one of the following methods. He or she will do: ? Fine needle aspiration. A thin needle with a syringe will be inserted into a breast cyst. Fluid and cells will be removed. ? Core needle biopsy. A wide, hollow needle (core needle) will be inserted into a breast  lump multiple times to remove tissue samples or cores. ? Stereotactic biopsy. X-rays and a computer will be used to locate the breast lump. The surgeon will use the X-ray images to collect several samples of tissue using a needle. ? Vacuum-assisted biopsy. A small incision will be made in your breast. A hollow needle and vacuum will be passed through the incision and into the breast tissue. The vacuum will gently draw abnormal breast tissue into the needle to remove it. ? Ultrasound-guided core needle biopsy. An ultrasound will be used to help guide the core needle to the area of the mass or abnormality. An incision will be made to insert the needle. Then tissue samples will be removed. ? Surgical biopsy. An incision will be made in the breast to remove part or all of the abnormal tissue. After the tissue is removed, the skin over the area will be closed with sutures and covered with a dressing. There are two types of surgical biopsies:  Incisional biopsy. The surgeon will remove part of the breast lump.  Excisional biopsy. The surgeon will attempt to remove the whole breast lump or as much of it as possible. After any of these procedures, the tissue or fluid that was removed will be examined under a microscope. The procedure may vary among health care providers and hospitals. What happens after the procedure?  You will be taken to the recovery area. If you are doing well and have no problems, you will be allowed to go home.  You may have a pressure dressing applied on your breast for 24-48 hours. You may also be advised to wear a supportive bra during this time.  Do not drive for 24 hours if you were given a sedative during your procedure. Summary  A breast biopsy is a procedure in which a sample of breast tissue is removed from the breast and examined under a microscope to see if cancerous cells are present.  This is a safe procedure, but problems can occur, including bleeding, infection,  pain, and bruising.  Ask your health care provider about changing or stopping your regular medicines.  Plan to have someone take you home from the hospital or clinic. This information is not intended to replace advice given to you by your health care provider. Make sure you discuss any questions you have with your health care provider. Document Released: 10/13/2005 Document Revised: 03/31/2018 Document Reviewed: 03/31/2018 Elsevier Patient Education  2020 [employer].

## 2019-10-06 NOTE — Progress Notes (Signed)
Anita Warren; [id number]; [birth year]   HPI Patient is a 58 year old black female who was referred to my care by Dr. Gerarda Fraction and the [facility] for surgical excision of the left ductal papilloma.  This was found on screening mammography.  Core biopsy reveals a papilloma.  The original size on ultrasound is 1.4 cm.  Patient has had a breast biopsy in the remote past which was unremarkable.  There is no family history of breast cancer.  Patient denies any nipple discharge.  She has 0 out of 10 breast pain.  Patient does have a significant cardiac history with a cardiomyopathy and placement of an AICD.  She is on Eliquis. Past Medical History:  Diagnosis Date  . AICD (automatic cardioverter/defibrillator) present    St. Jude AICD implanted 03-07-2011 Dr. Allred/-Dr. [name] now follows  . CHF (congestive heart failure) ([place])    meds controlling, no episodes since 2014  . Chronic systolic heart failure ([place])   . Essential hypertension, benign   . Headache   . History of kidney stones    multiple kidney stones in past  . History of medication noncompliance   . Hydronephrosis with renal and ureteral calculus obstruction 09/05/2013  . Nonischemic cardiomyopathy ([facility])    LVEF 5-10%, likely viral (no CAD by cath 01/30/11)  . NSVT (nonsustained ventricular tachycardia) ([place])   . Obesity     Past Surgical History:  Procedure Laterality Date  . ABDOMINAL HYSTERECTOMY    . BREAST LUMPECTOMY  1989   L breast- benign  . CARDIAC DEFIBRILLATOR PLACEMENT  5/12   SJM by JA  . CESAREAN SECTION     x 2  . CHOLECYSTECTOMY    . COLONOSCOPY  07/02/2012   Procedure: COLONOSCOPY;  Surgeon: [name], MD;  [facility];  Service: Endoscopy;  Laterality: N/A;  1:15/PATIENT HAS A DEFIBRILLATOR  . CYSTOSCOPY W/ URETERAL STENT PLACEMENT Right 09/06/2013   Procedure: CYSTOSCOPY WITH RIGHT RETROGRADE PYELOGRAM; RIGHT URETERAL STENT PLACEMENT;  Surgeon: [name], MD;  Location: [facility];   Service: Urology;  Laterality: Right;  . CYSTOSCOPY W/ URETERAL STENT PLACEMENT N/A 04/28/2016   Procedure: CYSTOSCOPY WITH  RIGHT RETROGRADE PYELOGRAM/RIGHT URETERAL STENT PLACEMENT;  Surgeon: Rana Snare, MD;  Location: [facility];  Service: Urology;  Laterality: N/A;  . CYSTOSCOPY W/ URETERAL STENT REMOVAL Right 07/21/2016   Procedure: CYSTOSCOPY WITH STENT REMOVAL;  Surgeon: Franchot Gallo, MD;  Location: [facility];  Service: Urology;  Laterality: Right;  . CYSTOSCOPY/URETEROSCOPY/HOLMIUM LASER/STENT PLACEMENT Right 07/21/2016   Procedure: CYSTOSCOPY/URETEROSCOPY/HOLMIUM LASER/   right retrograde pylegram;  Surgeon: Franchot Gallo, MD;  Location: [facility];  Service: Urology;  Laterality: Right;  . TONSILLECTOMY    . TUBAL LIGATION      Family History  Problem Relation Age of Onset  . Hypertension Other   . Diabetes Other   . Colon cancer Neg Hx     Current Outpatient Medications on File Prior to Visit  Medication Sig Dispense Refill  . acetaminophen (TYLENOL) 500 MG tablet Take 1,000 mg by mouth every 6 (six) hours as needed for moderate pain.    Marland Kitchen apixaban (ELIQUIS) 5 MG TABS tablet Take 1 tablet (5 mg total) by mouth 2 (two) times daily. 180 tablet 3  . carvedilol (COREG) 25 MG tablet Take 1 tablet by mouth twice daily 180 tablet 0  . Coenzyme Q10 (CO Q 10) 100 MG CAPS Take 100 mg by mouth daily.     . furosemide (LASIX) 40 MG  tablet Take 1 tablet (40 mg total) by mouth daily. 90 tablet 2  . hydrALAZINE (APRESOLINE) 100 MG tablet TAKE 1 TABLET BY MOUTH THREE TIMES DAILY 270 tablet 0  . isosorbide mononitrate (IMDUR) 30 MG 24 hr tablet Take 3 tablets by mouth once daily 270 tablet 0  . Magnesium Oxide 400 (240 MG) MG TABS Take 1 tablet by mouth daily. 90 tablet 3  . potassium chloride SA (K-DUR) 20 MEQ tablet Take 1 tablet (20 mEq total) by mouth daily. 30 tablet 3  . spironolactone (ALDACTONE) 25 MG tablet Take 1 tablet by mouth once daily 90 tablet 0   No current facility-administered  medications on file prior to visit.    Allergies  Allergen Reactions  . Peanut-Containing Drug Products Anaphylaxis  . Ace Inhibitors Swelling  . Morphine Nausea And Vomiting  . Demerol Rash    Social History   Substance and Sexual Activity  Alcohol Use No    Social History   Tobacco Use  Smoking Status Never Smoker  Smokeless Tobacco Never Used    Review of Systems  Constitutional: Negative.   HENT: Negative.   Eyes: Negative.   Respiratory: Negative.   Cardiovascular: Negative.   Gastrointestinal: Negative.   Genitourinary: Negative.   Musculoskeletal: Negative.   Skin: Negative.   Neurological: Negative.   Endo/Heme/Allergies: Bruises/bleeds easily.  Psychiatric/Behavioral: Negative.     Objective   Vitals:   10/06/19 0857  BP: 126/72  Pulse: 79  Resp: 16  Temp: 98.9 F (37.2 C)  SpO2: 98%    Physical Exam Vitals reviewed.  Constitutional:      Appearance: Normal appearance. She is not ill-appearing.  HENT:     Head: Normocephalic and atraumatic.  Cardiovascular:     Rate and Rhythm: Normal rate and regular rhythm.     Heart sounds: Normal heart sounds. No murmur. No friction rub. No gallop.   Pulmonary:     Effort: Pulmonary effort is normal. No respiratory distress.     Breath sounds: Normal breath sounds. No stridor. No wheezing, rhonchi or rales.  Musculoskeletal:     Cervical back: Normal range of motion and neck supple.  Lymphadenopathy:     Cervical: No cervical adenopathy.  Skin:    General: Skin is warm and dry.  Neurological:     Mental Status: She is alert and oriented to person, place, and time.    Breast: No dominant mass, nipple discharge, or dimpling in either breast.  Both axilla is negative for palpable nodes.  She does have a lipoma in the left axilla inferiorly.  Mammography and pathology results reviewed. Assessment  Left breast papilloma Plan   Given the size of the distortion on ultrasound, a left breast biopsy  after needle localization is recommended.  The risks and benefits of the procedure were explained to the patient.  Will discuss with anesthesia and her cardiologist prior to surgical intervention.

## 2019-10-10 ENCOUNTER — Other Ambulatory Visit (HOSPITAL_COMMUNITY): Payer: Self-pay | Admitting: General Surgery

## 2019-10-10 DIAGNOSIS — D369 Benign neoplasm, unspecified site: Secondary | ICD-10-CM

## 2019-10-13 NOTE — H&P (Signed)
Anita Warren; [id number]; April 29, 1961   HPI Anita Warren is a 58 year old black female who was referred to my care by Dr. Gerarda Fraction and the [facility] for surgical excision of the left ductal papilloma.  This was found on screening mammography.  Core biopsy reveals a papilloma.  The original size on ultrasound is 1.4 cm.  Anita Warren has had a breast biopsy in the remote past which was unremarkable.  There is no family history of breast cancer.  Anita Warren denies any nipple discharge.  She has 0 out of 10 breast pain.  Anita Warren does have a significant cardiac history with a cardiomyopathy and placement of an AICD.  She is on Eliquis. Past Medical History:  Diagnosis Date  . AICD (automatic cardioverter/defibrillator) present    St. Jude AICD implanted 03-07-2011 Dr. Allred/-Dr. [name] now follows  . CHF (congestive heart failure) ([place])    meds controlling, no episodes since 2014  . Chronic systolic heart failure ([place])   . Essential hypertension, benign   . Headache   . History of kidney stones    multiple kidney stones in past  . History of medication noncompliance   . Hydronephrosis with renal and ureteral calculus obstruction 09/05/2013  . Nonischemic cardiomyopathy ([facility])    LVEF 5-10%, likely viral (no CAD by cath 01/30/11)  . NSVT (nonsustained ventricular tachycardia) ([place])   . Obesity     Past Surgical History:  Procedure Laterality Date  . ABDOMINAL HYSTERECTOMY    . BREAST LUMPECTOMY  1989   L breast- benign  . CARDIAC DEFIBRILLATOR PLACEMENT  5/12   SJM by JA  . CESAREAN SECTION     x 2  . CHOLECYSTECTOMY    . COLONOSCOPY  07/02/2012   Procedure: COLONOSCOPY;  Surgeon: [name], MD;  [facility];  Service: Endoscopy;  Laterality: N/A;  1:15/Anita Warren HAS A DEFIBRILLATOR  . CYSTOSCOPY W/ URETERAL STENT PLACEMENT Right 09/06/2013   Procedure: CYSTOSCOPY WITH RIGHT RETROGRADE PYELOGRAM; RIGHT URETERAL STENT PLACEMENT;  Surgeon: [name], MD;  Location: [facility];   Service: Urology;  Laterality: Right;  . CYSTOSCOPY W/ URETERAL STENT PLACEMENT N/A 04/28/2016   Procedure: CYSTOSCOPY WITH  RIGHT RETROGRADE PYELOGRAM/RIGHT URETERAL STENT PLACEMENT;  Surgeon: Rana Snare, MD;  [facility];  Service: Urology;  Laterality: N/A;  . CYSTOSCOPY W/ URETERAL STENT REMOVAL Right 07/21/2016   Procedure: CYSTOSCOPY WITH STENT REMOVAL;  Surgeon: Franchot Gallo, MD;  [facility];  Service: Urology;  Laterality: Right;  . CYSTOSCOPY/URETEROSCOPY/HOLMIUM LASER/STENT PLACEMENT Right 07/21/2016   Procedure: CYSTOSCOPY/URETEROSCOPY/HOLMIUM LASER/   right retrograde pylegram;  Surgeon: Franchot Gallo, MD;  [facility];  Service: Urology;  Laterality: Right;  . TONSILLECTOMY    . TUBAL LIGATION      Family History  Problem Relation Age of Onset  . Hypertension Other   . Diabetes Other   . Colon cancer Neg Hx     Current Outpatient Medications on File Prior to Visit  Medication Sig Dispense Refill  . acetaminophen (TYLENOL) 500 MG tablet Take 1,000 mg by mouth every 6 (six) hours as needed for moderate pain.    Marland Kitchen apixaban (ELIQUIS) 5 MG TABS tablet Take 1 tablet (5 mg total) by mouth 2 (two) times daily. 180 tablet 3  . carvedilol (COREG) 25 MG tablet Take 1 tablet by mouth twice daily 180 tablet 0  . Coenzyme Q10 (CO Q 10) 100 MG CAPS Take 100 mg by mouth daily.     . furosemide (LASIX) 40 MG  tablet Take 1 tablet (40 mg total) by mouth daily. 90 tablet 2  . hydrALAZINE (APRESOLINE) 100 MG tablet TAKE 1 TABLET BY MOUTH THREE TIMES DAILY 270 tablet 0  . isosorbide mononitrate (IMDUR) 30 MG 24 hr tablet Take 3 tablets by mouth once daily 270 tablet 0  . Magnesium Oxide 400 (240 MG) MG TABS Take 1 tablet by mouth daily. 90 tablet 3  . potassium chloride SA (K-DUR) 20 MEQ tablet Take 1 tablet (20 mEq total) by mouth daily. 30 tablet 3  . spironolactone (ALDACTONE) 25 MG tablet Take 1 tablet by mouth once daily 90 tablet 0   No current facility-administered  medications on file prior to visit.    Allergies  Allergen Reactions  . Peanut-Containing Drug Products Anaphylaxis  . Ace Inhibitors Swelling  . Morphine Nausea And Vomiting  . Demerol Rash    Social History   Substance and Sexual Activity  Alcohol Use No    Social History   Tobacco Use  Smoking Status Never Smoker  Smokeless Tobacco Never Used    Review of Systems  Constitutional: Negative.   HENT: Negative.   Eyes: Negative.   Respiratory: Negative.   Cardiovascular: Negative.   Gastrointestinal: Negative.   Genitourinary: Negative.   Musculoskeletal: Negative.   Skin: Negative.   Neurological: Negative.   Endo/Heme/Allergies: Bruises/bleeds easily.  Psychiatric/Behavioral: Negative.     Objective   Vitals:   10/06/19 0857  BP: 126/72  Pulse: 79  Resp: 16  Temp: 98.9 F (37.2 C)  SpO2: 98%    Physical Exam Vitals reviewed.  Constitutional:      Appearance: Normal appearance. She is not ill-appearing.  HENT:     Head: Normocephalic and atraumatic.  Cardiovascular:     Rate and Rhythm: Normal rate and regular rhythm.     Heart sounds: Normal heart sounds. No murmur. No friction rub. No gallop.   Pulmonary:     Effort: Pulmonary effort is normal. No respiratory distress.     Breath sounds: Normal breath sounds. No stridor. No wheezing, rhonchi or rales.  Musculoskeletal:     Cervical back: Normal range of motion and neck supple.  Lymphadenopathy:     Cervical: No cervical adenopathy.  Skin:    General: Skin is warm and dry.  Neurological:     Mental Status: She is alert and oriented to person, place, and time.    Breast: No dominant mass, nipple discharge, or dimpling in either breast.  Both axilla is negative for palpable nodes.  She does have a lipoma in the left axilla inferiorly.  Mammography and pathology results reviewed. Assessment  Left breast papilloma Plan   Given the size of the distortion on ultrasound, a left breast biopsy  after needle localization is recommended.  The risks and benefits of the procedure were explained to the Anita Warren.  Will discuss with anesthesia and her cardiologist prior to surgical intervention.  Will stop her Eliquis two days before procedure.   Addendum:  Cleared by Cardiology to proceed.

## 2019-10-14 NOTE — Pre-Procedure Instructions (Signed)
In basket message sent to Dr Loralie Champagne concerning prescription for cardiac device programming.

## 2019-10-14 NOTE — Patient Instructions (Signed)
Anita Warren  10/14/2019     @PREFPERIOPPHARMACY @   Your procedure is scheduled on   12/23/220   Report to [facility]  [place].M.  Call this number if you have problems the morning of surgery:  [phone]   Remember:  Do not eat or drink after midnight.                        Take these medicines the morning of surgery with A SIP OF WATER  Carvedilol, hydralazine, Isosorbide.    Do not wear jewelry, make-up or nail polish.  Do not wear lotions, powders, or perfumes, or deodorant. Please brush your teeth.  Do not shave 48 hours prior to surgery.  Men may shave face and neck.  Do not bring valuables to the hospital.  [facility] is not responsible for any belongings or valuables.  Contacts, dentures or bridgework may not be worn into surgery.  Leave your suitcase in the car.  After surgery it may be brought to your room.  For patients admitted to the hospital, discharge time will be determined by your treatment team.  Patients discharged the day of surgery will not be allowed to drive home.   Name and phone number of your driver:   family Special instructions:  None  Please read over the following fact sheets that you were given. Anesthesia Post-op Instructions and Care and Recovery After Surgery       Breast Biopsy, Care After These instructions give you information about caring for yourself after your procedure. Your doctor may also give you more specific instructions. Call your doctor if you have any problems or questions after your procedure. What can I expect after the procedure? After your procedure, it is common to have:  Bruising on your breast.  Numbness, tingling, or pain near your biopsy site. Follow these instructions at home: Medicines  Take over-the-counter and prescription medicines only as told by your doctor.  Do not drive for 24 hours if you were given a medicine to help you relax (sedative) during your procedure.  Do  not drink alcohol while taking pain medicine.  Do not drive or use heavy machinery while taking prescription pain medicine. Biopsy site care      Follow instructions from your doctor about how to take care of your cut from surgery (incision) or your puncture area. Make sure you: ? Wash your hands with soap and water before you change your bandage (dressing). If you cannot use soap and water, use hand sanitizer. ? Change your bandage as told by your doctor. ? Leave stitches (sutures), skin glue, or skin tape (adhesive strips) in place. They may need to stay in place for 2 weeks or longer. If tape strips get loose and curl up, you may trim the loose edges. Do not remove tape strips completely unless your doctor says it is okay.  If you have stitches, keep them dry when you take a bath or a shower.  Check your cut or puncture area every day for signs of infection. Check for: ? Redness, swelling, or pain. ? Fluid or blood. ? Warmth. ? Pus or a bad smell.  Protect the biopsy area. Do not let the area get bumped. Activity  If you had a cut during your procedure, avoid activities that could pull your cut open. These include: ? Stretching. ? Reaching over your head. ? Exercise. ? Sports. ?  Lifting anything that weighs more than 3 lb (1.4 kg).  Return to your normal activities as told by your doctor. Ask your doctor what activities are safe for you. Managing pain, stiffness, and swelling If told, put ice on the biopsy site to relieve swelling:  Put ice in a plastic bag.  Place a towel between your skin and the bag.  Leave the ice on for 20 minutes, 2-3 times a day. General instructions  Continue your normal diet.  Wear a good support bra for as long as told by your doctor.  Get checked for extra fluid around your lymph nodes (lymphedema) as often as told by your doctor.  Keep all follow-up visits as told by your doctor. This is important. Contact a doctor if:  You notice any  of the following at the biopsy site: ? More redness, swelling, or pain. ? More fluid or blood coming from the site. ? The site feels warm to the touch. ? Pus or a bad smell coming from the site. ? The site breaks open after the stitches or skin tape strips have been removed.  You have a rash.  You have a fever. Get help right away if:  You have more bleeding from the biopsy site. Get help right away if bleeding is more than a small spot.  You have trouble breathing.  You have red streaks around the biopsy site. Summary  After your procedure, it is common to have bruising, numbness, tingling, or pain near the biopsy site.  Do not drive or use heavy machinery while taking prescription pain medicine.  Wear a good support bra for as long as told by your doctor.  If you had a cut during your procedure, avoid activities that may pull the cut open. Ask your doctor what activities are safe for you. This information is not intended to replace advice given to you by your health care provider. Make sure you discuss any questions you have with your health care provider. Document Released: 08/09/2009 Document Revised: 04/01/2018 Document Reviewed: 04/01/2018 Elsevier Patient Education  2020 [employer] After These instructions provide you with information about caring for yourself after your procedure. Your health care provider may also give you more specific instructions. Your treatment has been planned according to current medical practices, but problems sometimes occur. Call your health care provider if you have any problems or questions after your procedure. What can I expect after the procedure? After your procedure, you may:  Feel sleepy for several hours.  Feel clumsy and have poor balance for several hours.  Feel forgetful about what happened after the procedure.  Have poor judgment for several hours.  Feel nauseous or vomit.  Have a sore  throat if you had a breathing tube during the procedure. Follow these instructions at home: For at least 24 hours after the procedure:      Have a responsible adult stay with you. It is important to have someone help care for you until you are awake and alert.  Rest as needed.  Do not: ? Participate in activities in which you could fall or become injured. ? Drive. ? Use heavy machinery. ? Drink alcohol. ? Take sleeping pills or medicines that cause drowsiness. ? Make important decisions or sign legal documents. ? Take care of children on your own. Eating and drinking  Follow the diet that is recommended by your health care provider.  If you vomit, drink water, juice, or soup when  you can drink without vomiting.  Make sure you have little or no nausea before eating solid foods. General instructions  Take over-the-counter and prescription medicines only as told by your health care provider.  If you have sleep apnea, surgery and certain medicines can increase your risk for breathing problems. Follow instructions from your health care provider about wearing your sleep device: ? Anytime you are sleeping, including during daytime naps. ? While taking prescription pain medicines, sleeping medicines, or medicines that make you drowsy.  If you smoke, do not smoke without supervision.  Keep all follow-up visits as told by your health care provider. This is important. Contact a health care provider if:  You keep feeling nauseous or you keep vomiting.  You feel light-headed.  You develop a rash.  You have a fever. Get help right away if:  You have trouble breathing. Summary  For several hours after your procedure, you may feel sleepy and have poor judgment.  Have a responsible adult stay with you for at least 24 hours or until you are awake and alert. This information is not intended to replace advice given to you by your health care provider. Make sure you discuss any  questions you have with your health care provider. Document Released: 02/03/2016 Document Revised: 01/11/2018 Document Reviewed: 02/03/2016 Elsevier Patient Education  2020 [employer]. How to Use Chlorhexidine for Bathing Chlorhexidine gluconate (CHG) is a germ-killing (antiseptic) solution that is used to clean the skin. It can get rid of the bacteria that normally live on the skin and can keep them away for about 24 hours. To clean your skin with CHG, you may be given:  A CHG solution to use in the shower or as part of a sponge bath.  A prepackaged cloth that contains CHG. Cleaning your skin with CHG may help lower the risk for infection:  While you are staying in the intensive care unit of the hospital.  If you have a vascular access, such as a central line, to provide short-term or long-term access to your veins.  If you have a catheter to drain urine from your bladder.  If you are on a ventilator. A ventilator is a machine that helps you breathe by moving air in and out of your lungs.  After surgery. What are the risks? Risks of using CHG include:  A skin reaction.  Hearing loss, if CHG gets in your ears.  Eye injury, if CHG gets in your eyes and is not rinsed out.  The CHG product catching fire. Make sure that you avoid smoking and flames after applying CHG to your skin. Do not use CHG:  If you have a chlorhexidine allergy or have previously reacted to chlorhexidine.  On babies younger than 7 months of age. How to use CHG solution  Use CHG only as told by your health care provider, and follow the instructions on the label.  Use the full amount of CHG as directed. Usually, this is one bottle. During a shower Follow these steps when using CHG solution during a shower (unless your health care provider gives you different instructions): 1. Start the shower. 2. Use your normal soap and shampoo to wash your face and hair. 3. Turn off the shower or move out of the  shower stream. 4. Pour the CHG onto a clean washcloth. Do not use any type of brush or rough-edged sponge. 5. Starting at your neck, lather your body down to your toes. Make sure you follow these instructions: ?  If you will be having surgery, pay special attention to the part of your body where you will be having surgery. Scrub this area for at least 1 minute. ? Do not use CHG on your head or face. If the solution gets into your ears or eyes, rinse them well with water. ? Avoid your genital area. ? Avoid any areas of skin that have broken skin, cuts, or scrapes. ? Scrub your back and under your arms. Make sure to wash skin folds. 6. Let the lather sit on your skin for 1-2 minutes or as long as told by your health care provider. 7. Thoroughly rinse your entire body in the shower. Make sure that all body creases and crevices are rinsed well. 8. Dry off with a clean towel. Do not put any substances on your body afterward--such as powder, lotion, or perfume--unless you are told to do so by your health care provider. Only use lotions that are recommended by the manufacturer. 9. Put on clean clothes or pajamas. 10. If it is the night before your surgery, sleep in clean sheets.  During a sponge bath Follow these steps when using CHG solution during a sponge bath (unless your health care provider gives you different instructions): 1. Use your normal soap and shampoo to wash your face and hair. 2. Pour the CHG onto a clean washcloth. 3. Starting at your neck, lather your body down to your toes. Make sure you follow these instructions: ? If you will be having surgery, pay special attention to the part of your body where you will be having surgery. Scrub this area for at least 1 minute. ? Do not use CHG on your head or face. If the solution gets into your ears or eyes, rinse them well with water. ? Avoid your genital area. ? Avoid any areas of skin that have broken skin, cuts, or scrapes. ? Scrub your  back and under your arms. Make sure to wash skin folds. 4. Let the lather sit on your skin for 1-2 minutes or as long as told by your health care provider. 5. Using a different clean, wet washcloth, thoroughly rinse your entire body. Make sure that all body creases and crevices are rinsed well. 6. Dry off with a clean towel. Do not put any substances on your body afterward--such as powder, lotion, or perfume--unless you are told to do so by your health care provider. Only use lotions that are recommended by the manufacturer. 7. Put on clean clothes or pajamas. 8. If it is the night before your surgery, sleep in clean sheets. How to use CHG prepackaged cloths  Only use CHG cloths as told by your health care provider, and follow the instructions on the label.  Use the CHG cloth on clean, dry skin.  Do not use the CHG cloth on your head or face unless your health care provider tells you to.  When washing with the CHG cloth: ? Avoid your genital area. ? Avoid any areas of skin that have broken skin, cuts, or scrapes. Before surgery Follow these steps when using a CHG cloth to clean before surgery (unless your health care provider gives you different instructions): 1. Using the CHG cloth, vigorously scrub the part of your body where you will be having surgery. Scrub using a back-and-forth motion for 3 minutes. The area on your body should be completely wet with CHG when you are done scrubbing. 2. Do not rinse. Discard the cloth and let the area air-dry. Do  not put any substances on the area afterward, such as powder, lotion, or perfume. 3. Put on clean clothes or pajamas. 4. If it is the night before your surgery, sleep in clean sheets.  For general bathing Follow these steps when using CHG cloths for general bathing (unless your health care provider gives you different instructions). 1. Use a separate CHG cloth for each area of your body. Make sure you wash between any folds of skin and between  your fingers and toes. Wash your body in the following order, switching to a new cloth after each step: ? The front of your neck, shoulders, and chest. ? Both of your arms, under your arms, and your hands. ? Your stomach and groin area, avoiding the genitals. ? Your right leg and foot. ? Your left leg and foot. ? The back of your neck, your back, and your buttocks. 2. Do not rinse. Discard the cloth and let the area air-dry. Do not put any substances on your body afterward--such as powder, lotion, or perfume--unless you are told to do so by your health care provider. Only use lotions that are recommended by the manufacturer. 3. Put on clean clothes or pajamas. Contact a health care provider if:  Your skin gets irritated after scrubbing.  You have questions about using your solution or cloth. Get help right away if:  Your eyes become very red or swollen.  Your eyes itch badly.  Your skin itches badly and is red or swollen.  Your hearing changes.  You have trouble seeing.  You have swelling or tingling in your mouth or throat.  You have trouble breathing.  You swallow any chlorhexidine. Summary  Chlorhexidine gluconate (CHG) is a germ-killing (antiseptic) solution that is used to clean the skin. Cleaning your skin with CHG may help to lower your risk for infection.  You may be given CHG to use for bathing. It may be in a bottle or in a prepackaged cloth to use on your skin. Carefully follow your health care provider's instructions and the instructions on the product label.  Do not use CHG if you have a chlorhexidine allergy.  Contact your health care provider if your skin gets irritated after scrubbing. This information is not intended to replace advice given to you by your health care provider. Make sure you discuss any questions you have with your health care provider. Document Released: 07/07/2012 Document Revised: 12/30/2018 Document Reviewed: 09/10/2017 Elsevier Patient  Education  2020 [employer].

## 2019-10-17 ENCOUNTER — Encounter (HOSPITAL_COMMUNITY)
Admission: RE | Admit: 2019-10-17 | Discharge: 2019-10-17 | Disposition: A | Discharge status: Home / Self Care | Payer: Medicare Other | Source: Ambulatory Visit | Attending: General Surgery | Admitting: General Surgery

## 2019-10-17 ENCOUNTER — Other Ambulatory Visit: Payer: Self-pay

## 2019-10-17 ENCOUNTER — Encounter (HOSPITAL_COMMUNITY): Payer: Self-pay

## 2019-10-17 ENCOUNTER — Ambulatory Visit (HOSPITAL_COMMUNITY)
Admission: RE | Admit: 2019-10-17 | Discharge: 2019-10-17 | Disposition: A | Discharge status: Home / Self Care | Payer: Medicare Other | Source: Ambulatory Visit | Attending: General Surgery | Admitting: General Surgery

## 2019-10-17 ENCOUNTER — Other Ambulatory Visit (HOSPITAL_COMMUNITY)
Admission: RE | Admit: 2019-10-17 | Discharge: 2019-10-17 | Disposition: A | Discharge status: Home / Self Care | Payer: Medicare Other | Source: Ambulatory Visit | Attending: General Surgery | Admitting: General Surgery

## 2019-10-17 DIAGNOSIS — Z01818 Encounter for other preprocedural examination: Secondary | ICD-10-CM | POA: Diagnosis not present

## 2019-10-17 DIAGNOSIS — I509 Heart failure, unspecified: Secondary | ICD-10-CM | POA: Diagnosis not present

## 2019-10-17 LAB — COMPREHENSIVE METABOLIC PANEL
ALT: 11 U/L (ref 0–44)
AST: 14 U/L — ABNORMAL LOW (ref 15–41)
Albumin: 3.5 g/dL (ref 3.5–5.0)
Alkaline Phosphatase: 40 U/L (ref 38–126)
Anion gap: 8 (ref 5–15)
BUN: 25 mg/dL — ABNORMAL HIGH (ref 6–20)
CO2: 24 mmol/L (ref 22–32)
Calcium: 8.8 mg/dL — ABNORMAL LOW (ref 8.9–10.3)
Chloride: 106 mmol/L (ref 98–111)
Creatinine, Ser: 1.48 mg/dL — ABNORMAL HIGH (ref 0.44–1.00)
GFR calc Af Amer: 45 mL/min — ABNORMAL LOW (ref >60)
GFR calc non Af Amer: 39 mL/min — ABNORMAL LOW (ref >60)
Glucose, Bld: 101 mg/dL — ABNORMAL HIGH (ref 70–99)
Potassium: 3.7 mmol/L (ref 3.5–5.1)
Sodium: 138 mmol/L (ref 135–145)
Total Bilirubin: 0.6 mg/dL (ref 0.3–1.2)
Total Protein: 8.5 g/dL — ABNORMAL HIGH (ref 6.5–8.1)

## 2019-10-17 LAB — CBC WITH DIFFERENTIAL/PLATELET
Abs Immature Granulocytes: 0.02 10*3/uL (ref 0.00–0.07)
Basophils Absolute: 0 10*3/uL (ref 0.0–0.1)
Basophils Relative: 1 %
Eosinophils Absolute: 0.2 10*3/uL (ref 0.0–0.5)
Eosinophils Relative: 3 %
HCT: 31.3 % — ABNORMAL LOW (ref 36.0–46.0)
Hemoglobin: 10 g/dL — ABNORMAL LOW (ref 12.0–15.0)
Immature Granulocytes: 0 %
Lymphocytes Relative: 38 %
Lymphs Abs: 2.7 10*3/uL (ref 0.7–4.0)
MCH: 29.9 pg (ref 26.0–34.0)
MCHC: 31.9 g/dL (ref 30.0–36.0)
MCV: 93.4 fL (ref 80.0–100.0)
Monocytes Absolute: 0.7 10*3/uL (ref 0.1–1.0)
Monocytes Relative: 9 %
Neutro Abs: 3.4 10*3/uL (ref 1.7–7.7)
Neutrophils Relative %: 49 %
Platelets: 186 10*3/uL (ref 150–400)
RBC: 3.35 MIL/uL — ABNORMAL LOW (ref 3.87–5.11)
RDW: 15.9 % — ABNORMAL HIGH (ref 11.5–15.5)
WBC: 7 10*3/uL (ref 4.0–10.5)
nRBC: 0 % (ref 0.0–0.2)

## 2019-10-17 LAB — SARS CORONAVIRUS 2 (TAT 6-24 HRS): SARS Coronavirus 2: NEGATIVE

## 2019-10-17 NOTE — Progress Notes (Signed)
Sent!

## 2019-10-19 ENCOUNTER — Ambulatory Visit (HOSPITAL_COMMUNITY): Payer: Medicare Other | Admitting: Anesthesiology

## 2019-10-19 ENCOUNTER — Other Ambulatory Visit: Payer: Self-pay

## 2019-10-19 ENCOUNTER — Encounter (HOSPITAL_COMMUNITY)
Admission: RE | Disposition: A | Discharge status: Home / Self Care | Payer: Self-pay | Source: Home / Self Care | Attending: General Surgery

## 2019-10-19 ENCOUNTER — Ambulatory Visit (HOSPITAL_COMMUNITY): Payer: Medicare Other

## 2019-10-19 ENCOUNTER — Ambulatory Visit (HOSPITAL_COMMUNITY)
Admission: RE | Admit: 2019-10-19 | Discharge: 2019-10-19 | Disposition: A | Discharge status: Home / Self Care | Payer: Medicare Other | Source: Ambulatory Visit | Attending: General Surgery | Admitting: General Surgery

## 2019-10-19 ENCOUNTER — Ambulatory Visit (HOSPITAL_COMMUNITY)
Admission: RE | Admit: 2019-10-19 | Discharge: 2019-10-19 | Disposition: A | Discharge status: Home / Self Care | Payer: Medicare Other | Attending: General Surgery | Admitting: General Surgery

## 2019-10-19 DIAGNOSIS — Z87442 Personal history of urinary calculi: Secondary | ICD-10-CM | POA: Insufficient documentation

## 2019-10-19 DIAGNOSIS — Z6831 Body mass index (BMI) 31.0-31.9, adult: Secondary | ICD-10-CM | POA: Insufficient documentation

## 2019-10-19 DIAGNOSIS — I5022 Chronic systolic (congestive) heart failure: Secondary | ICD-10-CM | POA: Diagnosis not present

## 2019-10-19 DIAGNOSIS — I509 Heart failure, unspecified: Secondary | ICD-10-CM | POA: Diagnosis not present

## 2019-10-19 DIAGNOSIS — E669 Obesity, unspecified: Secondary | ICD-10-CM | POA: Insufficient documentation

## 2019-10-19 DIAGNOSIS — Z79899 Other long term (current) drug therapy: Secondary | ICD-10-CM | POA: Insufficient documentation

## 2019-10-19 DIAGNOSIS — D249 Benign neoplasm of unspecified breast: Secondary | ICD-10-CM | POA: Diagnosis not present

## 2019-10-19 DIAGNOSIS — I110 Hypertensive heart disease with heart failure: Secondary | ICD-10-CM | POA: Diagnosis not present

## 2019-10-19 DIAGNOSIS — R928 Other abnormal and inconclusive findings on diagnostic imaging of breast: Secondary | ICD-10-CM | POA: Diagnosis not present

## 2019-10-19 DIAGNOSIS — Z95810 Presence of automatic (implantable) cardiac defibrillator: Secondary | ICD-10-CM | POA: Insufficient documentation

## 2019-10-19 DIAGNOSIS — D242 Benign neoplasm of left breast: Secondary | ICD-10-CM

## 2019-10-19 DIAGNOSIS — D369 Benign neoplasm, unspecified site: Secondary | ICD-10-CM

## 2019-10-19 DIAGNOSIS — Z7901 Long term (current) use of anticoagulants: Secondary | ICD-10-CM | POA: Insufficient documentation

## 2019-10-19 HISTORY — PX: BREAST BIOPSY: SHX20

## 2019-10-19 SURGERY — BREAST BIOPSY WITH NEEDLE LOCALIZATION
Anesthesia: General | Site: Breast | Laterality: Left

## 2019-10-19 MED ORDER — LACTATED RINGERS IV SOLN: INTRAVENOUS | Status: DC

## 2019-10-19 MED ORDER — DEXAMETHASONE SODIUM PHOSPHATE 10 MG/ML IJ SOLN
INTRAMUSCULAR | Status: DC | PRN
  Administered 2019-10-19: 6 mg via INTRAVENOUS

## 2019-10-19 MED ORDER — MIDAZOLAM HCL 2 MG/2ML IJ SOLN: 0.5000 mg | Freq: Once | INTRAMUSCULAR | Status: DC | PRN

## 2019-10-19 MED ORDER — LIDOCAINE 2% (20 MG/ML) 5 ML SYRINGE
INTRAMUSCULAR | Status: AC
  Filled 2019-10-19: qty 5

## 2019-10-19 MED ORDER — PHENYLEPHRINE HCL (PRESSORS) 10 MG/ML IV SOLN
INTRAVENOUS | Status: DC | PRN
  Administered 2019-10-19 (×4): 100 ug via INTRAVENOUS

## 2019-10-19 MED ORDER — DEXAMETHASONE SODIUM PHOSPHATE 10 MG/ML IJ SOLN
INTRAMUSCULAR | Status: AC
  Filled 2019-10-19: qty 1

## 2019-10-19 MED ORDER — PROPOFOL 10 MG/ML IV BOLUS
INTRAVENOUS | Status: AC
  Filled 2019-10-19: qty 40

## 2019-10-19 MED ORDER — CEFAZOLIN SODIUM-DEXTROSE 2-4 GM/100ML-% IV SOLN
2.0000 g | INTRAVENOUS | Status: AC
  Administered 2019-10-19: 11:00:00 2 g via INTRAVENOUS
  Filled 2019-10-19: qty 100

## 2019-10-19 MED ORDER — BUPIVACAINE HCL (PF) 0.5 % IJ SOLN
INTRAMUSCULAR | Status: DC | PRN
  Administered 2019-10-19: 10 mL

## 2019-10-19 MED ORDER — ONDANSETRON HCL 4 MG/2ML IJ SOLN
INTRAMUSCULAR | Status: DC | PRN
  Administered 2019-10-19: 4 mg via INTRAVENOUS

## 2019-10-19 MED ORDER — FENTANYL CITRATE (PF) 100 MCG/2ML IJ SOLN
INTRAMUSCULAR | Status: AC
  Filled 2019-10-19: qty 2

## 2019-10-19 MED ORDER — KETOROLAC TROMETHAMINE 30 MG/ML IJ SOLN
30.0000 mg | Freq: Once | INTRAMUSCULAR | Status: AC
  Administered 2019-10-19: 30 mg via INTRAVENOUS
  Filled 2019-10-19: qty 1

## 2019-10-19 MED ORDER — CHLORHEXIDINE GLUCONATE CLOTH 2 % EX PADS: 6.0000 | MEDICATED_PAD | Freq: Once | CUTANEOUS | Status: DC

## 2019-10-19 MED ORDER — PROPOFOL 10 MG/ML IV BOLUS
INTRAVENOUS | Status: DC | PRN
  Administered 2019-10-19: 150 mg via INTRAVENOUS

## 2019-10-19 MED ORDER — LIDOCAINE HCL (PF) 2 % IJ SOLN
INTRAMUSCULAR | Status: AC
  Filled 2019-10-19: qty 10

## 2019-10-19 MED ORDER — TRAMADOL HCL 50 MG PO TABS: 50.0000 mg | ORAL_TABLET | Freq: Four times a day (QID) | ORAL | 0 refills | Status: DC | PRN

## 2019-10-19 MED ORDER — 0.9 % SODIUM CHLORIDE (POUR BTL) OPTIME
TOPICAL | Status: DC | PRN
  Administered 2019-10-19: 1000 mL

## 2019-10-19 MED ORDER — PHENYLEPHRINE 40 MCG/ML (10ML) SYRINGE FOR IV PUSH (FOR BLOOD PRESSURE SUPPORT)
PREFILLED_SYRINGE | INTRAVENOUS | Status: AC
  Filled 2019-10-19: qty 10

## 2019-10-19 MED ORDER — BUPIVACAINE HCL (PF) 0.5 % IJ SOLN
INTRAMUSCULAR | Status: AC
  Filled 2019-10-19: qty 30

## 2019-10-19 MED ORDER — ONDANSETRON HCL 4 MG/2ML IJ SOLN
INTRAMUSCULAR | Status: AC
  Filled 2019-10-19: qty 2

## 2019-10-19 MED ORDER — LIDOCAINE HCL (CARDIAC) PF 100 MG/5ML IV SOSY
PREFILLED_SYRINGE | INTRAVENOUS | Status: DC | PRN
  Administered 2019-10-19: 60 mg via INTRAVENOUS

## 2019-10-19 MED ORDER — FENTANYL CITRATE (PF) 100 MCG/2ML IJ SOLN
INTRAMUSCULAR | Status: DC | PRN
  Administered 2019-10-19: 100 ug via INTRAVENOUS

## 2019-10-19 MED ORDER — PROMETHAZINE HCL 25 MG/ML IJ SOLN: 6.2500 mg | INTRAMUSCULAR | Status: DC | PRN

## 2019-10-19 SURGICAL SUPPLY — 28 items
APL PRP STRL LF DISP 70% ISPRP (MISCELLANEOUS) ×1
BLADE SURG 15 STRL LF DISP TIS (BLADE) ×1 IMPLANT
BLADE SURG 15 STRL SS (BLADE) ×2
CHLORAPREP W/TINT 26 (MISCELLANEOUS) ×3 IMPLANT
CLOTH BEACON ORANGE TIMEOUT ST (SAFETY) ×3 IMPLANT
COVER LIGHT HANDLE STERIS (MISCELLANEOUS) ×6 IMPLANT
COVER WAND RF STERILE (DRAPES) ×3 IMPLANT
DECANTER SPIKE VIAL GLASS SM (MISCELLANEOUS) ×3 IMPLANT
DERMABOND ADVANCED (GAUZE/BANDAGES/DRESSINGS) ×2
DERMABOND ADVANCED .7 DNX12 (GAUZE/BANDAGES/DRESSINGS) ×1 IMPLANT
ELECT REM PT RETURN 9FT ADLT (ELECTROSURGICAL) ×3
ELECTRODE REM PT RTRN 9FT ADLT (ELECTROSURGICAL) ×1 IMPLANT
GLOVE BIOGEL PI IND STRL 7.0 (GLOVE) ×2 IMPLANT
GLOVE BIOGEL PI INDICATOR 7.0 (GLOVE) ×4
GLOVE SURG SS PI 7.5 STRL IVOR (GLOVE) ×6 IMPLANT
GOWN STRL REUS W/TWL LRG LVL3 (GOWN DISPOSABLE) ×9 IMPLANT
KIT TURNOVER KIT A (KITS) ×3 IMPLANT
MANIFOLD NEPTUNE II (INSTRUMENTS) ×3 IMPLANT
NEEDLE HYPO 25X1 1.5 SAFETY (NEEDLE) ×3 IMPLANT
NS IRRIG 1000ML POUR BTL (IV SOLUTION) ×3 IMPLANT
PACK MINOR (CUSTOM PROCEDURE TRAY) ×3 IMPLANT
PAD ARMBOARD 7.5X6 YLW CONV (MISCELLANEOUS) ×3 IMPLANT
SET BASIN LINEN APH (SET/KITS/TRAYS/PACK) ×3 IMPLANT
SUT MNCRL AB 4-0 PS2 18 (SUTURE) ×3 IMPLANT
SUT SILK 2 0 SH (SUTURE) ×3 IMPLANT
SUT VIC AB 3-0 SH 27 (SUTURE) ×2
SUT VIC AB 3-0 SH 27X BRD (SUTURE) ×1 IMPLANT
SYR CONTROL 10ML LL (SYRINGE) ×3 IMPLANT

## 2019-10-19 NOTE — Progress Notes (Signed)
To mammogram for needle loc.

## 2019-10-19 NOTE — Interval H&P Note (Signed)
History and Physical Interval Note:  10/19/2019 10:01 AM  Anita Warren  has presented today for surgery, with the diagnosis of left breast papilloma.  The various methods of treatment have been discussed with the patient and family. After consideration of risks, benefits and other options for treatment, the patient has consented to  Procedure(s): BREAST BIOPSY WITH NEEDLE LOCALIZATION (Left) as a surgical intervention.  The patient's history has been reviewed, patient examined, no change in status, stable for surgery.  I have reviewed the patient's chart and labs.  Questions were answered to the patient's satisfaction.     Aviva Signs

## 2019-10-19 NOTE — Transfer of Care (Signed)
Immediate Anesthesia Transfer of Care Note  Patient: Anita Warren  Procedure(s) Performed: BREAST BIOPSY WITH NEEDLE LOCALIZATION (Left Breast)  Patient Location: PACU  Anesthesia Type:General  Level of Consciousness: awake, alert , oriented and patient cooperative  Airway & Oxygen Therapy: Patient Spontanous Breathing  Post-op Assessment: Report given to RN, Post -op Vital signs reviewed and stable and Patient moving all extremities X 4  Post vital signs: Reviewed and stable  Last Vitals:  Vitals Value Taken Time  BP    Temp    Pulse    Resp    SpO2      Last Pain:  Vitals:   10/19/19 0900  PainSc: 0-No pain         Complications: No apparent anesthesia complications

## 2019-10-19 NOTE — Anesthesia Postprocedure Evaluation (Signed)
Anesthesia Post Note  Patient: Anita Warren  Procedure(s) Performed: BREAST BIOPSY WITH NEEDLE LOCALIZATION (Left Breast)  Patient location during evaluation: PACU Anesthesia Type: General Level of consciousness: awake, awake and alert, oriented and patient cooperative Pain management: pain level controlled Vital Signs Assessment: post-procedure vital signs reviewed and stable Respiratory status: spontaneous breathing, respiratory function stable and nonlabored ventilation Cardiovascular status: stable Postop Assessment: no apparent nausea or vomiting Anesthetic complications: no     Last Vitals:  Vitals:   10/19/19 0900 10/19/19 1134  BP: 123/79 129/67  Pulse: 67 69  Resp: 18 19  Temp: 36.8 C 36.4 C  SpO2: 97%     Last Pain:  Vitals:   10/19/19 0900  PainSc: 0-No pain                 Chuck Caban

## 2019-10-19 NOTE — Anesthesia Preprocedure Evaluation (Signed)
Anesthesia Evaluation  Patient identified by MRN, date of birth, ID band Patient awake    Reviewed: Allergy & Precautions, NPO status , Patient's Chart, lab work & pertinent test results, reviewed documented beta blocker date and time   Airway Mallampati: II  TM Distance: >3 FB Neck ROM: Full    Dental no notable dental hx. (+) Teeth Intact   Pulmonary neg pulmonary ROS,    Pulmonary exam normal breath sounds clear to auscultation       Cardiovascular Exercise Tolerance: Good hypertension, Pt. on medications and Pt. on home beta blockers +CHF  Normal cardiovascular exam+ Cardiac Defibrillator I Rhythm:Regular Rate:Normal  Reports good ET Has AICD placed 02/2011-never fired  Does eliptoical for ~15 min mult x/week  Last EF ~35-40 in 02/2018 Denies recent CP or MI Uses imdur daily  Denies any SL NTG use  WILL PLACE MAGNET to turn off during surg    Neuro/Psych  Headaches, negative psych ROS   GI/Hepatic negative GI ROS, Neg liver ROS,   Endo/Other  negative endocrine ROS  Renal/GU Renal InsufficiencyRenal diseaseH/o mult stones in the past   negative genitourinary   Musculoskeletal negative musculoskeletal ROS (+)   Abdominal   Peds negative pediatric ROS (+)  Hematology negative hematology ROS (+)   Anesthesia Other Findings   Reproductive/Obstetrics negative OB ROS                             Anesthesia Physical Anesthesia Plan  ASA: III  Anesthesia Plan: General   Post-op Pain Management:    Induction: Intravenous  PONV Risk Score and Plan: 3 and Ondansetron, Treatment may vary due to age or medical condition, Dexamethasone and Midazolam  Airway Management Planned: LMA  Additional Equipment:   Intra-op Plan:   Post-operative Plan:   Informed Consent: I have reviewed the patients History and Physical, chart, labs and discussed the procedure including the risks, benefits  and alternatives for the proposed anesthesia with the patient or authorized representative who has indicated his/her understanding and acceptance.     Dental advisory given  Plan Discussed with: CRNA  Anesthesia Plan Comments: (Plan Full PPE use  Plan GA (LMA) with GETA as needed d/w pt -WTP with same after Q&A  Will PLACE MAGNET on AICD toturn off during surgey)        Anesthesia Quick Evaluation

## 2019-10-19 NOTE — Op Note (Signed)
Patient:  Anita Warren  DOB:  23-Oct-1961  MRN:  [id number]   Preop Diagnosis: Papilloma of left breast  Postop Diagnosis: Same  Procedure: Left breast biopsy after needle localization  Surgeon: Aviva Signs, MD  Anes: General  Indications: Patient is a 58 year old black female who presents with a left breast papilloma.  Due to the possible risk of malignancy, the patient now presents for left breast biopsy after needle localization.  The risks and benefits of the procedure including bleeding, infection, the possibility of malignancy were fully explained to the patient, who gave informed consent.  Patient stopped her Eliquis 2 days ago.  The patient's AICD will be turned off during the procedure.  Procedure note: The patient was placed in the supine position after undergoing needle localization in the radiology department.  After general anesthesia was administered, the left breast was prepped and draped using the usual sterile technique with ChloraPrep.  Surgical site confirmation was performed.  An incision was made in the lower, inner quadrant of the left breast in close proximity to the guidewire.  The guidewire was then incorporated to the incision and the dissection was taken down to the area of concern.  A short suture was placed superiorly and a long suture placed laterally for orientation purposes.  The area was removed and sent to radiology for specimen x-ray.  The clip was within the specimen removed.  It was then sent to pathology further examination.  A bleeding was controlled using Bovie electrocautery.  0.5% Sensorcaine was instilled into the surrounding wound.  The skin was closed using a 4-0 Monocryl subcuticular suture.  Dermabond was applied.  All tape and needle counts were correct at the end of the procedure.  The patient was awakened and transferred to PACU in stable condition.  Complications: None  EBL: Minimal  Specimen: Left breast biopsy

## 2019-10-19 NOTE — Progress Notes (Signed)
ICD remote 

## 2019-10-19 NOTE — Discharge Instructions (Signed)
Breast Biopsy, Care After This sheet gives you information about how to care for yourself after your procedure. Your health care provider may also give you more specific instructions. If you have problems or questions, contact your health care provider. What can I expect after the procedure? After your procedure, it is common to have:  Bruising on your breast.  Numbness, tingling, or pain near your biopsy site. Follow these instructions at home: Medicines  Take over-the-counter and prescription medicines only as told by your health care provider.  Do not drive for 24 hours if you were given a sedative during your procedure.  Do not drink alcohol while taking pain medicine.  Do not drive or use heavy machinery while taking prescription pain medicine. Biopsy site care      Follow instructions from your health care provider about how to take care of your incision or puncture site. Make sure you: ? Wash your hands with soap and water before you change your bandage (dressing). If soap and water are not available, use hand sanitizer. ? Change your dressing as told by your health care provider. ? Leave any stitches (sutures), skin glue, or adhesive strips in place. These skin closures may need to stay in place for 2 weeks or longer. If adhesive strip edges start to loosen and curl up, you may trim the loose edges. Do not remove adhesive strips completely unless your health care provider tells you to do that.  If you have sutures, keep them dry when bathing.  Check your incision or puncture area every day for signs of infection. Check for: ? Redness, swelling, or pain. ? Fluid or blood. ? Warmth. ? Pus or a bad smell.  Protect the biopsy area. Do not let the area get bumped. Activity  If you had an incision during your procedure, avoid activities that may pull the incision site open. Avoid stretching, reaching above your head, exercise, sports, or lifting anything that is heavier than  3 lb (1.4 kg).  Return to your normal activities as told by your health care provider. Ask your health care provider what activities are safe for you. Managing pain  If directed, put ice on the biopsy site to relieve tenderness: ? Put ice in a plastic bag. ? Place a towel between your skin and the bag. ? Leave the ice on for 20 minutes, 2-3 times a day. General instructions  Resume your usual diet.  Wear a good support bra for as long as told by your health care provider.  Get checked for extra fluid around your lymph nodes (lymphedema) as often as told by your health care provider.  Keep all follow-up visits as told by your health care provider. This is important. Contact a health care provider if:  You have more redness, swelling, or pain at the biopsy site.  You have more fluid or blood coming from your biopsy site.  Your biopsy site feels warm to the touch.  You have pus or a bad smell coming from the biopsy site.  Your biopsy site breaks open after the sutures or adhesive strips have been removed.  You have a rash.  You have a fever. Get help right away if you have:  Increased bleeding (more than a small spot) from the biopsy site.  Difficulty breathing.  Red streaks around the biopsy site. Summary  After your procedure, it is common to have bruising, numbness, tingling, or pain near the biopsy site.  Do not drive or use heavy machinery  while taking prescription pain medicine.  Wear a good support bra for as long as told by your health care provider.  If you had an incision during your procedure, avoid activities that may pull the incision site open. Ask your health care provider what activities are safe for you. This information is not intended to replace advice given to you by your health care provider. Make sure you discuss any questions you have with your health care provider. Document Released: 05/02/2005 Document Revised: 03/31/2018 Document Reviewed:  03/31/2018 Elsevier Patient Education  [place], Adult Taking care of your wound properly can help to prevent pain, infection, and scarring. It can also help your wound to heal more quickly. How to care for your wound Wound care      Follow instructions from your health care provider about how to take care of your wound. Make sure you: ? Wash your hands with soap and water before you change the bandage (dressing). If soap and water are not available, use hand sanitizer. ? Change your dressing as told by your health care provider. ? Leave stitches (sutures), skin glue, or adhesive strips in place. These skin closures may need to stay in place for 2 weeks or longer. If adhesive strip edges start to loosen and curl up, you may trim the loose edges. Do not remove adhesive strips completely unless your health care provider tells you to do that.  Check your wound area every day for signs of infection. Check for: ? Redness, swelling, or pain. ? Fluid or blood. ? Warmth. ? Pus or a bad smell.  Ask your health care provider if you should clean the wound with mild soap and water. Doing this may include: ? Using a clean towel to pat the wound dry after cleaning it. Do not rub or scrub the wound. ? Applying a cream or ointment. Do this only as told by your health care provider. ? Covering the incision with a clean dressing.  Ask your health care provider when you can leave the wound uncovered.  Keep the dressing dry until your health care provider says it can be removed. Do not take baths, swim, use a hot tub, or do anything that would put the wound underwater until your health care provider approves. Ask your health care provider if you can take showers. You may only be allowed to take sponge baths. Medicines   If you were prescribed an antibiotic medicine, cream, or ointment, take or use the antibiotic as told by your health care provider. Do not stop taking or using  the antibiotic even if your condition improves.  Take over-the-counter and prescription medicines only as told by your health care provider. If you were prescribed pain medicine, take it 30 or more minutes before you do any wound care or as told by your health care provider. General instructions  Return to your normal activities as told by your health care provider. Ask your health care provider what activities are safe.  Do not scratch or pick at the wound.  Do not use any products that contain nicotine or tobacco, such as cigarettes and e-cigarettes. These may delay wound healing. If you need help quitting, ask your health care provider.  Keep all follow-up visits as told by your health care provider. This is important.  Eat a diet that includes protein, vitamin A, vitamin C, and other nutrient-rich foods to help the wound heal. ? Foods rich in protein include meat, dairy,  beans, nuts, and other sources. ? Foods rich in vitamin A include carrots and dark green, leafy vegetables. ? Foods rich in vitamin C include citrus, tomatoes, and other fruits and vegetables. ? Nutrient-rich foods have protein, carbohydrates, fat, vitamins, or minerals. Eat a variety of healthy foods including vegetables, fruits, and whole grains. Contact a health care provider if:  You received a tetanus shot and you have swelling, severe pain, redness, or bleeding at the injection site.  Your pain is not controlled with medicine.  You have redness, swelling, or pain around the wound.  You have fluid or blood coming from the wound.  Your wound feels warm to the touch.  You have pus or a bad smell coming from the wound.  You have a fever or chills.  You are nauseous or you vomit.  You are dizzy. Get help right away if:  You have a red streak going away from your wound.  The edges of the wound open up and separate.  Your wound is bleeding, and the bleeding does not stop with gentle pressure.  You have  a rash.  You faint.  You have trouble breathing. Summary  Always wash your hands with soap and water before changing your bandage (dressing).  To help with healing, eat foods that are rich in protein, vitamin A, vitamin C, and other nutrients.  Check your wound every day for signs of infection. Contact your health care provider if you suspect that your wound is infected. This information is not intended to replace advice given to you by your health care provider. Make sure you discuss any questions you have with your health care provider. Document Released: 07/22/2008 Document Revised: 01/31/2019 Document Reviewed: 04/29/2016 Elsevier Patient Education  2020 [employer].

## 2019-10-19 NOTE — Anesthesia Procedure Notes (Signed)
Procedure Name: LMA Insertion Date/Time: 10/19/2019 10:48 AM Performed by: Jonna Munro, CRNA Pre-anesthesia Checklist: Patient identified, Emergency Drugs available, Suction available, Patient being monitored and Timeout performed Patient Re-evaluated:Patient Re-evaluated prior to induction Oxygen Delivery Method: Circle system utilized Preoxygenation: Pre-oxygenation with 100% oxygen Induction Type: IV induction LMA: LMA inserted LMA Size: 4.0 Number of attempts: 1 Dental Injury: Teeth and Oropharynx as per pre-operative assessment

## 2019-10-19 NOTE — Progress Notes (Signed)
Returned from mammogram. Continued to prepare for surgery.

## 2019-10-25 LAB — SURGICAL PATHOLOGY

## 2019-10-25 NOTE — Addendum Note (Signed)
Addendum  created 10/25/19 0736 by Ollen Bowl, CRNA   Charge Capture section accepted

## 2019-10-26 ENCOUNTER — Telehealth: Payer: Self-pay | Admitting: General Surgery

## 2019-10-26 ENCOUNTER — Telehealth (INDEPENDENT_AMBULATORY_CARE_PROVIDER_SITE_OTHER): Payer: Self-pay | Admitting: General Surgery

## 2019-10-26 ENCOUNTER — Other Ambulatory Visit: Payer: Self-pay

## 2019-10-26 DIAGNOSIS — Z09 Encounter for follow-up examination after completed treatment for conditions other than malignant neoplasm: Secondary | ICD-10-CM

## 2019-10-26 NOTE — Telephone Encounter (Signed)
Virtual postoperative visit performed.  Patient states she is doing well.  She has no breast pain.  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  Patient was told of these results.  I did tell her that given the multifocal nature, she may have other papillomas still in the left breast.  She is fine with that.  Follow-up as needed.

## 2019-10-27 DIAGNOSIS — N183 Chronic kidney disease, stage 3 unspecified: Secondary | ICD-10-CM | POA: Diagnosis not present

## 2019-10-27 DIAGNOSIS — I5022 Chronic systolic (congestive) heart failure: Secondary | ICD-10-CM | POA: Diagnosis not present

## 2019-10-27 DIAGNOSIS — I428 Other cardiomyopathies: Secondary | ICD-10-CM | POA: Diagnosis not present

## 2019-10-27 DIAGNOSIS — I130 Hypertensive heart and chronic kidney disease with heart failure and stage 1 through stage 4 chronic kidney disease, or unspecified chronic kidney disease: Secondary | ICD-10-CM | POA: Diagnosis not present

## 2019-11-08 ENCOUNTER — Telehealth: Payer: Self-pay

## 2019-11-08 NOTE — Telephone Encounter (Signed)
Left message for patient to remind of missed remote transmission.  

## 2019-11-16 ENCOUNTER — Other Ambulatory Visit (HOSPITAL_COMMUNITY): Payer: Self-pay | Admitting: Cardiology

## 2019-11-22 NOTE — Progress Notes (Signed)
No ICM remote transmission received for 11/08/2019 and next ICM transmission scheduled for 11/29/2019.

## 2019-11-29 ENCOUNTER — Ambulatory Visit (INDEPENDENT_AMBULATORY_CARE_PROVIDER_SITE_OTHER): Payer: Medicare Other

## 2019-11-29 ENCOUNTER — Telehealth: Payer: Self-pay

## 2019-11-29 DIAGNOSIS — Z95810 Presence of automatic (implantable) cardiac defibrillator: Secondary | ICD-10-CM | POA: Diagnosis not present

## 2019-11-29 DIAGNOSIS — I5022 Chronic systolic (congestive) heart failure: Secondary | ICD-10-CM | POA: Diagnosis not present

## 2019-11-29 NOTE — Telephone Encounter (Signed)
Left message for patient to remind of missed remote transmission.  

## 2019-12-02 NOTE — Progress Notes (Signed)
EPIC Encounter for ICM Monitoring  Patient Name: Anita Warren is a 59 y.o. female Date: 12/02/2019 Primary Care Physican: Redmond School, MD Primary Cardiologist: [name]  Electrophysiologist: Allred 2/5/2021Weight:184lbs   Spoke with patient.  She denies fluid symptoms.   CorvueThoracic impedance normal.  Prescribed:Furosemide 40 mg 1 tabletdaily. Potassium 20 mEq 1tablet daily.   Labs: 12/06/2018 Creatinine 1.36, BUN 17, Potassium 4.2, Sodium 138, GFR 43-50  Recommendations:   Encouraged to call if experiencing fluid symptoms.  Follow-up plan: ICM clinic phone appointment on 01/03/2020.   91 day device clinic remote transmission 12/26/2019.    Copy of ICM check sent to Dr. Rayann Heman.   3 month ICM trend: 11/30/2019    1 Year ICM trend:       Rosalene Billings, RN 12/02/2019 10:52 AM

## 2019-12-16 ENCOUNTER — Encounter: Payer: Medicare Other | Admitting: Student

## 2019-12-25 DIAGNOSIS — N183 Chronic kidney disease, stage 3 unspecified: Secondary | ICD-10-CM | POA: Diagnosis not present

## 2019-12-25 DIAGNOSIS — I5022 Chronic systolic (congestive) heart failure: Secondary | ICD-10-CM | POA: Diagnosis not present

## 2019-12-25 DIAGNOSIS — I428 Other cardiomyopathies: Secondary | ICD-10-CM | POA: Diagnosis not present

## 2019-12-25 DIAGNOSIS — I130 Hypertensive heart and chronic kidney disease with heart failure and stage 1 through stage 4 chronic kidney disease, or unspecified chronic kidney disease: Secondary | ICD-10-CM | POA: Diagnosis not present

## 2019-12-26 ENCOUNTER — Ambulatory Visit (INDEPENDENT_AMBULATORY_CARE_PROVIDER_SITE_OTHER): Payer: Medicare Other | Admitting: *Deleted

## 2019-12-26 DIAGNOSIS — I5022 Chronic systolic (congestive) heart failure: Secondary | ICD-10-CM | POA: Diagnosis not present

## 2019-12-26 LAB — CUP PACEART REMOTE DEVICE CHECK
Battery Remaining Longevity: 35 mo
Battery Remaining Percentage: 29 %
Battery Voltage: 2.84 V
Brady Statistic RV Percent Paced: 1 %
Date Time Interrogation Session: 20210301042043
HighPow Impedance: 40 Ohm
Implantable Lead Implant Date: 20120511
Implantable Lead Location: 753860
Implantable Lead Model: 7120
Implantable Pulse Generator Implant Date: 20120511
Lead Channel Impedance Value: 490 Ohm
Lead Channel Pacing Threshold Amplitude: 1 V
Lead Channel Pacing Threshold Pulse Width: 0.5 ms
Lead Channel Sensing Intrinsic Amplitude: 11.7 mV
Lead Channel Setting Pacing Amplitude: 2.5 V
Lead Channel Setting Pacing Pulse Width: 0.5 ms
Lead Channel Setting Sensing Sensitivity: 0.5 mV
Pulse Gen Serial Number: 634653

## 2019-12-26 NOTE — Progress Notes (Signed)
ICD Remote  

## 2019-12-30 ENCOUNTER — Other Ambulatory Visit: Payer: Self-pay | Admitting: Cardiology

## 2020-01-03 ENCOUNTER — Ambulatory Visit (INDEPENDENT_AMBULATORY_CARE_PROVIDER_SITE_OTHER): Payer: Medicare Other

## 2020-01-03 DIAGNOSIS — I5022 Chronic systolic (congestive) heart failure: Secondary | ICD-10-CM

## 2020-01-03 DIAGNOSIS — Z95810 Presence of automatic (implantable) cardiac defibrillator: Secondary | ICD-10-CM

## 2020-01-03 NOTE — Progress Notes (Signed)
EPIC Encounter for ICM Monitoring  Patient Name: Anita Warren is a 59 y.o. female Date: 01/03/2020 Primary Care Physican: Redmond School, MD Primary Cardiologist: [name]  Electrophysiologist: Allred 3/9/2021Weight:184lbs   Spoke with patient.  She denies fluid symptoms.   CorvueThoracic impedance normal.  Prescribed:Furosemide 40 mg 1 tabletdaily. Potassium 20 mEq 1tablet daily.   Labs: 12/06/2018 Creatinine 1.36, BUN 17, Potassium 4.2, Sodium 138, GFR 43-50  Recommendations:Encouraged to call if experiencing fluid symptoms.  Follow-up plan: ICM clinic phone appointment on 02/06/2020. 91 day device clinic remote transmission 03/27/2020.   Copy of ICM check sent to Dr.Allred.   3 month ICM trend: 01/03/2020    1 Year ICM trend:       Rosalene Billings, RN 01/03/2020 4:09 PM

## 2020-02-10 ENCOUNTER — Ambulatory Visit (INDEPENDENT_AMBULATORY_CARE_PROVIDER_SITE_OTHER): Payer: Medicare Other

## 2020-02-10 DIAGNOSIS — Z95810 Presence of automatic (implantable) cardiac defibrillator: Secondary | ICD-10-CM

## 2020-02-10 DIAGNOSIS — I5022 Chronic systolic (congestive) heart failure: Secondary | ICD-10-CM | POA: Diagnosis not present

## 2020-02-10 NOTE — Progress Notes (Signed)
EPIC Encounter for ICM Monitoring  Patient Name: Anita Warren is a 59 y.o. female Date: 02/10/2020 Primary Care Physican: Redmond School, MD Primary Cardiologist: [name]  Electrophysiologist: Allred 3/9/2021Weight:184lbs   Spoke with patient. She is having stomach problems and is not drinking a lot of fluids at this time.  She has a GI appt 03/02/2020.  Discussed drinking enough fluids to stay hydrated.  CorvueThoracic impedancesuggesting dryness since 02/05/2020.  Prescribed:Furosemide 40 mg 1 tabletdaily. Potassium 20 mEq 1tablet daily.   Labs: 12/06/2018 Creatinine 1.36, BUN 17, Potassium 4.2, Sodium 138, GFR 43-50  Recommendations:Reviewed dehydration symptoms to report and advised to call if develops any changes.   Follow-up plan: ICM clinic phone appointment on 03/12/2020. 91 day device clinic remote transmission 03/27/2020.   Copy of ICM check sent to Dr.Allred.  3 month ICM trend: 02/09/2020    1 Year ICM trend:       Rosalene Billings, RN 02/10/2020 4:37 PM

## 2020-02-20 ENCOUNTER — Other Ambulatory Visit (HOSPITAL_COMMUNITY): Payer: Self-pay | Admitting: Cardiology

## 2020-02-22 ENCOUNTER — Other Ambulatory Visit (HOSPITAL_COMMUNITY): Payer: Self-pay | Admitting: Cardiology

## 2020-02-24 DIAGNOSIS — I130 Hypertensive heart and chronic kidney disease with heart failure and stage 1 through stage 4 chronic kidney disease, or unspecified chronic kidney disease: Secondary | ICD-10-CM | POA: Diagnosis not present

## 2020-02-24 DIAGNOSIS — N183 Chronic kidney disease, stage 3 unspecified: Secondary | ICD-10-CM | POA: Diagnosis not present

## 2020-02-24 DIAGNOSIS — I5022 Chronic systolic (congestive) heart failure: Secondary | ICD-10-CM | POA: Diagnosis not present

## 2020-02-24 DIAGNOSIS — I428 Other cardiomyopathies: Secondary | ICD-10-CM | POA: Diagnosis not present

## 2020-03-08 ENCOUNTER — Other Ambulatory Visit (HOSPITAL_COMMUNITY): Payer: Self-pay | Admitting: Cardiology

## 2020-03-12 ENCOUNTER — Ambulatory Visit (INDEPENDENT_AMBULATORY_CARE_PROVIDER_SITE_OTHER): Payer: Medicare Other

## 2020-03-12 DIAGNOSIS — I5022 Chronic systolic (congestive) heart failure: Secondary | ICD-10-CM | POA: Diagnosis not present

## 2020-03-12 DIAGNOSIS — Z95810 Presence of automatic (implantable) cardiac defibrillator: Secondary | ICD-10-CM

## 2020-03-12 NOTE — Progress Notes (Signed)
EPIC Encounter for ICM Monitoring  Patient Name: Anita Warren is a 59 y.o. female Date: 03/12/2020 Primary Care Physican: Redmond School, MD Primary Cardiologist: [name]  Electrophysiologist: Allred 5/17/2021Weight:182lbs   Spoke with patient and reports feeling well at this time.  Denies fluid symptoms.    CorvueThoracic impedancenormal  Prescribed:Furosemide 40 mg 1 tabletdaily. Potassium 20 mEq 1tablet daily.   Labs: 12/06/2018 Creatinine 1.36, BUN 17, Potassium 4.2, Sodium 138, GFR 43-50  Recommendations:  No changes and encouraged to call if experiencing any fluid symptoms.  Follow-up plan: ICM clinic phone appointment on 04/16/2020. 91 day device clinic remote transmission6/10/2019.   Copy of ICM check sent to Dr.Allred.  3 month ICM trend: 03/12/2020    1 Year ICM trend:       Rosalene Billings, RN 03/12/2020 4:09 PM

## 2020-03-27 ENCOUNTER — Ambulatory Visit (INDEPENDENT_AMBULATORY_CARE_PROVIDER_SITE_OTHER): Payer: Medicare Other | Admitting: *Deleted

## 2020-03-27 DIAGNOSIS — I428 Other cardiomyopathies: Secondary | ICD-10-CM | POA: Diagnosis not present

## 2020-03-27 LAB — CUP PACEART REMOTE DEVICE CHECK
Battery Remaining Longevity: 32 mo
Battery Remaining Percentage: 28 %
Battery Voltage: 2.84 V
Brady Statistic RV Percent Paced: 1 %
Date Time Interrogation Session: 20210531055005
HighPow Impedance: 41 Ohm
Implantable Lead Implant Date: 20120511
Implantable Lead Location: 753860
Implantable Lead Model: 7120
Implantable Pulse Generator Implant Date: 20120511
Lead Channel Impedance Value: 580 Ohm
Lead Channel Pacing Threshold Amplitude: 1 V
Lead Channel Pacing Threshold Pulse Width: 0.5 ms
Lead Channel Sensing Intrinsic Amplitude: 11.7 mV
Lead Channel Setting Pacing Amplitude: 2.5 V
Lead Channel Setting Pacing Pulse Width: 0.5 ms
Lead Channel Setting Sensing Sensitivity: 0.5 mV
Pulse Gen Serial Number: 634653

## 2020-03-27 NOTE — Progress Notes (Signed)
Remote ICD transmission.   

## 2020-04-10 ENCOUNTER — Encounter: Payer: Self-pay | Admitting: Obstetrics & Gynecology

## 2020-04-10 DIAGNOSIS — Z13220 Encounter for screening for lipoid disorders: Secondary | ICD-10-CM | POA: Diagnosis not present

## 2020-04-10 DIAGNOSIS — E039 Hypothyroidism, unspecified: Secondary | ICD-10-CM | POA: Diagnosis not present

## 2020-04-10 DIAGNOSIS — G43909 Migraine, unspecified, not intractable, without status migrainosus: Secondary | ICD-10-CM | POA: Diagnosis not present

## 2020-04-10 DIAGNOSIS — I83811 Varicose veins of right lower extremities with pain: Secondary | ICD-10-CM | POA: Diagnosis not present

## 2020-04-10 DIAGNOSIS — I10 Essential (primary) hypertension: Secondary | ICD-10-CM | POA: Diagnosis not present

## 2020-04-10 DIAGNOSIS — I429 Cardiomyopathy, unspecified: Secondary | ICD-10-CM | POA: Diagnosis not present

## 2020-04-10 DIAGNOSIS — Z0001 Encounter for general adult medical examination with abnormal findings: Secondary | ICD-10-CM | POA: Diagnosis not present

## 2020-04-10 DIAGNOSIS — R1013 Epigastric pain: Secondary | ICD-10-CM | POA: Diagnosis not present

## 2020-04-10 DIAGNOSIS — N183 Chronic kidney disease, stage 3 unspecified: Secondary | ICD-10-CM | POA: Diagnosis not present

## 2020-04-10 DIAGNOSIS — Z1389 Encounter for screening for other disorder: Secondary | ICD-10-CM | POA: Diagnosis not present

## 2020-04-11 ENCOUNTER — Other Ambulatory Visit (HOSPITAL_COMMUNITY): Payer: Self-pay | Admitting: Internal Medicine

## 2020-04-11 DIAGNOSIS — R109 Unspecified abdominal pain: Secondary | ICD-10-CM

## 2020-04-16 ENCOUNTER — Ambulatory Visit (INDEPENDENT_AMBULATORY_CARE_PROVIDER_SITE_OTHER): Payer: Medicare Other

## 2020-04-16 DIAGNOSIS — Z95810 Presence of automatic (implantable) cardiac defibrillator: Secondary | ICD-10-CM | POA: Diagnosis not present

## 2020-04-16 DIAGNOSIS — I5022 Chronic systolic (congestive) heart failure: Secondary | ICD-10-CM | POA: Diagnosis not present

## 2020-04-17 ENCOUNTER — Telehealth (HOSPITAL_COMMUNITY): Payer: Self-pay

## 2020-04-17 ENCOUNTER — Telehealth: Payer: Self-pay

## 2020-04-17 ENCOUNTER — Encounter: Payer: Self-pay | Admitting: Internal Medicine

## 2020-04-17 NOTE — Telephone Encounter (Signed)
Spoke with patient to remind of missed remote transmission 

## 2020-04-17 NOTE — Telephone Encounter (Signed)

## 2020-04-17 NOTE — Progress Notes (Signed)
EPIC Encounter for ICM Monitoring  Patient Name: Anita Warren is a 59 y.o. female Date: 04/17/2020 Primary Care Physican: Sharilyn Sites, MD Primary Cardiologist: [name]  Electrophysiologist: Allred 6/23/2021Weight:185lbs (baseline 182 lbs)       Heart Failure questions reviewed.  Pt symptomatic with 3-4 pound weight gain in the last couple of weeks.  She said she may not have been following a low salt diet and eating things like pizza.   Coruve thoracic impedance suggesting possible fluid accumulation starting 04/12/20 but trending back toward baseline.   Prescribed:  Furosemide 40 mg take 1 tabletdaily.   Potassium 20 mEq take 1tablet daily.  Spironolactone 25 mg take 1 tablet daily   Labs: 10/17/2019 Creatinine 1.48, BUN 25, Potassium 3.7, Sodium 138, GFR 39-45  Recommendations: Reinforced limiting salt intake to < 2000 mg daily and fluid intake to 64 oz daily.  Encouraged to call if experiencing fluid symptoms.  Follow-up plan: ICM clinic phone appointment on 04/25/2020 to recheck fluid levels.   91 day device clinic remote transmission 06/25/2020.    Next office appt 06/29/2020 with Dr. Rayann Heman.    Copy of ICM check sent to Dr. Rayann Heman and Dr [name] for review and recommendations.   3 month ICM trend: 04/17/2020    1 Year ICM trend:       Rosalene Billings, RN 04/17/2020 9:20 AM

## 2020-04-18 ENCOUNTER — Ambulatory Visit (INDEPENDENT_AMBULATORY_CARE_PROVIDER_SITE_OTHER): Payer: Medicare Other | Admitting: Vascular Surgery

## 2020-04-18 ENCOUNTER — Other Ambulatory Visit: Payer: Self-pay

## 2020-04-18 ENCOUNTER — Encounter: Payer: Self-pay | Admitting: Vascular Surgery

## 2020-04-18 VITALS — BP 113/70 | HR 63 | Temp 97.7°F | Resp 16 | Ht 64.0 in | Wt 184.0 lb

## 2020-04-18 DIAGNOSIS — I83811 Varicose veins of right lower extremities with pain: Secondary | ICD-10-CM

## 2020-04-18 NOTE — Progress Notes (Signed)
She can stop Eliquis 2 days prior to procedure, does not need bridging Lovenox.  Restart after procedure.

## 2020-04-18 NOTE — Progress Notes (Signed)
Referring Physician: Dr. Gerarda Fraction  Patient name: Anita Warren MRN: [id number] DOB: May 04, 1961 Sex: female  REASON FOR CONSULT: Symptomatic varicose veins with pain right leg  HPI: Anita Warren is a 59 y.o. female with a several year history of progressive slowly worsening varicose veins in the right leg.  The patient develops symptoms of pain with pressure on the right leg skin surface and frequently is awakened at night from pain from her varicose veins in the right medial thigh.  She has no prior history of DVT.  She has no family history of varicose veins.  She describes the pain as stabbing which can last for several minutes.  This is also exacerbated if she is standing up all day long.  She has not really had any swelling.  Other medical problems include history of cardiomyopathy with ejection fraction of 35 to 40% 2019.  She also has a history of CKD 3 with baseline creatinine of about 1.5.  These have both been stable.  Past Medical History:  Diagnosis Date  . AICD (automatic cardioverter/defibrillator) present    St. Jude AICD implanted 03-07-2011 Dr. Allred/-Dr. [name] now follows  . CHF (congestive heart failure) ([place])    meds controlling, no episodes since 2014  . Chronic systolic heart failure ([place])   . Essential hypertension, benign   . Headache   . History of kidney stones    multiple kidney stones in past  . History of medication noncompliance   . Hydronephrosis with renal and ureteral calculus obstruction 09/05/2013  . Nonischemic cardiomyopathy (HCC)    LVEF 5-10%, likely viral (no CAD by cath 01/30/11)  . NSVT (nonsustained ventricular tachycardia) ([place])   . Obesity    Past Surgical History:  Procedure Laterality Date  . ABDOMINAL HYSTERECTOMY    . BREAST BIOPSY Left 10/19/2019   Procedure: BREAST BIOPSY WITH NEEDLE LOCALIZATION;  Surgeon: Aviva Signs, MD;  Location: [facility];  Service: General;  Laterality: Left;  . BREAST LUMPECTOMY  1989   L  breast- benign  . CARDIAC CATHETERIZATION    . CARDIAC DEFIBRILLATOR PLACEMENT  5/12   SJM by JA  . CESAREAN SECTION     x 2  . CHOLECYSTECTOMY    . COLONOSCOPY  07/02/2012   Procedure: COLONOSCOPY;  Surgeon: [name], MD;  [facility];  Service: Endoscopy;  Laterality: N/A;  1:15/PATIENT HAS A DEFIBRILLATOR  . CYSTOSCOPY W/ URETERAL STENT PLACEMENT Right 09/06/2013   Procedure: CYSTOSCOPY WITH RIGHT RETROGRADE PYELOGRAM; RIGHT URETERAL STENT PLACEMENT;  Surgeon: [name], MD;  Location: [facility];  Service: Urology;  Laterality: Right;  . CYSTOSCOPY W/ URETERAL STENT PLACEMENT N/A 04/28/2016   Procedure: CYSTOSCOPY WITH  RIGHT RETROGRADE PYELOGRAM/RIGHT URETERAL STENT PLACEMENT;  Surgeon: Rana Snare, MD;  [facility];  Service: Urology;  Laterality: N/A;  . CYSTOSCOPY W/ URETERAL STENT REMOVAL Right 07/21/2016   Procedure: CYSTOSCOPY WITH STENT REMOVAL;  Surgeon: Franchot Gallo, MD;  [facility];  Service: Urology;  Laterality: Right;  . CYSTOSCOPY/URETEROSCOPY/HOLMIUM LASER/STENT PLACEMENT Right 07/21/2016   Procedure: CYSTOSCOPY/URETEROSCOPY/HOLMIUM LASER/   right retrograde pylegram;  Surgeon: Franchot Gallo, MD;  [facility];  Service: Urology;  Laterality: Right;  . TONSILLECTOMY    . TUBAL LIGATION      Family History  Problem Relation Age of Onset  . Hypertension Other   . Diabetes Other   . Colon cancer Neg Hx     SOCIAL HISTORY: Social History   Socioeconomic History  .  Marital status: Legally Separated    Spouse name: Not on file  . Number of children: Not on file  . Years of education: Not on file  . Highest education level: Not on file  Occupational History  . Occupation: Research scientist (life sciences): [employer]    Comment: [employer], was at [employer] for 16 years   Tobacco Use  . Smoking status: Never Smoker  . Smokeless tobacco: Never Used  Vaping Use  . Vaping Use: Never used  Substance and Sexual Activity  . Alcohol  use: No  . Drug use: No  . Sexual activity: Yes  Other Topics Concern  . Not on file  Social History Narrative   Lives in [place] with spouse.  3 grown children.  Previously worked in [employer] at [place] Strain:   . Difficulty of Paying Living Expenses:   Food Insecurity:   . Worried About Charity fundraiser in the Last Year:   . Arboriculturist in the Last Year:   Transportation Needs:   . Film/video editor (Medical):   Marland Kitchen Lack of Transportation (Non-Medical):   Physical Activity:   . Days of Exercise per Week:   . Minutes of Exercise per Session:   Stress:   . Feeling of Stress :   Social Connections:   . Frequency of Communication with Friends and Family:   . Frequency of Social Gatherings with Friends and Family:   . Attends Religious Services:   . Active Member of Clubs or Organizations:   . Attends Archivist Meetings:   Marland Kitchen Marital Status:   Intimate Partner Violence:   . Fear of Current or Ex-Partner:   . Emotionally Abused:   Marland Kitchen Physically Abused:   . Sexually Abused:     Allergies  Allergen Reactions  . Peanut-Containing Drug Products Anaphylaxis  . Ace Inhibitors Swelling  . Morphine Nausea And Vomiting  . Demerol Rash    Current Outpatient Medications  Medication Sig Dispense Refill  . acetaminophen (TYLENOL) 500 MG tablet Take 1,000 mg by mouth every 6 (six) hours as needed for headache.     Marland Kitchen apixaban (ELIQUIS) 5 MG TABS tablet Take 1 tablet (5 mg total) by mouth 2 (two) times daily. Must be seen in office for further refills 60 tablet 0  . carvedilol (COREG) 25 MG tablet Take 1 tablet (25 mg total) by mouth 2 (two) times daily. Please call office for appointment [phone] 60 tablet 0  . Coenzyme Q10 (CO Q 10) 100 MG CAPS Take 100 mg by mouth daily.     . diphenhydrAMINE (BENADRYL) 25 MG tablet Take 25 mg by mouth every 6 (six) hours as needed (headache).    . furosemide  (LASIX) 40 MG tablet Take 1 tablet by mouth once daily 90 tablet 1  . hydrALAZINE (APRESOLINE) 100 MG tablet Take 1 tablet (100 mg total) by mouth 3 (three) times daily. Please call office for appointment [phone] 90 tablet 0  . isosorbide mononitrate (IMDUR) 30 MG 24 hr tablet Take 3 tablets (90 mg total) by mouth daily. Please call office for appointment [phone] 90 tablet 0  . Magnesium Oxide 400 (240 MG) MG TABS Take 1 tablet by mouth daily. (Patient taking differently: Take 400 mg by mouth daily. ) 90 tablet 3  . potassium chloride SA (K-DUR) 20 MEQ tablet Take 1 tablet (20 mEq total) by mouth daily.  30 tablet 3  . spironolactone (ALDACTONE) 25 MG tablet Take 1 tablet by mouth once daily 90 tablet 1  . traMADol (ULTRAM) 50 MG tablet Take 1 tablet (50 mg total) by mouth every 6 (six) hours as needed. 15 tablet 0   No current facility-administered medications for this visit.    ROS:   General:  No weight loss, Fever, chills  HEENT: No recent headaches, no nasal bleeding, no visual changes, no sore throat  Neurologic: No dizziness, blackouts, seizures. No recent symptoms of stroke or mini- stroke. No recent episodes of slurred speech, or temporary blindness.  Cardiac: No recent episodes of chest pain/pressure, no shortness of breath at rest.  No shortness of breath with exertion.  Denies history of atrial fibrillation or irregular heartbeat  Vascular: No history of rest pain in feet.  No history of claudication.  No history of non-healing ulcer, No history of DVT   Pulmonary: No home oxygen, no productive cough, no hemoptysis,  No asthma or wheezing  Musculoskeletal:  [ ]  Arthritis, [ ]  Low back pain,  [ ]  Joint pain  Hematologic:No history of hypercoagulable state.  No history of easy bleeding.  No history of anemia  Gastrointestinal: No hematochezia or melena,  No gastroesophageal reflux, no trouble swallowing  Urinary: [X]  chronic Kidney disease, [ ]  on HD - [X]  MWF or [X]   TTHS, [ ]  Burning with urination, [ ]  Frequent urination, [ ]  Difficulty urinating;   Skin: No rashes  Psychological: No history of anxiety,  No history of depression   Physical Examination  Vitals:   04/18/20 1016  BP: 113/70  Pulse: 63  Resp: 16  Temp: 97.7 F (36.5 C)  TempSrc: Temporal  SpO2: 100%  Weight: 184 lb (83.5 kg)  Height: 5\' 4"  (1.626 m)    Body mass index is 31.58 kg/m.  General:  Alert and oriented, no acute distress HEENT: Normal Neck: No JVD Cardiac: Regular Rate and Rhythm Skin: No rash, surface varicosities covering almost the entire inner aspect of the right thigh over the course of the saphenous vein slightly tender to palpation no erythema Extremity Pulses:  2+ dorsalis pedis pulses bilaterally Musculoskeletal: No deformity or edema  Neurologic: Upper and lower extremity motor 5/5 and symmetric        DATA:  ECHO EF 35-40% 2019  I did a SonoSite ultrasound exam at the bedside with the patient today.  This reveals a dilated right greater saphenous vein with vein diameter between 5 and 8 mm.  ASSESSMENT: Symptomatic varicose veins with pain right leg   PLAN: She was given a prescription today for lower extremity compression stockings thigh-high 20 to 30 mmHg.  She will try to wear these as often as possible during the day.  She was also encouraged to elevate her legs to decrease pain.  She will follow-up in 3 months time for a formal duplex ultrasound with reflux and consideration for laser ablation and stab avulsions.  If we proceed with laser ablation we will need to discuss with her cardiologist Dr. Algernon Huxley whether or not she will require Lovenox bridging and also determine when to stop her Eliquis.   Ruta Hinds, MD [facility] Office: [phone] Pager: [phone]

## 2020-04-19 ENCOUNTER — Ambulatory Visit (HOSPITAL_COMMUNITY)
Admission: RE | Admit: 2020-04-19 | Discharge: 2020-04-19 | Disposition: A | Discharge status: Home / Self Care | Payer: Medicare Other | Source: Ambulatory Visit | Attending: Internal Medicine | Admitting: Internal Medicine

## 2020-04-19 ENCOUNTER — Other Ambulatory Visit: Payer: Self-pay | Admitting: *Deleted

## 2020-04-19 DIAGNOSIS — R109 Unspecified abdominal pain: Secondary | ICD-10-CM | POA: Insufficient documentation

## 2020-04-19 DIAGNOSIS — I83811 Varicose veins of right lower extremities with pain: Secondary | ICD-10-CM

## 2020-04-19 DIAGNOSIS — R1013 Epigastric pain: Secondary | ICD-10-CM | POA: Diagnosis not present

## 2020-04-25 DIAGNOSIS — I5022 Chronic systolic (congestive) heart failure: Secondary | ICD-10-CM | POA: Diagnosis not present

## 2020-04-25 DIAGNOSIS — N183 Chronic kidney disease, stage 3 unspecified: Secondary | ICD-10-CM | POA: Diagnosis not present

## 2020-04-25 DIAGNOSIS — I428 Other cardiomyopathies: Secondary | ICD-10-CM | POA: Diagnosis not present

## 2020-04-25 DIAGNOSIS — I130 Hypertensive heart and chronic kidney disease with heart failure and stage 1 through stage 4 chronic kidney disease, or unspecified chronic kidney disease: Secondary | ICD-10-CM | POA: Diagnosis not present

## 2020-04-27 NOTE — Progress Notes (Signed)
No ICM remote transmission received for 04/25/2020 and next ICM transmission scheduled for 05/21/2020.

## 2020-05-02 ENCOUNTER — Encounter: Payer: Self-pay | Admitting: Radiology

## 2020-05-02 ENCOUNTER — Encounter: Payer: Self-pay | Admitting: Gastroenterology

## 2020-05-04 ENCOUNTER — Encounter: Payer: Self-pay | Admitting: Obstetrics & Gynecology

## 2020-05-04 ENCOUNTER — Ambulatory Visit: Payer: Medicare Other | Admitting: Obstetrics & Gynecology

## 2020-05-04 ENCOUNTER — Other Ambulatory Visit (HOSPITAL_COMMUNITY)
Admission: RE | Admit: 2020-05-04 | Discharge: 2020-05-04 | Disposition: A | Discharge status: Home / Self Care | Payer: Medicare Other | Source: Ambulatory Visit | Attending: Obstetrics & Gynecology | Admitting: Obstetrics & Gynecology

## 2020-05-04 VITALS — BP 137/84 | HR 60 | Ht 64.0 in | Wt 184.5 lb

## 2020-05-04 DIAGNOSIS — Z1151 Encounter for screening for human papillomavirus (HPV): Secondary | ICD-10-CM | POA: Insufficient documentation

## 2020-05-04 DIAGNOSIS — Z9079 Acquired absence of other genital organ(s): Secondary | ICD-10-CM

## 2020-05-04 DIAGNOSIS — Z01419 Encounter for gynecological examination (general) (routine) without abnormal findings: Secondary | ICD-10-CM | POA: Diagnosis present

## 2020-05-04 DIAGNOSIS — Z1211 Encounter for screening for malignant neoplasm of colon: Secondary | ICD-10-CM

## 2020-05-04 LAB — HEMOCCULT GUIAC POC 1CARD (OFFICE): Fecal Occult Blood, POC: NEGATIVE

## 2020-05-04 NOTE — Addendum Note (Signed)
Addended by: Linton Rump on: 05/04/2020 10:02 AM   Modules accepted: Orders

## 2020-05-04 NOTE — Progress Notes (Signed)
Subjective:     Anita Warren is a 59 y.o. female here for a routine exam.  Patient's last menstrual period was 10/28/1999. [id number] Birth Control Method:  Hysterectomy, LAVH Menstrual Calendar(currently): LAVH  Current complaints: none.   Current acute medical issues:  Cardiomyopathy   Recent Gynecologic History Patient's last menstrual period was 10/28/1999. Last Pap: ???,   Last mammogram: 11/20,  normal  Past Medical History:  Diagnosis Date   AICD (automatic cardioverter/defibrillator) present    St. Jude AICD implanted 03-07-2011 Dr. Allred/-Dr. [name] now follows   CHF (congestive heart failure) ([place])    meds controlling, no episodes since 7893   Chronic systolic heart failure ([place])    Essential hypertension, benign    Headache    History of kidney stones    multiple kidney stones in past   History of medication noncompliance    Hydronephrosis with renal and ureteral calculus obstruction 09/05/2013   Nonischemic cardiomyopathy ([place])    LVEF 5-10%, likely viral (no CAD by cath 01/30/11)   NSVT (nonsustained ventricular tachycardia) ([place])    Obesity     Past Surgical History:  Procedure Laterality Date   ABDOMINAL HYSTERECTOMY     BREAST BIOPSY Left 10/19/2019   Procedure: BREAST BIOPSY WITH NEEDLE LOCALIZATION;  Surgeon: Aviva Signs, MD;  Location: [facility];  Service: General;  Laterality: Left;   BREAST LUMPECTOMY  1989   L breast- benign   CARDIAC CATHETERIZATION     CARDIAC DEFIBRILLATOR PLACEMENT  5/12   SJM by JA   CESAREAN SECTION     x 2   CHOLECYSTECTOMY     COLONOSCOPY  07/02/2012   Procedure: COLONOSCOPY;  Surgeon: [name], MD;  [facility];  Service: Endoscopy;  Laterality: N/A;  1:15/PATIENT HAS A DEFIBRILLATOR   CYSTOSCOPY W/ URETERAL STENT PLACEMENT Right 09/06/2013   Procedure: CYSTOSCOPY WITH RIGHT RETROGRADE PYELOGRAM; RIGHT URETERAL STENT PLACEMENT;  Surgeon: [name], MD;  Location: [facility];   Service: Urology;  Laterality: Right;   CYSTOSCOPY W/ URETERAL STENT PLACEMENT N/A 04/28/2016   Procedure: CYSTOSCOPY WITH  RIGHT RETROGRADE PYELOGRAM/RIGHT URETERAL STENT PLACEMENT;  Surgeon: Rana Snare, MD;  [facility];  Service: Urology;  Laterality: N/A;   CYSTOSCOPY W/ URETERAL STENT REMOVAL Right 07/21/2016   Procedure: CYSTOSCOPY WITH STENT REMOVAL;  Surgeon: Franchot Gallo, MD;  [facility];  Service: Urology;  Laterality: Right;   CYSTOSCOPY/URETEROSCOPY/HOLMIUM LASER/STENT PLACEMENT Right 07/21/2016   Procedure: CYSTOSCOPY/URETEROSCOPY/HOLMIUM LASER/   right retrograde pylegram;  Surgeon: Franchot Gallo, MD;  [facility];  Service: Urology;  Laterality: Right;   TONSILLECTOMY     TUBAL LIGATION      OB History    Gravida  2   Para  2   Term      Preterm  2   AB      Living  3     SAB      TAB      Ectopic      Multiple  1   Live Births  3           Social History   Socioeconomic History   Marital status: Legally Separated    Spouse name: Not on file   Number of children: Not on file   Years of education: Not on file   Highest education level: Not on file  Occupational History   Occupation: Nurse Tech    Employer: [employer] [place]: [employer], was  at [employer] for 16 years   Tobacco Use   Smoking status: Never Smoker   Smokeless tobacco: Never Used  Vaping Use   Vaping Use: Never used  Substance and Sexual Activity   Alcohol use: No   Drug use: No   Sexual activity: Yes    Birth control/protection: Surgical    Comment: hyst  Other Topics Concern   Not on file  Social History Narrative   Lives in [place] with spouse.  3 grown children.  Previously worked in [employer] at [place] Strain: Low Risk    Difficulty of Paying Living Expenses: Not hard at all  Food Insecurity: No Food Insecurity   Worried About Charity fundraiser in the  Last Year: Never true   [place] in the Last Year: Never true  Transportation Needs: No Transportation Needs   Lack of Transportation (Medical): No   Lack of Transportation (Non-Medical): No  Physical Activity: Sufficiently Active   Days of Exercise per Week: 5 days   Minutes of Exercise per Session: 40 min  Stress: No Stress Concern Present   Feeling of Stress : Not at all  Social Connections: Moderately Integrated   Frequency of Communication with Friends and Family: More than three times a week   Frequency of Social Gatherings with Friends and Family: More than three times a week   Attends Religious Services: More than 4 times per year   Active Member of [employer] or Organizations: Yes   Attends Music therapist: More than 4 times per year   Marital Status: Divorced    Family History  Problem Relation Age of Onset   Diabetes Mother    Hypertension Brother    Breast cancer Sister    Diabetes Sister        borderline   Colon cancer Neg Hx      Current Outpatient Medications:    acetaminophen (TYLENOL) 500 MG tablet, Take 1,000 mg by mouth every 6 (six) hours as needed for headache. , Disp: , Rfl:    apixaban (ELIQUIS) 5 MG TABS tablet, Take 1 tablet (5 mg total) by mouth 2 (two) times daily. Must be seen in office for further refills, Disp: 60 tablet, Rfl: 0   carvedilol (COREG) 25 MG tablet, Take 1 tablet (25 mg total) by mouth 2 (two) times daily. Please call office for appointment [phone], Disp: 60 tablet, Rfl: 0   Coenzyme Q10 (CO Q 10) 100 MG CAPS, Take 100 mg by mouth daily. , Disp: , Rfl:    diphenhydrAMINE (BENADRYL) 25 MG tablet, Take 25 mg by mouth every 6 (six) hours as needed (headache)., Disp: , Rfl:    furosemide (LASIX) 40 MG tablet, Take 1 tablet by mouth once daily, Disp: 90 tablet, Rfl: 1   hydrALAZINE (APRESOLINE) 100 MG tablet, Take 1 tablet (100 mg total) by mouth 3 (three) times daily. Please call office for  appointment [phone], Disp: 90 tablet, Rfl: 0   isosorbide mononitrate (IMDUR) 30 MG 24 hr tablet, Take 3 tablets (90 mg total) by mouth daily. Please call office for appointment [phone], Disp: 90 tablet, Rfl: 0   potassium chloride SA (K-DUR) 20 MEQ tablet, Take 1 tablet (20 mEq total) by mouth daily., Disp: 30 tablet, Rfl: 3   spironolactone (ALDACTONE) 25 MG tablet, Take 1 tablet by mouth once daily, Disp: 90 tablet, Rfl: 1  Review of Systems  Review of  Systems  Constitutional: Negative for fever, chills, weight loss, malaise/fatigue and diaphoresis.  HENT: Negative for hearing loss, ear pain, nosebleeds, congestion, sore throat, neck pain, tinnitus and ear discharge.   Eyes: Negative for blurred vision, double vision, photophobia, pain, discharge and redness.  Respiratory: Negative for cough, hemoptysis, sputum production, shortness of breath, wheezing and stridor.   Cardiovascular: Negative for chest pain, palpitations, orthopnea, claudication, leg swelling and PND.  Gastrointestinal: negative for abdominal pain. Negative for heartburn, nausea, vomiting, diarrhea, constipation, blood in stool and melena.  Genitourinary: Negative for dysuria, urgency, frequency, hematuria and flank pain.  Musculoskeletal: Negative for myalgias, back pain, joint pain and falls.  Skin: Negative for itching and rash.  Neurological: Negative for dizziness, tingling, tremors, sensory change, speech change, focal weakness, seizures, loss of consciousness, weakness and headaches.  Endo/Heme/Allergies: Negative for environmental allergies and polydipsia. Does not bruise/bleed easily.  Psychiatric/Behavioral: Negative for depression, suicidal ideas, hallucinations, memory loss and substance abuse. The patient is not nervous/anxious and does not have insomnia.        Objective:  Blood pressure 137/84, pulse 60, height 5\' 4"  (1.626 m), weight 184 lb 8 oz (83.7 kg), last menstrual period 10/28/1999.    Physical Exam  Vitals reviewed. Constitutional: She is oriented to person, place, and time. She appears well-developed and well-nourished.        Vulva is normal without lesions Vagina is pink moist without discharge Cervix absent Uterus is absent Adnexa is negative with normal sized ovaries  {Rectal    hemoccult negative, normal tone, no masses  Musculoskeletal: Normal range of motion. She exhibits no edema and no tenderness.  Neurological: She is alert and oriented to person, place, and time. She has normal reflexes. She displays normal reflexes. No cranial nerve deficit. She exhibits normal muscle tone. Coordination normal.  Skin: Skin is warm and dry. No rash noted. No erythema. No pallor.  Psychiatric: She has a normal mood and affect. Her behavior is normal. Judgment and thought content normal.       Medications Ordered at today's visit: No orders of the defined types were placed in this encounter.   Other orders placed at today's visit: No orders of the defined types were placed in this encounter.     Assessment:    Normal Gyn exam.   S/P LAVH: further vaginal cytology evaluations are not indicated if this is normal, pt is S/P LAVH 2001 Plan:    Contraception: status post hysterectomy. Mammogram ordered. Follow up in: 3 years.     No follow-ups on file.

## 2020-05-09 LAB — CYTOLOGY - PAP
Comment: NEGATIVE
Diagnosis: NEGATIVE
High risk HPV: NEGATIVE

## 2020-05-21 ENCOUNTER — Ambulatory Visit (INDEPENDENT_AMBULATORY_CARE_PROVIDER_SITE_OTHER): Payer: Medicare Other

## 2020-05-21 DIAGNOSIS — I5022 Chronic systolic (congestive) heart failure: Secondary | ICD-10-CM

## 2020-05-21 DIAGNOSIS — Z95810 Presence of automatic (implantable) cardiac defibrillator: Secondary | ICD-10-CM

## 2020-05-22 ENCOUNTER — Ambulatory Visit (HOSPITAL_COMMUNITY)
Admission: RE | Admit: 2020-05-22 | Discharge: 2020-05-22 | Disposition: A | Discharge status: Home / Self Care | Payer: Medicare Other | Source: Ambulatory Visit | Attending: Cardiology | Admitting: Cardiology

## 2020-05-22 ENCOUNTER — Other Ambulatory Visit: Payer: Self-pay

## 2020-05-22 ENCOUNTER — Encounter (HOSPITAL_COMMUNITY): Payer: Self-pay | Admitting: Cardiology

## 2020-05-22 DIAGNOSIS — Z7984 Long term (current) use of oral hypoglycemic drugs: Secondary | ICD-10-CM | POA: Diagnosis not present

## 2020-05-22 DIAGNOSIS — I4892 Unspecified atrial flutter: Secondary | ICD-10-CM | POA: Diagnosis not present

## 2020-05-22 DIAGNOSIS — I252 Old myocardial infarction: Secondary | ICD-10-CM | POA: Diagnosis not present

## 2020-05-22 DIAGNOSIS — I5022 Chronic systolic (congestive) heart failure: Secondary | ICD-10-CM | POA: Insufficient documentation

## 2020-05-22 DIAGNOSIS — T783XXA Angioneurotic edema, initial encounter: Secondary | ICD-10-CM | POA: Insufficient documentation

## 2020-05-22 DIAGNOSIS — Z8249 Family history of ischemic heart disease and other diseases of the circulatory system: Secondary | ICD-10-CM | POA: Insufficient documentation

## 2020-05-22 DIAGNOSIS — Z7901 Long term (current) use of anticoagulants: Secondary | ICD-10-CM | POA: Insufficient documentation

## 2020-05-22 DIAGNOSIS — Z91120 Patient's intentional underdosing of medication regimen due to financial hardship: Secondary | ICD-10-CM | POA: Insufficient documentation

## 2020-05-22 DIAGNOSIS — R9431 Abnormal electrocardiogram [ECG] [EKG]: Secondary | ICD-10-CM | POA: Diagnosis not present

## 2020-05-22 DIAGNOSIS — X58XXXA Exposure to other specified factors, initial encounter: Secondary | ICD-10-CM | POA: Diagnosis not present

## 2020-05-22 DIAGNOSIS — I130 Hypertensive heart and chronic kidney disease with heart failure and stage 1 through stage 4 chronic kidney disease, or unspecified chronic kidney disease: Secondary | ICD-10-CM | POA: Insufficient documentation

## 2020-05-22 DIAGNOSIS — Z87442 Personal history of urinary calculi: Secondary | ICD-10-CM | POA: Insufficient documentation

## 2020-05-22 DIAGNOSIS — N183 Chronic kidney disease, stage 3 unspecified: Secondary | ICD-10-CM | POA: Diagnosis not present

## 2020-05-22 DIAGNOSIS — E669 Obesity, unspecified: Secondary | ICD-10-CM | POA: Diagnosis not present

## 2020-05-22 DIAGNOSIS — Z79899 Other long term (current) drug therapy: Secondary | ICD-10-CM | POA: Diagnosis not present

## 2020-05-22 DIAGNOSIS — Z6831 Body mass index (BMI) 31.0-31.9, adult: Secondary | ICD-10-CM | POA: Insufficient documentation

## 2020-05-22 DIAGNOSIS — Z95810 Presence of automatic (implantable) cardiac defibrillator: Secondary | ICD-10-CM | POA: Insufficient documentation

## 2020-05-22 DIAGNOSIS — I428 Other cardiomyopathies: Secondary | ICD-10-CM | POA: Diagnosis not present

## 2020-05-22 LAB — BASIC METABOLIC PANEL
Anion gap: 8 (ref 5–15)
BUN: 21 mg/dL — ABNORMAL HIGH (ref 6–20)
CO2: 24 mmol/L (ref 22–32)
Calcium: 9 mg/dL (ref 8.9–10.3)
Chloride: 106 mmol/L (ref 98–111)
Creatinine, Ser: 1.32 mg/dL — ABNORMAL HIGH (ref 0.44–1.00)
GFR calc Af Amer: 51 mL/min — ABNORMAL LOW (ref >60)
GFR calc non Af Amer: 44 mL/min — ABNORMAL LOW (ref >60)
Glucose, Bld: 120 mg/dL — ABNORMAL HIGH (ref 70–99)
Potassium: 4 mmol/L (ref 3.5–5.1)
Sodium: 138 mmol/L (ref 135–145)

## 2020-05-22 LAB — CBC
HCT: 33.2 % — ABNORMAL LOW (ref 36.0–46.0)
Hemoglobin: 10.4 g/dL — ABNORMAL LOW (ref 12.0–15.0)
MCH: 29.4 pg (ref 26.0–34.0)
MCHC: 31.3 g/dL (ref 30.0–36.0)
MCV: 93.8 fL (ref 80.0–100.0)
Platelets: 157 10*3/uL (ref 150–400)
RBC: 3.54 MIL/uL — ABNORMAL LOW (ref 3.87–5.11)
RDW: 16 % — ABNORMAL HIGH (ref 11.5–15.5)
WBC: 4.8 10*3/uL (ref 4.0–10.5)
nRBC: 0 % (ref 0.0–0.2)

## 2020-05-22 MED ORDER — DAPAGLIFLOZIN PROPANEDIOL 10 MG PO TABS: 10.0000 mg | ORAL_TABLET | Freq: Every day | ORAL | 5 refills | Status: DC

## 2020-05-22 MED ORDER — POTASSIUM CHLORIDE CRYS ER 10 MEQ PO TBCR: 10.0000 meq | EXTENDED_RELEASE_TABLET | Freq: Every day | ORAL | 5 refills | Status: DC

## 2020-05-22 MED ORDER — FUROSEMIDE 40 MG PO TABS: 20.0000 mg | ORAL_TABLET | Freq: Every day | ORAL | 6 refills | Status: DC

## 2020-05-22 NOTE — Patient Instructions (Addendum)
DECREASE Lasix to 20 mg, one half tab daily DECREASE Potassium to 10 meQ one tab daily START Farxiga 10 mg, one tab daily    Labs today We will only contact you if something comes back abnormal or we need to make some changes. Otherwise no news is good news!  Labs needed in 10-14 days  Your physician recommends that you schedule a follow-up appointment in: 4 months with Dr [name] and echo  Your physician has requested that you have an echocardiogram. Echocardiography is a painless test that uses sound waves to create images of your heart. It provides your doctor with information about the size and shape of your heart and how well your heart's chambers and valves are working. This procedure takes approximately one hour. There are no restrictions for this procedure.  Do the following things EVERYDAY: 1) Weigh yourself in the morning before breakfast. Write it down and keep it in a log. 2) Take your medicines as prescribed 3) Eat low salt foods--Limit salt (sodium) to 2000 mg per day.  4) Stay as active as you can everyday 5) Limit all fluids for the day to less than 2 liters  If you have any questions or concerns before your next appointment please send [place] a message through [facility] or call our office at [phone].    TO LEAVE A MESSAGE FOR THE NURSE SELECT OPTION 2, PLEASE LEAVE A MESSAGE INCLUDING: . YOUR NAME . DATE OF BIRTH . CALL BACK NUMBER . REASON FOR CALL**this is important as we prioritize the call backs  YOU WILL RECEIVE A CALL BACK THE SAME DAY AS LONG AS YOU CALL BEFORE 4:00 PM

## 2020-05-22 NOTE — Progress Notes (Signed)
Patient ID: Anita Warren, female   DOB: 1961-08-25, 59 y.o.   MRN: [id number]     Advanced Heart Failure Clinic Note   Primary Physician: Glo Herring., MD  HF Cardiology: Dr. [name] EP: Dr Allred  Urology: Dr Risa Grill  HPI: Anita Warren is a 59 y.o. female with history of viral, non-ischemic cardiomyopathy, severe systolic dysfunction with EF of 10% s/p ICD pacemaker in the setting of VT 02/2011 (St. Jude);  cath 2012 with no CAD.   Admitted 7/15 with acute on chronic systolic CHF she was not taking medications for 4 months because she lost her insurance. She was diuresed with IV lasix and transitioned to lasix 40 mg daily. She was also discharged on 12.5 mg carvedilol twice a day, hydralazine 25 mg tid, and 30 mg Imdur daily. Discharge weight was 176 pounds. She was not on an ACEI due to angioedema.  Admitted from HF clinic 06/02/14 with low output heart failure. Swan placed and showed cardiogenic shock. Ultimately discharged on  Milrinone 0.25 mcg/kg/min. Hospital stay was complicated by atrial flutter. Loaded on amiodarone and started on eliquis 5 mg twice a day. Met with Dr Prescott Gum and VAD coordinator. She was titrated off the milrinone.  Echo in 8/17 showed EF up to 50%, echo in 8/18 showed EF 45%.  Echo in 11/19 showed EF 35-40%.   She presents for followup of CHF.  She has been doing well symptomatically.  No significant exertional dyspnea.  She can walk up a flight of stairs without problems.  No chest pain.  No lightheadedness.  Weight is down 4 lbs. No orthopnea/PND.    ECG (personally reviewed): NSR, LVH, poor RWP  05/23/14 ECHO EF 5-10% RV mod to severely dilated.  08/09/14 ECHO EF 20-25% RV read as normal 8/17 ECHO EF 50%, mild LV dilation, normal RV size and systolic function.  8/18 ECHO EF 45%, diffuse HK worse inferiorly, normal RV size and systolic function.  11/19 ECHO EF 35-40%, mild LV dilation with diffuse hypokinesis, normal RV size with mildly  decreased systolic function.   Labs:  HIV negative 2015  9/15: K 3.6 => 4.6, creatinine 0.99 => 1.5 => 1.2, Mg 1.9, HCT 45.2, LFTs normal, TSH normal 9/15: PFTs normal 07/31/14: Co-ox off milrinone 66%  K 4.5 Cr 1.36 08/23/14: k 4.4, cr 1.51 digoxin 0.7 09/20/14: K 4.9, creatinine 1.64  12/15: K 4.4, creatinine 1.36 2/16: K 4.2, creatinine 1.67, HCT 33.9 04/28/2016: K 4.1 Creatinine 2.67  8/17: TSH normal, LFTs normal 9/17: K 3.5, creatinine 1.67, hgb 9.9 5/18: hgb 10.7, K 3.8, creatinine 1.45 9/18: K 4.4, creatinine 1.53 4/19: K 3.8, creatinine 1.32, hgb 10.5 6/21: K 4.3, creatinine 1.34, LFTs normal  2013 Colonoscopy Blood Type:  B+  Lower Extremity Doppler - Normal arterial flow Carotid Doppler-  1-39% stenosis involving the right internal carotid artery and the left internal carotid Artery.  SH: Disabled  Lives with husband and son. Has 3 grown children. Does not smoke or drink alcohol. 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    FH: Mom died at 40 CAD         Sister and Brother HTN   Review of systems complete and found to be negative unless listed in HPI.    Past Medical History:  Diagnosis Date  . AICD (automatic cardioverter/defibrillator) present    St. Jude AICD implanted 03-07-2011 Dr. Allred/-Dr. [name] now follows  . CHF (congestive heart failure) (HCC)    meds controlling, no episodes since  2014  . Chronic systolic heart failure ([place])   . Essential hypertension, benign   . Headache   . History of kidney stones    multiple kidney stones in past  . History of medication noncompliance   . Hydronephrosis with renal and ureteral calculus obstruction 09/05/2013  . Nonischemic cardiomyopathy (HCC)    LVEF 5-10%, likely viral (no CAD by cath 01/30/11)  . NSVT (nonsustained ventricular tachycardia) ([place])   . Obesity     Current Outpatient Medications  Medication Sig Dispense Refill  . acetaminophen (TYLENOL) 500 MG tablet Take 1,000 mg by mouth every 6 (six) hours as needed  for headache.     Marland Kitchen apixaban (ELIQUIS) 5 MG TABS tablet Take 1 tablet (5 mg total) by mouth 2 (two) times daily. Must be seen in office for further refills 60 tablet 0  . carvedilol (COREG) 25 MG tablet Take 1 tablet (25 mg total) by mouth 2 (two) times daily. Please call office for appointment [phone] 60 tablet 0  . Coenzyme Q10 (CO Q 10) 100 MG CAPS Take 100 mg by mouth daily.     . diphenhydrAMINE (BENADRYL) 25 MG tablet Take 25 mg by mouth every 6 (six) hours as needed (headache).    . furosemide (LASIX) 40 MG tablet Take 0.5 tablets (20 mg total) by mouth daily. 30 tablet 6  . hydrALAZINE (APRESOLINE) 100 MG tablet Take 1 tablet (100 mg total) by mouth 3 (three) times daily. Please call office for appointment [phone] 90 tablet 0  . isosorbide mononitrate (IMDUR) 30 MG 24 hr tablet Take 3 tablets (90 mg total) by mouth daily. Please call office for appointment [phone] 90 tablet 0  . potassium chloride SA (KLOR-CON) 10 MEQ tablet Take 1 tablet (10 mEq total) by mouth daily. 90 tablet 5  . spironolactone (ALDACTONE) 25 MG tablet Take 1 tablet by mouth once daily 90 tablet 1  . dapagliflozin propanediol (FARXIGA) 10 MG TABS tablet Take 1 tablet (10 mg total) by mouth daily before breakfast. 30 tablet 5   No current facility-administered medications for this encounter.   PHYSICAL EXAM: Vitals:   05/22/20 1013  BP: 126/84  Pulse: 66  SpO2: 98%  Weight: 83.2 kg (183 lb 6.4 oz)    Wt Readings from Last 3 Encounters:  05/22/20 83.2 kg (183 lb 6.4 oz)  05/04/20 83.7 kg (184 lb 8 oz)  04/18/20 83.5 kg (184 lb)    General: NAD Neck: No JVD, no thyromegaly or thyroid nodule.  Lungs: Clear to auscultation bilaterally with normal respiratory effort. CV: Nondisplaced PMI.  Heart regular S1/S2, no S3/S4, no murmur.  No peripheral edema.  No carotid bruit.  Normal pedal pulses.  Abdomen: Soft, nontender, no hepatosplenomegaly, no distention.  Skin: Intact without lesions or rashes.    Neurologic: Alert and oriented x 3.  Psych: Normal affect. Extremities: No clubbing or cyanosis.  HEENT: Normal.   ASSESSMENT & PLAN: 1. Chronic Systolic Heart Failure: Nonischemic cardiomyopathy thought to potentially be due to viral myocarditis noted initially in 2011. Cath 2012 normal coronaries.  St Jude ICD. Echo (7/15) with EF 5-10% and RV mod-severely dilated with moderately decreased systolic function. Repeat echo 10/15 with EF up to 20-25%. She was on milrinone briefly then stopped.  Echo in 8/17 with EF up to 50%. Repeat echo 11/19 showed EF back down to 35-40% with normal RV. NYHA class II symptoms. Not volume overloaded on exam.  - Decrease Lasix to 20 mg daily and start Farxiga 10  mg daily. Can decrease KCl to 10 daily.  BMET today and 10 days.  - Continue spironolactone 25 mg daily.   - No ACEI/ARB/ARNI due to recurrent episodes of angioedema with ACEI and also ARB.  - Continue Coreg 25 mg bid.   - Continue hydralazine 100 mg tid and Imdur 90 mg daily.   - Repeat echo at followup appt.  2. Atrial flutter: Paroxysmal, NSR today.  - She is now off amiodarone.  If atrial flutter recurs, should have ablation.  - Continue Eliquis 5 mg BID. CBC today.  3. Angioedema: Had in past on ACEI, had another episode after ACEI stopped.  She is no longer on ACEI, ARNI, or ARB. ?Hereditary or acquired C1 inhibitor deficiency. No further problems with this recently. 4. CKD: Stage 3 - BMET today.    Loralie Champagne, MD  05/22/2020

## 2020-05-22 NOTE — Progress Notes (Signed)
EPIC Encounter for ICM Monitoring  Patient Name: Anita Warren is a 59 y.o. female Date: 05/22/2020 Primary Care Physican: Redmond School, MD Primary Cardiologist: [name]  Electrophysiologist: Allred 6/23/2021Weight:185lbs (baseline 182 lbs)                                                           Patient is being seen by Dr [name] in HF office today, 05/22/2020.   Coruve thoracic impedance suggesting possible fluid accumulation since 05/01/2020.   Prescribed:  Furosemide 40 mg take 1 tabletdaily.   Potassium 20 mEq take 1tablet daily.  Spironolactone 25 mg take 1 tablet daily   Labs: BMET Drawn 05/22/2020 10/17/2019 Creatinine 1.48, BUN 25, Potassium 3.7, Sodium 138, GFR 39-45  Recommendations:  Any recommendations will be given at HF clinic office visit today.   Follow-up plan: ICM clinic phone appointment on 05/30/2020 to recheck fluid levels.   91 day device clinic remote transmission 06/25/2020.     EP/Cardiology Office Visits: 06/29/2020 with Dr. Rayann Heman.    Copy of ICM check sent to Dr. Rayann Heman and in office visit /27/2021 with Dr [name].   3 month ICM trend: 05/22/2020    1 Year ICM trend:       Rosalene Billings, RN 05/22/2020 10:41 AM

## 2020-05-23 ENCOUNTER — Other Ambulatory Visit (HOSPITAL_COMMUNITY): Payer: Self-pay | Admitting: Cardiology

## 2020-05-30 ENCOUNTER — Ambulatory Visit (INDEPENDENT_AMBULATORY_CARE_PROVIDER_SITE_OTHER): Payer: Medicare Other

## 2020-05-30 DIAGNOSIS — Z95810 Presence of automatic (implantable) cardiac defibrillator: Secondary | ICD-10-CM

## 2020-05-30 DIAGNOSIS — I5022 Chronic systolic (congestive) heart failure: Secondary | ICD-10-CM

## 2020-06-01 NOTE — Progress Notes (Signed)
EPIC Encounter for ICM Monitoring  Patient Name: Anita Warren is a 59 y.o. female Date: 06/01/2020 Primary Care Physican: Redmond School, MD Primary Cardiologist: [name]  Electrophysiologist: Allred 7/27/2021Office Weight:183lbs(baseline 182 lbs)   Transmission reviewed.  Coruve thoracic impedance returned back to baseline normal.  Prescribed:  Furosemide 40 mgtake0.5 tablet (20 mg total) daily.   Potassium 10 mEqtake1tablet daily.  Spironolactone 25 mg take 1 tablet daily  Labs: 05/22/2020 Creatinine 1.32, BUN 21, Potassium 4.0, Sodium 138, GFR 44-51 A complete set of results can be found in Results Review.  Recommendations: No changes  Follow-up plan: ICM clinic phone appointment on8/31/2021. 91 day device clinic remote transmission 06/25/2020.   EP/Cardiology Office Visits: 06/29/2020 with Dr. Rayann Heman.    Copy of ICM check sent to Dr. Rayann Heman    3 month ICM trend: 05/30/2020    1 Year ICM trend:       Rosalene Billings, RN 06/01/2020 5:13 PM

## 2020-06-05 ENCOUNTER — Other Ambulatory Visit: Payer: Self-pay

## 2020-06-05 ENCOUNTER — Ambulatory Visit (HOSPITAL_COMMUNITY)
Admission: RE | Admit: 2020-06-05 | Discharge: 2020-06-05 | Disposition: A | Discharge status: Home / Self Care | Payer: Medicare Other | Source: Ambulatory Visit | Attending: Cardiology | Admitting: Cardiology

## 2020-06-05 DIAGNOSIS — I5022 Chronic systolic (congestive) heart failure: Secondary | ICD-10-CM | POA: Diagnosis not present

## 2020-06-05 LAB — BASIC METABOLIC PANEL
Anion gap: 7 (ref 5–15)
BUN: 24 mg/dL — ABNORMAL HIGH (ref 6–20)
CO2: 24 mmol/L (ref 22–32)
Calcium: 9.1 mg/dL (ref 8.9–10.3)
Chloride: 107 mmol/L (ref 98–111)
Creatinine, Ser: 1.38 mg/dL — ABNORMAL HIGH (ref 0.44–1.00)
GFR calc Af Amer: 49 mL/min — ABNORMAL LOW (ref >60)
GFR calc non Af Amer: 42 mL/min — ABNORMAL LOW (ref >60)
Glucose, Bld: 110 mg/dL — ABNORMAL HIGH (ref 70–99)
Potassium: 3.9 mmol/L (ref 3.5–5.1)
Sodium: 138 mmol/L (ref 135–145)

## 2020-06-11 ENCOUNTER — Ambulatory Visit: Payer: Medicare Other | Admitting: Gastroenterology

## 2020-06-18 ENCOUNTER — Telehealth (HOSPITAL_COMMUNITY): Payer: Self-pay | Admitting: Pharmacy Technician

## 2020-06-18 NOTE — Telephone Encounter (Signed)
Received a notification that insurance requires a PA for [place]. Upon investigating, the pharmacy was billing the free card and not the patient's insurance.   30 day co-pay with through insurance, $9.20.  Charlann Boxer, CPhT

## 2020-06-19 ENCOUNTER — Other Ambulatory Visit (HOSPITAL_COMMUNITY): Payer: Self-pay | Admitting: Cardiology

## 2020-06-24 ENCOUNTER — Other Ambulatory Visit (HOSPITAL_COMMUNITY): Payer: Self-pay | Admitting: Cardiology

## 2020-06-25 ENCOUNTER — Ambulatory Visit (INDEPENDENT_AMBULATORY_CARE_PROVIDER_SITE_OTHER): Payer: Medicare Other | Admitting: *Deleted

## 2020-06-25 DIAGNOSIS — I428 Other cardiomyopathies: Secondary | ICD-10-CM

## 2020-06-25 DIAGNOSIS — I429 Cardiomyopathy, unspecified: Secondary | ICD-10-CM

## 2020-06-26 ENCOUNTER — Ambulatory Visit: Payer: Medicare Other | Admitting: Gastroenterology

## 2020-06-26 ENCOUNTER — Ambulatory Visit (INDEPENDENT_AMBULATORY_CARE_PROVIDER_SITE_OTHER): Payer: Medicare Other

## 2020-06-26 DIAGNOSIS — I428 Other cardiomyopathies: Secondary | ICD-10-CM | POA: Diagnosis not present

## 2020-06-26 DIAGNOSIS — Z95810 Presence of automatic (implantable) cardiac defibrillator: Secondary | ICD-10-CM

## 2020-06-26 DIAGNOSIS — I5022 Chronic systolic (congestive) heart failure: Secondary | ICD-10-CM | POA: Diagnosis not present

## 2020-06-26 DIAGNOSIS — I130 Hypertensive heart and chronic kidney disease with heart failure and stage 1 through stage 4 chronic kidney disease, or unspecified chronic kidney disease: Secondary | ICD-10-CM | POA: Diagnosis not present

## 2020-06-26 DIAGNOSIS — N1831 Chronic kidney disease, stage 3a: Secondary | ICD-10-CM | POA: Diagnosis not present

## 2020-06-26 LAB — CUP PACEART REMOTE DEVICE CHECK
Battery Remaining Longevity: 31 mo
Battery Remaining Percentage: 27 %
Battery Voltage: 2.83 V
Brady Statistic RV Percent Paced: 1 %
Date Time Interrogation Session: 20210830022359
HighPow Impedance: 39 Ohm
Implantable Lead Implant Date: 20120511
Implantable Lead Location: 753860
Implantable Lead Model: 7120
Implantable Pulse Generator Implant Date: 20120511
Lead Channel Impedance Value: 490 Ohm
Lead Channel Pacing Threshold Amplitude: 1 V
Lead Channel Pacing Threshold Pulse Width: 0.5 ms
Lead Channel Sensing Intrinsic Amplitude: 11.7 mV
Lead Channel Setting Pacing Amplitude: 2.5 V
Lead Channel Setting Pacing Pulse Width: 0.5 ms
Lead Channel Setting Sensing Sensitivity: 0.5 mV
Pulse Gen Serial Number: 634653

## 2020-06-27 NOTE — Progress Notes (Signed)
Remote ICD transmission.   

## 2020-06-29 ENCOUNTER — Encounter: Payer: Medicare Other | Admitting: Internal Medicine

## 2020-06-29 NOTE — Progress Notes (Signed)
EPIC Encounter for ICM Monitoring  Patient Name: Anita Warren is a 59 y.o. female Date: 06/29/2020 Primary Care Physican: Redmond School, MD Primary Cardiologist: [name]  Electrophysiologist: Allred 7/27/2021Office Weight:183lbs(baseline 182 lbs)   Spoke with patient.  She reports she is on the way to [facility] for her [employer]. Expressed my sympathy.  Coruve thoracic impedance normal (per updated direct trend viewer through 9/2).  Prescribed:  Furosemide 40 mgtake0.5 tablet (20 mg total) daily.   Potassium 10 mEqtake1tablet daily.  Spironolactone 25 mg take 1 tablet daily  Labs: 05/22/2020 Creatinine 1.32, BUN 21, Potassium 4.0, Sodium 138, GFR 44-51 A complete set of results can be found in Results Review.  Recommendations:No changes and encouraged to call if experiencing any fluid symptoms.  Follow-up plan: ICM clinic phone appointment on10/01/2020. 91 day device clinic remote transmission 09/24/2020.  EP/Cardiology Office Visits:07/27/2020 with Dr.Allred.   Copy of ICM check sent to Dr.Allred    Direct Trend 06/28/2020     3 month ICM trend: 06/26/2020    1 Year ICM trend:       Rosalene Billings, RN 06/29/2020 8:46 AM

## 2020-07-02 ENCOUNTER — Other Ambulatory Visit: Payer: Self-pay | Admitting: Cardiology

## 2020-07-11 ENCOUNTER — Other Ambulatory Visit (HOSPITAL_COMMUNITY): Payer: Self-pay | Admitting: Cardiology

## 2020-07-18 ENCOUNTER — Other Ambulatory Visit: Payer: Self-pay | Admitting: Cardiology

## 2020-07-18 ENCOUNTER — Ambulatory Visit: Payer: Medicare Other | Admitting: Vascular Surgery

## 2020-07-18 ENCOUNTER — Other Ambulatory Visit: Payer: Self-pay

## 2020-07-18 ENCOUNTER — Ambulatory Visit (HOSPITAL_COMMUNITY)
Admission: RE | Admit: 2020-07-18 | Discharge: 2020-07-18 | Disposition: A | Discharge status: Home / Self Care | Payer: Medicare Other | Source: Ambulatory Visit | Attending: Vascular Surgery | Admitting: Vascular Surgery

## 2020-07-18 ENCOUNTER — Encounter: Payer: Self-pay | Admitting: Vascular Surgery

## 2020-07-18 VITALS — BP 113/74 | HR 71 | Temp 97.9°F | Resp 14 | Ht 63.5 in | Wt 180.0 lb

## 2020-07-18 DIAGNOSIS — I83813 Varicose veins of bilateral lower extremities with pain: Secondary | ICD-10-CM

## 2020-07-18 DIAGNOSIS — I83811 Varicose veins of right lower extremities with pain: Secondary | ICD-10-CM | POA: Diagnosis not present

## 2020-07-18 NOTE — Progress Notes (Signed)
Patient is a 59 year old female who returns for follow-up today.  She was previously seen April 18, 2020 for varicose veins in her right lower extremity.  She complained of pain aching over the areas of varicosities.  She has been wearing compression stockings for the last 3 months.  She notes some but certainly not completely relief from the aching and pain of her varicosities.  She is on Eliquis for cardiomyopathy.  Most recent EF was 35 to 40%.  She has not had any congestive heart failure hospital admissions recently.  Review of systems: She has no shortness of breath.  She has no chest pain.  Physical exam:  Vitals:   07/18/20 1347  BP: 113/74  Pulse: 71  Resp: 14  Temp: 97.9 F (36.6 C)  TempSrc: Temporal  SpO2: 98%  Weight: 180 lb (81.6 kg)  Height: 5' 3.5" (1.613 m)    Extremities: Right lower extremity diffuse varicosities along the medial aspect of the right thigh and calf.  Data: Patient had a duplex ultrasound today which showed diffuse reflux of the left and right greater saphenous vein extending all the way through the saphenofemoral junction.  Right side was 6 to 10 mm.  Left side was 4-1/2 to 5 mm.  Patient is currently asymptomatic on the left side.  I also did a SonoSite exam at the bedside to look for tortuosity and continuity of the vein.  And she seemed to be a good laser candidate the bulk of the saphenous vein is fairly superficial.  There is no significant tortuosity.  There are several large branches.  Assessment: Symptomatic varicose veins with pain and diffuse reflux greater saphenous vein.  Plan: Laser ablation right greater saphenous vein with greater than 20 stab avulsions to be scheduled in the near future pending insurance approval.  We had already discussed previously with Dr. Algernon Huxley and he thought it would be okay to stop her Eliquis for 2 days prior to the procedure and then resume post procedure.  Risk benefits possible complications of procedure  details including not limited to bleeding infection nerve injury DVT were discussed the patient today.  She understands and agrees to proceed.  Ruta Hinds, MD [facility] Office: [phone]

## 2020-07-20 ENCOUNTER — Encounter: Payer: Self-pay | Admitting: Vascular Surgery

## 2020-07-24 ENCOUNTER — Other Ambulatory Visit: Payer: Self-pay | Admitting: *Deleted

## 2020-07-24 DIAGNOSIS — I83811 Varicose veins of right lower extremities with pain: Secondary | ICD-10-CM

## 2020-07-25 ENCOUNTER — Other Ambulatory Visit: Payer: Self-pay

## 2020-07-25 DIAGNOSIS — I83813 Varicose veins of bilateral lower extremities with pain: Secondary | ICD-10-CM

## 2020-07-27 ENCOUNTER — Encounter: Payer: Self-pay | Admitting: Internal Medicine

## 2020-07-27 ENCOUNTER — Ambulatory Visit (INDEPENDENT_AMBULATORY_CARE_PROVIDER_SITE_OTHER): Payer: Medicare Other | Admitting: Internal Medicine

## 2020-07-27 VITALS — BP 114/64 | HR 76 | Ht 63.5 in | Wt 182.0 lb

## 2020-07-27 DIAGNOSIS — I10 Essential (primary) hypertension: Secondary | ICD-10-CM

## 2020-07-27 DIAGNOSIS — I5022 Chronic systolic (congestive) heart failure: Secondary | ICD-10-CM | POA: Diagnosis not present

## 2020-07-27 DIAGNOSIS — I428 Other cardiomyopathies: Secondary | ICD-10-CM

## 2020-07-27 DIAGNOSIS — D6869 Other thrombophilia: Secondary | ICD-10-CM

## 2020-07-27 LAB — CUP PACEART INCLINIC DEVICE CHECK
Battery Remaining Longevity: 30 mo
Brady Statistic RV Percent Paced: 0.01 %
Date Time Interrogation Session: 20211001085839
HighPow Impedance: 39.9661
Implantable Lead Implant Date: 20120511
Implantable Lead Location: 753860
Implantable Lead Model: 7120
Implantable Pulse Generator Implant Date: 20120511
Lead Channel Impedance Value: 550 Ohm
Lead Channel Pacing Threshold Amplitude: 0.75 V
Lead Channel Pacing Threshold Amplitude: 0.75 V
Lead Channel Pacing Threshold Pulse Width: 0.5 ms
Lead Channel Pacing Threshold Pulse Width: 0.5 ms
Lead Channel Sensing Intrinsic Amplitude: 11.7 mV
Lead Channel Setting Pacing Amplitude: 2.5 V
Lead Channel Setting Pacing Pulse Width: 0.5 ms
Lead Channel Setting Sensing Sensitivity: 0.5 mV
Pulse Gen Serial Number: 634653

## 2020-07-27 NOTE — Patient Instructions (Signed)
Medication Instructions:  Continue all current medications.  Labwork: none  Testing/Procedures: none  Follow-Up: 1 year   Any Other Special Instructions Will Be Listed Below (If Applicable).  If you need a refill on your cardiac medications before your next appointment, please call your pharmacy.  

## 2020-07-27 NOTE — Progress Notes (Signed)
PCP: Redmond School, MD Primary Cardiologist: Dr [name] Primary EP: Dr Rayann Heman  Anita Warren is a 59 y.o. female who presents today for routine electrophysiology followup.  Since last being seen in our clinic, the patient reports doing very well.  Today, she denies symptoms of palpitations, chest pain, shortness of breath,  lower extremity edema, dizziness, presyncope, syncope, or ICD shocks.  The patient is otherwise without complaint today.   Past Medical History:  Diagnosis Date  . AICD (automatic cardioverter/defibrillator) present    St. Jude AICD implanted 03-07-2011 Dr. Jannelly Bergren/-Dr. [name] now follows  . CHF (congestive heart failure) ([place])    meds controlling, no episodes since 2014  . Chronic systolic heart failure ([place])   . Essential hypertension, benign   . Headache   . History of kidney stones    multiple kidney stones in past  . History of medication noncompliance   . Hydronephrosis with renal and ureteral calculus obstruction 09/05/2013  . Nonischemic cardiomyopathy (HCC)    LVEF 5-10%, likely viral (no CAD by cath 01/30/11)  . NSVT (nonsustained ventricular tachycardia) ([place])   . Obesity    Past Surgical History:  Procedure Laterality Date  . ABDOMINAL HYSTERECTOMY    . BREAST BIOPSY Left 10/19/2019   Procedure: BREAST BIOPSY WITH NEEDLE LOCALIZATION;  Surgeon: Aviva Signs, MD;  Location: [facility];  Service: General;  Laterality: Left;  . BREAST LUMPECTOMY  1989   L breast- benign  . CARDIAC CATHETERIZATION    . CARDIAC DEFIBRILLATOR PLACEMENT  5/12   SJM by JA  . CESAREAN SECTION     x 2  . CHOLECYSTECTOMY    . COLONOSCOPY  07/02/2012   Procedure: COLONOSCOPY;  Surgeon: [name], MD;  Location: [facility];  Service: Endoscopy;  Laterality: N/A;  1:15/PATIENT HAS A DEFIBRILLATOR  . CYSTOSCOPY W/ URETERAL STENT PLACEMENT Right 09/06/2013   Procedure: CYSTOSCOPY WITH RIGHT RETROGRADE PYELOGRAM; RIGHT URETERAL STENT PLACEMENT;  Surgeon: [name], MD;  Location: [facility];  Service: Urology;  Laterality: Right;  . CYSTOSCOPY W/ URETERAL STENT PLACEMENT N/A 04/28/2016   Procedure: CYSTOSCOPY WITH  RIGHT RETROGRADE PYELOGRAM/RIGHT URETERAL STENT PLACEMENT;  Surgeon: Rana Snare, MD;  Location: [facility];  Service: Urology;  Laterality: N/A;  . CYSTOSCOPY W/ URETERAL STENT REMOVAL Right 07/21/2016   Procedure: CYSTOSCOPY WITH STENT REMOVAL;  Surgeon: Franchot Gallo, MD;  Location: [facility];  Service: Urology;  Laterality: Right;  . CYSTOSCOPY/URETEROSCOPY/HOLMIUM LASER/STENT PLACEMENT Right 07/21/2016   Procedure: CYSTOSCOPY/URETEROSCOPY/HOLMIUM LASER/   right retrograde pylegram;  Surgeon: Franchot Gallo, MD;  Location: [facility];  Service: Urology;  Laterality: Right;  . TONSILLECTOMY    . TUBAL LIGATION      ROS- all systems are reviewed and negative except as per HPI above  Current Outpatient Medications  Medication Sig Dispense Refill  . acetaminophen (TYLENOL) 500 MG tablet Take 1,000 mg by mouth every 6 (six) hours as needed for headache.     Marland Kitchen apixaban (ELIQUIS) 5 MG TABS tablet Take 1 tablet (5 mg total) by mouth 2 (two) times daily. 180 tablet 3  . carvedilol (COREG) 25 MG tablet Take 1 tablet (25 mg total) by mouth 2 (two) times daily with a meal. 180 tablet 3  . Coenzyme Q10 (CO Q 10) 100 MG CAPS Take 100 mg by mouth daily.     . dapagliflozin propanediol (FARXIGA) 10 MG TABS tablet Take 1 tablet (10 mg total) by mouth daily before breakfast. 30 tablet 5  .  diphenhydrAMINE (BENADRYL) 25 MG tablet Take 25 mg by mouth every 6 (six) hours as needed (headache).    . furosemide (LASIX) 20 MG tablet Take 1 tablet (20 mg total) by mouth daily. 90 tablet 3  . hydrALAZINE (APRESOLINE) 100 MG tablet Take 1 tablet (100 mg total) by mouth 3 (three) times daily. 90 tablet 11  . isosorbide mononitrate (IMDUR) 30 MG 24 hr tablet Take 3 tablets by mouth once daily 90 tablet 11  . potassium chloride SA (KLOR-CON) 10 MEQ tablet Take 1 tablet  (10 mEq total) by mouth daily. 90 tablet 5  . spironolactone (ALDACTONE) 25 MG tablet Take 1 tablet by mouth once daily 90 tablet 0   No current facility-administered medications for this visit.    Physical Exam: Vitals:   07/27/20 0846  Height: 5' 3.5" (1.613 m)    GEN- The patient is well appearing, alert and oriented x 3 today.   Head- normocephalic, atraumatic Eyes-  Sclera clear, conjunctiva pink Ears- hearing intact Oropharynx- clear Lungs- C normal work of breathing Chest- ICD pocket is well healed Heart- Regular rate and rhythm  GI- soft  Extremities- no clubbing, cyanosis, or edema  ICD interrogation- reviewed in detail today,  See PACEART report    Wt Readings from Last 3 Encounters:  07/18/20 180 lb (81.6 kg)  05/22/20 183 lb 6.4 oz (83.2 kg)  05/04/20 184 lb 8 oz (83.7 kg)    Assessment and Plan:  1.  Chronic systolic dysfunction/ nonischemic CM euvolemic today Stable on an appropriate medical regimen Normal ICD function See [facility] report No changes today she is not device dependant today followed in ICM device clinic Echo ordered by Dr [name] to be completed before his next visit. qrs is narrow.  Could consider Baroreceptor Activation Therapy if EF <35%  2. Remote atypical atrial flutter No recurrence in the past year, off AAD therapy chads2vasc sore is at least 3 She is on eliquis  3.HTN Stable No change required today  4. CRI, stage III Stable No change required today  Remotes are up to date Return in a year  Risks, benefits and potential toxicities for medications prescribed and/or refilled reviewed with patient today.   Thompson Grayer MD, [facility] 07/27/2020 8:49 AM

## 2020-07-30 ENCOUNTER — Ambulatory Visit (INDEPENDENT_AMBULATORY_CARE_PROVIDER_SITE_OTHER): Payer: Medicare Other

## 2020-07-30 DIAGNOSIS — I5022 Chronic systolic (congestive) heart failure: Secondary | ICD-10-CM

## 2020-07-30 DIAGNOSIS — Z95810 Presence of automatic (implantable) cardiac defibrillator: Secondary | ICD-10-CM

## 2020-07-31 NOTE — Progress Notes (Signed)
EPIC Encounter for ICM Monitoring  Patient Name: Anita Warren is a 59 y.o. female Date: 07/31/2020 Primary Care Physican: Redmond School, MD Primary Cardiologist: [name]  Electrophysiologist: Allred 7/27/2021OfficeWeight:183lbs(baseline 182 lbs)   Spoke with patient and reports feeling well at this time.  Denies fluid symptoms.  She said she was instructed to drink extra fluids the last couple of days because of vein procedure tomorrow.  She will be back on track once the procedure is done.  She was in [place] from 9/7-9/17 for fiance's funeral which contributed to fluid accumulation.  Coruve thoracic impedance suggesting the start of possible fluid accumulation on 10/3.  Impedance suggesting possible fluid accumulation from 9/7-9/19 followed by possible dryness 9/19-10/2.  Prescribed:  Furosemide 20 mgtake1tablet (20 mg total)daily.   Potassium10 mEqtake1tablet daily.  Spironolactone 25 mg take 1 tablet daily  Labs: 06/05/2020 Creatinine 1.38, BUN 24, Potassium 3.9, Sodium 138, GFR 42-49 05/22/2020 Creatinine1.32, BUN21, Potassium4.0, Sodium138, XKG81-85 A complete set of results can be found in Results Review.  Recommendations: No changes today.   Follow-up plan: ICM clinic phone appointment on10/08/2020 (manual) to recheck fluid levels. 91 day device clinic remote transmission 09/24/2020.  EP/Cardiology Office Visits:09/25/2020 with Dr [name].   Copy of ICM check sent to Dr.Allred.  3 month ICM trend: 07/30/2020    1 Year ICM trend:       Rosalene Billings, RN 07/31/2020 10:54 AM

## 2020-08-01 ENCOUNTER — Other Ambulatory Visit: Payer: Self-pay

## 2020-08-01 ENCOUNTER — Encounter: Payer: Self-pay | Admitting: Vascular Surgery

## 2020-08-01 ENCOUNTER — Ambulatory Visit: Payer: Medicare Other | Admitting: Vascular Surgery

## 2020-08-01 DIAGNOSIS — I8393 Asymptomatic varicose veins of bilateral lower extremities: Secondary | ICD-10-CM

## 2020-08-01 DIAGNOSIS — I83811 Varicose veins of right lower extremities with pain: Secondary | ICD-10-CM

## 2020-08-01 HISTORY — PX: OTHER SURGICAL HISTORY: SHX169

## 2020-08-01 NOTE — Progress Notes (Signed)
     Laser Ablation Procedure    Date: 08/01/2020   Anita Warren DOB:03/25/1961  Consent signed: Yes      Surgeon: Ruta Hinds  MD  Procedure: Laser Ablation: right Greater Saphenous Vein  BP 107/65 (BP Location: Left Arm, Patient Position: Sitting, Cuff Size: Normal)   Pulse 73   Temp 97.9 F (36.6 C) (Temporal)   Resp 16   Ht 5\' 3"  (1.6 m)   Wt 182 lb (82.6 kg)   LMP 10/28/1999   SpO2 96%   BMI 32.24 kg/m   Tumescent Anesthesia: 500 cc 0.9% NaCl with 50 cc Lidocaine HCL 1%  and 15 cc 8.4% NaHCO3  Local Anesthesia: 25 cc Lidocaine HCL and NaHCO3 (ratio 2:1)  7 watts continuous mode     Total energy: 1001 joules     Total time: 143seconds Treatment Length  16 cm  Laser Fiber Ref. #   [id number]                           Lot #  [id number]   Stab Phlebectomy: > 20 Sites: Thigh and Calf  Patient tolerated procedure well  Notes: Patient wore face mask.  All staff members wore facial masks and facial shields/goggles.  Patient last dose of Eliquis was on 07/29/2020.  Description of Procedure:  After marking the course of the secondary varicosities, the patient was placed on the operating table in the supine position, and the right leg was prepped and draped in sterile fashion.   Local anesthetic was administered and under ultrasound guidance the saphenous vein was accessed with a micro needle and guide wire; then the mirco puncture sheath was placed.  A guide wire was inserted saphenofemoral junction , followed by a 5 french sheath.  The position of the sheath and then the laser fiber below the junction was confirmed using the ultrasound.  Tumescent anesthesia was administered along the course of the saphenous vein using ultrasound guidance. The patient was placed in Trendelenburg position and protective laser glasses were placed on patient and staff, and the laser was fired at 7 watts continuous mode for a total of 1001 joules.   For stab phlebectomies, local  anesthetic was administered at the previously marked varicosities, and tumescent anesthesia was administered around the vessels.  Greater than 20 stab wounds were made using the tip of an 11 blade. And using the vein hook, the phlebectomies were performed using a hemostat to avulse the varicosities.  Adequate hemostasis was achieved.     Steri strips were applied to the stab wounds and ABD pads and thigh high compression stockings were applied.  Ace wrap bandages were applied over the phlebectomy sites and at the top of the saphenofemoral junction. Blood loss was less than 15 cc.  Discharge instructions reviewed with patient and hardcopy of discharge instructions given to patient to take home. The patient ambulated out of the operating room having tolerated the procedure well.  Ruta Hinds, MD [facility] [place] Office: [phone]

## 2020-08-07 NOTE — Progress Notes (Signed)
No ICM remote transmission received for 08/06/2020 and next ICM transmission scheduled for 09/03/2020.

## 2020-08-13 ENCOUNTER — Other Ambulatory Visit: Payer: Self-pay

## 2020-08-13 ENCOUNTER — Other Ambulatory Visit: Payer: Medicare Other

## 2020-08-13 DIAGNOSIS — Z20822 Contact with and (suspected) exposure to covid-19: Secondary | ICD-10-CM

## 2020-08-14 LAB — SARS-COV-2, NAA 2 DAY TAT

## 2020-08-14 LAB — SPECIMEN STATUS REPORT

## 2020-08-14 LAB — NOVEL CORONAVIRUS, NAA: SARS-CoV-2, NAA: NOT DETECTED

## 2020-08-15 ENCOUNTER — Ambulatory Visit (HOSPITAL_COMMUNITY)
Admission: RE | Admit: 2020-08-15 | Discharge: 2020-08-15 | Disposition: A | Discharge status: Home / Self Care | Payer: Medicare Other | Source: Ambulatory Visit | Attending: Vascular Surgery | Admitting: Vascular Surgery

## 2020-08-15 ENCOUNTER — Ambulatory Visit: Payer: Self-pay | Admitting: Vascular Surgery

## 2020-08-15 ENCOUNTER — Other Ambulatory Visit: Payer: Self-pay

## 2020-08-15 ENCOUNTER — Other Ambulatory Visit (HOSPITAL_COMMUNITY): Payer: Self-pay | Admitting: Internal Medicine

## 2020-08-15 ENCOUNTER — Encounter: Payer: Self-pay | Admitting: Vascular Surgery

## 2020-08-15 VITALS — BP 107/68 | Temp 97.7°F | Resp 16 | Ht 63.5 in | Wt 182.0 lb

## 2020-08-15 DIAGNOSIS — I83811 Varicose veins of right lower extremities with pain: Secondary | ICD-10-CM

## 2020-08-15 DIAGNOSIS — I83813 Varicose veins of bilateral lower extremities with pain: Secondary | ICD-10-CM

## 2020-08-15 DIAGNOSIS — Z1231 Encounter for screening mammogram for malignant neoplasm of breast: Secondary | ICD-10-CM

## 2020-08-15 NOTE — Progress Notes (Signed)
Patient is a 59 year old female who returns for postoperative follow-up today.  She underwent laser ablation of her left greater saphenous and stab avulsions left leg on August 01, 2020.  She still has some soreness over several of the stab avulsion sites and some lumps in the skin which are tender to palpation.  She has no shortness of breath or chest pain.  She continues to wear compression stocking.  Physical exam:  Vitals:   08/15/20 1032  BP: 107/68  Resp: 16  Temp: 97.7 F (36.5 C)  TempSrc: Temporal  SpO2: 99%  Weight: 182 lb (82.6 kg)  Height: 5' 3.5" (1.613 m)    Extremities: Several areas of firm thickening under the skin slightly tender to palpation no erythema no discharge all incisions healing well  Data:  Patient had a venous duplex exam of her left leg today.  I reviewed these images.  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  The anterior branch is completely closed from the laser procedure.  There is still a medial segment that is patent.  There is no evidence of DVT.  Assessment: Doing well status post laser ablation of right greater saphenous vein.  She does have an another branch which may need laser ablation at some point in the future but she so far is feeling well after the first procedure and may not need any further interventions.  If she does have recurrent symptoms we could consider laser ablation of the duplicated saphenous system.  The soreness in her legs should continue to resolve with time I discussed with her that the lumps in her leg are related to small areas of hematoma.  She will continue ice and intermittent warm compresses if necessary.  Plan: Patient with improved symptoms post laser ablation right greater saphenous and stab avulsions.  She will follow up on an as-needed basis.  She will continue to wear her compression stocking.  Ruta Hinds, MD [facility] Office: [phone]

## 2020-08-25 DIAGNOSIS — I5022 Chronic systolic (congestive) heart failure: Secondary | ICD-10-CM | POA: Diagnosis not present

## 2020-08-25 DIAGNOSIS — N183 Chronic kidney disease, stage 3 unspecified: Secondary | ICD-10-CM | POA: Diagnosis not present

## 2020-08-25 DIAGNOSIS — I130 Hypertensive heart and chronic kidney disease with heart failure and stage 1 through stage 4 chronic kidney disease, or unspecified chronic kidney disease: Secondary | ICD-10-CM | POA: Diagnosis not present

## 2020-08-25 DIAGNOSIS — I428 Other cardiomyopathies: Secondary | ICD-10-CM | POA: Diagnosis not present

## 2020-09-03 ENCOUNTER — Ambulatory Visit (INDEPENDENT_AMBULATORY_CARE_PROVIDER_SITE_OTHER): Payer: Medicare Other

## 2020-09-03 DIAGNOSIS — Z95810 Presence of automatic (implantable) cardiac defibrillator: Secondary | ICD-10-CM

## 2020-09-03 DIAGNOSIS — I5022 Chronic systolic (congestive) heart failure: Secondary | ICD-10-CM | POA: Diagnosis not present

## 2020-09-07 ENCOUNTER — Encounter (HOSPITAL_COMMUNITY): Payer: Self-pay

## 2020-09-07 ENCOUNTER — Other Ambulatory Visit: Payer: Self-pay

## 2020-09-07 ENCOUNTER — Ambulatory Visit (HOSPITAL_COMMUNITY)
Admission: RE | Admit: 2020-09-07 | Discharge: 2020-09-07 | Disposition: A | Discharge status: Home / Self Care | Payer: Medicare Other | Source: Ambulatory Visit | Attending: Internal Medicine | Admitting: Internal Medicine

## 2020-09-07 DIAGNOSIS — Z1231 Encounter for screening mammogram for malignant neoplasm of breast: Secondary | ICD-10-CM | POA: Diagnosis not present

## 2020-09-07 NOTE — Progress Notes (Signed)
EPIC Encounter for ICM Monitoring  Patient Name: Anita Warren is a 59 y.o. female Date: 09/07/2020 Primary Care Physican: Redmond School, MD Primary Cardiologist: [name]  Electrophysiologist: Allred 10/20/2021OfficeWeight:182lbs(baseline 182 lbs)   Transmission reviewed.  Coruve thoracic impedance normal fluid levels.  Prescribed:  Furosemide 20 mgtake1tablet (20 mg total)daily.   Potassium10 mEqtake1tablet daily.  Spironolactone 25 mg take 1 tablet daily  Labs: 06/05/2020 Creatinine 1.38, BUN 24, Potassium 3.9, Sodium 138, GFR 42-49 05/22/2020 Creatinine1.32, BUN21, Potassium4.0, Sodium138, OVP03-40 A complete set of results can be found in Results Review.  Recommendations: No changes.   Follow-up plan: ICM clinic phone appointment on12/13/2021 91 day device clinic remote transmission11/29/2021.  EP/Cardiology Office Visits:09/25/2020 with Dr [name].   Copy of ICM check sent to Dr.Allred.  3 month ICM trend: 09/03/2020    1 Year ICM trend:       Rosalene Billings, RN 09/07/2020 3:18 PM

## 2020-09-24 ENCOUNTER — Ambulatory Visit (INDEPENDENT_AMBULATORY_CARE_PROVIDER_SITE_OTHER): Payer: Medicare Other

## 2020-09-24 DIAGNOSIS — I428 Other cardiomyopathies: Secondary | ICD-10-CM

## 2020-09-25 ENCOUNTER — Other Ambulatory Visit: Payer: Self-pay

## 2020-09-25 ENCOUNTER — Ambulatory Visit (HOSPITAL_BASED_OUTPATIENT_CLINIC_OR_DEPARTMENT_OTHER)
Admission: RE | Admit: 2020-09-25 | Discharge: 2020-09-25 | Disposition: A | Discharge status: Home / Self Care | Payer: Medicare Other | Source: Ambulatory Visit | Attending: Cardiology | Admitting: Cardiology

## 2020-09-25 ENCOUNTER — Ambulatory Visit (HOSPITAL_COMMUNITY)
Admission: RE | Admit: 2020-09-25 | Discharge: 2020-09-25 | Disposition: A | Discharge status: Home / Self Care | Payer: Medicare Other | Source: Ambulatory Visit | Attending: Cardiology | Admitting: Cardiology

## 2020-09-25 ENCOUNTER — Encounter (HOSPITAL_COMMUNITY): Payer: Self-pay | Admitting: Cardiology

## 2020-09-25 VITALS — BP 140/80 | HR 64 | Wt 187.2 lb

## 2020-09-25 DIAGNOSIS — I428 Other cardiomyopathies: Secondary | ICD-10-CM | POA: Diagnosis not present

## 2020-09-25 DIAGNOSIS — I5022 Chronic systolic (congestive) heart failure: Secondary | ICD-10-CM

## 2020-09-25 DIAGNOSIS — Z8249 Family history of ischemic heart disease and other diseases of the circulatory system: Secondary | ICD-10-CM | POA: Insufficient documentation

## 2020-09-25 DIAGNOSIS — Z950 Presence of cardiac pacemaker: Secondary | ICD-10-CM | POA: Diagnosis not present

## 2020-09-25 DIAGNOSIS — I130 Hypertensive heart and chronic kidney disease with heart failure and stage 1 through stage 4 chronic kidney disease, or unspecified chronic kidney disease: Secondary | ICD-10-CM | POA: Diagnosis not present

## 2020-09-25 DIAGNOSIS — I4891 Unspecified atrial fibrillation: Secondary | ICD-10-CM | POA: Insufficient documentation

## 2020-09-25 DIAGNOSIS — I313 Pericardial effusion (noninflammatory): Secondary | ICD-10-CM | POA: Diagnosis not present

## 2020-09-25 DIAGNOSIS — Z7901 Long term (current) use of anticoagulants: Secondary | ICD-10-CM | POA: Insufficient documentation

## 2020-09-25 DIAGNOSIS — N183 Chronic kidney disease, stage 3 unspecified: Secondary | ICD-10-CM | POA: Insufficient documentation

## 2020-09-25 DIAGNOSIS — Z79899 Other long term (current) drug therapy: Secondary | ICD-10-CM | POA: Diagnosis not present

## 2020-09-25 DIAGNOSIS — I4892 Unspecified atrial flutter: Secondary | ICD-10-CM | POA: Insufficient documentation

## 2020-09-25 LAB — CBC
HCT: 32.8 % — ABNORMAL LOW (ref 36.0–46.0)
Hemoglobin: 10.3 g/dL — ABNORMAL LOW (ref 12.0–15.0)
MCH: 28.6 pg (ref 26.0–34.0)
MCHC: 31.4 g/dL (ref 30.0–36.0)
MCV: 91.1 fL (ref 80.0–100.0)
Platelets: 183 10*3/uL (ref 150–400)
RBC: 3.6 MIL/uL — ABNORMAL LOW (ref 3.87–5.11)
RDW: 16 % — ABNORMAL HIGH (ref 11.5–15.5)
WBC: 5.8 10*3/uL (ref 4.0–10.5)
nRBC: 0 % (ref 0.0–0.2)

## 2020-09-25 LAB — BASIC METABOLIC PANEL
Anion gap: 8 (ref 5–15)
BUN: 22 mg/dL — ABNORMAL HIGH (ref 6–20)
CO2: 24 mmol/L (ref 22–32)
Calcium: 9.1 mg/dL (ref 8.9–10.3)
Chloride: 104 mmol/L (ref 98–111)
Creatinine, Ser: 1.36 mg/dL — ABNORMAL HIGH (ref 0.44–1.00)
GFR, Estimated: 45 mL/min — ABNORMAL LOW (ref >60)
Glucose, Bld: 119 mg/dL — ABNORMAL HIGH (ref 70–99)
Potassium: 3.8 mmol/L (ref 3.5–5.1)
Sodium: 136 mmol/L (ref 135–145)

## 2020-09-25 LAB — ECHOCARDIOGRAM COMPLETE
Area-P 1/2: 3.48 cm2
Calc EF: 43.8 %
S' Lateral: 4.6 cm
Single Plane A2C EF: 53.6 %
Single Plane A4C EF: 41.7 %

## 2020-09-25 MED ORDER — SPIRONOLACTONE 50 MG PO TABS
50.0000 mg | ORAL_TABLET | Freq: Every day | ORAL | 6 refills | Status: DC
Start: 2020-09-25 — End: 2021-04-08

## 2020-09-25 NOTE — Progress Notes (Signed)
Patient ID: Anita Warren, female   DOB: 07/27/1961, 59 y.o.   MRN: [id number]     Advanced Heart Failure Clinic Note   Primary Physician: FUSCO,LAWRENCE J., MD  HF Cardiology: Dr. McLean EP: Dr Allred  Urology: Dr Grapey  HPI: Anita Warren is a 59 y.o. female with history of viral, non-ischemic cardiomyopathy, severe systolic dysfunction with EF of 10% s/p ICD pacemaker in the setting of VT 02/2011 (St. Jude);  cath 2012 with no CAD.   Admitted 7/15 with acute on chronic systolic CHF she was not taking medications for 4 months because she lost her insurance. She was diuresed with IV lasix and transitioned to lasix 40 mg daily. She was also discharged on 12.5 mg carvedilol twice a day, hydralazine 25 mg tid, and 30 mg Imdur daily. Discharge weight was 176 pounds. She was not on an ACEI due to angioedema.  Admitted from HF clinic 06/02/14 with low output heart failure. Swan placed and showed cardiogenic shock. Ultimately discharged on  Milrinone 0.25 mcg/kg/min. Hospital stay was complicated by atrial flutter. Loaded on amiodarone and started on eliquis 5 mg twice a day. Met with Dr Van Trigt and VAD coordinator. She was titrated off the milrinone.  Echo in 8/17 showed EF up to 50%, echo in 8/18 showed EF 45%.  Echo in 11/19 showed EF 35-40%. Echo was done today and reviewed, EF stable 40% with normal RV.   She presents for followup of CHF.  No significant exertional dyspnea.  No lightheadedness.  BP mildly elevated, she has taken all her morning meds.  No chest pain.  No orthopnea/PND.  Main problem recently has been depression/grief.  Her fiance died this summer just before they got married. Weight is up 4 lbs.    ECG (personally reviewed): NSR, LVH, poor RWP.   St Jude device interrogation: stable thoracic impedance, no VT.   05/23/14 ECHO EF 5-10% RV mod to severely dilated.  08/09/14 ECHO EF 20-25% RV read as normal 8/17 ECHO EF 50%, mild LV dilation, normal RV size and  systolic function.  8/18 ECHO EF 45%, diffuse HK worse inferiorly, normal RV size and systolic function.  11/19 ECHO EF 35-40%, mild LV dilation with diffuse hypokinesis, normal RV size with mildly decreased systolic function.  11/21 ECHO EF 40%, hypokinesis of the basal to mid septum and the basal inferior wall, RV normal, small pericardial effusion.   Labs:  HIV negative 2015  9/15: K 3.6 => 4.6, creatinine 0.99 => 1.5 => 1.2, Mg 1.9, HCT 45.2, LFTs normal, TSH normal 9/15: PFTs normal 07/31/14: Co-ox off milrinone 66%  K 4.5 Cr 1.36 08/23/14: k 4.4, cr 1.51 digoxin 0.7 09/20/14: K 4.9, creatinine 1.64  12/15: K 4.4, creatinine 1.36 2/16: K 4.2, creatinine 1.67, HCT 33.9 04/28/2016: K 4.1 Creatinine 2.67  8/17: TSH normal, LFTs normal 9/17: K 3.5, creatinine 1.67, hgb 9.9 5/18: hgb 10.7, K 3.8, creatinine 1.45 9/18: K 4.4, creatinine 1.53 4/19: K 3.8, creatinine 1.32, hgb 10.5 6/21: K 4.3, creatinine 1.34, LFTs normal 7/21: hgb 10.4 8/21: K 3.9, creatinine 1.38  2013 Colonoscopy Blood Type:  B+  Lower Extremity Doppler - Normal arterial flow Carotid Doppler-  1-39% stenosis involving the right internal carotid artery and the left internal carotid Artery.  SH: Disabled. Has 3 grown children. Does not smoke or drink alcohol.  Not married (fiance passed away in 2021). Religion: She is a [employer]ec[employer]al.    FH: Mom died at 54 CAD           Sister and Brother HTN   Review of systems complete and found to be negative unless listed in HPI.    Past Medical History:  Diagnosis Date  . AICD (automatic cardioverter/defibrillator) present    St. Jude AICD implanted 03-07-2011 Dr. Allred/-Dr. McLean now follows  . CHF (congestive heart failure) (HCC)    meds controlling, no episodes since 2014  . Chronic systolic heart failure (HCC)   . Essential hypertension, benign   . Headache   . History of kidney stones    multiple kidney stones in past  . History of medication noncompliance   .  Hydronephrosis with renal and ureteral calculus obstruction 09/05/2013  . Nonischemic cardiomyopathy (HCC)    LVEF 5-10%, likely viral (no CAD by cath 01/30/11)  . NSVT (nonsustained ventricular tachycardia) (HCC)   . Obesity     Current Outpatient Medications  Medication Sig Dispense Refill  . acetaminophen (TYLENOL) 500 MG tablet Take 1,000 mg by mouth every 6 (six) hours as needed for headache.     . apixaban (ELIQUIS) 5 MG TABS tablet Take 1 tablet (5 mg total) by mouth 2 (two) times daily. 180 tablet 3  . carvedilol (COREG) 25 MG tablet Take 1 tablet (25 mg total) by mouth 2 (two) times daily with a meal. 180 tablet 3  . Coenzyme Q10 (CO Q 10) 100 MG CAPS Take 100 mg by mouth daily.     . dapagliflozin propanediol (FARXIGA) 10 MG TABS tablet Take 1 tablet (10 mg total) by mouth daily before breakfast. 30 tablet 5  . diphenhydrAMINE (BENADRYL) 25 MG tablet Take 25 mg by mouth every 6 (six) hours as needed (headache).     . furosemide (LASIX) 20 MG tablet Take 1 tablet (20 mg total) by mouth daily. 90 tablet 3  . hydrALAZINE (APRESOLINE) 100 MG tablet Take 1 tablet (100 mg total) by mouth 3 (three) times daily. 90 tablet 11  . isosorbide mononitrate (IMDUR) 30 MG 24 hr tablet Take 3 tablets by mouth once daily 90 tablet 11  . potassium chloride SA (KLOR-CON) 10 MEQ tablet Take 1 tablet (10 mEq total) by mouth daily. 90 tablet 5  . spironolactone (ALDACTONE) 50 MG tablet Take 1 tablet (50 mg total) by mouth daily. 30 tablet 6   No current facility-administered medications for this encounter.   PHYSICAL EXAM: Vitals:   09/25/20 1057  BP: 140/80  Pulse: 64  SpO2: 99%  Weight: 84.9 kg (187 lb 3.2 oz)    Wt Readings from Last 3 Encounters:  09/25/20 84.9 kg (187 lb 3.2 oz)  08/15/20 82.6 kg (182 lb)  08/01/20 82.6 kg (182 lb)    General: NAD Neck: No JVD, no thyromegaly or thyroid nodule.  Lungs: Clear to auscultation bilaterally with normal respiratory effort. CV: Nondisplaced  PMI.  Heart regular S1/S2, no S3/S4, no murmur.  No peripheral edema.  No carotid bruit.  Normal pedal pulses.  Abdomen: Soft, nontender, no hepatosplenomegaly, no distention.  Skin: Intact without lesions or rashes.  Neurologic: Alert and oriented x 3.  Psych: Normal affect. Extremities: No clubbing or cyanosis.  HEENT: Normal.   ASSESSMENT & PLAN: 1. Chronic Systolic Heart Failure: Nonischemic cardiomyopathy thought to potentially be due to viral myocarditis noted initially in 2011. Cath 2012 normal coronaries.  St Jude ICD. Echo (7/15) with EF 5-10% and RV mod-severely dilated with moderately decreased systolic function. Repeat echo 10/15 with EF up to 20-25%. She was on milrinone briefly then stopped.  Echo in 8/17 with   EF up to 50%. Repeat echo 11/19 showed EF back down to 35-40% with normal RV. Echo today with stable EF 40% and septal + inferior wall motion abnormalities.  NYHA class I-II, she is not volume overloaded by exam or Corvue.  - Continue Lasix 20 mg daily, BMET today.   - Increase spironolactone to 50 mg daily with HTN and on other meds at maximum doses. BMET today and in 10 days.    - No ACEI/ARB/ARNI due to recurrent episodes of angioedema with ACEI and also ARB.  - Continue Coreg 25 mg bid.   - Continue hydralazine 100 mg tid and Imdur 90 mg daily.   2. Atrial flutter: Paroxysmal, NSR today.  - She is now off amiodarone.  If atrial flutter recurs, should have ablation.  - Continue Eliquis 5 mg BID. CBC today.  3. Angioedema: Had in past on ACEI, had another episode after ACEI stopped.  She is no longer on ACEI, ARNI, or ARB. ?Hereditary or acquired C1 inhibitor deficiency. No further problems with this. 4. CKD: Stage 3 - BMET today.    Followup in 4 months.   Loralie Champagne, MD  09/25/2020

## 2020-09-25 NOTE — Progress Notes (Signed)
  Echocardiogram 2D Echocardiogram has been performed.  Anita Warren 09/25/2020, 10:54 AM

## 2020-09-25 NOTE — Patient Instructions (Signed)
Increase Spironolactone to 50 mg Daily  Your physician recommends that you return for lab work in: 10 days, we have provided you a prescription to have this done locally  Please call our office in March 2022 to schedule your follow-up appointment  If you have any questions or concerns before your next appointment please send [place] a message through [place] or call our office at [phone].    TO LEAVE A MESSAGE FOR THE NURSE SELECT OPTION 2, PLEASE LEAVE A MESSAGE INCLUDING:  YOUR NAME  DATE OF BIRTH  CALL BACK NUMBER  REASON FOR CALL**this is important as we prioritize the call backs  YOU WILL RECEIVE A CALL BACK THE SAME DAY AS LONG AS YOU CALL BEFORE 4:00 PM  At the [facility], you and your health needs are our priority. As part of our continuing mission to provide you with exceptional heart care, we have created designated Provider Care Teams. These Care Teams include your primary Cardiologist (physician) and Advanced Practice Providers (APPs- Physician Assistants and Nurse Practitioners) who all work together to provide you with the care you need, when you need it.   You may see any of the following providers on your designated Care Team at your next follow up:  Dr Glori Bickers  Dr Haynes Kerns, NP  Lyda Jester, [place]  Audry Riles, PharmD   Please be sure to bring in all your medications bottles to every appointment.

## 2020-09-26 LAB — CUP PACEART REMOTE DEVICE CHECK
Battery Remaining Longevity: 28 mo
Battery Remaining Percentage: 24 %
Battery Voltage: 2.8 V
Brady Statistic RV Percent Paced: 1 %
Date Time Interrogation Session: 20211130105130
HighPow Impedance: 40 Ohm
Implantable Lead Implant Date: 20120511
Implantable Lead Location: 753860
Implantable Lead Model: 7120
Implantable Pulse Generator Implant Date: 20120511
Lead Channel Impedance Value: 540 Ohm
Lead Channel Pacing Threshold Amplitude: 0.75 V
Lead Channel Pacing Threshold Pulse Width: 0.5 ms
Lead Channel Sensing Intrinsic Amplitude: 11.7 mV
Lead Channel Setting Pacing Amplitude: 2.5 V
Lead Channel Setting Pacing Pulse Width: 0.5 ms
Lead Channel Setting Sensing Sensitivity: 0.5 mV
Pulse Gen Serial Number: 634653

## 2020-09-28 NOTE — Progress Notes (Signed)
Remote ICD transmission.   

## 2020-10-08 ENCOUNTER — Ambulatory Visit (INDEPENDENT_AMBULATORY_CARE_PROVIDER_SITE_OTHER): Payer: Medicare Other

## 2020-10-08 DIAGNOSIS — I5022 Chronic systolic (congestive) heart failure: Secondary | ICD-10-CM | POA: Diagnosis not present

## 2020-10-08 DIAGNOSIS — Z95810 Presence of automatic (implantable) cardiac defibrillator: Secondary | ICD-10-CM | POA: Diagnosis not present

## 2020-10-10 NOTE — Progress Notes (Signed)
EPIC Encounter for ICM Monitoring  Patient Name: Anita Warren is a 59 y.o. female Date: 10/10/2020 Primary Care Physican: Redmond School, MD Primary Cardiologist: [name]  Electrophysiologist: Allred 11/30/2021OfficeWeight:187lbs   Spoke with patient and reports feeling well at this time.  Denies fluid symptoms.    Coruve thoracic impedancenormal fluid levels.  Prescribed:  Furosemide20 mgtake1tablet (20 mg total)daily.   Potassium10 mEqtake1tablet daily.  Spironolactone 50 mg take 1 tablet daily  Labs: 06/05/2020 Creatinine 1.38, BUN 24, Potassium 3.9, Sodium 138, GFR 42-49 05/22/2020 Creatinine1.32, BUN21, Potassium4.0, Sodium138, LKG40-10 A complete set of results can be found in Results Review.  Recommendations: No changes and encouraged to call if experiencing any fluid symptoms.  Follow-up plan: ICM clinic phone appointment on2/10/2020 91 day device clinic remote transmission2/28/2022.  EP/Cardiology Office Visits: 08/02/2021 with Dr Rayann Heman. Appointment due with Dr [name] 12/2020 (no recall)  Copy of ICM check sent to Dr.Allred.  3 month ICM trend: 10/08/2020    1 Year ICM trend:       Rosalene Billings, RN 10/10/2020 11:50 AM

## 2020-10-11 ENCOUNTER — Other Ambulatory Visit (HOSPITAL_COMMUNITY): Payer: Self-pay | Admitting: Cardiology

## 2020-10-26 DIAGNOSIS — I5022 Chronic systolic (congestive) heart failure: Secondary | ICD-10-CM | POA: Diagnosis not present

## 2020-10-26 DIAGNOSIS — I130 Hypertensive heart and chronic kidney disease with heart failure and stage 1 through stage 4 chronic kidney disease, or unspecified chronic kidney disease: Secondary | ICD-10-CM | POA: Diagnosis not present

## 2020-10-26 DIAGNOSIS — I428 Other cardiomyopathies: Secondary | ICD-10-CM | POA: Diagnosis not present

## 2020-10-26 DIAGNOSIS — N183 Chronic kidney disease, stage 3 unspecified: Secondary | ICD-10-CM | POA: Diagnosis not present

## 2020-11-27 ENCOUNTER — Other Ambulatory Visit (HOSPITAL_COMMUNITY): Payer: Self-pay | Admitting: Cardiology

## 2020-11-27 ENCOUNTER — Ambulatory Visit (INDEPENDENT_AMBULATORY_CARE_PROVIDER_SITE_OTHER): Payer: Medicare Other

## 2020-11-27 DIAGNOSIS — Z95810 Presence of automatic (implantable) cardiac defibrillator: Secondary | ICD-10-CM

## 2020-11-27 DIAGNOSIS — I5022 Chronic systolic (congestive) heart failure: Secondary | ICD-10-CM

## 2020-11-27 NOTE — Progress Notes (Signed)
EPIC Encounter for ICM Monitoring  Patient Name: Anita Warren is a 60 y.o. female Date: 11/27/2020 Primary Care Physican: Redmond School, MD Primary Cardiologist: [name]  Electrophysiologist: Allred 11/27/2020 Weight:187lbs   Spoke with patient and reports feeling well at this time.  Denies fluid symptoms.    Coruve thoracic impedancesuggesting possible fluid accumulation starting 11/25/2020.  Prescribed:  Furosemide20 mgtake1tablet (20 mg total)daily.   Potassium10 mEqtake1tablet daily.  Spironolactone 50 mg take 1 tablet daily  Labs: 10/08/2020 Creatinine 1.39, BUN 21, Potassium 3.9, Sodium 135, GFR 41-48 06/05/2020 Creatinine 1.38, BUN 24, Potassium 3.9, Sodium 138, GFR 42-49 05/22/2020 Creatinine1.32, BUN21, Potassium4.0, Sodium138, [id number] A complete set of results can be found in Results Review.  Recommendations: Advised to limit salt and fluid intake.  Will send copy to Dr [name] for review and recommendations if needed.   Follow-up plan: ICM clinic phone appointment on2/14/2022to recheck fluid levels91 day device clinic remote transmission2/28/2022.  EP/Cardiology Office Visits: 08/02/2021 with Dr Rayann Heman. Appointment due with Dr [name] 12/2020 (no recall)  Copy of ICM check sent to Dr.Allred and Dr [name] for review and recommendations if needed.  3 month ICM trend: 11/27/2020.    1 Year ICM trend:       Rosalene Billings, RN 11/27/2020 11:30 AM

## 2020-12-03 NOTE — Progress Notes (Signed)
Recheck this week.

## 2020-12-03 NOTE — Progress Notes (Signed)
ICM Recheck scheduled for 12/05/2020

## 2020-12-05 ENCOUNTER — Ambulatory Visit (INDEPENDENT_AMBULATORY_CARE_PROVIDER_SITE_OTHER): Payer: Medicare Other

## 2020-12-05 DIAGNOSIS — Z95810 Presence of automatic (implantable) cardiac defibrillator: Secondary | ICD-10-CM

## 2020-12-05 DIAGNOSIS — I5022 Chronic systolic (congestive) heart failure: Secondary | ICD-10-CM

## 2020-12-10 NOTE — Progress Notes (Signed)
EPIC Encounter for ICM Monitoring  Patient Name: Anita Warren is a 60 y.o. female Date: 12/10/2020 Primary Care Physican: Redmond School, MD Primary Cardiologist: [name]  Electrophysiologist: Allred 11/27/2020 Weight:187lbs   Spoke with patient and reports feeling well at this time.  Denies fluid symptoms.    Coruve thoracic impedancesuggesting fluid levels returned to normal.  Prescribed:  Furosemide20 mgtake1tablet (20 mg total)daily.   Potassium10 mEqtake1tablet daily.  Spironolactone50mg  take 1 tablet daily  Labs: 10/08/2020 Creatinine 1.39, BUN 21, Potassium 3.9, Sodium 135, GFR 41-48 06/05/2020 Creatinine 1.38, BUN 24, Potassium 3.9, Sodium 138, GFR 42-49 05/22/2020 Creatinine1.32, BUN21, Potassium4.0, Sodium138, [id number] A complete set of results can be found in Results Review.  Recommendations: No changes and encouraged to call if experiencing any fluid symptoms.   Follow-up plan: ICM clinic phone appointment on3/14/2022. 91 day device clinic remote transmission2/28/2022.  EP/Cardiology Office Visits: 08/02/2021 with Dr Rayann Heman. Appointment due with Dr [name] 12/2020 (no recall)  Copy of ICM check sent to Dr.Allred and Dr [name] to show fluid levels returned to normal.     3 month ICM trend: 12/10/2020.    1 Year ICM trend:       Rosalene Billings, RN 12/10/2020 11:28 AM

## 2020-12-24 ENCOUNTER — Ambulatory Visit (INDEPENDENT_AMBULATORY_CARE_PROVIDER_SITE_OTHER): Payer: Medicare Other

## 2020-12-24 DIAGNOSIS — I429 Cardiomyopathy, unspecified: Secondary | ICD-10-CM

## 2020-12-26 LAB — CUP PACEART REMOTE DEVICE CHECK
Battery Remaining Longevity: 25 mo
Battery Remaining Percentage: 22 %
Battery Voltage: 2.78 V
Brady Statistic RV Percent Paced: 1 %
Date Time Interrogation Session: 20220301135920
HighPow Impedance: 40 Ohm
HighPow Impedance: 43 Ohm
Implantable Lead Implant Date: 20120511
Implantable Lead Location: 753860
Implantable Lead Model: 7120
Implantable Pulse Generator Implant Date: 20120511
Lead Channel Impedance Value: 580 Ohm
Lead Channel Pacing Threshold Amplitude: 0.75 V
Lead Channel Pacing Threshold Pulse Width: 0.5 ms
Lead Channel Sensing Intrinsic Amplitude: 11.7 mV
Lead Channel Setting Pacing Amplitude: 2.5 V
Lead Channel Setting Pacing Pulse Width: 0.5 ms
Lead Channel Setting Sensing Sensitivity: 0.5 mV
Pulse Gen Serial Number: 634653

## 2020-12-27 ENCOUNTER — Other Ambulatory Visit (HOSPITAL_COMMUNITY): Payer: Self-pay | Admitting: Cardiology

## 2020-12-31 NOTE — Progress Notes (Signed)
Remote ICD transmission.   

## 2021-01-07 ENCOUNTER — Ambulatory Visit (INDEPENDENT_AMBULATORY_CARE_PROVIDER_SITE_OTHER): Payer: Medicare Other

## 2021-01-07 DIAGNOSIS — I5022 Chronic systolic (congestive) heart failure: Secondary | ICD-10-CM

## 2021-01-07 DIAGNOSIS — Z95810 Presence of automatic (implantable) cardiac defibrillator: Secondary | ICD-10-CM

## 2021-01-07 NOTE — Progress Notes (Signed)
EPIC Encounter for ICM Monitoring  Patient Name: Anita Warren is a 60 y.o. female Date: 01/07/2021 Primary Care Physican: Redmond School, MD Primary Cardiologist: [name]  Electrophysiologist: Allred 3/14/2022Weight:187lbs   Spoke with patient and reports feeling well at this time.  Denies fluid symptoms.  She ate foods that tasted really salty over the weekend that may contribute to fluid accumulation.  She has returned to low salt diet.   Coruve thoracic impedancesuggesting possible fluid accumulation starting 01/01/2021.      Prescribed:  Furosemide20 mgtake1tablet (20 mg total)daily.   Potassium10 mEqtake1tablet daily.  Spironolactone50mg  take 1 tablet daily  Labs: 10/08/2020 Creatinine 1.39, BUN 21, Potassium 3.9, Sodium 135, GFR 41-48 06/05/2020 Creatinine 1.38, BUN 24, Potassium 3.9, Sodium 138, GFR 42-49 05/22/2020 Creatinine1.32, BUN21, Potassium4.0, Sodium138, [id number] A complete set of results can be found in Results Review.  Recommendations: Recommendation to limit salt intake to 2000 mg daily and fluid intake to 64 oz daily.  Encouraged to call if experiencing any fluid symptoms.   Follow-up plan: ICM clinic phone appointment on3/22/2022 to recheck fluid levels. 91 day device clinic remote transmission5/31/2022.  EP/Cardiology Office Visits: 08/02/2021 with Dr Rayann Heman. Appointment due with Dr [name] 12/2020 (no recall)  Copy of ICM check sent to Dr.Allred  3 month ICM trend: 01/07/2021.    1 Year ICM trend:       Rosalene Billings, RN 01/07/2021 11:42 AM

## 2021-01-15 ENCOUNTER — Ambulatory Visit (INDEPENDENT_AMBULATORY_CARE_PROVIDER_SITE_OTHER): Payer: Medicare Other

## 2021-01-15 DIAGNOSIS — Z95810 Presence of automatic (implantable) cardiac defibrillator: Secondary | ICD-10-CM

## 2021-01-15 DIAGNOSIS — I5022 Chronic systolic (congestive) heart failure: Secondary | ICD-10-CM

## 2021-01-15 NOTE — Progress Notes (Signed)
EPIC Encounter for ICM Monitoring  Patient Name: Anita Warren is a 60 y.o. female Date: 01/15/2021 Primary Care Physican: Redmond School, MD Primary Cardiologist: [name]  Electrophysiologist: Allred 3/22/2022Weight:185lbs   Spoke with patient and reports feeling well at this time.  Denies fluid symptoms.     Coruve thoracic impedancesuggestingfluid levels returned to normal.      Prescribed:  Furosemide20 mgtake1tablet (20 mg total)daily.   Potassium10 mEqtake1tablet daily.  Spironolactone50mg  take 1 tablet daily  Labs: 10/08/2020 Creatinine 1.39, BUN 21, Potassium 3.9, Sodium 135, GFR 41-48 06/05/2020 Creatinine 1.38, BUN 24, Potassium 3.9, Sodium 138, GFR 42-49 05/22/2020 Creatinine1.32, BUN21, Potassium4.0, Sodium138, ZOX09-60 A complete set of results can be found in Results Review.  Recommendations: Recommendation to limit salt intake to 2000 mg daily and fluid intake to 64 oz daily.  Encouraged to call if experiencing any fluid symptoms.   Follow-up plan: ICM clinic phone appointment on4/19/2022.91 day device clinic remote transmission5/31/2022.  EP/Cardiology Office Visits: 08/02/2021 with Dr Rayann Heman. Appointment due with Dr [name] 12/2020 (no recall)  Copy of ICM check sent to Dr.Allred  3 month ICM trend: 01/15/2021.    1 Year ICM trend:       Rosalene Billings, RN 01/15/2021 4:39 PM

## 2021-02-12 ENCOUNTER — Ambulatory Visit (INDEPENDENT_AMBULATORY_CARE_PROVIDER_SITE_OTHER): Payer: Medicare Other

## 2021-02-12 DIAGNOSIS — I5022 Chronic systolic (congestive) heart failure: Secondary | ICD-10-CM | POA: Diagnosis not present

## 2021-02-12 DIAGNOSIS — Z95810 Presence of automatic (implantable) cardiac defibrillator: Secondary | ICD-10-CM

## 2021-02-15 NOTE — Progress Notes (Signed)
EPIC Encounter for ICM Monitoring  Patient Name: Anita Warren is a 60 y.o. female Date: 02/15/2021 Primary Care Physican: Redmond School, MD Primary Cardiologist: [name]  Electrophysiologist: Allred 4/22/2022Weight:180lbs   Spoke with patient and reports feeling well at this time. Denies fluid symptoms.    Coruve thoracic impedancesuggestingnormal fluid levels.  Prescribed:  Furosemide20 mgtake1tablet (20 mg total)daily.   Potassium10 mEqtake1tablet daily.  Spironolactone50mg  take 1 tablet daily  Labs: 10/08/2020 Creatinine 1.39, BUN 21, Potassium 3.9, Sodium 135, GFR 41-48 06/05/2020 Creatinine 1.38, BUN 24, Potassium 3.9, Sodium 138, GFR 42-49 05/22/2020 Creatinine1.32, BUN21, Potassium4.0, Sodium138, [id number] A complete set of results can be found in Results Review.  Recommendations: Encouraged to call if experiencing any fluid symptoms.   Follow-up plan: ICM clinic phone appointment on6/10/2020.91 day device clinic remote transmission5/31/2022.  EP/Cardiology Office Visits: 08/02/2021 with Dr Rayann Heman. Appointment due with Dr [name] 12/2020 (no recall)  Copy of ICM check sent to Dr.Allred   3 month ICM trend: 02/12/2021.    1 Year ICM trend:       Rosalene Billings, RN 02/15/2021 12:35 PM

## 2021-02-23 DIAGNOSIS — I130 Hypertensive heart and chronic kidney disease with heart failure and stage 1 through stage 4 chronic kidney disease, or unspecified chronic kidney disease: Secondary | ICD-10-CM | POA: Diagnosis not present

## 2021-02-23 DIAGNOSIS — N183 Chronic kidney disease, stage 3 unspecified: Secondary | ICD-10-CM | POA: Diagnosis not present

## 2021-02-23 DIAGNOSIS — I5022 Chronic systolic (congestive) heart failure: Secondary | ICD-10-CM | POA: Diagnosis not present

## 2021-03-25 LAB — CUP PACEART REMOTE DEVICE CHECK
Battery Remaining Longevity: 23 mo
Battery Remaining Percentage: 19 %
Battery Voltage: 2.77 V
Brady Statistic RV Percent Paced: 1 %
Date Time Interrogation Session: 20220530115119
HighPow Impedance: 40 Ohm
Implantable Lead Implant Date: 20120511
Implantable Lead Location: 753860
Implantable Lead Model: 7120
Implantable Pulse Generator Implant Date: 20120511
Lead Channel Impedance Value: 490 Ohm
Lead Channel Pacing Threshold Amplitude: 0.75 V
Lead Channel Pacing Threshold Pulse Width: 0.5 ms
Lead Channel Sensing Intrinsic Amplitude: 10.3 mV
Lead Channel Setting Pacing Amplitude: 2.5 V
Lead Channel Setting Pacing Pulse Width: 0.5 ms
Lead Channel Setting Sensing Sensitivity: 0.5 mV
Pulse Gen Serial Number: 634653

## 2021-03-26 ENCOUNTER — Ambulatory Visit (INDEPENDENT_AMBULATORY_CARE_PROVIDER_SITE_OTHER): Payer: Medicare Other

## 2021-03-26 DIAGNOSIS — I428 Other cardiomyopathies: Secondary | ICD-10-CM | POA: Diagnosis not present

## 2021-03-27 ENCOUNTER — Ambulatory Visit (INDEPENDENT_AMBULATORY_CARE_PROVIDER_SITE_OTHER): Payer: Medicare Other

## 2021-03-27 DIAGNOSIS — I5022 Chronic systolic (congestive) heart failure: Secondary | ICD-10-CM

## 2021-03-27 DIAGNOSIS — Z95810 Presence of automatic (implantable) cardiac defibrillator: Secondary | ICD-10-CM | POA: Diagnosis not present

## 2021-03-29 NOTE — Progress Notes (Signed)
EPIC Encounter for ICM Monitoring  Patient Name: Anita Warren is a 60 y.o. female Date: 03/29/2021 Primary Care Physican: Redmond School, MD Primary Cardiologist: [name]  Electrophysiologist: Allred 6/3/2022Weight:178lbs   Spoke with patient and reports feeling well at this time. Denies fluid symptoms. She was drinking a lot of fluid memorial day weekend to stay hydrated.   Coruve thoracic impedancesuggestingpossible fluid accumulation starting 03/26/2021 but almost back at baseline normal 03/28/2021.  Prescribed:  Furosemide20 mgtake1tablet (20 mg total)daily.   Potassium10 mEqtake1tablet daily.  Spironolactone50mg  take 1 tablet daily  Labs: 10/08/2020 Creatinine 1.39, BUN 21, Potassium 3.9, Sodium 135, GFR 41-48 06/05/2020 Creatinine 1.38, BUN 24, Potassium 3.9, Sodium 138, GFR 42-49 05/22/2020 Creatinine1.32, BUN21, Potassium4.0, Sodium138, [id number] A complete set of results can be found in Results Review.  Recommendations: Encouraged to call if experiencing any fluid symptoms.   Follow-up plan: ICM clinic phone appointment on 04/30/2021.91 day device clinic remote transmission 06/24/2021.  EP/Cardiology Office Visits: 08/02/2021 with Dr Rayann Heman. Appointment due with Dr [name] 12/2020 (no recall)  Copy of ICM check sent to Dr.Allred   Direct Trend 3 month 03/28/2021.   3 month ICM trend: 03/28/2021.    1 Year ICM trend:       Rosalene Billings, RN 03/29/2021 1:34 PM

## 2021-04-04 ENCOUNTER — Other Ambulatory Visit (HOSPITAL_COMMUNITY): Payer: Self-pay | Admitting: Cardiology

## 2021-04-17 NOTE — Progress Notes (Signed)
Remote ICD transmission.   

## 2021-04-25 DIAGNOSIS — I130 Hypertensive heart and chronic kidney disease with heart failure and stage 1 through stage 4 chronic kidney disease, or unspecified chronic kidney disease: Secondary | ICD-10-CM | POA: Diagnosis not present

## 2021-04-25 DIAGNOSIS — N183 Chronic kidney disease, stage 3 unspecified: Secondary | ICD-10-CM | POA: Diagnosis not present

## 2021-04-25 DIAGNOSIS — I5022 Chronic systolic (congestive) heart failure: Secondary | ICD-10-CM | POA: Diagnosis not present

## 2021-04-30 ENCOUNTER — Ambulatory Visit (INDEPENDENT_AMBULATORY_CARE_PROVIDER_SITE_OTHER): Payer: Medicare Other

## 2021-04-30 ENCOUNTER — Telehealth: Payer: Self-pay

## 2021-04-30 DIAGNOSIS — Z95810 Presence of automatic (implantable) cardiac defibrillator: Secondary | ICD-10-CM

## 2021-04-30 DIAGNOSIS — I5022 Chronic systolic (congestive) heart failure: Secondary | ICD-10-CM | POA: Diagnosis not present

## 2021-04-30 NOTE — Telephone Encounter (Signed)
Spoke with patient.  Requested to send manual remote transmission to review fluid levels since it was not transmitted automatically today.  She is doing well and denies any fluid symptoms. She will send report today.

## 2021-05-01 NOTE — Progress Notes (Signed)
EPIC Encounter for ICM Monitoring  Patient Name: Anita Warren is a 60 y.o. female Date: 05/01/2021 Primary Care Physican: Redmond School, MD Primary Cardiologist: [name] Electrophysiologist: Allred 03/29/2021 Weight: 178 lbs                                                             Spoke with patient and heart failure questions reviewed.  Pt asymptomatic for fluid accumulation and feeing well.   Coruve thoracic impedance suggesting normal fluid levels.    Prescribed:  Furosemide 20 mg take 1 tablet (20 mg total) daily.   Potassium 10 mEq take 1 tablet daily. Spironolactone 50 mg take 1 tablet daily     Labs: 10/08/2020 Creatinine 1.39, BUN 21, Potassium 3.9, Sodium 135, GFR 41-48 06/05/2020 Creatinine 1.38, BUN 24, Potassium 3.9, Sodium 138, GFR 42-49 05/22/2020 Creatinine 1.32, BUN 21, Potassium 4.0, Sodium 138, GFR 44-51 A complete set of results can be found in Results Review.   Recommendations:  Encouraged to call if experiencing any fluid symptoms.   Follow-up plan: ICM clinic phone appointment on 06/03/2021.   91 day device clinic remote transmission 06/24/2021.      EP/Cardiology Office Visits:  08/02/2021 with Dr Rayann Heman.  Appointment due with Dr [name] 12/2020 (no recall)   Copy of ICM check sent to Dr. Rayann Heman.   3 month ICM trend: 04/30/2021.    1 Year ICM trend:       Rosalene Billings, RN 05/01/2021 2:22 PM

## 2021-05-15 DIAGNOSIS — E039 Hypothyroidism, unspecified: Secondary | ICD-10-CM | POA: Diagnosis not present

## 2021-05-15 DIAGNOSIS — Z1389 Encounter for screening for other disorder: Secondary | ICD-10-CM | POA: Diagnosis not present

## 2021-05-15 DIAGNOSIS — Z0001 Encounter for general adult medical examination with abnormal findings: Secondary | ICD-10-CM | POA: Diagnosis not present

## 2021-05-15 DIAGNOSIS — I429 Cardiomyopathy, unspecified: Secondary | ICD-10-CM | POA: Diagnosis not present

## 2021-05-15 DIAGNOSIS — E782 Mixed hyperlipidemia: Secondary | ICD-10-CM | POA: Diagnosis not present

## 2021-05-15 DIAGNOSIS — R7309 Other abnormal glucose: Secondary | ICD-10-CM | POA: Diagnosis not present

## 2021-05-26 DIAGNOSIS — N183 Chronic kidney disease, stage 3 unspecified: Secondary | ICD-10-CM | POA: Diagnosis not present

## 2021-05-26 DIAGNOSIS — I5022 Chronic systolic (congestive) heart failure: Secondary | ICD-10-CM | POA: Diagnosis not present

## 2021-05-26 DIAGNOSIS — I130 Hypertensive heart and chronic kidney disease with heart failure and stage 1 through stage 4 chronic kidney disease, or unspecified chronic kidney disease: Secondary | ICD-10-CM | POA: Diagnosis not present

## 2021-05-28 ENCOUNTER — Other Ambulatory Visit (HOSPITAL_COMMUNITY): Payer: Self-pay | Admitting: Cardiology

## 2021-05-28 DIAGNOSIS — I5022 Chronic systolic (congestive) heart failure: Secondary | ICD-10-CM

## 2021-05-29 ENCOUNTER — Other Ambulatory Visit (HOSPITAL_COMMUNITY): Payer: Self-pay | Admitting: Cardiology

## 2021-06-03 ENCOUNTER — Ambulatory Visit (INDEPENDENT_AMBULATORY_CARE_PROVIDER_SITE_OTHER): Payer: Medicare Other

## 2021-06-03 DIAGNOSIS — I5022 Chronic systolic (congestive) heart failure: Secondary | ICD-10-CM | POA: Diagnosis not present

## 2021-06-03 DIAGNOSIS — Z95810 Presence of automatic (implantable) cardiac defibrillator: Secondary | ICD-10-CM

## 2021-06-05 NOTE — Progress Notes (Signed)
EPIC Encounter for ICM Monitoring  Patient Name: Anita Warren is a 60 y.o. female Date: 06/05/2021 Primary Care Physican: Redmond School, MD Primary Cardiologist: [name] Electrophysiologist: Allred 06/05/2021 Weight: 178 lbs                                                             Spoke with patient and heart failure questions reviewed.  Pt reports she has a cold at this time and not drinking a lot of fluids.  No dehydration symptoms.   She was eating out a lot during month of July which may have contributed decreased impedance.  She thinks the restaurant [employer] has a lot of salt in food and confirmed that restaurant foods are typically very high in salt.   Weight increased to 182 lbs during decreased impedance.   Coruve thoracic impedance suggesting dryness since 7/29 but was suggesting possible fluid accumulation from 7/11-7/28.     Prescribed:  Furosemide 20 mg take 1 tablet (20 mg total) daily.   Potassium 10 mEq take 1 tablet daily. Spironolactone 50 mg take 1 tablet daily     Labs: 10/08/2020 Creatinine 1.39, BUN 21, Potassium 3.9, Sodium 135, GFR 41-48 06/05/2020 Creatinine 1.38, BUN 24, Potassium 3.9, Sodium 138, GFR 42-49 05/22/2020 Creatinine 1.32, BUN 21, Potassium 4.0, Sodium 138, GFR 44-51 A complete set of results can be found in Results Review.   Recommendations:  Encouraged to increase fluid intake above 64 oz x 1-2 days.     Follow-up plan: ICM clinic phone appointment on 06/13/2021 to recheck fluid levels.   91 day device clinic remote transmission 06/24/2021.      EP/Cardiology Office Visits:  08/02/2021 with Dr Rayann Heman.  Appointment due with Dr [name] 12/2020 (no recall)   Copy of ICM check sent to Dr. Rayann Heman.   3 month ICM trend: 06/05/2021.    1 Year ICM trend:       Rosalene Billings, RN 06/05/2021 2:07 PM

## 2021-06-14 NOTE — Progress Notes (Signed)
No ICM remote transmission received for 06/13/2021 and next ICM transmission scheduled for 07/08/2021.

## 2021-06-18 ENCOUNTER — Other Ambulatory Visit (HOSPITAL_COMMUNITY): Payer: Self-pay | Admitting: Cardiology

## 2021-06-24 ENCOUNTER — Ambulatory Visit (INDEPENDENT_AMBULATORY_CARE_PROVIDER_SITE_OTHER): Payer: Medicare Other

## 2021-06-24 DIAGNOSIS — I428 Other cardiomyopathies: Secondary | ICD-10-CM

## 2021-06-25 ENCOUNTER — Telehealth: Payer: Self-pay

## 2021-06-25 LAB — CUP PACEART REMOTE DEVICE CHECK
Battery Remaining Longevity: 19 mo
Battery Remaining Percentage: 16 %
Battery Voltage: 2.74 V
Brady Statistic RV Percent Paced: 1 %
Date Time Interrogation Session: 20220830102827
HighPow Impedance: 41 Ohm
Implantable Lead Implant Date: 20120511
Implantable Lead Location: 753860
Implantable Lead Model: 7120
Implantable Pulse Generator Implant Date: 20120511
Lead Channel Impedance Value: 580 Ohm
Lead Channel Pacing Threshold Amplitude: 0.75 V
Lead Channel Pacing Threshold Pulse Width: 0.5 ms
Lead Channel Sensing Intrinsic Amplitude: 11 mV
Lead Channel Setting Pacing Amplitude: 2.5 V
Lead Channel Setting Pacing Pulse Width: 0.5 ms
Lead Channel Setting Sensing Sensitivity: 0.5 mV
Pulse Gen Serial Number: 634653

## 2021-06-25 NOTE — Telephone Encounter (Signed)
The patient called to get help with her monitor. I called tech support to get additional help. The [employer] rep states [employer] cell towers are having issues right now. She is going to follow up with the patient by this time tomorrow. She might have to order the patient a part.

## 2021-06-26 DIAGNOSIS — N183 Chronic kidney disease, stage 3 unspecified: Secondary | ICD-10-CM | POA: Diagnosis not present

## 2021-06-26 DIAGNOSIS — I5022 Chronic systolic (congestive) heart failure: Secondary | ICD-10-CM | POA: Diagnosis not present

## 2021-06-26 DIAGNOSIS — I130 Hypertensive heart and chronic kidney disease with heart failure and stage 1 through stage 4 chronic kidney disease, or unspecified chronic kidney disease: Secondary | ICD-10-CM | POA: Diagnosis not present

## 2021-06-27 ENCOUNTER — Other Ambulatory Visit (HOSPITAL_COMMUNITY): Payer: Self-pay | Admitting: Cardiology

## 2021-06-27 DIAGNOSIS — I5022 Chronic systolic (congestive) heart failure: Secondary | ICD-10-CM

## 2021-07-03 ENCOUNTER — Encounter (HOSPITAL_COMMUNITY): Payer: Medicare Other | Admitting: Cardiology

## 2021-07-05 ENCOUNTER — Other Ambulatory Visit (HOSPITAL_COMMUNITY): Payer: Self-pay

## 2021-07-05 MED ORDER — DAPAGLIFLOZIN PROPANEDIOL 10 MG PO TABS
10.0000 mg | ORAL_TABLET | Freq: Every day | ORAL | 2 refills | Status: DC
Start: 2021-07-05 — End: 2021-11-13

## 2021-07-05 NOTE — Progress Notes (Signed)
Remote ICD transmission.   

## 2021-07-08 ENCOUNTER — Ambulatory Visit (INDEPENDENT_AMBULATORY_CARE_PROVIDER_SITE_OTHER): Payer: Medicare Other

## 2021-07-08 DIAGNOSIS — I5022 Chronic systolic (congestive) heart failure: Secondary | ICD-10-CM

## 2021-07-08 DIAGNOSIS — Z95810 Presence of automatic (implantable) cardiac defibrillator: Secondary | ICD-10-CM

## 2021-07-10 ENCOUNTER — Telehealth: Payer: Self-pay

## 2021-07-10 NOTE — Progress Notes (Signed)
EPIC Encounter for ICM Monitoring  Patient Name: Anita Warren is a 60 y.o. female Date: 07/10/2021 Primary Care Physican: Redmond School, MD Primary Cardiologist: [name] Electrophysiologist: Allred 06/05/2021 Weight: 178 lbs                                                             Spoke with patient and heart failure questions reviewed.  Pt asymptomatic for fluid accumulation and feeling well.  No fluid symptoms during decreased impedance.    Coruve thoracic impedance suggesting suggesting possible fluid accumulation from 9/3-9/11.     Prescribed:  Furosemide 20 mg take 1 tablet (20 mg total) daily.   Potassium 10 mEq take 1 tablet daily. Spironolactone 50 mg take 1 tablet daily     Labs: 10/08/2020 Creatinine 1.39, BUN 21, Potassium 3.9, Sodium 135, GFR 41-48 06/05/2020 Creatinine 1.38, BUN 24, Potassium 3.9, Sodium 138, GFR 42-49 05/22/2020 Creatinine 1.32, BUN 21, Potassium 4.0, Sodium 138, GFR 44-51 A complete set of results can be found in Results Review.   Recommendations:  No changes and encouraged to call if experiencing any fluid symptoms.   Follow-up plan: ICM clinic phone appointment on 08/12/2021.   91 day device clinic remote transmission 08/27/2021.      EP/Cardiology Office Visits:  08/02/2021 with Dr Rayann Heman.  Appointment due with Dr [name] 12/2020 (no recall)   Copy of ICM check sent to Dr. Rayann Heman.    3 month ICM trend: 07/10/2021.    1 Year ICM trend:       Rosalene Billings, RN 07/10/2021 4:40 PM

## 2021-07-10 NOTE — Telephone Encounter (Signed)
I spoke with the patient and she agreed to send missed ICM transmission. ?

## 2021-07-12 ENCOUNTER — Other Ambulatory Visit (HOSPITAL_COMMUNITY): Payer: Self-pay

## 2021-07-12 ENCOUNTER — Other Ambulatory Visit: Payer: Self-pay | Admitting: Cardiology

## 2021-07-29 ENCOUNTER — Other Ambulatory Visit (HOSPITAL_COMMUNITY): Payer: Self-pay | Admitting: Cardiology

## 2021-07-29 DIAGNOSIS — I5022 Chronic systolic (congestive) heart failure: Secondary | ICD-10-CM

## 2021-07-30 ENCOUNTER — Other Ambulatory Visit (HOSPITAL_COMMUNITY): Payer: Self-pay | Admitting: Cardiology

## 2021-08-02 ENCOUNTER — Encounter: Payer: Self-pay | Admitting: Internal Medicine

## 2021-08-02 ENCOUNTER — Ambulatory Visit: Payer: Medicare Other | Admitting: Internal Medicine

## 2021-08-02 VITALS — BP 120/82 | HR 70 | Ht 64.0 in | Wt 177.6 lb

## 2021-08-02 DIAGNOSIS — I4892 Unspecified atrial flutter: Secondary | ICD-10-CM

## 2021-08-02 DIAGNOSIS — I428 Other cardiomyopathies: Secondary | ICD-10-CM | POA: Diagnosis not present

## 2021-08-02 DIAGNOSIS — I10 Essential (primary) hypertension: Secondary | ICD-10-CM

## 2021-08-02 DIAGNOSIS — I5021 Acute systolic (congestive) heart failure: Secondary | ICD-10-CM

## 2021-08-02 DIAGNOSIS — I5022 Chronic systolic (congestive) heart failure: Secondary | ICD-10-CM | POA: Diagnosis not present

## 2021-08-02 LAB — CUP PACEART INCLINIC DEVICE CHECK
Battery Remaining Longevity: 18 mo
Brady Statistic RV Percent Paced: 0.01 %
Date Time Interrogation Session: 20221007084056
HighPow Impedance: 39.2516
Implantable Lead Implant Date: 20120511
Implantable Lead Location: 753860
Implantable Lead Model: 7120
Implantable Pulse Generator Implant Date: 20120511
Lead Channel Impedance Value: 512.5 Ohm
Lead Channel Pacing Threshold Amplitude: 0.75 V
Lead Channel Pacing Threshold Amplitude: 0.75 V
Lead Channel Pacing Threshold Pulse Width: 0.5 ms
Lead Channel Pacing Threshold Pulse Width: 0.5 ms
Lead Channel Sensing Intrinsic Amplitude: 11.7 mV
Lead Channel Setting Pacing Amplitude: 2.5 V
Lead Channel Setting Pacing Pulse Width: 0.5 ms
Lead Channel Setting Sensing Sensitivity: 0.5 mV
Pulse Gen Serial Number: 634653

## 2021-08-02 NOTE — Progress Notes (Signed)
PCP: Redmond School, MD Primary Cardiologist: Dr [name] Primary EP: Dr Rayann Heman  Anita Warren is a 60 y.o. female who presents today for routine electrophysiology followup.  Since last being seen in our clinic, the patient reports doing very well.  Today, she denies symptoms of palpitations, chest pain, shortness of breath,  lower extremity edema, dizziness, presyncope, syncope, or ICD shocks.  The patient is otherwise without complaint today.   Past Medical History:  Diagnosis Date   AICD (automatic cardioverter/defibrillator) present    St. Jude AICD implanted 03-07-2011 Dr. Matyas Baisley/-Dr. [name] now follows   CHF (congestive heart failure) ([place])    meds controlling, no episodes since 8675   Chronic systolic heart failure ([place])    Essential hypertension, benign    Headache    History of kidney stones    multiple kidney stones in past   History of medication noncompliance    Hydronephrosis with renal and ureteral calculus obstruction 09/05/2013   Nonischemic cardiomyopathy ([place])    LVEF 5-10%, likely viral (no CAD by cath 01/30/11)   NSVT (nonsustained ventricular tachycardia)    Obesity    Past Surgical History:  Procedure Laterality Date   ABDOMINAL HYSTERECTOMY     BREAST BIOPSY Left 10/19/2019   Procedure: BREAST BIOPSY WITH NEEDLE LOCALIZATION;  Surgeon: Aviva Signs, MD;  Location: [facility];  Service: General;  Laterality: Left;   BREAST LUMPECTOMY  1989   L breast- benign   CARDIAC CATHETERIZATION     CARDIAC DEFIBRILLATOR PLACEMENT  5/12   SJM by JA   CESAREAN SECTION     x 2   CHOLECYSTECTOMY     COLONOSCOPY  07/02/2012   Procedure: COLONOSCOPY;  Surgeon: [name], MD;  Location: [facility]ITE;  Service: Endoscopy;  Laterality: N/A;  1:15/PATIENT HAS A DEFIBRILLATOR   CYSTOSCOPY W/ URETERAL STENT PLACEMENT Right 09/06/2013   Procedure: CYSTOSCOPY WITH RIGHT RETROGRADE PYELOGRAM; RIGHT URETERAL STENT PLACEMENT;  Surgeon: [name], MD;  Location: [facility]  ORS;  Service: Urology;  Laterality: Right;   CYSTOSCOPY W/ URETERAL STENT PLACEMENT N/A 04/28/2016   Procedure: CYSTOSCOPY WITH  RIGHT RETROGRADE PYELOGRAM/RIGHT URETERAL STENT PLACEMENT;  Surgeon: Rana Snare, MD;  Location: [facility];  Service: Urology;  Laterality: N/A;   CYSTOSCOPY W/ URETERAL STENT REMOVAL Right 07/21/2016   Procedure: CYSTOSCOPY WITH STENT REMOVAL;  Surgeon: Franchot Gallo, MD;  Location: [facility];  Service: Urology;  Laterality: Right;   CYSTOSCOPY/URETEROSCOPY/HOLMIUM LASER/STENT PLACEMENT Right 07/21/2016   Procedure: CYSTOSCOPY/URETEROSCOPY/HOLMIUM LASER/   right retrograde pylegram;  Surgeon: Franchot Gallo, MD;  Location: [facility];  Service: Urology;  Laterality: Right;   endovenous laser ablation and stab phlebectomies Right 08/01/2020   EVLA of right greater saphenous vein and > 20 stab phlebectomies   TONSILLECTOMY     TUBAL LIGATION      ROS- all systems are reviewed and negative except as per HPI above  Current Outpatient Medications  Medication Sig Dispense Refill   acetaminophen (TYLENOL) 500 MG tablet Take 1,000 mg by mouth every 6 (six) hours as needed for headache.      carvedilol (COREG) 25 MG tablet TAKE 1 TABLET BY MOUTH TWICE DAILY WITH A MEAL 180 tablet 0   Coenzyme Q10 (CO Q 10) 100 MG CAPS Take 100 mg by mouth daily.      dapagliflozin propanediol (FARXIGA) 10 MG TABS tablet Take 1 tablet (10 mg total) by mouth daily before breakfast. 30 tablet 2   diphenhydrAMINE (BENADRYL) 25 MG tablet Take  25 mg by mouth every 6 (six) hours as needed (headache).      ELIQUIS 5 MG TABS tablet Take 1 tablet by mouth twice daily 60 tablet 0   furosemide (LASIX) 20 MG tablet Take 1 tablet by mouth once daily 90 tablet 1   hydrALAZINE (APRESOLINE) 100 MG tablet TAKE 1 TABLET BY MOUTH THREE TIMES DAILY 90 tablet 0   isosorbide mononitrate (IMDUR) 30 MG 24 hr tablet Take 3 tablets (90 mg total) by mouth daily. Needs appt for further refills 270 tablet 0   potassium  chloride (KLOR-CON) 10 MEQ tablet Take 1 tablet by mouth once daily 30 tablet 0   spironolactone (ALDACTONE) 50 MG tablet Take 1 tablet (50 mg total) by mouth daily. Needs appt for further refills 90 tablet 0   No current facility-administered medications for this visit.    Physical Exam: Vitals:   08/02/21 0822  BP: 120/82  Pulse: 70  SpO2: 99%  Weight: 177 lb 9.6 oz (80.6 kg)  Height: 5\' 4"  (1.626 m)    GEN- The patient is well appearing, alert and oriented x 3 today.   Head- normocephalic, atraumatic Eyes-  Sclera clear, conjunctiva pink Ears- hearing intact Oropharynx- clear Lungs- Clear to ausculation bilaterally, normal work of breathing Chest- ICD pocket is well healed Heart- Regular rate and rhythm, no murmurs, rubs or gallops, PMI not laterally displaced GI- soft, NT, ND, + BS Extremities- no clubbing, cyanosis, or edema  ICD interrogation- reviewed in detail today,  See PACEART report  ekg tracing ordered today is personally reviewed and shows sinus rhythm, narrow QRS  Wt Readings from Last 3 Encounters:  08/02/21 177 lb 9.6 oz (80.6 kg)  09/25/20 187 lb 3.2 oz (84.9 kg)  08/15/20 182 lb (82.6 kg)    Assessment and Plan:  1.  Chronic systolic dysfunction/ nonischemic CM euvolemic today Stable on an appropriate medical regimen Normal ICD function See [facility] report No changes today she is not device dependant today followed in ICM device clinic  2. Remote atypical atrial flutter No arrhythmias in past 2 years off AAD therapy Chads2vasc score is 3.  She is on eliquis  3. HTN Stable No change required today  Return in a year  Thompson Grayer MD, [facility] 08/02/2021 8:42 AM

## 2021-08-02 NOTE — Patient Instructions (Signed)
Medication Instructions:  Continue all current medications.  Labwork: none  Testing/Procedures: none  Follow-Up: 1 year   Any Other Special Instructions Will Be Listed Below (If Applicable).  If you need a refill on your cardiac medications before your next appointment, please call your pharmacy.  

## 2021-08-12 ENCOUNTER — Ambulatory Visit (INDEPENDENT_AMBULATORY_CARE_PROVIDER_SITE_OTHER): Payer: Medicare Other

## 2021-08-12 DIAGNOSIS — Z95810 Presence of automatic (implantable) cardiac defibrillator: Secondary | ICD-10-CM

## 2021-08-12 DIAGNOSIS — I5022 Chronic systolic (congestive) heart failure: Secondary | ICD-10-CM

## 2021-08-14 ENCOUNTER — Other Ambulatory Visit (HOSPITAL_COMMUNITY): Payer: Self-pay | Admitting: Internal Medicine

## 2021-08-14 DIAGNOSIS — Z1231 Encounter for screening mammogram for malignant neoplasm of breast: Secondary | ICD-10-CM

## 2021-08-14 NOTE — Progress Notes (Signed)
EPIC Encounter for ICM Monitoring  Patient Name: Anita Warren is a 60 y.o. female Date: 08/14/2021 Primary Care Physican: Redmond School, MD Primary Cardiologist: [name] Electrophysiologist: Allred 08/02/2021 Weight: 177 lbs                                                             Spoke with patient and heart failure questions reviewed.  Pt asymptomatic for fluid accumulation and feeling well.   Her skin has been dry and has increased fluid intake for past couple of days to help with hydration.   Coruve thoracic impedance suggesting possible dryness starting 10/13 but was suggesting possible fluid accumulation from 9/29-10/10.     Prescribed:  Furosemide 20 mg take 1 tablet (20 mg total) daily.   Potassium 10 mEq take 1 tablet daily. Spironolactone 50 mg take 1 tablet daily     Labs: 10/08/2020 Creatinine 1.39, BUN 21, Potassium 3.9, Sodium 135, GFR 41-48 06/05/2020 Creatinine 1.38, BUN 24, Potassium 3.9, Sodium 138, GFR 42-49 05/22/2020 Creatinine 1.32, BUN 21, Potassium 4.0, Sodium 138, GFR 44-51 A complete set of results can be found in Results Review.   Recommendations:  No changes and encouraged to call if experiencing any fluid symptoms.   Follow-up plan: ICM clinic phone appointment on 09/24/2021.   91 day device clinic remote transmission 09/23/2021.      EP/Cardiology Office Visits:  08/27/2021 with Dr [name]   Copy of ICM check sent to Dr. Rayann Heman.     3 month ICM trend: 08/12/2021.    1 Year ICM trend:       Rosalene Billings, RN 08/14/2021 12:30 PM

## 2021-08-17 ENCOUNTER — Other Ambulatory Visit (HOSPITAL_COMMUNITY): Payer: Self-pay | Admitting: Cardiology

## 2021-08-27 ENCOUNTER — Other Ambulatory Visit: Payer: Self-pay

## 2021-08-27 ENCOUNTER — Encounter (HOSPITAL_COMMUNITY): Payer: Self-pay | Admitting: Cardiology

## 2021-08-27 ENCOUNTER — Ambulatory Visit (HOSPITAL_COMMUNITY)
Admission: RE | Admit: 2021-08-27 | Discharge: 2021-08-27 | Disposition: A | Discharge status: Home / Self Care | Payer: Medicare Other | Source: Ambulatory Visit | Attending: Cardiology | Admitting: Cardiology

## 2021-08-27 VITALS — BP 130/80 | HR 55 | Wt 173.2 lb

## 2021-08-27 DIAGNOSIS — N183 Chronic kidney disease, stage 3 unspecified: Secondary | ICD-10-CM | POA: Insufficient documentation

## 2021-08-27 DIAGNOSIS — I130 Hypertensive heart and chronic kidney disease with heart failure and stage 1 through stage 4 chronic kidney disease, or unspecified chronic kidney disease: Secondary | ICD-10-CM | POA: Insufficient documentation

## 2021-08-27 DIAGNOSIS — Z79899 Other long term (current) drug therapy: Secondary | ICD-10-CM | POA: Insufficient documentation

## 2021-08-27 DIAGNOSIS — Z7901 Long term (current) use of anticoagulants: Secondary | ICD-10-CM | POA: Insufficient documentation

## 2021-08-27 DIAGNOSIS — I428 Other cardiomyopathies: Secondary | ICD-10-CM | POA: Insufficient documentation

## 2021-08-27 DIAGNOSIS — R0600 Dyspnea, unspecified: Secondary | ICD-10-CM | POA: Insufficient documentation

## 2021-08-27 DIAGNOSIS — Z8249 Family history of ischemic heart disease and other diseases of the circulatory system: Secondary | ICD-10-CM | POA: Diagnosis not present

## 2021-08-27 DIAGNOSIS — I5022 Chronic systolic (congestive) heart failure: Secondary | ICD-10-CM | POA: Insufficient documentation

## 2021-08-27 DIAGNOSIS — Z7984 Long term (current) use of oral hypoglycemic drugs: Secondary | ICD-10-CM | POA: Diagnosis not present

## 2021-08-27 DIAGNOSIS — I4892 Unspecified atrial flutter: Secondary | ICD-10-CM | POA: Diagnosis not present

## 2021-08-27 DIAGNOSIS — Z95810 Presence of automatic (implantable) cardiac defibrillator: Secondary | ICD-10-CM | POA: Diagnosis not present

## 2021-08-27 LAB — BASIC METABOLIC PANEL
Anion gap: 7 (ref 5–15)
BUN: 24 mg/dL — ABNORMAL HIGH (ref 6–20)
CO2: 24 mmol/L (ref 22–32)
Calcium: 9.3 mg/dL (ref 8.9–10.3)
Chloride: 106 mmol/L (ref 98–111)
Creatinine, Ser: 1.56 mg/dL — ABNORMAL HIGH (ref 0.44–1.00)
GFR, Estimated: 38 mL/min — ABNORMAL LOW (ref >60)
Glucose, Bld: 111 mg/dL — ABNORMAL HIGH (ref 70–99)
Potassium: 3.7 mmol/L (ref 3.5–5.1)
Sodium: 137 mmol/L (ref 135–145)

## 2021-08-27 LAB — CBC
HCT: 34.1 % — ABNORMAL LOW (ref 36.0–46.0)
Hemoglobin: 10.4 g/dL — ABNORMAL LOW (ref 12.0–15.0)
MCH: 28 pg (ref 26.0–34.0)
MCHC: 30.5 g/dL (ref 30.0–36.0)
MCV: 91.9 fL (ref 80.0–100.0)
Platelets: 204 10*3/uL (ref 150–400)
RBC: 3.71 MIL/uL — ABNORMAL LOW (ref 3.87–5.11)
RDW: 17.4 % — ABNORMAL HIGH (ref 11.5–15.5)
WBC: 6.1 10*3/uL (ref 4.0–10.5)
nRBC: 0 % (ref 0.0–0.2)

## 2021-08-27 LAB — BRAIN NATRIURETIC PEPTIDE: B Natriuretic Peptide: 29.2 pg/mL (ref 0.0–100.0)

## 2021-08-27 NOTE — Progress Notes (Signed)
Patient ID: HEAVEN MEEKER, female   DOB: 26-Nov-1960, 60 y.o.   MRN: [id number]     Advanced Heart Failure Clinic Note   Primary Physician: Glo Herring., MD  HF Cardiology: Dr. [name] EP: Dr Allred  Urology: Dr Risa Grill  HPI: MAEGAN BULLER is a 60 y.o. female with history of viral, non-ischemic cardiomyopathy, severe systolic dysfunction with EF of 10% s/p ICD pacemaker in the setting of VT 02/2011 (St. Jude);  cath 25-Dec-2010 with no CAD.   Admitted 7/15 with acute on chronic systolic CHF she was not taking medications for 4 months because she lost her insurance. She was diuresed with IV lasix and transitioned to lasix 40 mg daily. She was also discharged on 12.5 mg carvedilol twice a day, hydralazine 25 mg tid, and 30 mg Imdur daily. Discharge weight was 176 pounds. She was not on an ACEI due to angioedema.  Admitted from HF clinic 06/02/14 with low output heart failure. Swan placed and showed cardiogenic shock. Ultimately discharged on  Milrinone 0.25 mcg/kg/min. Hospital stay was complicated by atrial flutter. Loaded on amiodarone and started on eliquis 5 mg twice a day. Met with Dr Prescott Gum and VAD coordinator. She was titrated off the milrinone.  Echo in 8/17 showed EF up to 50%, echo in 8/18 showed EF 45%.  Echo in 11/19 showed EF 35-40%. Echo in 11/21 showed EF stable 40% with normal RV.   She presents for followup of CHF.  She has lost 14 lbs since last appointment.  She is feeling good overall.  Works out most days on elliptical.  Occasional dyspnea with steep hills when out on walks but otherwise no significant dyspnea.  No chest pain.  No lightheadedness.  No orthopnea/PND.      ECG (personally reviewed): NSR, nonspecific T wave flattening.   St Jude device interrogation: stable thoracic impedance (trending up)  05/23/14 ECHO EF 5-10% RV mod to severely dilated.  08/09/14 ECHO EF 20-25% RV read as normal 8/17 ECHO EF 50%, mild LV dilation, normal RV size and systolic  function.  8/18 ECHO EF 45%, diffuse HK worse inferiorly, normal RV size and systolic function.  11/19 ECHO EF 35-40%, mild LV dilation with diffuse hypokinesis, normal RV size with mildly decreased systolic function.  11/21 ECHO EF 40%, hypokinesis of the basal to mid septum and the basal inferior wall, RV normal, small pericardial effusion.   Labs:  HIV negative 12-25-13  9/15: K 3.6 => 4.6, creatinine 0.99 => 1.5 => 1.2, Mg 1.9, HCT 45.2, LFTs normal, TSH normal 9/15: PFTs normal 07/31/14: Co-ox off milrinone 66%  K 4.5 Cr 1.36 08/23/14: k 4.4, cr 1.51 digoxin 0.7 09/20/14: K 4.9, creatinine 1.64  12/15: K 4.4, creatinine 1.36 2/16: K 4.2, creatinine 1.67, HCT 33.9 04/28/2016: K 4.1 Creatinine 2.67  8/17: TSH normal, LFTs normal 9/17: K 3.5, creatinine 1.67, hgb 9.9 5/18: hgb 10.7, K 3.8, creatinine 1.45 9/18: K 4.4, creatinine 1.53 4/19: K 3.8, creatinine 1.32, hgb 10.5 6/21: K 4.3, creatinine 1.34, LFTs normal 7/21: hgb 10.4 8/21: K 3.9, creatinine 1.38 11/21: K 3.8, creatinine 1.36  Dec 26, 2011 Colonoscopy Blood Type:  B+  Lower Extremity Doppler - Normal arterial flow Carotid Doppler-  1-39% stenosis involving the right internal carotid artery and the left internal carotid Artery.  SH: Disabled. Has 3 grown children. Does not smoke or drink alcohol.  Not married (fiance passed away in 12-26-19). 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    FH: Mom died at 58 CAD  Sister and Brother HTN   Review of systems complete and found to be negative unless listed in HPI.    Past Medical History:  Diagnosis Date   AICD (automatic cardioverter/defibrillator) present    St. Jude AICD implanted 03-07-2011 Dr. Allred/-Dr. [name] now follows   CHF (congestive heart failure) ([place])    meds controlling, no episodes since 9476   Chronic systolic heart failure ([place])    Essential hypertension, benign    Headache    History of kidney stones    multiple kidney stones in past   History of medication  noncompliance    Hydronephrosis with renal and ureteral calculus obstruction 09/05/2013   Nonischemic cardiomyopathy ([place])    LVEF 5-10%, likely viral (no CAD by cath 01/30/11)   NSVT (nonsustained ventricular tachycardia)    Obesity     Current Outpatient Medications  Medication Sig Dispense Refill   acetaminophen (TYLENOL) 500 MG tablet Take 1,000 mg by mouth every 6 (six) hours as needed for headache.      carvedilol (COREG) 25 MG tablet TAKE 1 TABLET BY MOUTH TWICE DAILY WITH A MEAL 180 tablet 0   Coenzyme Q10 (CO Q 10) 100 MG CAPS Take 100 mg by mouth daily.      dapagliflozin propanediol (FARXIGA) 10 MG TABS tablet Take 1 tablet (10 mg total) by mouth daily before breakfast. 30 tablet 2   diphenhydrAMINE (BENADRYL) 25 MG tablet Take 25 mg by mouth every 6 (six) hours as needed (headache).      ELIQUIS 5 MG TABS tablet Take 1 tablet by mouth twice daily 60 tablet 0   furosemide (LASIX) 20 MG tablet Take 1 tablet by mouth once daily 90 tablet 1   hydrALAZINE (APRESOLINE) 100 MG tablet TAKE 1 TABLET BY MOUTH THREE TIMES DAILY 90 tablet 0   isosorbide mononitrate (IMDUR) 30 MG 24 hr tablet Take 3 tablets (90 mg total) by mouth daily. Needs appt for further refills 270 tablet 0   potassium chloride (KLOR-CON) 10 MEQ tablet Take 1 tablet by mouth once daily 30 tablet 0   spironolactone (ALDACTONE) 50 MG tablet TAKE 1 TABLET BY MOUTH ONCE DAILY . APPOINTMENT REQUIRED FOR FUTURE REFILLS 30 tablet 0   No current facility-administered medications for this encounter.   PHYSICAL EXAM: Vitals:   08/27/21 0842  BP: 130/80  Pulse: (!) 55  SpO2: 98%  Weight: 78.6 kg (173 lb 3.2 oz)    Wt Readings from Last 3 Encounters:  08/27/21 78.6 kg (173 lb 3.2 oz)  08/02/21 80.6 kg (177 lb 9.6 oz)  09/25/20 84.9 kg (187 lb 3.2 oz)    General: NAD Neck: No JVD, no thyromegaly or thyroid nodule.  Lungs: Clear to auscultation bilaterally with normal respiratory effort. CV: Nondisplaced PMI.  Heart  regular S1/S2, no S3/S4, no murmur.  No peripheral edema.  No carotid bruit.  Normal pedal pulses.  Abdomen: Soft, nontender, no hepatosplenomegaly, no distention.  Skin: Intact without lesions or rashes.  Neurologic: Alert and oriented x 3.  Psych: Normal affect. Extremities: No clubbing or cyanosis.  HEENT: Normal.   ASSESSMENT & PLAN: 1. Chronic Systolic Heart Failure: Nonischemic cardiomyopathy thought to potentially be due to viral myocarditis noted initially in 2011. Cath 2012 normal coronaries.  St Jude ICD. Echo (7/15) with EF 5-10% and RV mod-severely dilated with moderately decreased systolic function. Repeat echo 10/15 with EF up to 20-25%. She was on milrinone briefly then stopped.  Echo in 8/17 with EF up to 50%. Repeat  echo 11/19 showed EF back down to 35-40% with normal RV. Echo in 11/21 showed stable EF 40% and septal + inferior wall motion abnormalities.  NYHA class I-II, she is not volume overloaded by exam or by Corvue. Weight is down.   - Continue Lasix 20 mg daily, BMET today.   - Continue spironolactone 50 mg daily. Should get BMET every 3 months.    - No ACEI/ARB/ARNI due to recurrent episodes of angioedema with ACEI and also ARB.  - Continue Coreg 25 mg bid.   - Continue hydralazine 100 mg tid and Imdur 90 mg daily.   - I will arrange for repeat echo.  2. Atrial flutter: Paroxysmal, NSR today.  - She is now off amiodarone.  If atrial flutter recurs, should have ablation.  - Continue Eliquis 5 mg BID. CBC today.  3. Angioedema: Had in past on ACEI, had another episode after ACEI stopped.  She is no longer on ACEI, ARNI, or ARB. ?Hereditary or acquired C1 inhibitor deficiency. No further problems with this. 4. CKD: Stage 3 - BMET today.    Followup in 6 months.   Loralie Champagne, MD  08/27/2021

## 2021-08-27 NOTE — Patient Instructions (Addendum)
EKG done today.  Labs done today. We will contact you only if your labs are abnormal.  No medication changes were made. Please continue all current medications as prescribed.  Your physician recommends that you schedule a follow-up appointment soon for an echo, 3 months for a lab only(feb. 1st) appointment and in 6 months with Dr. [name]  If you have any questions or concerns before your next appointment please send [place] a message through [facility] or call our office at [phone].    TO LEAVE A MESSAGE FOR THE NURSE SELECT OPTION 2, PLEASE LEAVE A MESSAGE INCLUDING: YOUR NAME DATE OF BIRTH CALL BACK NUMBER REASON FOR CALL**this is important as we prioritize the call backs  YOU WILL RECEIVE A CALL BACK THE SAME DAY AS LONG AS YOU CALL BEFORE 4:00 PM   Do the following things EVERYDAY: Weigh yourself in the morning before breakfast. Write it down and keep it in a log. Take your medicines as prescribed Eat low salt foods--Limit salt (sodium) to 2000 mg per day.  Stay as active as you can everyday Limit all fluids for the day to less than 2 liters   At the [facility], you and your health needs are our priority. As part of our continuing mission to provide you with exceptional heart care, we have created designated Provider Care Teams. These Care Teams include your primary Cardiologist (physician) and Advanced Practice Providers (APPs- Physician Assistants and Nurse Practitioners) who all work together to provide you with the care you need, when you need it.   You may see any of the following providers on your designated Care Team at your next follow up: Dr Glori Bickers Dr Haynes Kerns, NP Lyda Jester, [place] Audry Riles, PharmD   Please be sure to bring in all your medications bottles to every appointment.

## 2021-08-29 ENCOUNTER — Other Ambulatory Visit (HOSPITAL_COMMUNITY): Payer: Self-pay | Admitting: Cardiology

## 2021-08-29 DIAGNOSIS — I5022 Chronic systolic (congestive) heart failure: Secondary | ICD-10-CM

## 2021-09-09 ENCOUNTER — Ambulatory Visit (HOSPITAL_COMMUNITY): Payer: Medicare Other

## 2021-09-17 ENCOUNTER — Other Ambulatory Visit (HOSPITAL_COMMUNITY): Payer: Self-pay | Admitting: Cardiology

## 2021-09-18 ENCOUNTER — Other Ambulatory Visit (HOSPITAL_COMMUNITY): Payer: Self-pay | Admitting: Cardiology

## 2021-09-18 ENCOUNTER — Other Ambulatory Visit: Payer: Self-pay

## 2021-09-18 ENCOUNTER — Ambulatory Visit (HOSPITAL_COMMUNITY)
Admission: RE | Admit: 2021-09-18 | Discharge: 2021-09-18 | Disposition: A | Discharge status: Home / Self Care | Payer: Medicare Other | Source: Ambulatory Visit | Attending: Internal Medicine | Admitting: Internal Medicine

## 2021-09-18 DIAGNOSIS — Z1231 Encounter for screening mammogram for malignant neoplasm of breast: Secondary | ICD-10-CM | POA: Diagnosis not present

## 2021-09-23 ENCOUNTER — Ambulatory Visit (INDEPENDENT_AMBULATORY_CARE_PROVIDER_SITE_OTHER): Payer: Medicare Other

## 2021-09-23 DIAGNOSIS — I428 Other cardiomyopathies: Secondary | ICD-10-CM

## 2021-09-24 ENCOUNTER — Ambulatory Visit (INDEPENDENT_AMBULATORY_CARE_PROVIDER_SITE_OTHER): Payer: Medicare Other

## 2021-09-24 DIAGNOSIS — Z95810 Presence of automatic (implantable) cardiac defibrillator: Secondary | ICD-10-CM

## 2021-09-24 DIAGNOSIS — I5022 Chronic systolic (congestive) heart failure: Secondary | ICD-10-CM

## 2021-09-24 LAB — CUP PACEART REMOTE DEVICE CHECK
Battery Remaining Longevity: 16 mo
Battery Remaining Percentage: 13 %
Battery Voltage: 2.71 V
Brady Statistic RV Percent Paced: 1 %
Date Time Interrogation Session: 20221128135450
HighPow Impedance: 38 Ohm
Implantable Lead Implant Date: 20120511
Implantable Lead Location: 753860
Implantable Lead Model: 7120
Implantable Pulse Generator Implant Date: 20120511
Lead Channel Impedance Value: 450 Ohm
Lead Channel Pacing Threshold Amplitude: 0.75 V
Lead Channel Pacing Threshold Pulse Width: 0.5 ms
Lead Channel Sensing Intrinsic Amplitude: 10.6 mV
Lead Channel Setting Pacing Amplitude: 2.5 V
Lead Channel Setting Pacing Pulse Width: 0.5 ms
Lead Channel Setting Sensing Sensitivity: 0.5 mV
Pulse Gen Serial Number: 634653

## 2021-09-25 DIAGNOSIS — N183 Chronic kidney disease, stage 3 unspecified: Secondary | ICD-10-CM | POA: Diagnosis not present

## 2021-09-25 DIAGNOSIS — I130 Hypertensive heart and chronic kidney disease with heart failure and stage 1 through stage 4 chronic kidney disease, or unspecified chronic kidney disease: Secondary | ICD-10-CM | POA: Diagnosis not present

## 2021-09-25 DIAGNOSIS — I5022 Chronic systolic (congestive) heart failure: Secondary | ICD-10-CM | POA: Diagnosis not present

## 2021-09-26 ENCOUNTER — Other Ambulatory Visit (HOSPITAL_COMMUNITY): Payer: Self-pay | Admitting: Cardiology

## 2021-09-27 NOTE — Progress Notes (Signed)
EPIC Encounter for ICM Monitoring  Patient Name: Anita Warren is a 60 y.o. female Date: 09/27/2021 Primary Care Physican: Redmond School, MD Primary Cardiologist: [name] Electrophysiologist: Allred 08/02/2021 Weight: 177 lbs                                                             Spoke with patient and heart failure questions reviewed.  Pt asymptomatic for fluid accumulation and feeling well.   Coruve thoracic impedance normal but was suggesting possible fluid accumulation from 11/6-11/10.     Prescribed:  Furosemide 20 mg take 1 tablet (20 mg total) daily.   Potassium 10 mEq take 1 tablet daily. Spironolactone 50 mg take 1 tablet daily     Labs: 08/27/2021 Creatinine 1.56, BUN 24, Potassium 3.7, Sodium 137, GFR 38 A complete set of results can be found in Results Review.   Recommendations:  No changes and encouraged to call if experiencing any fluid symptoms.   Follow-up plan: ICM clinic phone appointment on 11/04/2021.   91 day device clinic remote transmission 12/23/2021.      EP/Cardiology Office Visits:  08/01/2022 with Dr Rayann Heman   Copy of ICM check sent to Dr. Rayann Heman.     3 month ICM trend: 09/24/2021.    12-14 Month ICM trend:       Rosalene Billings, RN 09/27/2021 4:14 PM

## 2021-10-01 NOTE — Progress Notes (Signed)
Remote ICD transmission.   

## 2021-10-14 ENCOUNTER — Ambulatory Visit (HOSPITAL_COMMUNITY)
Admission: RE | Admit: 2021-10-14 | Discharge: 2021-10-14 | Disposition: A | Discharge status: Home / Self Care | Payer: Medicare Other | Source: Ambulatory Visit | Attending: Internal Medicine | Admitting: Internal Medicine

## 2021-10-14 ENCOUNTER — Other Ambulatory Visit: Payer: Self-pay

## 2021-10-14 ENCOUNTER — Telehealth (HOSPITAL_COMMUNITY): Payer: Self-pay | Admitting: Surgery

## 2021-10-14 DIAGNOSIS — I371 Nonrheumatic pulmonary valve insufficiency: Secondary | ICD-10-CM | POA: Diagnosis not present

## 2021-10-14 DIAGNOSIS — I110 Hypertensive heart disease with heart failure: Secondary | ICD-10-CM | POA: Diagnosis not present

## 2021-10-14 DIAGNOSIS — I428 Other cardiomyopathies: Secondary | ICD-10-CM | POA: Diagnosis not present

## 2021-10-14 DIAGNOSIS — I472 Ventricular tachycardia, unspecified: Secondary | ICD-10-CM | POA: Diagnosis not present

## 2021-10-14 DIAGNOSIS — I3139 Other pericardial effusion (noninflammatory): Secondary | ICD-10-CM | POA: Diagnosis not present

## 2021-10-14 DIAGNOSIS — I5022 Chronic systolic (congestive) heart failure: Secondary | ICD-10-CM | POA: Diagnosis not present

## 2021-10-14 LAB — ECHOCARDIOGRAM COMPLETE
Area-P 1/2: 3.19 cm2
S' Lateral: 5 cm

## 2021-10-14 NOTE — Telephone Encounter (Signed)
I called patient and reviewed results.  She was grateful for the call.

## 2021-10-14 NOTE — Telephone Encounter (Signed)
-----   Message from Larey Dresser, MD sent at 10/14/2021  1:54 PM EST ----- Stable echo, EF 35-40%

## 2021-10-14 NOTE — Progress Notes (Signed)
Echocardiogram 2D Echocardiogram has been performed.  Anita Warren M 10/14/2021, 9:25 AM

## 2021-11-04 ENCOUNTER — Ambulatory Visit (INDEPENDENT_AMBULATORY_CARE_PROVIDER_SITE_OTHER): Payer: Medicare Other

## 2021-11-04 DIAGNOSIS — I5022 Chronic systolic (congestive) heart failure: Secondary | ICD-10-CM

## 2021-11-04 DIAGNOSIS — Z95810 Presence of automatic (implantable) cardiac defibrillator: Secondary | ICD-10-CM

## 2021-11-06 NOTE — Progress Notes (Signed)
EPIC Encounter for ICM Monitoring  Patient Name: Anita Warren is a 61 y.o. female Date: 11/06/2021 Primary Care Physican: Redmond School, MD Primary Cardiologist: [name] Electrophysiologist: Allred 08/27/2021 Weight: 173 lbs                                                             Spoke with patient and heart failure questions reviewed.  Pt asymptomatic for fluid accumulation and feeling well.   Coruve thoracic impedance normal but was suggesting possible fluid accumulation from 12/26-12/29.     Prescribed:  Furosemide 20 mg take 1 tablet (20 mg total) daily.   Potassium 10 mEq take 1 tablet daily. Spironolactone 50 mg take 1 tablet daily     Labs: 08/27/2021 Creatinine 1.56, BUN 24, Potassium 3.7, Sodium 137, GFR 38 A complete set of results can be found in Results Review.   Recommendations:  No changes and encouraged to call if experiencing any fluid symptoms.   Follow-up plan: ICM clinic phone appointment on 12/09/2021.   91 day device clinic remote transmission 12/23/2021.      EP/Cardiology Office Visits:  08/01/2022 with Dr Rayann Heman   Copy of ICM check sent to Dr. Rayann Heman.     3 month ICM trend: 11/04/2021.    12-14 Month ICM trend:     Rosalene Billings, RN 11/06/2021 4:37 PM

## 2021-11-13 ENCOUNTER — Other Ambulatory Visit (HOSPITAL_COMMUNITY): Payer: Self-pay | Admitting: Cardiology

## 2021-12-09 ENCOUNTER — Ambulatory Visit (INDEPENDENT_AMBULATORY_CARE_PROVIDER_SITE_OTHER): Payer: Self-pay

## 2021-12-09 DIAGNOSIS — I5022 Chronic systolic (congestive) heart failure: Secondary | ICD-10-CM

## 2021-12-09 DIAGNOSIS — Z95810 Presence of automatic (implantable) cardiac defibrillator: Secondary | ICD-10-CM

## 2021-12-09 NOTE — Progress Notes (Signed)
EPIC Encounter for ICM Monitoring  Patient Name: Anita Warren is a 61 y.o. female Date: 12/09/2021 Primary Care Physican: Redmond School, MD Primary Cardiologist: [name] Electrophysiologist: Allred 12/09/2021 Weight: 173 lbs                                                             Spoke with patient and heart failure questions reviewed.  Pt asymptomatic for fluid accumulation and feeling well.  She at at [employer] and [employer] last couple of weekends which may contribute to decreased impedance.     Coruve thoracic impedance suggesting possible fluid accumulation starting 2/3, returned to baseline 2/10 and then restarted possible fluid accumulation 2/11.     Prescribed:  Furosemide 20 mg take 1 tablet (20 mg total) daily.   Potassium 10 mEq take 1 tablet daily. Spironolactone 50 mg take 1 tablet daily     Labs: 08/27/2021 Creatinine 1.56, BUN 24, Potassium 3.7, Sodium 137, GFR 38 A complete set of results can be found in Results Review.   Recommendations:  Advised to avoid restaurant foods if possible due to foods are typically very high in salt.  Encouraged to call if develops any fluid symptoms.   Follow-up plan: ICM clinic phone appointment on 12/17/2021 to recheck fluid levels.   91 day device clinic remote transmission 12/23/2021.      EP/Cardiology Office Visits:  08/01/2022 with Dr Rayann Heman   Copy of ICM check sent to Dr. Rayann Heman.     3 month ICM trend: 12/09/2021.    12-14 Month ICM trend:     Rosalene Billings, RN 12/09/2021 1:16 PM

## 2021-12-17 ENCOUNTER — Ambulatory Visit (INDEPENDENT_AMBULATORY_CARE_PROVIDER_SITE_OTHER): Payer: Medicare Other

## 2021-12-17 DIAGNOSIS — Z95810 Presence of automatic (implantable) cardiac defibrillator: Secondary | ICD-10-CM

## 2021-12-17 DIAGNOSIS — I5022 Chronic systolic (congestive) heart failure: Secondary | ICD-10-CM

## 2021-12-17 NOTE — Progress Notes (Signed)
EPIC Encounter for ICM Monitoring  Patient Name: Anita Warren is a 61 y.o. female Date: 12/17/2021 Primary Care Physican: Redmond School, MD Primary Cardiologist: [name] Electrophysiologist: Allred 12/09/2021 Weight: 173 lbs  12/17/2021 Weight: 170 lbs                                                            Spoke with patient and heart failure questions reviewed.  Pt asymptomatic for fluid accumulation and feeling well.    She stopped eating restaurant foods to help decrease salt intake.    Coruve thoracic impedance suggesting possible fluid accumulation starting 2/3, returned to baseline 2/10 and then restarted possible fluid accumulation 2/11.     Prescribed:  Furosemide 20 mg take 1 tablet (20 mg total) daily.   Potassium 10 mEq take 1 tablet daily. Spironolactone 50 mg take 1 tablet daily     Labs: 08/27/2021 Creatinine 1.56, BUN 24, Potassium 3.7, Sodium 137, GFR 38 A complete set of results can be found in Results Review.   Recommendations:   Encouraged to call if develops any fluid symptoms.   Follow-up plan: ICM clinic phone appointment on 01/13/2022.   91 day device clinic remote transmission 12/23/2021.      EP/Cardiology Office Visits:  08/01/2022 with Dr Rayann Heman   Copy of ICM check sent to Dr. Rayann Heman.     3 month ICM trend: 12/17/2021.    12-14 Month ICM trend:     Rosalene Billings, RN 12/17/2021 1:30 PM

## 2021-12-23 ENCOUNTER — Ambulatory Visit (INDEPENDENT_AMBULATORY_CARE_PROVIDER_SITE_OTHER): Payer: Medicare Other

## 2021-12-23 DIAGNOSIS — I429 Cardiomyopathy, unspecified: Secondary | ICD-10-CM | POA: Diagnosis not present

## 2021-12-24 LAB — CUP PACEART REMOTE DEVICE CHECK
Battery Remaining Longevity: 11 mo
Battery Remaining Percentage: 9 %
Battery Voltage: 2.66 V
Brady Statistic RV Percent Paced: 1 %
Date Time Interrogation Session: 20230228022522
HighPow Impedance: 39 Ohm
Implantable Lead Implant Date: 20120511
Implantable Lead Location: 753860
Implantable Lead Model: 7120
Implantable Pulse Generator Implant Date: 20120511
Lead Channel Impedance Value: 490 Ohm
Lead Channel Pacing Threshold Amplitude: 0.75 V
Lead Channel Pacing Threshold Pulse Width: 0.5 ms
Lead Channel Sensing Intrinsic Amplitude: 9.9 mV
Lead Channel Setting Pacing Amplitude: 2.5 V
Lead Channel Setting Pacing Pulse Width: 0.5 ms
Lead Channel Setting Sensing Sensitivity: 0.5 mV
Pulse Gen Serial Number: 634653

## 2021-12-26 NOTE — Progress Notes (Signed)
Remote ICD transmission.   

## 2022-01-07 ENCOUNTER — Other Ambulatory Visit: Payer: Self-pay | Admitting: Cardiology

## 2022-01-13 ENCOUNTER — Ambulatory Visit (INDEPENDENT_AMBULATORY_CARE_PROVIDER_SITE_OTHER): Payer: Medicare Other

## 2022-01-13 DIAGNOSIS — Z95810 Presence of automatic (implantable) cardiac defibrillator: Secondary | ICD-10-CM

## 2022-01-13 DIAGNOSIS — I5022 Chronic systolic (congestive) heart failure: Secondary | ICD-10-CM | POA: Diagnosis not present

## 2022-01-15 NOTE — Progress Notes (Signed)
EPIC Encounter for ICM Monitoring ? ?Patient Name: Anita Warren is a 61 y.o. female ?Date: 01/15/2022 ?Primary Care Physican: Redmond School, MD ?Primary Cardiologist: [name] ?Electrophysiologist: Allred ?12/09/2021 Weight: 173 lbs  ?12/17/2021 Weight: 170 lbs ?01/15/2022 Weight: 168 lbs ?                                                         ?  ?Spoke with patient and heart failure questions reviewed.  Pt asymptomatic for fluid accumulation and feeling well.    She has been drinking more fluids during decreased impedance. ?  ?Coruve thoracic impedance normal but was suggesting possible fluid accumulation from 3/5-3/16.   ?  ?Prescribed:  ?Furosemide 20 mg take 1 tablet (20 mg total) daily.   ?Potassium 10 mEq take 1 tablet daily. ?Spironolactone 50 mg take 1 tablet daily   ?  ?Labs: ?08/27/2021 Creatinine 1.56, BUN 24, Potassium 3.7, Sodium 137, GFR 38 ?A complete set of results can be found in Results Review. ?  ?Recommendations:  No changes and encouraged to call if experiencing any fluid symptoms. ?  ?Follow-up plan: ICM clinic phone appointment on 02/17/2022.   91 day device clinic remote transmission 03/25/2022.    ?  ?EP/Cardiology Office Visits:  08/01/2022 with Dr Rayann Heman ?  ?Copy of ICM check sent to Dr. Rayann Heman.    ?  ?3 month ICM trend: 01/13/2022. ? ? ? ?12-14 Month ICM trend:  ? ? ? ?Rosalene Billings, RN ?01/15/2022 ?4:42 PM ? ?

## 2022-02-17 ENCOUNTER — Ambulatory Visit (INDEPENDENT_AMBULATORY_CARE_PROVIDER_SITE_OTHER): Payer: Medicare Other

## 2022-02-17 DIAGNOSIS — I5022 Chronic systolic (congestive) heart failure: Secondary | ICD-10-CM

## 2022-02-17 DIAGNOSIS — Z95810 Presence of automatic (implantable) cardiac defibrillator: Secondary | ICD-10-CM | POA: Diagnosis not present

## 2022-02-18 ENCOUNTER — Telehealth: Payer: Self-pay

## 2022-02-18 NOTE — Progress Notes (Signed)
EPIC Encounter for ICM Monitoring ? ?Patient Name: Anita Warren is a 61 y.o. female ?Date: 02/18/2022 ?Primary Care Physican: Redmond School, MD ?Primary Cardiologist: [name] ?Electrophysiologist: Allred ?12/09/2021 Weight: 173 lbs  ?12/17/2021 Weight: 170 lbs ?01/15/2022 Weight: 168 lbs ?                                                         ?  ?Attempted call to patient and unable to reach.  Left detailed message per DPR regarding transmission. Transmission reviewed.  ?  ?Coruve thoracic impedance normal but was suggesting possible fluid accumulation from 3/30-4/11.   ?  ?Prescribed:  ?Furosemide 20 mg take 1 tablet (20 mg total) daily.   ?Potassium 10 mEq take 1 tablet daily. ?Spironolactone 50 mg take 1 tablet daily   ?  ?Labs: ?08/27/2021 Creatinine 1.56, BUN 24, Potassium 3.7, Sodium 137, GFR 38 ?A complete set of results can be found in Results Review. ?  ?Recommendations:  Left voice mail with ICM number and encouraged to call if experiencing any fluid symptoms. ?  ?Follow-up plan: ICM clinic phone appointment on 03/26/2022.   91 day device clinic remote transmission 03/25/2022.    ?  ?EP/Cardiology Office Visits:  08/01/2022 with Dr Rayann Heman ?  ?Copy of ICM check sent to Dr. Rayann Heman.     ? ?3 month ICM trend: 02/17/2022. ? ? ? ?12-14 Month ICM trend:  ? ? ? ?Rosalene Billings, RN ?02/18/2022 ?11:42 AM ? ?

## 2022-02-18 NOTE — Telephone Encounter (Signed)
Remote ICM transmission received.  Attempted call to patient regarding ICM remote transmission and left detailed message per DPR.  Advised to return call for any fluid symptoms or questions. Next ICM remote transmission scheduled 03/26/2022.   ? ?

## 2022-03-25 ENCOUNTER — Telehealth: Payer: Self-pay

## 2022-03-25 ENCOUNTER — Ambulatory Visit: Payer: Medicare Other

## 2022-03-25 ENCOUNTER — Ambulatory Visit (INDEPENDENT_AMBULATORY_CARE_PROVIDER_SITE_OTHER): Payer: Medicare Other

## 2022-03-25 DIAGNOSIS — I428 Other cardiomyopathies: Secondary | ICD-10-CM

## 2022-03-25 LAB — CUP PACEART REMOTE DEVICE CHECK
Battery Remaining Longevity: 8 mo
Battery Remaining Percentage: 6 %
Battery Voltage: 2.65 V
Brady Statistic RV Percent Paced: 1 %
Date Time Interrogation Session: 20230529035251
HighPow Impedance: 37 Ohm
Implantable Lead Implant Date: 20120511
Implantable Lead Location: 753860
Implantable Lead Model: 7120
Implantable Pulse Generator Implant Date: 20120511
Lead Channel Impedance Value: 480 Ohm
Lead Channel Pacing Threshold Amplitude: 0.75 V
Lead Channel Pacing Threshold Pulse Width: 0.5 ms
Lead Channel Sensing Intrinsic Amplitude: 11.7 mV
Lead Channel Setting Pacing Amplitude: 2.5 V
Lead Channel Setting Pacing Pulse Width: 0.5 ms
Lead Channel Setting Sensing Sensitivity: 0.5 mV
Pulse Gen Serial Number: 634653

## 2022-03-25 NOTE — Telephone Encounter (Signed)
Scheduled remote reviewed. Normal device function.  Battery estimated 7.54moNext remote to be determined. Route to triage.   Patient called and made aware to increase battery checks to monthly.Updated in [employer] well.

## 2022-03-26 ENCOUNTER — Ambulatory Visit (INDEPENDENT_AMBULATORY_CARE_PROVIDER_SITE_OTHER): Payer: Medicare Other

## 2022-03-26 DIAGNOSIS — Z95810 Presence of automatic (implantable) cardiac defibrillator: Secondary | ICD-10-CM | POA: Diagnosis not present

## 2022-03-26 DIAGNOSIS — I5022 Chronic systolic (congestive) heart failure: Secondary | ICD-10-CM

## 2022-03-26 LAB — CUP PACEART REMOTE DEVICE CHECK
Battery Remaining Longevity: 8 mo
Battery Remaining Percentage: 6 %
Battery Voltage: 2.65 V
Brady Statistic RV Percent Paced: 1 %
Date Time Interrogation Session: 20230529035251
HighPow Impedance: 37 Ohm
Implantable Lead Implant Date: 20120511
Implantable Lead Location: 753860
Implantable Lead Model: 7120
Implantable Pulse Generator Implant Date: 20120511
Lead Channel Impedance Value: 480 Ohm
Lead Channel Pacing Threshold Amplitude: 0.75 V
Lead Channel Pacing Threshold Pulse Width: 0.5 ms
Lead Channel Sensing Intrinsic Amplitude: 11.7 mV
Lead Channel Setting Pacing Amplitude: 2.5 V
Lead Channel Setting Pacing Pulse Width: 0.5 ms
Lead Channel Setting Sensing Sensitivity: 0.5 mV
Pulse Gen Serial Number: 634653

## 2022-03-28 NOTE — Progress Notes (Signed)
EPIC Encounter for ICM Monitoring  Patient Name: Anita Warren is a 61 y.o. female Date: 03/28/2022 Primary Care Physican: Redmond School, MD Primary Cardiologist: [name] Electrophysiologist: Allred 12/09/2021 Weight: 173 lbs  12/17/2021 Weight: 170 lbs 01/15/2022 Weight: 168 lbs  Battery Longevity: 7.8 months                                                            Spoke with patient and heart failure questions reviewed.  Pt asymptomatic for fluid accumulation.  Reports feeling well at this time and voices no complaints.    Coruve thoracic impedance normal.     Prescribed:  Furosemide 20 mg take 1 tablet (20 mg total) daily.   Potassium 10 mEq take 1 tablet daily. Spironolactone 50 mg take 1 tablet daily     Labs: 08/27/2021 Creatinine 1.56, BUN 24, Potassium 3.7, Sodium 137, GFR 38 A complete set of results can be found in Results Review.   Recommendations: No changes and encouraged to call if experiencing any fluid symptoms.   Follow-up plan: ICM clinic phone appointment on 04/28/2022.   91 day device clinic remote transmission 07/01/2022.      EP/Cardiology Office Visits:  08/01/2022 with Dr Rayann Heman   Copy of ICM check sent to Dr. Rayann Heman.       3 month ICM trend: 03/25/2022.    12-14 Month ICM trend:     Rosalene Billings, RN 03/28/2022 4:48 PM

## 2022-04-02 ENCOUNTER — Other Ambulatory Visit: Payer: Self-pay | Admitting: Cardiology

## 2022-04-10 NOTE — Progress Notes (Signed)
Remote ICD transmission.   

## 2022-04-28 ENCOUNTER — Ambulatory Visit (INDEPENDENT_AMBULATORY_CARE_PROVIDER_SITE_OTHER): Payer: Medicare Other

## 2022-04-28 DIAGNOSIS — I428 Other cardiomyopathies: Secondary | ICD-10-CM

## 2022-04-30 ENCOUNTER — Ambulatory Visit (INDEPENDENT_AMBULATORY_CARE_PROVIDER_SITE_OTHER): Payer: Medicare Other

## 2022-04-30 DIAGNOSIS — Z95810 Presence of automatic (implantable) cardiac defibrillator: Secondary | ICD-10-CM | POA: Diagnosis not present

## 2022-04-30 DIAGNOSIS — I5022 Chronic systolic (congestive) heart failure: Secondary | ICD-10-CM

## 2022-04-30 LAB — CUP PACEART REMOTE DEVICE CHECK
Battery Remaining Longevity: 7 mo
Battery Remaining Percentage: 6 %
Battery Voltage: 2.63 V
Brady Statistic RV Percent Paced: 1 %
Date Time Interrogation Session: 20230704220356
HighPow Impedance: 40 Ohm
Implantable Lead Implant Date: 20120511
Implantable Lead Location: 753860
Implantable Lead Model: 7120
Implantable Pulse Generator Implant Date: 20120511
Lead Channel Impedance Value: 540 Ohm
Lead Channel Pacing Threshold Amplitude: 0.75 V
Lead Channel Pacing Threshold Pulse Width: 0.5 ms
Lead Channel Sensing Intrinsic Amplitude: 11.7 mV
Lead Channel Setting Pacing Amplitude: 2.5 V
Lead Channel Setting Pacing Pulse Width: 0.5 ms
Lead Channel Setting Sensing Sensitivity: 0.5 mV
Pulse Gen Serial Number: 634653

## 2022-05-02 NOTE — Progress Notes (Signed)
EPIC Encounter for ICM Monitoring  Patient Name: Anita Warren is a 61 y.o. female Date: 05/02/2022 Primary Care Physican: Redmond School, MD Primary Cardiologist: [name] Electrophysiologist: Allred 12/09/2021 Weight: 173 lbs  12/17/2021 Weight: 170 lbs 01/15/2022 Weight: 168 lbs 05/02/2022 Weight: 168 lbs  Battery Longevity: 7.4 months                                                            Spoke with patient and heart failure questions reviewed.  Pt asymptomatic for fluid accumulation.  Reports feeling well at this time and voices no complaints.     Coruve thoracic impedance normal.     Prescribed:  Furosemide 20 mg take 1 tablet (20 mg total) daily.   Potassium 10 mEq take 1 tablet daily. Spironolactone 50 mg take 1 tablet daily     Labs: 08/27/2021 Creatinine 1.56, BUN 24, Potassium 3.7, Sodium 137, GFR 38 A complete set of results can be found in Results Review.   Recommendations:  No changes and encouraged to call if experiencing any fluid symptoms.   Follow-up plan: ICM clinic phone appointment on 06/02/2022.   91 day device clinic remote transmission 07/01/2022.      EP/Cardiology Office Visits:  08/01/2022 with Dr Rayann Heman.  Advised to call HF clinic for appointment (for further refills).   Copy of ICM check sent to Dr. Rayann Heman.       3 month ICM trend: 04/30/2022.    12-14 Month ICM trend:     Rosalene Billings, RN 05/02/2022 7:52 AM

## 2022-05-09 ENCOUNTER — Encounter (HOSPITAL_COMMUNITY): Payer: Medicare Other | Admitting: Cardiology

## 2022-05-22 NOTE — Progress Notes (Signed)
Remote ICD transmission.   

## 2022-05-28 LAB — CUP PACEART REMOTE DEVICE CHECK
Battery Remaining Longevity: 7 mo
Battery Remaining Percentage: 6 %
Battery Voltage: 2.63 V
Brady Statistic RV Percent Paced: 1 %
Date Time Interrogation Session: 20230801141834
HighPow Impedance: 37 Ohm
Implantable Lead Implant Date: 20120511
Implantable Lead Location: 753860
Implantable Lead Model: 7120
Implantable Pulse Generator Implant Date: 20120511
Lead Channel Impedance Value: 430 Ohm
Lead Channel Pacing Threshold Amplitude: 0.75 V
Lead Channel Pacing Threshold Pulse Width: 0.5 ms
Lead Channel Sensing Intrinsic Amplitude: 10.1 mV
Lead Channel Setting Pacing Amplitude: 2.5 V
Lead Channel Setting Pacing Pulse Width: 0.5 ms
Lead Channel Setting Sensing Sensitivity: 0.5 mV
Pulse Gen Serial Number: 634653

## 2022-05-29 ENCOUNTER — Ambulatory Visit (INDEPENDENT_AMBULATORY_CARE_PROVIDER_SITE_OTHER): Payer: Medicare Other

## 2022-05-29 DIAGNOSIS — I428 Other cardiomyopathies: Secondary | ICD-10-CM

## 2022-06-02 ENCOUNTER — Ambulatory Visit (INDEPENDENT_AMBULATORY_CARE_PROVIDER_SITE_OTHER): Payer: Medicare Other

## 2022-06-02 DIAGNOSIS — I5022 Chronic systolic (congestive) heart failure: Secondary | ICD-10-CM | POA: Diagnosis not present

## 2022-06-02 DIAGNOSIS — Z95810 Presence of automatic (implantable) cardiac defibrillator: Secondary | ICD-10-CM

## 2022-06-04 NOTE — Progress Notes (Signed)
EPIC Encounter for ICM Monitoring  Patient Name: Anita Warren is a 61 y.o. female Date: 06/04/2022 Primary Care Physican: Redmond School, MD Primary Cardiologist: [name] Electrophysiologist: Allred 12/09/2021 Weight: 173 lbs  12/17/2021 Weight: 170 lbs 01/15/2022 Weight: 168 lbs 05/02/2022 Weight: 168 lbs 06/04/2022 Weight: 168 lbs   Battery Longevity: 7.4 months                                                            Spoke with patient and heart failure questions reviewed.  Pt asymptomatic for fluid accumulation.  Reports feeling well at this time and voices no complaints.  Her fluid intake fluctuates depending on if the weather is hot.   Coruve thoracic impedance normal with intermittent days of possible fluid accumulation and dryness.     Prescribed:  Furosemide 20 mg take 1 tablet (20 mg total) daily.   Potassium 10 mEq take 1 tablet daily. Spironolactone 50 mg take 1 tablet daily     Labs: 08/27/2021 Creatinine 1.56, BUN 24, Potassium 3.7, Sodium 137, GFR 38 A complete set of results can be found in Results Review.   Recommendations:  No changes and encouraged to call if experiencing any fluid symptoms.   Follow-up plan: ICM clinic phone appointment on 07/07/2022.   91 day device clinic remote transmission 07/01/2022.      EP/Cardiology Office Visits:  08/01/2022 with Dr Rayann Heman.  07/28/2022 with Dr [name].   Copy of ICM check sent to Dr. Rayann Heman.      3 month ICM trend: 06/02/2022.    12-14 Month ICM trend:     Rosalene Billings, RN 06/04/2022 4:19 PM

## 2022-06-19 NOTE — Progress Notes (Signed)
Remote ICD transmission.   

## 2022-06-22 ENCOUNTER — Other Ambulatory Visit: Payer: Self-pay | Admitting: Cardiology

## 2022-06-30 LAB — CUP PACEART REMOTE DEVICE CHECK
Battery Remaining Longevity: 7 mo
Battery Remaining Percentage: 6 %
Battery Voltage: 2.63 V
Brady Statistic RV Percent Paced: 1 %
Date Time Interrogation Session: 20230901022841
HighPow Impedance: 38 Ohm
Implantable Lead Implant Date: 20120511
Implantable Lead Location: 753860
Implantable Lead Model: 7120
Implantable Pulse Generator Implant Date: 20120511
Lead Channel Impedance Value: 430 Ohm
Lead Channel Pacing Threshold Amplitude: 0.75 V
Lead Channel Pacing Threshold Pulse Width: 0.5 ms
Lead Channel Sensing Intrinsic Amplitude: 10.5 mV
Lead Channel Setting Pacing Amplitude: 2.5 V
Lead Channel Setting Pacing Pulse Width: 0.5 ms
Lead Channel Setting Sensing Sensitivity: 0.5 mV
Pulse Gen Serial Number: 634653

## 2022-07-01 ENCOUNTER — Ambulatory Visit: Payer: Medicare Other

## 2022-07-01 DIAGNOSIS — I5022 Chronic systolic (congestive) heart failure: Secondary | ICD-10-CM

## 2022-07-02 ENCOUNTER — Encounter: Payer: Self-pay | Admitting: Internal Medicine

## 2022-07-02 DIAGNOSIS — Z23 Encounter for immunization: Secondary | ICD-10-CM | POA: Diagnosis not present

## 2022-07-02 DIAGNOSIS — E559 Vitamin D deficiency, unspecified: Secondary | ICD-10-CM | POA: Diagnosis not present

## 2022-07-02 DIAGNOSIS — E039 Hypothyroidism, unspecified: Secondary | ICD-10-CM | POA: Diagnosis not present

## 2022-07-02 DIAGNOSIS — I4891 Unspecified atrial fibrillation: Secondary | ICD-10-CM | POA: Diagnosis not present

## 2022-07-02 DIAGNOSIS — I130 Hypertensive heart and chronic kidney disease with heart failure and stage 1 through stage 4 chronic kidney disease, or unspecified chronic kidney disease: Secondary | ICD-10-CM | POA: Diagnosis not present

## 2022-07-02 DIAGNOSIS — D518 Other vitamin B12 deficiency anemias: Secondary | ICD-10-CM | POA: Diagnosis not present

## 2022-07-02 DIAGNOSIS — Z0001 Encounter for general adult medical examination with abnormal findings: Secondary | ICD-10-CM | POA: Diagnosis not present

## 2022-07-02 DIAGNOSIS — N1831 Chronic kidney disease, stage 3a: Secondary | ICD-10-CM | POA: Diagnosis not present

## 2022-07-02 DIAGNOSIS — I5022 Chronic systolic (congestive) heart failure: Secondary | ICD-10-CM | POA: Diagnosis not present

## 2022-07-02 DIAGNOSIS — I428 Other cardiomyopathies: Secondary | ICD-10-CM | POA: Diagnosis not present

## 2022-07-02 DIAGNOSIS — J0100 Acute maxillary sinusitis, unspecified: Secondary | ICD-10-CM | POA: Diagnosis not present

## 2022-07-02 DIAGNOSIS — I10 Essential (primary) hypertension: Secondary | ICD-10-CM | POA: Diagnosis not present

## 2022-07-07 ENCOUNTER — Ambulatory Visit (INDEPENDENT_AMBULATORY_CARE_PROVIDER_SITE_OTHER): Payer: Medicare Other

## 2022-07-07 DIAGNOSIS — Z95810 Presence of automatic (implantable) cardiac defibrillator: Secondary | ICD-10-CM | POA: Diagnosis not present

## 2022-07-07 DIAGNOSIS — I5022 Chronic systolic (congestive) heart failure: Secondary | ICD-10-CM

## 2022-07-09 NOTE — Progress Notes (Signed)
EPIC Encounter for ICM Monitoring  Patient Name: Anita Warren is a 61 y.o. female Date: 07/09/2022 Primary Care Physican: Redmond School, MD Primary Cardiologist: [name] Electrophysiologist: Mealor 12/09/2021 Weight: 173 lbs  12/17/2021 Weight: 170 lbs 01/15/2022 Weight: 168 lbs 05/02/2022 Weight: 168 lbs 06/04/2022 Weight: 168 lbs   Battery Longevity: 7.4 months                                                            Spoke with patient and heart failure questions reviewed.  Pt asymptomatic for fluid accumulation.  Pt taking prednisone for sinus infection. She was drinking a lot of fluids due to sinus infection.   Coruve thoracic impedance suggesting fluid accumulation from 8/31-9/8 followed by possible dryness 9/9 and returned close to baseline 9/11.    Prescribed:  Furosemide 20 mg take 1 tablet (20 mg total) daily.   Potassium 10 mEq take 1 tablet daily. Spironolactone 50 mg take 1 tablet daily     Labs: 08/27/2021 Creatinine 1.56, BUN 24, Potassium 3.7, Sodium 137, GFR 38 A complete set of results can be found in Results Review.   Recommendations:  No changes and encouraged to call if experiencing any fluid symptoms.   Follow-up plan: ICM clinic phone appointment on 08/11/2022.   91 day device clinic remote transmission 10/02/2022.      EP/Cardiology Office Visits:  08/05/2022 with Dr Myles Gip.  07/28/2022 with Dr [name].   Copy of ICM check sent to Dr. Myles Gip.   3 month ICM trend: 07/07/2022.    12-14 Month ICM trend:     Rosalene Billings, RN 07/09/2022 7:54 AM

## 2022-07-21 NOTE — Progress Notes (Signed)
Remote ICD transmission.   

## 2022-07-28 ENCOUNTER — Ambulatory Visit (HOSPITAL_COMMUNITY)
Admission: RE | Admit: 2022-07-28 | Discharge: 2022-07-28 | Disposition: A | Discharge status: Home / Self Care | Payer: Medicare Other | Source: Ambulatory Visit | Attending: Cardiology | Admitting: Cardiology

## 2022-07-28 VITALS — BP 110/80 | HR 90 | Wt 172.4 lb

## 2022-07-28 DIAGNOSIS — N183 Chronic kidney disease, stage 3 unspecified: Secondary | ICD-10-CM | POA: Diagnosis not present

## 2022-07-28 DIAGNOSIS — I4892 Unspecified atrial flutter: Secondary | ICD-10-CM | POA: Insufficient documentation

## 2022-07-28 DIAGNOSIS — I5022 Chronic systolic (congestive) heart failure: Secondary | ICD-10-CM | POA: Diagnosis not present

## 2022-07-28 DIAGNOSIS — I428 Other cardiomyopathies: Secondary | ICD-10-CM | POA: Diagnosis not present

## 2022-07-28 DIAGNOSIS — Z7901 Long term (current) use of anticoagulants: Secondary | ICD-10-CM | POA: Insufficient documentation

## 2022-07-28 DIAGNOSIS — I3139 Other pericardial effusion (noninflammatory): Secondary | ICD-10-CM | POA: Insufficient documentation

## 2022-07-28 DIAGNOSIS — T783XXA Angioneurotic edema, initial encounter: Secondary | ICD-10-CM | POA: Diagnosis not present

## 2022-07-28 DIAGNOSIS — I130 Hypertensive heart and chronic kidney disease with heart failure and stage 1 through stage 4 chronic kidney disease, or unspecified chronic kidney disease: Secondary | ICD-10-CM | POA: Insufficient documentation

## 2022-07-28 DIAGNOSIS — Z79899 Other long term (current) drug therapy: Secondary | ICD-10-CM | POA: Insufficient documentation

## 2022-07-28 DIAGNOSIS — Z95810 Presence of automatic (implantable) cardiac defibrillator: Secondary | ICD-10-CM | POA: Diagnosis not present

## 2022-07-28 LAB — BASIC METABOLIC PANEL
Anion gap: 7 (ref 5–15)
BUN: 25 mg/dL — ABNORMAL HIGH (ref 8–23)
CO2: 22 mmol/L (ref 22–32)
Calcium: 9.4 mg/dL (ref 8.9–10.3)
Chloride: 108 mmol/L (ref 98–111)
Creatinine, Ser: 1.56 mg/dL — ABNORMAL HIGH (ref 0.44–1.00)
GFR, Estimated: 38 mL/min — ABNORMAL LOW (ref >60)
Glucose, Bld: 128 mg/dL — ABNORMAL HIGH (ref 70–99)
Potassium: 4.5 mmol/L (ref 3.5–5.1)
Sodium: 137 mmol/L (ref 135–145)

## 2022-07-28 LAB — CBC
HCT: 34.7 % — ABNORMAL LOW (ref 36.0–46.0)
Hemoglobin: 11.2 g/dL — ABNORMAL LOW (ref 12.0–15.0)
MCH: 29.3 pg (ref 26.0–34.0)
MCHC: 32.3 g/dL (ref 30.0–36.0)
MCV: 90.8 fL (ref 80.0–100.0)
Platelets: 160 10*3/uL (ref 150–400)
RBC: 3.82 MIL/uL — ABNORMAL LOW (ref 3.87–5.11)
RDW: 16 % — ABNORMAL HIGH (ref 11.5–15.5)
WBC: 4.6 10*3/uL (ref 4.0–10.5)
nRBC: 0 % (ref 0.0–0.2)

## 2022-07-28 LAB — BRAIN NATRIURETIC PEPTIDE: B Natriuretic Peptide: 52.6 pg/mL (ref 0.0–100.0)

## 2022-07-28 NOTE — Patient Instructions (Signed)
There has been no changes to your medications.  Labs done today, your results will be available in MyChart, we will contact you for abnormal readings.  Repeat blood work in 3 months at [facility] has requested that you have an echocardiogram. Echocardiography is a painless test that uses sound waves to create images of your heart. It provides your doctor with information about the size and shape of your heart and how well your heart's chambers and valves are working. This procedure takes approximately one hour. There are no restrictions for this procedure.  Your physician recommends that you schedule a follow-up appointment in: 6 months ( March 2024)  ** please call the office in January to arrange your follow up appointment **   If you have any questions or concerns before your next appointment please send [place] a message through [facility] or call our office at [phone].    TO LEAVE A MESSAGE FOR THE NURSE SELECT OPTION 2, PLEASE LEAVE A MESSAGE INCLUDING: YOUR NAME DATE OF BIRTH CALL BACK NUMBER REASON FOR CALL**this is important as we prioritize the call backs  YOU WILL RECEIVE A CALL BACK THE SAME DAY AS LONG AS YOU CALL BEFORE 4:00 PM  At the [facility], you and your health needs are our priority. As part of our continuing mission to provide you with exceptional heart care, we have created designated Provider Care Teams. These Care Teams include your primary Cardiologist (physician) and Advanced Practice Providers (APPs- Physician Assistants and Nurse Practitioners) who all work together to provide you with the care you need, when you need it.   You may see any of the following providers on your designated Care Team at your next follow up: Dr Glori Bickers Dr Loralie Champagne Dr. Roxana Hires, NP Lyda Jester, [facility] [place], [place] Forestine Na, NP Audry Riles, PharmD   Please be sure to bring in all your  medications bottles to every appointment.

## 2022-07-28 NOTE — Progress Notes (Signed)
Patient ID: Anita Warren, female   DOB: 1961-05-18, 60 y.o.   MRN: [id number]     Advanced Heart Failure Clinic Note   Primary Physician: Glo Herring., MD  HF Cardiology: Dr. [name] EP: Dr Allred  Urology: Dr Risa Grill  HPI: Anita Warren is a 61 y.o. female with history of viral, non-ischemic cardiomyopathy, severe systolic dysfunction with EF of 10% s/p ICD pacemaker in the setting of VT 02/2011 (St. Jude);  cath 2012 with no CAD.   Admitted 7/15 with acute on chronic systolic CHF she was not taking medications for 4 months because she lost her insurance. She was diuresed with IV lasix and transitioned to lasix 40 mg daily. She was also discharged on 12.5 mg carvedilol twice a day, hydralazine 25 mg tid, and 30 mg Imdur daily. Discharge weight was 176 pounds. She was not on an ACEI due to angioedema.  Admitted from HF clinic 06/02/14 with low output heart failure. Swan placed and showed cardiogenic shock. Ultimately discharged on  Milrinone 0.25 mcg/kg/min. Hospital stay was complicated by atrial flutter. Loaded on amiodarone and started on eliquis 5 mg twice a day. Met with Dr Prescott Gum and VAD coordinator. She was titrated off the milrinone.  Echo in 8/17 showed EF up to 50%, echo in 8/18 showed EF 45%.  Echo in 11/19 showed EF 35-40%. Echo in 11/21 showed EF stable 40% with normal RV.  Echo in 12/22 showed EF 35-40%, moderate LV dilation, mildly decreased RV systolic function, moderate pericardial effusion.   She presents for followup of CHF.  Weight down 1 lb.  She reports no exertional dyspnea or chest pain.  No orthopnea/PND.  No lightheadedness.  She will be getting married in January.    ECG (personally reviewed): NSR, LVH  St Jude device interrogation: stable thoracic impedance, no VF/VT.   05/23/14 ECHO EF 5-10% RV mod to severely dilated.  08/09/14 ECHO EF 20-25% RV read as normal 8/17 ECHO EF 50%, mild LV dilation, normal RV size and systolic function.  8/18  ECHO EF 45%, diffuse HK worse inferiorly, normal RV size and systolic function.  11/19 ECHO EF 35-40%, mild LV dilation with diffuse hypokinesis, normal RV size with mildly decreased systolic function.  11/21 ECHO EF 40%, hypokinesis of the basal to mid septum and the basal inferior wall, RV normal, small pericardial effusion.  12/22 ECHO EF 35-40%, moderate LV dilation, mildly decreased RV systolic function, moderate pericardial effusion.  Labs:  HIV negative 2015  9/15: K 3.6 => 4.6, creatinine 0.99 => 1.5 => 1.2, Mg 1.9, HCT 45.2, LFTs normal, TSH normal 9/15: PFTs normal 07/31/14: Co-ox off milrinone 66%  K 4.5 Cr 1.36 08/23/14: k 4.4, cr 1.51 digoxin 0.7 09/20/14: K 4.9, creatinine 1.64  12/15: K 4.4, creatinine 1.36 2/16: K 4.2, creatinine 1.67, HCT 33.9 04/28/2016: K 4.1 Creatinine 2.67  8/17: TSH normal, LFTs normal 9/17: K 3.5, creatinine 1.67, hgb 9.9 5/18: hgb 10.7, K 3.8, creatinine 1.45 9/18: K 4.4, creatinine 1.53 4/19: K 3.8, creatinine 1.32, hgb 10.5 6/21: K 4.3, creatinine 1.34, LFTs normal 7/21: hgb 10.4 8/21: K 3.9, creatinine 1.38 11/21: K 3.8, creatinine 1.36 11/22: K 3.7, creatinine 1.56, BNP 29  2013 Colonoscopy Blood Type:  B+  Lower Extremity Doppler - Normal arterial flow Carotid Doppler-  1-39% stenosis involving the right internal carotid artery and the left internal carotid Artery.  SH: Disabled. Has 3 grown children. Does not smoke or drink alcohol.  Not married (fiance passed away  in 2021). 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    FH: Mom died at 19 CAD         Sister and Brother HTN   Review of systems complete and found to be negative unless listed in HPI.    Past Medical History:  Diagnosis Date   AICD (automatic cardioverter/defibrillator) present    St. Jude AICD implanted 03-07-2011 Dr. Allred/-Dr. [name] now follows   CHF (congestive heart failure) ([place])    meds controlling, no episodes since 3748   Chronic systolic heart failure ([place])     Essential hypertension, benign    Headache    History of kidney stones    multiple kidney stones in past   History of medication noncompliance    Hydronephrosis with renal and ureteral calculus obstruction 09/05/2013   Nonischemic cardiomyopathy ([place])    LVEF 5-10%, likely viral (no CAD by cath 01/30/11)   NSVT (nonsustained ventricular tachycardia)    Obesity     Current Outpatient Medications  Medication Sig Dispense Refill   acetaminophen (TYLENOL) 500 MG tablet Take 1,000 mg by mouth every 6 (six) hours as needed for headache.      carvedilol (COREG) 25 MG tablet TAKE 1 TABLET BY MOUTH TWICE DAILY WITH A MEAL 180 tablet 3   Coenzyme Q10 (CO Q 10) 100 MG CAPS Take 100 mg by mouth daily.      diphenhydrAMINE (BENADRYL) 25 MG tablet Take 25 mg by mouth every 6 (six) hours as needed (headache).      ELIQUIS 5 MG TABS tablet Take 1 tablet by mouth twice daily 180 tablet 3   FARXIGA 10 MG TABS tablet TAKE 1 TABLET BY MOUTH ONCE DAILY BEFORE BREAKFAST 90 tablet 3   furosemide (LASIX) 20 MG tablet Take 1 tablet (20 mg total) by mouth daily. 90 tablet 3   hydrALAZINE (APRESOLINE) 100 MG tablet Take 1 tablet (100 mg total) by mouth 3 (three) times daily. 270 tablet 3   isosorbide mononitrate (IMDUR) 30 MG 24 hr tablet Take 3 tablets (90 mg total) by mouth daily. 270 tablet 6   potassium chloride (KLOR-CON) 10 MEQ tablet Take 1 tablet by mouth once daily 90 tablet 3   spironolactone (ALDACTONE) 50 MG tablet Take 1 tablet by mouth once daily 90 tablet 3   No current facility-administered medications for this encounter.   PHYSICAL EXAM: Vitals:   07/28/22 0953  BP: 110/80  Pulse: 90  SpO2: 98%  Weight: 78.2 kg (172 lb 6.4 oz)    Wt Readings from Last 3 Encounters:  07/28/22 78.2 kg (172 lb 6.4 oz)  08/27/21 78.6 kg (173 lb 3.2 oz)  08/02/21 80.6 kg (177 lb 9.6 oz)    General: NAD Neck: No JVD, no thyromegaly or thyroid nodule.  Lungs: Clear to auscultation bilaterally with normal  respiratory effort. CV: Nondisplaced PMI.  Heart regular S1/S2, no S3/S4, no murmur.  No peripheral edema.  No carotid bruit.  Normal pedal pulses.  Abdomen: Soft, nontender, no hepatosplenomegaly, no distention.  Skin: Intact without lesions or rashes.  Neurologic: Alert and oriented x 3.  Psych: Normal affect. Extremities: No clubbing or cyanosis.  HEENT: Normal.   ASSESSMENT & PLAN: 1. Chronic Systolic Heart Failure: Nonischemic cardiomyopathy thought to potentially be due to viral myocarditis noted initially in 2011. Cath 2012 normal coronaries.  St Jude ICD. Echo (7/15) with EF 5-10% and RV mod-severely dilated with moderately decreased systolic function. Repeat echo 10/15 with EF up to 20-25%. She was on  milrinone briefly then stopped.  Echo in 8/17 with EF up to 50%. Repeat echo 11/19 showed EF back down to 35-40% with normal RV. Echo in 11/21 showed stable EF 40% and septal + inferior wall motion abnormalities.  Echo in 12/22 showed EF 35-40%, mild RV dysfunction, moderate pericardial effusion.  NYHA class I-II, she is not volume overloaded by exam or Corvue.  - Continue Lasix 20 mg daily, BMET/BNP today.   - Continue spironolactone 50 mg daily. Should get BMET every 3 months.    - No ACEI/ARB/ARNI due to recurrent episodes of angioedema with ACEI and also ARB.  - Continue Coreg 25 mg bid.   - Continue hydralazine 100 mg tid and Imdur 90 mg daily.   - I will arrange for repeat echo.  2. Atrial flutter: Paroxysmal, NSR today.  - She is now off amiodarone.  If atrial flutter recurs, should have ablation.  - Continue Eliquis 5 mg BID. CBC today.  3. Angioedema: Had in past on ACEI, had another episode after ACEI stopped.  She is no longer on ACEI, ARNI, or ARB. ?Hereditary or acquired C1 inhibitor deficiency. No further problems with this. 4. CKD: Stage 3 - BMET today.    Followup in 6 months with APP if echo stable.  BMET in 3 months.   Loralie Champagne, MD  07/28/2022

## 2022-08-01 ENCOUNTER — Encounter: Payer: Medicare Other | Admitting: Internal Medicine

## 2022-08-05 ENCOUNTER — Encounter: Payer: Self-pay | Admitting: Cardiovascular Disease

## 2022-08-05 ENCOUNTER — Ambulatory Visit: Payer: Medicare Other | Attending: Internal Medicine | Admitting: Cardiovascular Disease

## 2022-08-05 ENCOUNTER — Encounter: Payer: Self-pay | Admitting: Cardiology

## 2022-08-05 VITALS — BP 114/70 | HR 63 | Ht 64.0 in | Wt 174.4 lb

## 2022-08-05 DIAGNOSIS — I5022 Chronic systolic (congestive) heart failure: Secondary | ICD-10-CM | POA: Diagnosis not present

## 2022-08-05 DIAGNOSIS — I480 Paroxysmal atrial fibrillation: Secondary | ICD-10-CM

## 2022-08-05 NOTE — Progress Notes (Signed)
PCP: Redmond School, MD Primary Cardiologist: Dr [name] Primary EP: Dr Rayann Heman  Anita Warren is a 61 y.o. female who presents today for routine electrophysiology followup.    She was last seen by Dr. Rayann Heman a year ago.  Since last being seen in our clinic, the patient reports doing very well.  Today, she denies symptoms of palpitations, chest pain, shortness of breath,  lower extremity edema, dizziness, presyncope, syncope, or ICD shocks.  The patient is otherwise without complaint today.   She is getting married in January.  Past Medical History:  Diagnosis Date   AICD (automatic cardioverter/defibrillator) present    St. Jude AICD implanted 03-07-2011 Dr. Allred/-Dr. [name] now follows   CHF (congestive heart failure) ([place])    meds controlling, no episodes since 2229   Chronic systolic heart failure ([place])    Essential hypertension, benign    Headache    History of kidney stones    multiple kidney stones in past   History of medication noncompliance    Hydronephrosis with renal and ureteral calculus obstruction 09/05/2013   Nonischemic cardiomyopathy ([place])    LVEF 5-10%, likely viral (no CAD by cath 01/30/11)   NSVT (nonsustained ventricular tachycardia)    Obesity    Past Surgical History:  Procedure Laterality Date   ABDOMINAL HYSTERECTOMY     BREAST BIOPSY Left 10/19/2019   Procedure: BREAST BIOPSY WITH NEEDLE LOCALIZATION;  Surgeon: Aviva Signs, MD;  Location: [facility];  Service: General;  Laterality: Left;   BREAST LUMPECTOMY  1989   L breast- benign   CARDIAC CATHETERIZATION     CARDIAC DEFIBRILLATOR PLACEMENT  5/12   SJM by JA   CESAREAN SECTION     x 2   CHOLECYSTECTOMY     COLONOSCOPY  07/02/2012   Procedure: COLONOSCOPY;  Surgeon: [name], MD;  Location: [facility];  Service: Endoscopy;  Laterality: N/A;  1:15/PATIENT HAS A DEFIBRILLATOR   CYSTOSCOPY W/ URETERAL STENT PLACEMENT Right 09/06/2013   Procedure: CYSTOSCOPY WITH RIGHT RETROGRADE  PYELOGRAM; RIGHT URETERAL STENT PLACEMENT;  Surgeon: [name], MD;  Location: [facility];  Service: Urology;  Laterality: Right;   CYSTOSCOPY W/ URETERAL STENT PLACEMENT N/A 04/28/2016   Procedure: CYSTOSCOPY WITH  RIGHT RETROGRADE PYELOGRAM/RIGHT URETERAL STENT PLACEMENT;  Surgeon: Rana Snare, MD;  Location: [facility];  Service: Urology;  Laterality: N/A;   CYSTOSCOPY W/ URETERAL STENT REMOVAL Right 07/21/2016   Procedure: CYSTOSCOPY WITH STENT REMOVAL;  Surgeon: Franchot Gallo, MD;  Location: [facility];  Service: Urology;  Laterality: Right;   CYSTOSCOPY/URETEROSCOPY/HOLMIUM LASER/STENT PLACEMENT Right 07/21/2016   Procedure: CYSTOSCOPY/URETEROSCOPY/HOLMIUM LASER/   right retrograde pylegram;  Surgeon: Franchot Gallo, MD;  Location: [facility];  Service: Urology;  Laterality: Right;   endovenous laser ablation and stab phlebectomies Right 08/01/2020   EVLA of right greater saphenous vein and > 20 stab phlebectomies   TONSILLECTOMY     TUBAL LIGATION      ROS- all systems are reviewed and negative except as per HPI above  Current Outpatient Medications  Medication Sig Dispense Refill   acetaminophen (TYLENOL) 500 MG tablet Take 1,000 mg by mouth every 6 (six) hours as needed for headache.      carvedilol (COREG) 25 MG tablet TAKE 1 TABLET BY MOUTH TWICE DAILY WITH A MEAL 180 tablet 3   Coenzyme Q10 (CO Q 10) 100 MG CAPS Take 100 mg by mouth daily.      diphenhydrAMINE (BENADRYL) 25 MG tablet Take 25 mg by  mouth every 6 (six) hours as needed (headache).      ELIQUIS 5 MG TABS tablet Take 1 tablet by mouth twice daily 180 tablet 3   FARXIGA 10 MG TABS tablet TAKE 1 TABLET BY MOUTH ONCE DAILY BEFORE BREAKFAST 90 tablet 3   furosemide (LASIX) 20 MG tablet Take 1 tablet (20 mg total) by mouth daily. 90 tablet 3   hydrALAZINE (APRESOLINE) 100 MG tablet Take 1 tablet (100 mg total) by mouth 3 (three) times daily. 270 tablet 3   isosorbide mononitrate (IMDUR) 30 MG 24 hr tablet Take 3 tablets (90 mg  total) by mouth daily. 270 tablet 6   potassium chloride (KLOR-CON) 10 MEQ tablet Take 1 tablet by mouth once daily 90 tablet 3   spironolactone (ALDACTONE) 50 MG tablet Take 1 tablet by mouth once daily 90 tablet 3   No current facility-administered medications for this visit.    Physical Exam: There were no vitals filed for this visit.   GEN- The patient is well appearing, alert and oriented x 3 today.   Head- normocephalic, atraumatic Eyes-  Sclera clear, conjunctiva pink Lungs- Clear to ausculation bilaterally, normal work of breathing Chest- ICD pocket is well healed Heart- Regular rate and rhythm, no murmurs, rubs or gallops, PMI not laterally displaced Extremities- no clubbing, cyanosis, or edema  ICD interrogation- reviewed in detail today,  See PACEART report  ekg tracing ordered today is personally reviewed and shows sinus rhythm, narrow QRS  TTE 10/14/2021   LVEF 35-40% Wt Readings from Last 3 Encounters:  07/28/22 172 lb 6.4 oz (78.2 kg)  08/27/21 173 lb 3.2 oz (78.6 kg)  08/02/21 177 lb 9.6 oz (80.6 kg)    Assessment and Plan:  1.  Chronic systolic dysfunction/ nonischemic CM euvolemic today Stable on an appropriate medical regimen Normal ICD function - approaching ERI. We briefly discussed the procedure and will monitor closely until she triggers ERI. Will shcedule Gen change at that point. See Claudia Desanctis Art report No changes today she is not device dependant today followed in ICM device clinic TTE ordered for tomorrow   2. Remote atypical atrial flutter No arrhythmias in past 2 years off AAD therapy Chads2vasc score is 3.  She is on eliquis  3. HTN Stable No change required today  Return in a year  Melida Quitter, MD  08/05/2022 1:58 PM

## 2022-08-05 NOTE — Patient Instructions (Signed)
Medication Instructions:  Your physician recommends that you continue on your current medications as directed. Please refer to the Current Medication list given to you today.   Labwork: None  Testing/Procedures: None  Follow-Up: Follow up with Dr. Orrin Brigham in 1 year.   Any Other Special Instructions Will Be Listed Below (If Applicable).     If you need a refill on your cardiac medications before your next appointment, please call your pharmacy.

## 2022-08-06 ENCOUNTER — Ambulatory Visit (HOSPITAL_COMMUNITY)
Admission: RE | Admit: 2022-08-06 | Discharge: 2022-08-06 | Disposition: A | Discharge status: Home / Self Care | Payer: Medicare Other | Source: Ambulatory Visit | Attending: Cardiology | Admitting: Cardiology

## 2022-08-06 DIAGNOSIS — I5022 Chronic systolic (congestive) heart failure: Secondary | ICD-10-CM | POA: Insufficient documentation

## 2022-08-06 LAB — ECHOCARDIOGRAM COMPLETE
AR max vel: 2.2 cm2
AV Area VTI: 2.31 cm2
AV Area mean vel: 1.92 cm2
AV Mean grad: 4 mmHg
AV Peak grad: 7.1 mmHg
Ao pk vel: 1.33 m/s
Area-P 1/2: 3.08 cm2
Calc EF: 42.7 %
MV VTI: 2.92 cm2
S' Lateral: 4.9 cm
Single Plane A2C EF: 47.1 %
Single Plane A4C EF: 36.6 %

## 2022-08-11 ENCOUNTER — Ambulatory Visit (INDEPENDENT_AMBULATORY_CARE_PROVIDER_SITE_OTHER): Payer: Medicare Other

## 2022-08-11 DIAGNOSIS — Z95810 Presence of automatic (implantable) cardiac defibrillator: Secondary | ICD-10-CM

## 2022-08-11 DIAGNOSIS — I5022 Chronic systolic (congestive) heart failure: Secondary | ICD-10-CM | POA: Diagnosis not present

## 2022-08-12 NOTE — Progress Notes (Signed)
EPIC Encounter for ICM Monitoring  Patient Name: Anita Warren is a 61 y.o. female Date: 08/12/2022 Primary Care Physican: Redmond School, MD Primary Cardiologist: [name] Electrophysiologist: Mealor 05/02/2022 Weight: 168 lbs 06/04/2022 Weight: 168 lbs 08/12/2022 Weight: 172 lbs   Battery Longevity: 3.8 months                                                            Spoke with patient and heart failure questions reviewed.  Pt reports she knew she had some fluid last week and cut back on fluid intake which was maybe too limiting.     Coruve thoracic impedance suggesting fluid accumulation from 10/4-10/13 followed by possible dryness 10/14.    Prescribed:  Furosemide 20 mg take 1 tablet (20 mg total) daily.   Potassium 10 mEq take 1 tablet daily. Spironolactone 50 mg take 1 tablet daily     Labs: 08/27/2021 Creatinine 1.56, BUN 24, Potassium 3.7, Sodium 137, GFR 38 A complete set of results can be found in Results Review.   Recommendations:  Advised to be consistent with fluid intake of approximately 64 oz daily.   Follow-up plan: ICM clinic phone appointment on 08/18/2022 to recheck fluid levels.   91 day device clinic remote transmission 10/02/2022.      EP/Cardiology Office Visits:  Recall 08/05/2023 with Dr Myles Gip.  Recall 01/24/2023 with Dr [name].   Copy of ICM check sent to Dr. Myles Gip.    3 month ICM trend: 08/11/2022.    12-14 Month ICM trend:     Rosalene Billings, RN 08/12/2022 2:34 PM

## 2022-08-18 ENCOUNTER — Ambulatory Visit (INDEPENDENT_AMBULATORY_CARE_PROVIDER_SITE_OTHER): Payer: Medicare Other

## 2022-08-18 DIAGNOSIS — Z95810 Presence of automatic (implantable) cardiac defibrillator: Secondary | ICD-10-CM

## 2022-08-18 DIAGNOSIS — I5022 Chronic systolic (congestive) heart failure: Secondary | ICD-10-CM

## 2022-08-18 NOTE — Progress Notes (Signed)
EPIC Encounter for ICM Monitoring  Patient Name: Anita Warren is a 61 y.o. female Date: 08/18/2022 Primary Care Physican: Redmond School, MD Primary Cardiologist: [name] Electrophysiologist: Mealor 05/02/2022 Weight: 168 lbs 06/04/2022 Weight: 168 lbs 08/12/2022 Weight: 172 lbs   Battery Longevity: 3.8 months                                                            Spoke with patient and heart failure questions reviewed.  Transmission results reviewed.  Pt asymptomatic for fluid accumulation.  Reports feeling well at this time and voices no complaints.     Coruve thoracic impedance suggesting fluid levels returned to normal on 10/20.    Prescribed:  Furosemide 20 mg take 1 tablet (20 mg total) daily.   Potassium 10 mEq take 1 tablet daily. Spironolactone 50 mg take 1 tablet daily     Labs: 08/27/2021 Creatinine 1.56, BUN 24, Potassium 3.7, Sodium 137, GFR 38 A complete set of results can be found in Results Review.   Recommendations:  No changes and encouraged to call if experiencing any fluid symptoms.   Follow-up plan: ICM clinic phone appointment on 09/22/2022.   91 day device clinic remote transmission 10/02/2022.      EP/Cardiology Office Visits:  Recall 08/05/2023 with Dr Myles Gip.  Recall 01/24/2023 with Dr [name].   Copy of ICM check sent to Dr. Myles Gip.     3 month ICM trend: 08/15/2022.    Rosalene Billings, RN 08/18/2022 8:47 AM

## 2022-08-29 LAB — CUP PACEART REMOTE DEVICE CHECK
Battery Remaining Longevity: 4 mo
Battery Remaining Percentage: 3 %
Battery Voltage: 2.6 V
Brady Statistic RV Percent Paced: 1 %
Date Time Interrogation Session: 20231103044752
HighPow Impedance: 40 Ohm
Implantable Lead Connection Status: 753985
Implantable Lead Implant Date: 20120511
Implantable Lead Location: 753860
Implantable Lead Model: 7120
Implantable Pulse Generator Implant Date: 20120511
Lead Channel Impedance Value: 490 Ohm
Lead Channel Pacing Threshold Amplitude: 0.75 V
Lead Channel Pacing Threshold Pulse Width: 0.5 ms
Lead Channel Sensing Intrinsic Amplitude: 10.7 mV
Lead Channel Setting Pacing Amplitude: 2.5 V
Lead Channel Setting Pacing Pulse Width: 0.5 ms
Lead Channel Setting Sensing Sensitivity: 0.5 mV
Pulse Gen Serial Number: 634653

## 2022-08-31 ENCOUNTER — Other Ambulatory Visit (HOSPITAL_COMMUNITY): Payer: Self-pay | Admitting: Cardiology

## 2022-08-31 DIAGNOSIS — I5022 Chronic systolic (congestive) heart failure: Secondary | ICD-10-CM

## 2022-09-01 ENCOUNTER — Ambulatory Visit (INDEPENDENT_AMBULATORY_CARE_PROVIDER_SITE_OTHER): Payer: Medicare Other

## 2022-09-01 DIAGNOSIS — I428 Other cardiomyopathies: Secondary | ICD-10-CM

## 2022-09-02 ENCOUNTER — Other Ambulatory Visit (HOSPITAL_COMMUNITY): Payer: Self-pay | Admitting: Cardiology

## 2022-09-14 ENCOUNTER — Other Ambulatory Visit (HOSPITAL_COMMUNITY): Payer: Self-pay | Admitting: Cardiology

## 2022-09-22 ENCOUNTER — Ambulatory Visit (INDEPENDENT_AMBULATORY_CARE_PROVIDER_SITE_OTHER): Payer: Medicare Other

## 2022-09-22 DIAGNOSIS — Z95810 Presence of automatic (implantable) cardiac defibrillator: Secondary | ICD-10-CM | POA: Diagnosis not present

## 2022-09-22 DIAGNOSIS — I5022 Chronic systolic (congestive) heart failure: Secondary | ICD-10-CM

## 2022-09-23 ENCOUNTER — Other Ambulatory Visit (HOSPITAL_COMMUNITY): Payer: Self-pay | Admitting: Cardiology

## 2022-09-24 NOTE — Progress Notes (Signed)
EPIC Encounter for ICM Monitoring  Patient Name: Anita Warren is a 61 y.o. female Date: 09/24/2022 Primary Care Physican: Redmond School, MD Primary Cardiologist: [name] Electrophysiologist: Mealor 05/02/2022 Weight: 168 lbs 06/04/2022 Weight: 168 lbs 08/12/2022 Weight: 172 lbs   Battery Longevity: 3.8 months                                                            Spoke with patient and heart failure questions reviewed.  Transmission results reviewed.  Pt asymptomatic for fluid accumulation.  Reports feeling well at this time and voices no complaints.      Coruve thoracic impedance suggesting normal fluid levels.    Prescribed:  Furosemide 20 mg take 1 tablet (20 mg total) daily.   Potassium 10 mEq take 1 tablet daily. Spironolactone 50 mg take 1 tablet daily     Labs: 07/28/2022 Creatinine 1.56, BUN 25, Potassium 4.5, Sodium 137, GFR 38 A complete set of results can be found in Results Review.   Recommendations:  No changes and encouraged to call if experiencing any fluid symptoms.   Follow-up plan: ICM clinic phone appointment on 11/03/2022.   91 day device clinic remote transmission 10/02/2022.      EP/Cardiology Office Visits:  Recall 08/05/2023 with Dr Myles Gip.  Recall 01/24/2023 with Dr [name].   Copy of ICM check sent to Dr. Myles Gip.      3 month ICM trend: 09/22/2022.    12-14 Month ICM trend:     Rosalene Billings, RN 09/24/2022 7:44 AM

## 2022-09-28 ENCOUNTER — Other Ambulatory Visit (HOSPITAL_COMMUNITY): Payer: Self-pay | Admitting: Cardiology

## 2022-09-30 NOTE — Progress Notes (Signed)
Remote ICD transmission.   

## 2022-10-02 ENCOUNTER — Ambulatory Visit (INDEPENDENT_AMBULATORY_CARE_PROVIDER_SITE_OTHER): Payer: Medicare Other

## 2022-10-02 DIAGNOSIS — I428 Other cardiomyopathies: Secondary | ICD-10-CM | POA: Diagnosis not present

## 2022-10-03 LAB — CUP PACEART REMOTE DEVICE CHECK
Battery Remaining Longevity: 4 mo
Battery Remaining Percentage: 3 %
Battery Voltage: 2.6 V
Brady Statistic RV Percent Paced: 1 %
Date Time Interrogation Session: 20231208175311
HighPow Impedance: 39 Ohm
Implantable Lead Connection Status: 753985
Implantable Lead Implant Date: 20120511
Implantable Lead Location: 753860
Implantable Lead Model: 7120
Implantable Pulse Generator Implant Date: 20120511
Lead Channel Impedance Value: 490 Ohm
Lead Channel Pacing Threshold Amplitude: 0.75 V
Lead Channel Pacing Threshold Pulse Width: 0.5 ms
Lead Channel Sensing Intrinsic Amplitude: 10.8 mV
Lead Channel Setting Pacing Amplitude: 2.5 V
Lead Channel Setting Pacing Pulse Width: 0.5 ms
Lead Channel Setting Sensing Sensitivity: 0.5 mV
Pulse Gen Serial Number: 634653

## 2022-10-14 ENCOUNTER — Telehealth: Payer: Self-pay

## 2022-10-14 DIAGNOSIS — Z95810 Presence of automatic (implantable) cardiac defibrillator: Secondary | ICD-10-CM

## 2022-10-14 NOTE — Telephone Encounter (Signed)
Patient called in wanting to know the status of her battery on her device

## 2022-10-14 NOTE — Telephone Encounter (Signed)
Patient reports her device buzzed today. Requested patient send a remote transmission

## 2022-10-15 NOTE — Telephone Encounter (Signed)
Left message for patient to call back  

## 2022-10-15 NOTE — Telephone Encounter (Signed)
Received transmission:  Patient reached ERI on 10/14/22.  She was seen by Dr. Myles Gip on 08/05/22 in office. He addressed gen change at that visit.   Will forward to Dr. Karel Jarvis nurse Carly to coordinate gen change with patient.   Notified patient of ERI status being the cause of her beep.  She is getting married on 1/24. She is aware that industry guarantees 71month once reaching ERI.   Should be able to schedule around wedding festivities.

## 2022-10-15 NOTE — Telephone Encounter (Signed)
Spoke with the patient and scheduled her for a generator change on 2/9.

## 2022-10-15 NOTE — Telephone Encounter (Signed)
Patient is returning call.  °

## 2022-10-23 ENCOUNTER — Ambulatory Visit (HOSPITAL_COMMUNITY)
Admission: RE | Admit: 2022-10-23 | Discharge: 2022-10-23 | Disposition: A | Discharge status: Home / Self Care | Payer: Medicare Other | Source: Ambulatory Visit | Attending: Internal Medicine | Admitting: Internal Medicine

## 2022-10-23 ENCOUNTER — Other Ambulatory Visit (HOSPITAL_COMMUNITY): Payer: Self-pay | Admitting: Internal Medicine

## 2022-10-23 DIAGNOSIS — Z1231 Encounter for screening mammogram for malignant neoplasm of breast: Secondary | ICD-10-CM

## 2022-10-24 NOTE — Progress Notes (Signed)
Remote ICD transmission.   

## 2022-10-29 ENCOUNTER — Other Ambulatory Visit (HOSPITAL_COMMUNITY): Payer: Self-pay | Admitting: Internal Medicine

## 2022-10-29 DIAGNOSIS — R928 Other abnormal and inconclusive findings on diagnostic imaging of breast: Secondary | ICD-10-CM

## 2022-11-03 ENCOUNTER — Ambulatory Visit (INDEPENDENT_AMBULATORY_CARE_PROVIDER_SITE_OTHER): Payer: Medicare Other

## 2022-11-03 DIAGNOSIS — Z95810 Presence of automatic (implantable) cardiac defibrillator: Secondary | ICD-10-CM

## 2022-11-03 DIAGNOSIS — I5022 Chronic systolic (congestive) heart failure: Secondary | ICD-10-CM | POA: Diagnosis not present

## 2022-11-03 DIAGNOSIS — I428 Other cardiomyopathies: Secondary | ICD-10-CM

## 2022-11-05 LAB — CUP PACEART REMOTE DEVICE CHECK
Battery Remaining Longevity: 0 mo
Battery Voltage: 2.59 V
Brady Statistic RV Percent Paced: 1 %
Date Time Interrogation Session: 20240110080944
HighPow Impedance: 39 Ohm
Implantable Lead Connection Status: 753985
Implantable Lead Implant Date: 20120511
Implantable Lead Location: 753860
Implantable Lead Model: 7120
Implantable Pulse Generator Implant Date: 20120511
Lead Channel Impedance Value: 490 Ohm
Lead Channel Pacing Threshold Amplitude: 0.75 V
Lead Channel Pacing Threshold Pulse Width: 0.5 ms
Lead Channel Sensing Intrinsic Amplitude: 11.5 mV
Lead Channel Setting Pacing Amplitude: 2.5 V
Lead Channel Setting Pacing Pulse Width: 0.5 ms
Lead Channel Setting Sensing Sensitivity: 0.5 mV
Pulse Gen Serial Number: 634653

## 2022-11-05 NOTE — Progress Notes (Signed)
EPIC Encounter for ICM Monitoring  Patient Name: Anita Warren is a 62 y.o. female Date: 11/05/2022 Primary Care Physican: Redmond School, MD Primary Cardiologist: [name] Electrophysiologist: Mealor 05/02/2022 Weight: 168 lbs 06/04/2022 Weight: 168 lbs 08/12/2022 Weight: 172 lbs                                                            Spoke with patient and heart failure questions reviewed.  Transmission results reviewed.  Pt asymptomatic for fluid accumulation.  She was married on [place] Years Eve and last name will be changed to [name].  Discussed ICD change scheduled 2/9 and will take about 5 weeks for Fluid levels to record baseline.        Coruve thoracic impedance suggesting normal fluid levels with exception of possible fluid accumulation 12/25-12/29 and 12/7-12/12.     Prescribed:  Furosemide 20 mg take 1 tablet (20 mg total) daily.   Potassium 10 mEq take 1 tablet daily. Spironolactone 50 mg take 1 tablet daily     Labs: 07/28/2022 Creatinine 1.56, BUN 25, Potassium 4.5, Sodium 137, GFR 38 A complete set of results can be found in Results Review.   Recommendations:  Advised to monitor for fluid symptoms and call if symptoms develop.    Follow-up plan: ICM clinic phone appointment on 01/06/2023.   91 day device clinic remote transmission 01/05/2023.      EP/Cardiology Office Visits:  Recall 08/05/2023 with Dr Myles Gip.  Recall 01/24/2023 with Dr [name].   Copy of ICM check sent to Dr. Myles Gip.      3 month ICM trend: 11/05/2022.    12-14 Month ICM trend:     Anita Billings, RN 11/05/2022 12:24 PM

## 2022-11-13 ENCOUNTER — Other Ambulatory Visit (HOSPITAL_COMMUNITY): Payer: Self-pay | Admitting: Internal Medicine

## 2022-11-13 ENCOUNTER — Ambulatory Visit (HOSPITAL_COMMUNITY)
Admission: RE | Admit: 2022-11-13 | Discharge: 2022-11-13 | Disposition: A | Discharge status: Home / Self Care | Payer: 59 | Source: Ambulatory Visit | Attending: Internal Medicine | Admitting: Internal Medicine

## 2022-11-13 ENCOUNTER — Encounter (HOSPITAL_COMMUNITY): Payer: Self-pay

## 2022-11-13 DIAGNOSIS — N6311 Unspecified lump in the right breast, upper outer quadrant: Secondary | ICD-10-CM | POA: Diagnosis not present

## 2022-11-13 DIAGNOSIS — R928 Other abnormal and inconclusive findings on diagnostic imaging of breast: Secondary | ICD-10-CM

## 2022-11-18 ENCOUNTER — Ambulatory Visit (HOSPITAL_COMMUNITY)
Admission: RE | Admit: 2022-11-18 | Discharge: 2022-11-18 | Disposition: A | Discharge status: Home / Self Care | Payer: 59 | Source: Ambulatory Visit | Attending: Internal Medicine | Admitting: Internal Medicine

## 2022-11-18 ENCOUNTER — Other Ambulatory Visit (HOSPITAL_COMMUNITY): Payer: Self-pay | Admitting: Internal Medicine

## 2022-11-18 DIAGNOSIS — R928 Other abnormal and inconclusive findings on diagnostic imaging of breast: Secondary | ICD-10-CM

## 2022-11-18 DIAGNOSIS — D241 Benign neoplasm of right breast: Secondary | ICD-10-CM | POA: Diagnosis not present

## 2022-11-18 DIAGNOSIS — N6311 Unspecified lump in the right breast, upper outer quadrant: Secondary | ICD-10-CM | POA: Diagnosis not present

## 2022-11-18 HISTORY — PX: BREAST BIOPSY: SHX20

## 2022-11-18 MED ORDER — LIDOCAINE-EPINEPHRINE (PF) 1 %-1:200000 IJ SOLN
INTRAMUSCULAR | Status: AC
  Filled 2022-11-18: qty 30

## 2022-11-18 MED ORDER — LIDOCAINE HCL (PF) 2 % IJ SOLN
INTRAMUSCULAR | Status: AC
  Filled 2022-11-18: qty 10

## 2022-11-19 ENCOUNTER — Other Ambulatory Visit (HOSPITAL_COMMUNITY): Payer: Self-pay | Admitting: Cardiology

## 2022-11-19 LAB — SURGICAL PATHOLOGY

## 2022-11-25 ENCOUNTER — Other Ambulatory Visit (HOSPITAL_COMMUNITY): Payer: Self-pay | Admitting: Cardiology

## 2022-11-25 DIAGNOSIS — I5022 Chronic systolic (congestive) heart failure: Secondary | ICD-10-CM

## 2022-12-03 ENCOUNTER — Other Ambulatory Visit (HOSPITAL_COMMUNITY): Payer: Self-pay | Admitting: *Deleted

## 2022-12-03 MED ORDER — APIXABAN 5 MG PO TABS: 5.0000 mg | ORAL_TABLET | Freq: Two times a day (BID) | ORAL | 3 refills | Status: DC

## 2022-12-03 MED ORDER — DAPAGLIFLOZIN PROPANEDIOL 10 MG PO TABS: 10.0000 mg | ORAL_TABLET | Freq: Every day | ORAL | 3 refills | Status: DC

## 2022-12-04 ENCOUNTER — Encounter: Payer: Self-pay | Admitting: General Surgery

## 2022-12-04 ENCOUNTER — Other Ambulatory Visit (HOSPITAL_COMMUNITY)
Admission: RE | Admit: 2022-12-04 | Discharge: 2022-12-04 | Disposition: A | Discharge status: Home / Self Care | Payer: 59 | Source: Ambulatory Visit | Attending: Cardiovascular Disease | Admitting: Cardiovascular Disease

## 2022-12-04 ENCOUNTER — Telehealth: Payer: Self-pay | Admitting: Cardiovascular Disease

## 2022-12-04 ENCOUNTER — Ambulatory Visit: Payer: 59 | Admitting: General Surgery

## 2022-12-04 VITALS — BP 107/60 | HR 78 | Temp 98.3°F | Resp 14 | Ht 64.0 in | Wt 178.0 lb

## 2022-12-04 DIAGNOSIS — D241 Benign neoplasm of right breast: Secondary | ICD-10-CM

## 2022-12-04 DIAGNOSIS — I428 Other cardiomyopathies: Secondary | ICD-10-CM

## 2022-12-04 LAB — CUP PACEART REMOTE DEVICE CHECK
Battery Remaining Longevity: 0 mo
Battery Voltage: 2.59 V
Brady Statistic RV Percent Paced: 1 %
Date Time Interrogation Session: 20240208122532
HighPow Impedance: 36 Ohm
Implantable Lead Connection Status: 753985
Implantable Lead Implant Date: 20120511
Implantable Lead Location: 753860
Implantable Lead Model: 7120
Implantable Pulse Generator Implant Date: 20120511
Lead Channel Impedance Value: 450 Ohm
Lead Channel Pacing Threshold Amplitude: 0.75 V
Lead Channel Pacing Threshold Pulse Width: 0.5 ms
Lead Channel Sensing Intrinsic Amplitude: 11.3 mV
Lead Channel Setting Pacing Amplitude: 2.5 V
Lead Channel Setting Pacing Pulse Width: 0.5 ms
Lead Channel Setting Sensing Sensitivity: 0.5 mV
Pulse Gen Serial Number: 634653

## 2022-12-04 LAB — CBC
HCT: 32 % — ABNORMAL LOW (ref 36.0–46.0)
Hemoglobin: 10.3 g/dL — ABNORMAL LOW (ref 12.0–15.0)
MCH: 29.9 pg (ref 26.0–34.0)
MCHC: 32.2 g/dL (ref 30.0–36.0)
MCV: 92.8 fL (ref 80.0–100.0)
Platelets: 163 10*3/uL (ref 150–400)
RBC: 3.45 MIL/uL — ABNORMAL LOW (ref 3.87–5.11)
RDW: 15.2 % (ref 11.5–15.5)
WBC: 5.2 10*3/uL (ref 4.0–10.5)
nRBC: 0 % (ref 0.0–0.2)

## 2022-12-04 LAB — BASIC METABOLIC PANEL
Anion gap: 8 (ref 5–15)
BUN: 25 mg/dL — ABNORMAL HIGH (ref 8–23)
CO2: 24 mmol/L (ref 22–32)
Calcium: 8.7 mg/dL — ABNORMAL LOW (ref 8.9–10.3)
Chloride: 106 mmol/L (ref 98–111)
Creatinine, Ser: 1.5 mg/dL — ABNORMAL HIGH (ref 0.44–1.00)
GFR, Estimated: 39 mL/min — ABNORMAL LOW (ref >60)
Glucose, Bld: 108 mg/dL — ABNORMAL HIGH (ref 70–99)
Potassium: 3.6 mmol/L (ref 3.5–5.1)
Sodium: 138 mmol/L (ref 135–145)

## 2022-12-04 NOTE — Telephone Encounter (Signed)
Patient calling with questions concerning her procedure on tomorrow. She said she needs the stuff she is suppose to wash wife. Please advise

## 2022-12-04 NOTE — Telephone Encounter (Signed)
Pt states that she never received her Instruction letter. She ran out of Eliquis and missed 2 days but took it last night and this morning. She also has not had her labs done.   After speaking with Dr. Myles Gip, he agreed to continue with the procedure tomorrow as long as she does not take anymore Eliquis (she will have been holding x 24 hours). She will go by [facility] now and get her labs done.   She understands her arrival time and all other medication instructions.

## 2022-12-04 NOTE — Pre-Procedure Instructions (Signed)
Instructed patient on the following items: Arrival time 1330 Nothing to eat or drink after midnight No meds AM of procedure Responsible person to drive you home and stay with you for 24 hrs Wash with special soap night before and morning of procedure If on anti-coagulant drug instructions Eliquis- took dose this am, Dr Myles Gip is aware.  Will not take tonight's dose or dose in am.

## 2022-12-05 ENCOUNTER — Other Ambulatory Visit: Payer: Self-pay

## 2022-12-05 ENCOUNTER — Encounter (HOSPITAL_COMMUNITY)
Admission: RE | Disposition: A | Discharge status: Home / Self Care | Payer: Self-pay | Source: Home / Self Care | Attending: Cardiovascular Disease

## 2022-12-05 ENCOUNTER — Ambulatory Visit (HOSPITAL_COMMUNITY)
Admission: RE | Admit: 2022-12-05 | Discharge: 2022-12-05 | Disposition: A | Discharge status: Home / Self Care | Payer: 59 | Attending: Cardiovascular Disease | Admitting: Cardiovascular Disease

## 2022-12-05 DIAGNOSIS — Z4502 Encounter for adjustment and management of automatic implantable cardiac defibrillator: Secondary | ICD-10-CM | POA: Insufficient documentation

## 2022-12-05 HISTORY — PX: ICD GENERATOR CHANGEOUT: EP1231

## 2022-12-05 SURGERY — ICD GENERATOR CHANGEOUT

## 2022-12-05 MED ORDER — CEFAZOLIN SODIUM-DEXTROSE 2-4 GM/100ML-% IV SOLN
INTRAVENOUS | Status: AC
  Filled 2022-12-05: qty 100

## 2022-12-05 MED ORDER — CEFAZOLIN SODIUM-DEXTROSE 2-4 GM/100ML-% IV SOLN
2.0000 g | INTRAVENOUS | Status: AC
  Administered 2022-12-05: 2 g via INTRAVENOUS

## 2022-12-05 MED ORDER — ONDANSETRON HCL 4 MG/2ML IJ SOLN: 4.0000 mg | Freq: Four times a day (QID) | INTRAMUSCULAR | Status: DC | PRN

## 2022-12-05 MED ORDER — SODIUM CHLORIDE 0.9 % IV SOLN
80.0000 mg | INTRAVENOUS | Status: AC
  Administered 2022-12-05: 80 mg

## 2022-12-05 MED ORDER — MIDAZOLAM HCL 5 MG/5ML IJ SOLN
INTRAMUSCULAR | Status: DC | PRN
  Administered 2022-12-05 (×2): 1 mg via INTRAVENOUS

## 2022-12-05 MED ORDER — LIDOCAINE HCL (PF) 1 % IJ SOLN
INTRAMUSCULAR | Status: AC
  Filled 2022-12-05: qty 60

## 2022-12-05 MED ORDER — SODIUM CHLORIDE 0.9 % IV SOLN: INTRAVENOUS | Status: DC

## 2022-12-05 MED ORDER — POVIDONE-IODINE 10 % EX SWAB
2.0000 | Freq: Once | CUTANEOUS | Status: AC
  Administered 2022-12-05: 2 via TOPICAL

## 2022-12-05 MED ORDER — FENTANYL CITRATE (PF) 100 MCG/2ML IJ SOLN
INTRAMUSCULAR | Status: DC | PRN
  Administered 2022-12-05 (×2): 25 ug via INTRAVENOUS

## 2022-12-05 MED ORDER — ACETAMINOPHEN 325 MG PO TABS: 325.0000 mg | ORAL_TABLET | ORAL | Status: DC | PRN

## 2022-12-05 MED ORDER — SODIUM CHLORIDE 0.9 % IV SOLN
INTRAVENOUS | Status: AC
  Filled 2022-12-05: qty 2

## 2022-12-05 MED ORDER — LIDOCAINE HCL (PF) 1 % IJ SOLN
INTRAMUSCULAR | Status: DC | PRN
  Administered 2022-12-05: 60 mL

## 2022-12-05 MED ORDER — MIDAZOLAM HCL 5 MG/5ML IJ SOLN
INTRAMUSCULAR | Status: AC
  Filled 2022-12-05: qty 5

## 2022-12-05 MED ORDER — FENTANYL CITRATE (PF) 100 MCG/2ML IJ SOLN
INTRAMUSCULAR | Status: AC
  Filled 2022-12-05: qty 2

## 2022-12-05 SURGICAL SUPPLY — 6 items
CABLE SURGICAL S-101-97-12 (CABLE) ×1 IMPLANT
ICD GALLANT VR CDVRA500Q (ICD Generator) IMPLANT
PAD DEFIB RADIO PHYSIO CONN (PAD) ×1 IMPLANT
POUCH AIGIS-R ANTIBACT PPM (Mesh General) ×1 IMPLANT
POUCH AIGIS-R ANTIBACT PPM MED (Mesh General) IMPLANT
TRAY PACEMAKER INSERTION (PACKS) ×1 IMPLANT

## 2022-12-05 NOTE — Discharge Instructions (Signed)

## 2022-12-05 NOTE — H&P (Signed)
PCP: Redmond School, MD Primary Cardiologist: Dr [name] Primary EP: Dr Rayann Heman  Anita Warren is a 62 y.o. female who presents today for routine electrophysiology followup.    She was last seen by Dr. Rayann Heman a year ago.  Since last being seen in our clinic, the patient reports doing very well.  Today, she denies symptoms of palpitations, chest pain, shortness of breath,  lower extremity edema, dizziness, presyncope, syncope, or ICD shocks.  The patient is otherwise without complaint today.   She presents today for generator change. She did take eliquis yesterday AM.  Past Medical History:  Diagnosis Date   AICD (automatic cardioverter/defibrillator) present    St. Jude AICD implanted 03-07-2011 Dr. Allred/-Dr. [name] now follows   CHF (congestive heart failure) ([place])    meds controlling, no episodes since 123456   Chronic systolic heart failure ([place])    Essential hypertension, benign    Headache    History of kidney stones    multiple kidney stones in past   History of medication noncompliance    Hydronephrosis with renal and ureteral calculus obstruction 09/05/2013   Nonischemic cardiomyopathy ([place])    LVEF 5-10%, likely viral (no CAD by cath 01/30/11)   NSVT (nonsustained ventricular tachycardia) ([place])    Obesity    Past Surgical History:  Procedure Laterality Date   ABDOMINAL HYSTERECTOMY     BREAST BIOPSY Left 10/19/2019   Procedure: BREAST BIOPSY WITH NEEDLE LOCALIZATION;  Surgeon: Aviva Signs, MD;  Location: [facility];  Service: General;  Laterality: Left;   BREAST BIOPSY Right 11/18/2022   [place] RT BREAST BX W LOC DEV 1ST LESION IMG BX [place] US GUIDE 11/18/2022 [facility]   BREAST LUMPECTOMY  1989   L breast- benign   CARDIAC CATHETERIZATION     CARDIAC DEFIBRILLATOR PLACEMENT  5/12   SJM by JA   CESAREAN SECTION     x 2   CHOLECYSTECTOMY     COLONOSCOPY  07/02/2012   Procedure: COLONOSCOPY;  Surgeon: [name], MD;  [facility];  Service:  Endoscopy;  Laterality: N/A;  1:15/PATIENT HAS A DEFIBRILLATOR   CYSTOSCOPY W/ URETERAL STENT PLACEMENT Right 09/06/2013   Procedure: CYSTOSCOPY WITH RIGHT RETROGRADE PYELOGRAM; RIGHT URETERAL STENT PLACEMENT;  Surgeon: [name], MD;  Location: [facility];  Service: Urology;  Laterality: Right;   CYSTOSCOPY W/ URETERAL STENT PLACEMENT N/A 04/28/2016   Procedure: CYSTOSCOPY WITH  RIGHT RETROGRADE PYELOGRAM/RIGHT URETERAL STENT PLACEMENT;  Surgeon: Rana Snare, MD;  [facility];  Service: Urology;  Laterality: N/A;   CYSTOSCOPY W/ URETERAL STENT REMOVAL Right 07/21/2016   Procedure: CYSTOSCOPY WITH STENT REMOVAL;  Surgeon: Franchot Gallo, MD;  [facility];  Service: Urology;  Laterality: Right;   CYSTOSCOPY/URETEROSCOPY/HOLMIUM LASER/STENT PLACEMENT Right 07/21/2016   Procedure: CYSTOSCOPY/URETEROSCOPY/HOLMIUM LASER/   right retrograde pylegram;  Surgeon: Franchot Gallo, MD;  [facility];  Service: Urology;  Laterality: Right;   endovenous laser ablation and stab phlebectomies Right 08/01/2020   EVLA of right greater saphenous vein and > 20 stab phlebectomies   TONSILLECTOMY     TUBAL LIGATION      ROS- all systems are reviewed and negative except as per HPI above  Current Facility-Administered Medications  Medication Dose Route Frequency Provider Last Rate Last Admin   0.9 %  sodium chloride infusion   Intravenous Continuous Mccoy Testa, Yetta Barre, MD 50 mL/hr at 12/05/22 1408 New Bag at 12/05/22 1408   ceFAZolin (ANCEF) IVPB 2g/100 mL premix  2 g  Intravenous On Call Benita Boonstra, Yetta Barre, MD       gentamicin (GARAMYCIN) 80 mg in sodium chloride 0.9 % 500 mL irrigation  80 mg Irrigation On Call Mahmoud Blazejewski, Yetta Barre, MD        Physical Exam: Vitals:   12/05/22 1400  BP: 117/68  Pulse: 64  Resp: 16  Temp: (!) 97.3 F (36.3 C)  TempSrc: Temporal  SpO2: 100%  Weight: 80.7 kg  Height: 5' 4"$  (1.626 m)     GEN- The patient is well appearing, alert and oriented x 3 today.    Head- normocephalic, atraumatic Eyes-  Sclera clear, conjunctiva pink Lungs- Clear to ausculation bilaterally, normal work of breathing Chest- ICD pocket is well healed Heart- Regular rate and rhythm, no murmurs, rubs or gallops, PMI not laterally displaced Extremities- no clubbing, cyanosis, or edema  ICD interrogation- reviewed in detail today,  See PACEART report   TTE 10/14/2021   LVEF 35-40% Wt Readings from Last 3 Encounters:  12/05/22 80.7 kg  12/04/22 80.7 kg  08/05/22 79.1 kg    Assessment and Plan:  ICD at ERI: will proceed with generator change today  Melida Quitter, MD  12/05/2022 3:37 PM

## 2022-12-05 NOTE — Progress Notes (Signed)
Anita Warren; [id number]; [birth year]   HPI Patient is a 62 year old black female who was referred to my care by Dr. 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  She has had previous breast biopsies on the left side which have been negative.  She does have a sister with breast cancer.  She denies feeling any lump.  She has not had any nipple discharge.  She is having her AICD battery replaced tomorrow. Past Medical History:  Diagnosis Date   AICD (automatic cardioverter/defibrillator) present    St. Jude AICD implanted 03-07-2011 Dr. Allred/-Dr. [name] now follows   CHF (congestive heart failure) ([place])    meds controlling, no episodes since [id number]   Chronic systolic heart failure ([place])    Essential hypertension, benign    Headache    History of kidney stones    multiple kidney stones in past   History of medication noncompliance    Hydronephrosis with renal and ureteral calculus obstruction 09/05/2013   Nonischemic cardiomyopathy ([place])    LVEF 5-10%, likely viral (no CAD by cath 01/30/11)   NSVT (nonsustained ventricular tachycardia) ([place])    Obesity     Past Surgical History:  Procedure Laterality Date   ABDOMINAL HYSTERECTOMY     BREAST BIOPSY Left 10/19/2019   Procedure: BREAST BIOPSY WITH NEEDLE LOCALIZATION;  Surgeon: Aviva Signs, MD;  Location: [facility];  Service: General;  Laterality: Left;   BREAST BIOPSY Right 11/18/2022   [place] RT BREAST BX W LOC DEV 1ST LESION IMG BX [place] US GUIDE 11/18/2022 [facility]-ULTRASOUND   BREAST LUMPECTOMY  1989   L breast- benign   CARDIAC CATHETERIZATION     CARDIAC DEFIBRILLATOR PLACEMENT  5/12   SJM by JA   CESAREAN SECTION     x 2   CHOLECYSTECTOMY     COLONOSCOPY  07/02/2012   Procedure: COLONOSCOPY;  Surgeon: [name], MD;  Location: [facility];  Service: Endoscopy;  Laterality: N/A;  1:15/PATIENT HAS A DEFIBRILLATOR   CYSTOSCOPY W/ URETERAL STENT PLACEMENT  Right 09/06/2013   Procedure: CYSTOSCOPY WITH RIGHT RETROGRADE PYELOGRAM; RIGHT URETERAL STENT PLACEMENT;  Surgeon: [name], MD;  Location: [facility];  Service: Urology;  Laterality: Right;   CYSTOSCOPY W/ URETERAL STENT PLACEMENT N/A 04/28/2016   Procedure: CYSTOSCOPY WITH  RIGHT RETROGRADE PYELOGRAM/RIGHT URETERAL STENT PLACEMENT;  Surgeon: Rana Snare, MD;  Location: [facility];  Service: Urology;  Laterality: N/A;   CYSTOSCOPY W/ URETERAL STENT REMOVAL Right 07/21/2016   Procedure: CYSTOSCOPY WITH STENT REMOVAL;  Surgeon: Franchot Gallo, MD;  Location: [facility];  Service: Urology;  Laterality: Right;   CYSTOSCOPY/URETEROSCOPY/HOLMIUM LASER/STENT PLACEMENT Right 07/21/2016   Procedure: CYSTOSCOPY/URETEROSCOPY/HOLMIUM LASER/   right retrograde pylegram;  Surgeon: Franchot Gallo, MD;  Location: [facility];  Service: Urology;  Laterality: Right;   endovenous laser ablation and stab phlebectomies Right 08/01/2020   EVLA of right greater saphenous vein and > 20 stab phlebectomies   TONSILLECTOMY     TUBAL LIGATION      Family History  Problem Relation Age of Onset   Diabetes Mother    Hypertension Brother    Breast cancer Sister    Diabetes Sister        borderline   Colon cancer Neg Hx     Current Outpatient Medications on File Prior to Visit  Medication Sig Dispense Refill   acetaminophen (TYLENOL) 500 MG tablet Take 1,000 mg by mouth every 6 (  six) hours as needed for headache.      apixaban (ELIQUIS) 5 MG TABS tablet Take 1 tablet (5 mg total) by mouth 2 (two) times daily. 180 tablet 3   carvedilol (COREG) 25 MG tablet TAKE 1 TABLET BY MOUTH TWICE DAILY WITH A MEAL 180 tablet 3   Coenzyme Q10 (CO Q 10) 100 MG CAPS Take 100 mg by mouth daily.      dapagliflozin propanediol (FARXIGA) 10 MG TABS tablet Take 1 tablet (10 mg total) by mouth daily before breakfast. 90 tablet 3   diphenhydrAMINE (BENADRYL) 25 MG tablet Take 25 mg by mouth every 6 (six) hours as needed (headache).       furosemide (LASIX) 20 MG tablet Take 1 tablet (20 mg total) by mouth daily. 90 tablet 3   hydrALAZINE (APRESOLINE) 100 MG tablet TAKE 1 TABLET BY MOUTH THREE TIMES DAILY 270 tablet 0   isosorbide mononitrate (IMDUR) 30 MG 24 hr tablet Take 3 tablets (90 mg total) by mouth daily. 270 tablet 6   potassium chloride (KLOR-CON) 10 MEQ tablet Take 1 tablet by mouth once daily 90 tablet 0   spironolactone (ALDACTONE) 50 MG tablet Take 1 tablet by mouth once daily 90 tablet 0   No current facility-administered medications on file prior to visit.    Allergies  Allergen Reactions   Peanut-Containing Drug Products Anaphylaxis   Ace Inhibitors Swelling   Morphine Nausea And Vomiting   Demerol Rash    Social History   Substance and Sexual Activity  Alcohol Use No    Social History   Tobacco Use  Smoking Status Never  Smokeless Tobacco Never    Review of Systems  Constitutional: Negative.   HENT: Negative.    Eyes:  Positive for blurred vision.  Respiratory: Negative.    Cardiovascular: Negative.   Gastrointestinal: Negative.   Genitourinary: Negative.   Musculoskeletal: Negative.   Skin: Negative.   Neurological: Negative.   Endo/Heme/Allergies: Negative.     Objective   Vitals:   12/04/22 0903  BP: 107/60  Pulse: 78  Resp: 14  Temp: 98.3 F (36.8 C)  SpO2: 93%    Physical Exam Vitals reviewed.  Constitutional:      Appearance: Normal appearance. She is not ill-appearing.  HENT:     Head: Normocephalic and atraumatic.  Cardiovascular:     Rate and Rhythm: Normal rate and regular rhythm.     Heart sounds: Normal heart sounds. No murmur heard.    No friction rub. No gallop.  Pulmonary:     Effort: Pulmonary effort is normal. No respiratory distress.     Breath sounds: No stridor. No wheezing, rhonchi or rales.  Lymphadenopathy:     Cervical: No cervical adenopathy.  Skin:    General: Skin is warm and dry.  Neurological:     Mental Status: She is alert and  oriented to person, place, and time.   Breast: No dominant mass, nipple discharge, or dimpling in either breast. Axillary lymphadenopathy noted.  Mammogram and pathology reports reviewed  Assessment  Intraductal papilloma, right breast AICD in place, left upper chest Plan  Will contact patient next week for radiofrequency tag placement once cleared by cardiology.  Patient will need a right breast biopsy after radiofrequency tag placement.  The risks and benefits of the procedure including bleeding and infection were fully explained to the patient, who gave informed consent.

## 2022-12-08 ENCOUNTER — Other Ambulatory Visit (HOSPITAL_COMMUNITY): Payer: Self-pay | Admitting: Cardiology

## 2022-12-08 ENCOUNTER — Encounter (HOSPITAL_COMMUNITY): Payer: Self-pay | Admitting: Cardiovascular Disease

## 2022-12-09 ENCOUNTER — Encounter: Payer: Self-pay | Admitting: *Deleted

## 2022-12-10 ENCOUNTER — Telehealth: Payer: Self-pay

## 2022-12-10 NOTE — Telephone Encounter (Signed)
Pharmacy please advise on holding Eliquis prior to excisional breast biopsy with radiofrequency tag placement scheduled for TBD. Thank you.

## 2022-12-10 NOTE — Telephone Encounter (Signed)
   Pre-operative Risk Assessment    Patient Name: Anita Warren  DOB: [birth year] MRN: [id number]      Request for Surgical Clearance    Procedure:   Excisional Breast Biopsy with Radiofrequency Tag Placement  Date of Surgery:  Clearance TBD                                 Surgeon:  Aviva Signs, MD Surgeon's Group or Practice Name:  [facility] Phone number:  [phone] Fax number:  [phone]   Type of Clearance Requested:   - Medical  - Pharmacy:  Hold Apixaban (Eliquis)     Type of Anesthesia:  General    Additional requests/questions:   Can we turn off the defibrillator or do we need the Rep in the OR?  Toma Deiters   12/10/2022, 8:10 AM

## 2022-12-10 NOTE — Progress Notes (Signed)
Remote ICD transmission.   

## 2022-12-10 NOTE — Addendum Note (Signed)
Addended by: Douglass Rivers D on: 12/10/2022 01:34 PM   Modules accepted: Level of Service

## 2022-12-11 NOTE — Telephone Encounter (Signed)
It looks like according to Dr. Karel Jarvis last note that she is not device dependent. Given recent gen change, will ask guidance from EP.  Dr. Myles Gip: Is there a certain waiting period after gen change for breast biopsy and tag placement? They are also asking if rep needed in OR or if they can turn off the ICD during surgery.  Thanks Angie

## 2022-12-11 NOTE — Telephone Encounter (Signed)
Patient with diagnosis of A Fib on Eliquis for anticoagulation.    Procedure:  Excisional Breast Biopsy with Radiofrequency Tag Placement  Date of procedure: TBD   CHA2DS2-VASc Score = 3  This indicates a 3.2% annual risk of stroke. The patient's score is based upon: CHF History: 1 HTN History: 1 Diabetes History: 0 Stroke History: 0 Vascular Disease History: 0 Age Score: 0 Gender Score: 1    CrCl 50 mL/min Platelet count 163K   Per office protocol, patient can hold Eliquis for 2 days prior to procedure.  .  **This guidance is not considered finalized until pre-operative APP has relayed final recommendations.**

## 2022-12-15 ENCOUNTER — Encounter (HOSPITAL_COMMUNITY): Payer: Self-pay | Admitting: Cardiovascular Disease

## 2022-12-17 ENCOUNTER — Other Ambulatory Visit (HOSPITAL_COMMUNITY): Payer: Self-pay | Admitting: Cardiology

## 2022-12-17 ENCOUNTER — Ambulatory Visit: Payer: 59 | Attending: Internal Medicine

## 2022-12-17 DIAGNOSIS — I5022 Chronic systolic (congestive) heart failure: Secondary | ICD-10-CM

## 2022-12-17 LAB — CUP PACEART INCLINIC DEVICE CHECK
Battery Remaining Longevity: 129 mo
Brady Statistic RV Percent Paced: 0.02 %
Date Time Interrogation Session: 20240221121826
HighPow Impedance: 39.3703
Implantable Lead Connection Status: 753985
Implantable Lead Implant Date: 20120511
Implantable Lead Location: 753860
Implantable Lead Model: 7120
Implantable Pulse Generator Implant Date: 20240209
Lead Channel Impedance Value: 512.5 Ohm
Lead Channel Pacing Threshold Amplitude: 0.75 V
Lead Channel Pacing Threshold Amplitude: 0.75 V
Lead Channel Pacing Threshold Pulse Width: 0.5 ms
Lead Channel Pacing Threshold Pulse Width: 0.5 ms
Lead Channel Sensing Intrinsic Amplitude: 12 mV
Lead Channel Setting Pacing Amplitude: 2.5 V
Lead Channel Setting Pacing Pulse Width: 0.5 ms
Lead Channel Setting Sensing Sensitivity: 0.5 mV
Pulse Gen Serial Number: 211012829

## 2022-12-17 NOTE — Patient Instructions (Signed)

## 2022-12-17 NOTE — Progress Notes (Signed)
Wound check appointment. Steri-strips removed. Wound without redness or edema. Incision edges approximated, wound well healed. Normal device function. Thresholds, sensing, and impedances consistent with implant measurements. Device programmed at chronic settings for generator change. Histogram distribution appropriate for patient and level of activity. No mode switches or ventricular arrhythmias noted. Patient educated about wound care and shock plan. ROV in 3 months with implanting physician.

## 2022-12-18 ENCOUNTER — Other Ambulatory Visit (HOSPITAL_COMMUNITY): Payer: Self-pay | Admitting: Cardiology

## 2022-12-22 ENCOUNTER — Telehealth: Payer: Self-pay

## 2022-12-22 NOTE — Telephone Encounter (Signed)
Discussed with [facility] Rep.    No action needed.  Algorithm will automatically isolate any issues with SVC coil.  High impedance is likely isolated to SVC coil.    ICD will continue to work normally.    Pt has scheduled follow up post 91 day gen change.  Will evaluate at that time.

## 2022-12-22 NOTE — Telephone Encounter (Signed)
Alert received from [employer]:  Device alert high voltage lead impedance out of range Trends show two readings >200 ohms, currently 63ohms

## 2022-12-24 ENCOUNTER — Telehealth: Payer: Self-pay

## 2022-12-24 NOTE — Telephone Encounter (Signed)
Received call from patient [phone]~ telephone.   Patient is inquiring about clearance to proceed with breast biopsy.   Of note, patient states that ICD is to be reprogrammed on 01/06/2023.  Please advise on the following: Is there a certain waiting period after gen change for breast biopsy and tag placement? Is rep needed in OR or can we turn off the ICD during surgery?   Thank you.

## 2022-12-24 NOTE — Telephone Encounter (Signed)
  Patient Consent for Virtual Visit         CALIYA Warren has provided verbal consent on 12/24/2022 for a virtual visit (video or telephone).   CONSENT FOR VIRTUAL VISIT FOR:  Anita Warren  By participating in this virtual visit I agree to the following:  I hereby voluntarily request, consent and authorize [facility] and its employed or contracted physicians, physician assistants, nurse practitioners or other licensed health care professionals (the Practitioner), to provide me with telemedicine health care services (the "Services") as deemed necessary by the treating Practitioner. I acknowledge and consent to receive the Services by the Practitioner via telemedicine. I understand that the telemedicine visit will involve communicating with the Practitioner through live audiovisual communication technology and the disclosure of certain medical information by electronic transmission. I acknowledge that I have been given the opportunity to request an in-person assessment or other available alternative prior to the telemedicine visit and am voluntarily participating in the telemedicine visit.  I understand that I have the right to withhold or withdraw my consent to the use of telemedicine in the course of my care at any time, without affecting my right to future care or treatment, and that the Practitioner or I may terminate the telemedicine visit at any time. I understand that I have the right to inspect all information obtained and/or recorded in the course of the telemedicine visit and may receive copies of available information for a reasonable fee.  I understand that some of the potential risks of receiving the Services via telemedicine include:  Delay or interruption in medical evaluation due to technological equipment failure or disruption; Information transmitted may not be sufficient (e.g. poor resolution of images) to allow for appropriate medical decision making by the  Practitioner; and/or  In rare instances, security protocols could fail, causing a breach of personal health information.  Furthermore, I acknowledge that it is my responsibility to provide information about my medical history, conditions and care that is complete and accurate to the best of my ability. I acknowledge that Practitioner's advice, recommendations, and/or decision may be based on factors not within their control, such as incomplete or inaccurate data provided by me or distortions of diagnostic images or specimens that may result from electronic transmissions. I understand that the practice of medicine is not an exact science and that Practitioner makes no warranties or guarantees regarding treatment outcomes. I acknowledge that a copy of this consent can be made available to me via my patient portal ([facility]), or I can request a printed copy by calling the office of [facility].    I understand that my insurance will be billed for this visit.   I have read or had this consent read to me. I understand the contents of this consent, which adequately explains the benefits and risks of the Services being provided via telemedicine.  I have been provided ample opportunity to ask questions regarding this consent and the Services and have had my questions answered to my satisfaction. I give my informed consent for the services to be provided through the use of telemedicine in my medical care

## 2022-12-24 NOTE — Telephone Encounter (Signed)
Per Dr. Myles Gip  From a pre-procedural risk stratification perspective, the recent generator change will not have any bearing on the breast biopsy.   If any electrocautery is to be used, she will need to have the device reprogrammed with tachy therapies off for the procedure or a magnet applied over the device -- standard procedural protocol since the surgical site will be in the radiofrequency detection field of the device.     Preoperative team, please contact this patient and set up a phone call appointment for further preoperative risk assessment. Please obtain consent and complete medication review. Thank you for your help.  I confirm that guidance regarding antiplatelet and oral anticoagulation therapy has been completed and, if necessary, noted below. Eliquis hold addressed   Ledora Bottcher, PA 12/24/2022, 12:07 PM [place]

## 2022-12-24 NOTE — Telephone Encounter (Signed)
Returned pt called. Pt scheduled for telehealth visit on 3/6. Med rec and consent done

## 2022-12-24 NOTE — Telephone Encounter (Signed)
Pt called stating she is returning a call

## 2022-12-24 NOTE — Telephone Encounter (Signed)
Follow up scheduled

## 2022-12-25 ENCOUNTER — Encounter: Payer: Self-pay | Admitting: Radiology

## 2022-12-30 NOTE — Progress Notes (Signed)
Virtual Visit via Telephone Note   Because of Anita Warren's co-morbid illnesses, she is at least at moderate risk for complications without adequate follow up.  This format is felt to be most appropriate for this patient at this time.  The patient did not have access to video technology/had technical difficulties with video requiring transitioning to audio format only (telephone).  All issues noted in this document were discussed and addressed.  No physical exam could be performed with this format.  Please refer to the patient's chart for her consent to telehealth for [facility].  Evaluation Performed:  Preoperative cardiovascular risk assessment _____________   Date:  12/30/2022   Patient ID:  Anita Warren, DOB [birth year], MRN [id number] Patient Location:  Home Provider location:   Office  Primary Care Provider:  Elfredia Nevins, MD Primary Cardiologist:  None  Chief Complaint / Patient Profile   62 y.o. y/o female with a h/o Chronic systolic CHF, paroxysmal atrial fibrillation who is pending excisional breast biopsy with radiofrequency tag placement and presents today for telephonic preoperative cardiovascular risk assessment.  History of Present Illness    Anita Warren is a 62 y.o. female who presents via audio/video conferencing for a telehealth visit today.  Pt was last seen in cardiology clinic on 08/05/2022 by Dr. Nelly Laurence.  At that time RUBBIE LAPOINTE was doing well .  The patient is now pending procedure as outlined above. Since her last visit, she remained stable from a cardiac standpoint.  Today she denies chest pain, shortness of breath, lower extremity edema, fatigue, palpitations, melena, hematuria, hemoptysis, diaphoresis, weakness, presyncope, syncope, orthopnea, and PND.   Past Medical History    Past Medical History:  Diagnosis Date   AICD (automatic cardioverter/defibrillator) present    St. Jude AICD implanted 03-07-2011  Dr. Allred/-Dr. Shirlee Latch now follows   CHF (congestive heart failure) ([facility])    meds controlling, no episodes since 2014   Chronic systolic heart failure ([facility])    Essential hypertension, benign    Headache    History of kidney stones    multiple kidney stones in past   History of medication noncompliance    Hydronephrosis with renal and ureteral calculus obstruction 09/05/2013   Nonischemic cardiomyopathy ([facility])    LVEF 5-10%, likely viral (no CAD by cath 01/30/11)   NSVT (nonsustained ventricular tachycardia) ([facility])    Obesity    Past Surgical History:  Procedure Laterality Date   ABDOMINAL HYSTERECTOMY     BREAST BIOPSY Left 10/19/2019   Procedure: BREAST BIOPSY WITH NEEDLE LOCALIZATION;  Surgeon: Franky Macho, MD;  Location: [facility];  Service: General;  Laterality: Left;   BREAST BIOPSY Right 11/18/2022   [place] RT BREAST BX W LOC DEV 1ST LESION IMG BX SPEC US GUIDE 11/18/2022 [facility]-ULTRASOUND   BREAST LUMPECTOMY  1989   L breast- benign   CARDIAC CATHETERIZATION     CARDIAC DEFIBRILLATOR PLACEMENT  5/12   SJM by JA   CESAREAN SECTION     x 2   CHOLECYSTECTOMY     COLONOSCOPY  07/02/2012   Procedure: COLONOSCOPY;  Surgeon: West Bali, MD;  [facility];  Service: Endoscopy;  Laterality: N/A;  1:15/PATIENT HAS A DEFIBRILLATOR   CYSTOSCOPY W/ URETERAL STENT PLACEMENT Right 09/06/2013   Procedure: CYSTOSCOPY WITH RIGHT RETROGRADE PYELOGRAM; RIGHT URETERAL STENT PLACEMENT;  Surgeon: Ky Barban, MD;  Location: [facility];  Service: Urology;  Laterality: Right;   CYSTOSCOPY W/ URETERAL STENT PLACEMENT N/A 04/28/2016  Procedure: CYSTOSCOPY WITH  RIGHT RETROGRADE PYELOGRAM/RIGHT URETERAL STENT PLACEMENT;  Surgeon: Barron Alvine, MD;  [facility];  Service: Urology;  Laterality: N/A;   CYSTOSCOPY W/ URETERAL STENT REMOVAL Right 07/21/2016   Procedure: CYSTOSCOPY WITH STENT REMOVAL;  Surgeon: Marcine Matar, MD;  Location[facility];  Service: Urology;  Laterality: Right;    CYSTOSCOPY/URETEROSCOPY/HOLMIUM LASER/STENT PLACEMENT Right 07/21/2016   Procedure: CYSTOSCOPY/URETEROSCOPY/HOLMIUM LASER/   right retrograde pylegram;  Surgeon: Marcine Matar, MD;  [facility];  Service: Urology;  Laterality: Right;   endovenous laser ablation and stab phlebectomies Right 08/01/2020   EVLA of right greater saphenous vein and > 20 stab phlebectomies   ICD GENERATOR CHANGEOUT N/A 12/05/2022   Procedure: ICD GENERATOR CHANGEOUT;  Surgeon: Nelly Laurence, Roberts Gaudy, MD;  Location: [facility] INVASIVE CV LAB;  Service: Cardiovascular;  Laterality: N/A;   TONSILLECTOMY     TUBAL LIGATION      Allergies  Allergies  Allergen Reactions   Peanut-Containing Drug Products Anaphylaxis   Ace Inhibitors Swelling   Morphine Nausea And Vomiting   Demerol Rash    Home Medications    Prior to Admission medications   Medication Sig Start Date End Date Taking? Authorizing Provider  acetaminophen (TYLENOL) 500 MG tablet Take 1,000 mg by mouth every 6 (six) hours as needed for headache.     [provider]  apixaban (ELIQUIS) 5 MG TABS tablet Take 1 tablet (5 mg total) by mouth 2 (two) times daily. 12/03/22   Laurey Morale, MD  carvedilol (COREG) 25 MG tablet TAKE 1 TABLET BY MOUTH TWICE DAILY WITH A MEAL 09/29/22   Laurey Morale, MD  Coenzyme Q10 (CO Q 10) 100 MG CAPS Take 100 mg by mouth daily.     [provider]  dapagliflozin propanediol (FARXIGA) 10 MG TABS tablet Take 1 tablet (10 mg total) by mouth daily before breakfast. 12/03/22   Laurey Morale, MD  diphenhydrAMINE (BENADRYL) 25 MG tablet Take 25 mg by mouth every 6 (six) hours as needed (headache).     [provider]  furosemide (LASIX) 20 MG tablet Take 1 tablet (20 mg total) by mouth daily. 06/23/22   Laurey Morale, MD  hydrALAZINE (APRESOLINE) 100 MG tablet TAKE 1 TABLET BY MOUTH THREE TIMES DAILY 12/08/22   Laurey Morale, MD  isosorbide mononitrate (IMDUR) 30 MG 24 hr tablet Take 3 tablets by mouth  once daily 12/17/22   Laurey Morale, MD  potassium chloride (KLOR-CON) 10 MEQ tablet Take 1 tablet by mouth once daily 11/25/22   Laurey Morale, MD  spironolactone (ALDACTONE) 50 MG tablet Take 1 tablet by mouth once daily 12/18/22   Laurey Morale, MD    Physical Exam    Vital Signs:  LATRINDA CARAVEO does not have vital signs available for review today.  Given telephonic nature of communication, physical exam is limited. AAOx3. NAD. Normal affect.  Speech and respirations are unlabored.  Accessory Clinical Findings    None  Assessment & Plan    1.  Preoperative Cardiovascular Risk Assessment: Excisional breast biopsy with radiofrequency tag placement, Dr. Franky Macho, [facility]    Primary Cardiologist: Dr. Dina Rich  Chart reviewed as part of pre-operative protocol coverage. Given past medical history and time since last visit, based on ACC/AHA guidelines, ANNITRA REEG would be at acceptable risk for the planned procedure without further cardiovascular testing.   Her RCRI is a class II risk, 0.9% risk of major cardiac  event.  She is able to complete greater than 4 METS of physical activity.  The patient was advised that if she develops new symptoms prior to surgery to contact our office to arrange for a follow-up visit, and she verbalized understanding.  Patient with diagnosis of A Fib on Eliquis for anticoagulation.     Procedure:  Excisional Breast Biopsy with Radiofrequency Tag Placement  Date of procedure: TBD     CHA2DS2-VASc Score = 3  This indicates a 3.2% annual risk of stroke. The patient's score is based upon: CHF History: 1 HTN History: 1 Diabetes History: 0 Stroke History: 0 Vascular Disease History: 0 Age Score: 0 Gender Score: 1     CrCl 50 mL/min Platelet count 163K     Per office protocol, patient can hold Eliquis for 2 days prior to procedure.  Per Dr. Nelly Laurence-   "From a pre-procedural risk  stratification perspective, the recent generator change will not have any bearing on the breast biopsy.   If any electrocautery is to be used, she will need to have the device reprogrammed with tachy therapies off for the procedure or a magnet applied over the device -- standard procedural protocol since the surgical site will be in the radiofrequency detection field of the device."   I will route this recommendation to the requesting party via Epic fax function and remove from pre-op pool.      Time:   Today, I have spent 5 minutes with the patient with telehealth technology discussing medical history, symptoms, and management plan.Prior to her phone evaluation I spent greater than 10 minutes reviewing her past medical history and cardiac medications.     Ronney Asters, NP  12/30/2022, 3:57 PM

## 2022-12-31 ENCOUNTER — Ambulatory Visit: Payer: 59 | Attending: Internal Medicine

## 2022-12-31 ENCOUNTER — Other Ambulatory Visit (HOSPITAL_COMMUNITY): Payer: Self-pay | Admitting: General Surgery

## 2022-12-31 DIAGNOSIS — D241 Benign neoplasm of right breast: Secondary | ICD-10-CM

## 2022-12-31 DIAGNOSIS — Z01810 Encounter for preprocedural cardiovascular examination: Secondary | ICD-10-CM | POA: Diagnosis not present

## 2023-01-02 NOTE — H&P (Addendum)
Anita Warren; [id number]; [birth year]   HPI Patient is a 62 year old black female who was referred to my care by Dr. 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  She has had previous breast biopsies on the left side which have been negative.  She does have a sister with breast cancer.  She denies feeling any lump.  She has not had any nipple discharge.  She is having her AICD battery replaced tomorrow. Past Medical History:  Diagnosis Date   AICD (automatic cardioverter/defibrillator) present    [employer] AICD implanted 03-07-2011 Dr. Allred/-Dr. [name] now follows   CHF (congestive heart failure) ([place])    meds controlling, no episodes since [id number]   Chronic systolic heart failure ([place])    Essential hypertension, benign    Headache    History of kidney stones    multiple kidney stones in past   History of medication noncompliance    Hydronephrosis with renal and ureteral calculus obstruction 09/05/2013   Nonischemic cardiomyopathy ([place])    LVEF 5-10%, likely viral (no CAD by cath 01/30/11)   NSVT (nonsustained ventricular tachycardia) ([place])    Obesity     Past Surgical History:  Procedure Laterality Date   ABDOMINAL HYSTERECTOMY     BREAST BIOPSY Left 10/19/2019   Procedure: BREAST BIOPSY WITH NEEDLE LOCALIZATION;  Surgeon: Aviva Signs, MD;  Location: [facility];  Service: General;  Laterality: Left;   BREAST BIOPSY Right 11/18/2022   [place] RT BREAST BX W LOC DEV 1ST LESION IMG BX [place] US GUIDE 11/18/2022 [facility]UL[facility]   BREAST LUMPECTOMY  1989   L breast- benign   CARDIAC CATHETERIZATION     CARDIAC DEFIBRILLATOR PLACEMENT  5/12   SJM by JA   CESAREAN SECTION     x 2   CHOLECYSTECTOMY     COLONOSCOPY  07/02/2012   Procedure: COLONOSCOPY;  Surgeon: [name], MD;  Location: [facility];  Service: Endoscopy;  Laterality: N/A;  1:15/PATIENT HAS A DEFIBRILLATOR   CYSTOSCOPY W/ URETERAL STENT PLACEMENT  Right 09/06/2013   Procedure: CYSTOSCOPY WITH RIGHT RETROGRADE PYELOGRAM; RIGHT URETERAL STENT PLACEMENT;  Surgeon: [name], MD;  Location: [facility];  Service: Urology;  Laterality: Right;   CYSTOSCOPY W/ URETERAL STENT PLACEMENT N/A 04/28/2016   Procedure: CYSTOSCOPY WITH  RIGHT RETROGRADE PYELOGRAM/RIGHT URETERAL STENT PLACEMENT;  Surgeon: Rana Snare, MD;  Location: [facility];  Service: Urology;  Laterality: N/A;   CYSTOSCOPY W/ URETERAL STENT REMOVAL Right 07/21/2016   Procedure: CYSTOSCOPY WITH STENT REMOVAL;  Surgeon: Franchot Gallo, MD;  Location: [facility];  Service: Urology;  Laterality: Right;   CYSTOSCOPY/URETEROSCOPY/HOLMIUM LASER/STENT PLACEMENT Right 07/21/2016   Procedure: CYSTOSCOPY/URETEROSCOPY/HOLMIUM LASER/   right retrograde pylegram;  Surgeon: Franchot Gallo, MD;  Location: [facility];  Service: Urology;  Laterality: Right;   endovenous laser ablation and stab phlebectomies Right 08/01/2020   EVLA of right greater saphenous vein and > 20 stab phlebectomies   TONSILLECTOMY     TUBAL LIGATION      Family History  Problem Relation Age of Onset   Diabetes Mother    Hypertension Brother    Breast cancer Sister    Diabetes Sister        borderline   Colon cancer Neg Hx     Current Outpatient Medications on File Prior to Visit  Medication Sig Dispense Refill   acetaminophen (TYLENOL) 500 MG tablet Take 1,000 mg by mouth every 6 (  six) hours as needed for headache.      apixaban (ELIQUIS) 5 MG TABS tablet Take 1 tablet (5 mg total) by mouth 2 (two) times daily. 180 tablet 3   carvedilol (COREG) 25 MG tablet TAKE 1 TABLET BY MOUTH TWICE DAILY WITH A MEAL 180 tablet 3   Coenzyme Q10 (CO Q 10) 100 MG CAPS Take 100 mg by mouth daily.      dapagliflozin propanediol (FARXIGA) 10 MG TABS tablet Take 1 tablet (10 mg total) by mouth daily before breakfast. 90 tablet 3   diphenhydrAMINE (BENADRYL) 25 MG tablet Take 25 mg by mouth every 6 (six) hours as needed (headache).       furosemide (LASIX) 20 MG tablet Take 1 tablet (20 mg total) by mouth daily. 90 tablet 3   hydrALAZINE (APRESOLINE) 100 MG tablet TAKE 1 TABLET BY MOUTH THREE TIMES DAILY 270 tablet 0   isosorbide mononitrate (IMDUR) 30 MG 24 hr tablet Take 3 tablets (90 mg total) by mouth daily. 270 tablet 6   potassium chloride (KLOR-CON) 10 MEQ tablet Take 1 tablet by mouth once daily 90 tablet 0   spironolactone (ALDACTONE) 50 MG tablet Take 1 tablet by mouth once daily 90 tablet 0   No current facility-administered medications on file prior to visit.    Allergies  Allergen Reactions   Peanut-Containing Drug Products Anaphylaxis   Ace Inhibitors Swelling   Morphine Nausea And Vomiting   Demerol Rash    Social History   Substance and Sexual Activity  Alcohol Use No    Social History   Tobacco Use  Smoking Status Never  Smokeless Tobacco Never    Review of Systems  Constitutional: Negative.   HENT: Negative.    Eyes:  Positive for blurred vision.  Respiratory: Negative.    Cardiovascular: Negative.   Gastrointestinal: Negative.   Genitourinary: Negative.   Musculoskeletal: Negative.   Skin: Negative.   Neurological: Negative.   Endo/Heme/Allergies: Negative.     Objective   Vitals:   12/04/22 0903  BP: 107/60  Pulse: 78  Resp: 14  Temp: 98.3 F (36.8 C)  SpO2: 93%    Physical Exam Vitals reviewed.  Constitutional:      Appearance: Normal appearance. She is not ill-appearing.  HENT:     Head: Normocephalic and atraumatic.  Cardiovascular:     Rate and Rhythm: Normal rate and regular rhythm.     Heart sounds: Normal heart sounds. No murmur heard.    No friction rub. No gallop.  Pulmonary:     Effort: Pulmonary effort is normal. No respiratory distress.     Breath sounds: No stridor. No wheezing, rhonchi or rales.  Lymphadenopathy:     Cervical: No cervical adenopathy.  Skin:    General: Skin is warm and dry.  Neurological:     Mental Status: She is alert and  oriented to person, place, and time.   Breast: No dominant mass, nipple discharge, or dimpling in either breast. Axillary lymphadenopathy noted.  Mammogram and pathology reports reviewed  Assessment  Intraductal papilloma, right breast AICD in place, left upper chest Plan  Will contact patient next week for radiofrequency tag placement once cleared by cardiology.  Patient will need a right breast biopsy after radiofrequency tag placement.  The risks and benefits of the procedure including bleeding and infection were fully explained to the patient, who gave informed consent. Addendum:  Patient has been cleared by cardiology.  Please see their note.

## 2023-01-06 ENCOUNTER — Ambulatory Visit (HOSPITAL_COMMUNITY)
Admission: RE | Admit: 2023-01-06 | Discharge: 2023-01-06 | Disposition: A | Discharge status: Home / Self Care | Payer: 59 | Source: Ambulatory Visit | Attending: General Surgery | Admitting: General Surgery

## 2023-01-06 ENCOUNTER — Ambulatory Visit (INDEPENDENT_AMBULATORY_CARE_PROVIDER_SITE_OTHER): Payer: 59

## 2023-01-06 DIAGNOSIS — I5022 Chronic systolic (congestive) heart failure: Secondary | ICD-10-CM | POA: Insufficient documentation

## 2023-01-06 DIAGNOSIS — Z95810 Presence of automatic (implantable) cardiac defibrillator: Secondary | ICD-10-CM | POA: Insufficient documentation

## 2023-01-06 DIAGNOSIS — D241 Benign neoplasm of right breast: Secondary | ICD-10-CM | POA: Insufficient documentation

## 2023-01-06 DIAGNOSIS — N6311 Unspecified lump in the right breast, upper outer quadrant: Secondary | ICD-10-CM | POA: Diagnosis not present

## 2023-01-06 LAB — CUP PACEART INCLINIC DEVICE CHECK
Battery Remaining Longevity: 127 mo
Brady Statistic RV Percent Paced: 0 %
Date Time Interrogation Session: 20240312080504
HighPow Impedance: 59.625
Implantable Lead Connection Status: 753985
Implantable Lead Implant Date: 20120511
Implantable Lead Location: 753860
Implantable Lead Model: 7120
Implantable Pulse Generator Implant Date: 20240209
Lead Channel Impedance Value: 512.5 Ohm
Lead Channel Sensing Intrinsic Amplitude: 12 mV
Lead Channel Setting Pacing Amplitude: 2.5 V
Lead Channel Setting Pacing Pulse Width: 0.5 ms
Lead Channel Setting Sensing Sensitivity: 0.5 mV
Pulse Gen Serial Number: 211012829
Zone Setting Status: 755011

## 2023-01-06 MED ORDER — LIDOCAINE HCL (PF) 2 % IJ SOLN
INTRAMUSCULAR | Status: AC
  Filled 2023-01-06: qty 10

## 2023-01-06 NOTE — Progress Notes (Signed)
EPIC Encounter for ICM Monitoring  Patient Name: Anita Warren is a 62 y.o. female Date: 01/06/2023 Primary Care Physican: Pixie Casino, MD Primary Cardiologist: [name] Electrophysiologist: Mealor 08/12/2022 Weight: 172 lbs 01/06/2023 Weight: 175 lbs                                                            Spoke with patient and heart failure questions reviewed.  Transmission results reviewed.  Pt asymptomatic for fluid accumulation or dryness symptoms.  Reports feeling well at this time and voices no complaints.   She reports not drinking enough to stay hydrated.     Coruve thoracic impedance suggesting normal fluid levels with exception of possible dryness from 2/24-2/29 and 3/2-3/5.     Prescribed:  Furosemide 20 mg take 1 tablet (20 mg total) daily.   Potassium 10 mEq take 1 tablet daily. Spironolactone 50 mg take 1 tablet daily     Labs: 07/28/2022 Creatinine 1.56, BUN 25, Potassium 4.5, Sodium 137, GFR 38 A complete set of results can be found in Results Review.   Recommendations:  Advised increase fluid intake to 64 oz daily to stay hydrated.   Follow-up plan: ICM clinic phone appointment on 02/09/2023.   91 day device clinic remote transmission 03/11/2023.      EP/Cardiology Office Visits:  Recall 08/05/2023 with Dr Myles Gip.  Encouraged to call HF clinic for 6 month follow up appt.   Recall 01/24/2023 with Dr [name].   Copy of ICM check sent to Dr. Myles Gip.      3 month ICM trend: 01/01/2023.    12-14 Month ICM trend:     Rosalene Billings, RN 01/06/2023 10:40 AM

## 2023-01-06 NOTE — Progress Notes (Signed)
In clinic device check for programming changes per Dr. Myles Gip.  Added VT zone at 181 bpm for 40 intervals-monitor only. Turned off Pt alarm for HV lead impedance out of range-known issue. Increased upper limit of VF zone to 300 bpm per industry.

## 2023-01-07 NOTE — Patient Instructions (Signed)
Anita Warren Collar  01/07/2023     '@[email or web address]'$ @   Your procedure is scheduled on  01/14/2023.   Report to [facility] at  0600 A.M.   Call this number if you have problems the morning of surgery:  [phone]  If you experience any cold or flu symptoms such as cough, fever, chills, shortness of breath, etc. between now and your scheduled surgery, please notify us at the above number.   Remember:  Do not eat or drink after midnight.      Your last dose of farxiga should be on 01/10/2023.       Your last dose of eliquis should be on 01/11/2023.       Take these medicines the morning of surgery with A SIP OF WATER                                 carvedilol, isosorbide.     Do not wear jewelry, make-up or nail polish.  Do not wear lotions, powders, or perfumes, or deodorant.  Do not shave 48 hours prior to surgery.  Men may shave face and neck.  Do not bring valuables to the hospital.  [facility] is not responsible for any belongings or valuables.  Contacts, dentures or bridgework may not be worn into surgery.  Leave your suitcase in the car.  After surgery it may be brought to your room.  For patients admitted to the hospital, discharge time will be determined by your treatment team.  Patients discharged the day of surgery will not be allowed to drive home and must have someone with them for 24 hours.    Special instructions:   DO NOT smoke tobacco or vape for 24 hours before your procedure.  Please read over the following fact sheets that you were given. Coughing and Deep Breathing, Surgical Site Infection Prevention, Anesthesia Post-op Instructions, and Care and Recovery After Surgery        Breast Biopsy, Care After The following information offers guidance on how to care for yourself after your breast biopsy. Your doctor may also give you more specific instructions. If you have problems or questions, contact your doctor. What can I expect after  the procedure? After a breast biopsy, it is common to have: Bruising on your breast. Breast swelling. Numbness, tingling, or pain near your biopsy site. This site is where tissue was taken out for study. Follow these instructions at home: Medicines Take over-the-counter and prescription medicines only as told by your doctor. If you were given a sedative during your procedure, do not drive or use machines until your doctor says that it is safe. A sedative is a medicine that helps you relax. Do not drink alcohol while taking pain medicine. Ask your doctor if you should avoid driving or using machines while you are taking your medicine. Biopsy site care     Follow instructions from your doctor about how to take care of your cut from surgery (incision) or your puncture site. Make sure you: Wash your hands with soap and water for at least 20 seconds before and after you change your bandage. If you cannot use soap and water, use hand sanitizer. Change your bandage. Leave stitches or skin glue in place for at least 2 weeks. Leave tape strips alone unless you are told to take them off. You may trim the edges of the tape strips if  they curl up. If you have stitches, keep them dry when you take a bath or a shower. Check your cut or puncture site every day for signs of infection. Look for: More redness, swelling, or pain. More fluid or blood. Warmth. Pus or a bad smell. Protect the biopsy site. Do not let the site get bumped. Managing pain If told, put ice on the biopsy site. To do this: Put ice in a plastic bag. Place a towel between your skin and the bag. Leave the ice on for 20 minutes, 2-3 times a day. Take off the ice if your skin turns bright red. This is very important. If you cannot feel pain, heat, or cold, you have a greater risk of damage to the area. Activity If a cut was made in your skin to do the biopsy, avoid activities that could pull your cut open. These  include: Stretching. Reaching over your head. Exercise. Sports. Lifting anything that weighs more than 3 lb (1.4 kg). Return to your normal activities when your doctor says that it is safe. General instructions Follow your normal diet. Wear a good support bra for as long as told by your doctor. Get checked for extra fluid around your lymph nodes (lymphedema) as often as told. Do not smoke or use any products that contain nicotine or tobacco. If you need help quitting, ask your doctor. Keep all follow-up visits. Contact a doctor if: You notice any of these at or near the biopsy site: More redness, swelling, or pain. More fluid or blood. Warmth. Pus or a bad smell. The site breaking open after the stitches or skin tape strips have been removed. You have a rash or a fever. Get help right away if: You have trouble breathing. You have red streaks around the biopsy site. Summary After a breast biopsy, it is common to have bruising, numbness, tingling, or pain near your biopsy site. Ask your doctor if you should avoid driving or using machines while you are taking your medicine. If you had a cut made in your skin to do the biopsy, avoid activities that may pull the cut open. Return to your normal activities when your doctor says that it is safe. Wear a good support bra for as long as told by your doctor. This information is not intended to replace advice given to you by your health care provider. Make sure you discuss any questions you have with your health care provider. Document Revised: 08/07/2021 Document Reviewed: 08/07/2021 Elsevier Patient Education  [place] Anesthesia, Adult, Care After The following information offers guidance on how to care for yourself after your procedure. Your health care provider may also give you more specific instructions. If you have problems or questions, contact your health care provider. What can I expect after the procedure? After  the procedure, it is common for people to: Have pain or discomfort at the IV site. Have nausea or vomiting. Have a sore throat or hoarseness. Have trouble concentrating. Feel cold or chills. Feel weak, sleepy, or tired (fatigue). Have soreness and body aches. These can affect parts of the body that were not involved in surgery. Follow these instructions at home: For the time period you were told by your health care provider:  Rest. Do not participate in activities where you could fall or become injured. Do not drive or use machinery. Do not drink alcohol. Do not take sleeping pills or medicines that cause drowsiness. Do not make important decisions or sign legal documents.  Do not take care of children on your own. General instructions Drink enough fluid to keep your urine pale yellow. If you have sleep apnea, surgery and certain medicines can increase your risk for breathing problems. Follow instructions from your health care provider about wearing your sleep device: Anytime you are sleeping, including during daytime naps. While taking prescription pain medicines, sleeping medicines, or medicines that make you drowsy. Return to your normal activities as told by your health care provider. Ask your health care provider what activities are safe for you. Take over-the-counter and prescription medicines only as told by your health care provider. Do not use any products that contain nicotine or tobacco. These products include cigarettes, chewing tobacco, and vaping devices, such as e-cigarettes. These can delay incision healing after surgery. If you need help quitting, ask your health care provider. Contact a health care provider if: You have nausea or vomiting that does not get better with medicine. You vomit every time you eat or drink. You have pain that does not get better with medicine. You cannot urinate or have bloody urine. You develop a skin rash. You have a fever. Get help right  away if: You have trouble breathing. You have chest pain. You vomit blood. These symptoms may be an emergency. Get help right away. Call 911. Do not wait to see if the symptoms will go away. Do not drive yourself to the hospital. Summary After the procedure, it is common to have a sore throat, hoarseness, nausea, vomiting, or to feel weak, sleepy, or fatigue. For the time period you were told by your health care provider, do not drive or use machinery. Get help right away if you have difficulty breathing, have chest pain, or vomit blood. These symptoms may be an emergency. This information is not intended to replace advice given to you by your health care provider. Make sure you discuss any questions you have with your health care provider. Document Revised: 01/10/2022 Document Reviewed: 01/10/2022 Elsevier Patient Education  [place]. How to Use Chlorhexidine Before Surgery Chlorhexidine gluconate (CHG) is a germ-killing (antiseptic) solution that is used to clean the skin. It can get rid of the bacteria that normally live on the skin and can keep them away for about 24 hours. To clean your skin with CHG, you may be given: A CHG solution to use in the shower or as part of a sponge bath. A prepackaged cloth that contains CHG. Cleaning your skin with CHG may help lower the risk for infection: While you are staying in the intensive care unit of the hospital. If you have a vascular access, such as a central line, to provide short-term or long-term access to your veins. If you have a catheter to drain urine from your bladder. If you are on a ventilator. A ventilator is a machine that helps you breathe by moving air in and out of your lungs. After surgery. What are the risks? Risks of using CHG include: A skin reaction. Hearing loss, if CHG gets in your ears and you have a perforated eardrum. Eye injury, if CHG gets in your eyes and is not rinsed out. The CHG product catching  fire. Make sure that you avoid smoking and flames after applying CHG to your skin. Do not use CHG: If you have a chlorhexidine allergy or have previously reacted to chlorhexidine. On babies younger than 47 months of age. How to use CHG solution Use CHG only as told by your health care provider, and  follow the instructions on the label. Use the full amount of CHG as directed. Usually, this is one bottle. During a shower Follow these steps when using CHG solution during a shower (unless your health care provider gives you different instructions): Start the shower. Use your normal soap and shampoo to wash your face and hair. Turn off the shower or move out of the shower stream. Pour the CHG onto a clean washcloth. Do not use any type of brush or rough-edged sponge. Starting at your neck, lather your body down to your toes. Make sure you follow these instructions: If you will be having surgery, pay special attention to the part of your body where you will be having surgery. Scrub this area for at least 1 minute. Do not use CHG on your head or face. If the solution gets into your ears or eyes, rinse them well with water. Avoid your genital area. Avoid any areas of skin that have broken skin, cuts, or scrapes. Scrub your back and under your arms. Make sure to wash skin folds. Let the lather sit on your skin for 1-2 minutes or as long as told by your health care provider. Thoroughly rinse your entire body in the shower. Make sure that all body creases and crevices are rinsed well. Dry off with a clean towel. Do not put any substances on your body afterward--such as powder, lotion, or perfume--unless you are told to do so by your health care provider. Only use lotions that are recommended by the manufacturer. Put on clean clothes or pajamas. If it is the night before your surgery, sleep in clean sheets.  During a sponge bath Follow these steps when using CHG solution during a sponge bath (unless  your health care provider gives you different instructions): Use your normal soap and shampoo to wash your face and hair. Pour the CHG onto a clean washcloth. Starting at your neck, lather your body down to your toes. Make sure you follow these instructions: If you will be having surgery, pay special attention to the part of your body where you will be having surgery. Scrub this area for at least 1 minute. Do not use CHG on your head or face. If the solution gets into your ears or eyes, rinse them well with water. Avoid your genital area. Avoid any areas of skin that have broken skin, cuts, or scrapes. Scrub your back and under your arms. Make sure to wash skin folds. Let the lather sit on your skin for 1-2 minutes or as long as told by your health care provider. Using a different clean, wet washcloth, thoroughly rinse your entire body. Make sure that all body creases and crevices are rinsed well. Dry off with a clean towel. Do not put any substances on your body afterward--such as powder, lotion, or perfume--unless you are told to do so by your health care provider. Only use lotions that are recommended by the manufacturer. Put on clean clothes or pajamas. If it is the night before your surgery, sleep in clean sheets. How to use CHG prepackaged cloths Only use CHG cloths as told by your health care provider, and follow the instructions on the label. Use the CHG cloth on clean, dry skin. Do not use the CHG cloth on your head or face unless your health care provider tells you to. When washing with the CHG cloth: Avoid your genital area. Avoid any areas of skin that have broken skin, cuts, or scrapes. Before surgery Follow these steps  when using a CHG cloth to clean before surgery (unless your health care provider gives you different instructions): Using the CHG cloth, vigorously scrub the part of your body where you will be having surgery. Scrub using a back-and-forth motion for 3 minutes. The  area on your body should be completely wet with CHG when you are done scrubbing. Do not rinse. Discard the cloth and let the area air-dry. Do not put any substances on the area afterward, such as powder, lotion, or perfume. Put on clean clothes or pajamas. If it is the night before your surgery, sleep in clean sheets.  For general bathing Follow these steps when using CHG cloths for general bathing (unless your health care provider gives you different instructions). Use a separate CHG cloth for each area of your body. Make sure you wash between any folds of skin and between your fingers and toes. Wash your body in the following order, switching to a new cloth after each step: The front of your neck, shoulders, and chest. Both of your arms, under your arms, and your hands. Your stomach and groin area, avoiding the genitals. Your right leg and foot. Your left leg and foot. The back of your neck, your back, and your buttocks. Do not rinse. Discard the cloth and let the area air-dry. Do not put any substances on your body afterward--such as powder, lotion, or perfume--unless you are told to do so by your health care provider. Only use lotions that are recommended by the manufacturer. Put on clean clothes or pajamas. Contact a health care provider if: Your skin gets irritated after scrubbing. You have questions about using your solution or cloth. You swallow any chlorhexidine. Call your local poison control center (1-508-607-5580 in the [place] Get help right away if: Your eyes itch badly, or they become very red or swollen. Your skin itches badly and is red or swollen. Your hearing changes. You have trouble seeing. You have swelling or tingling in your mouth or throat. You have trouble breathing. These symptoms may represent a serious problem that is an emergency. Do not wait to see if the symptoms will go away. Get medical help right away. Call your local emergency services (911 in the [place]  Do not drive yourself to the hospital. Summary Chlorhexidine gluconate (CHG) is a germ-killing (antiseptic) solution that is used to clean the skin. Cleaning your skin with CHG may help to lower your risk for infection. You may be given CHG to use for bathing. It may be in a bottle or in a prepackaged cloth to use on your skin. Carefully follow your health care provider's instructions and the instructions on the product label. Do not use CHG if you have a chlorhexidine allergy. Contact your health care provider if your skin gets irritated after scrubbing. This information is not intended to replace advice given to you by your health care provider. Make sure you discuss any questions you have with your health care provider. Document Revised: 02/10/2022 Document Reviewed: 12/24/2020 Elsevier Patient Education  [place].

## 2023-01-12 ENCOUNTER — Encounter (HOSPITAL_COMMUNITY)
Admission: RE | Admit: 2023-01-12 | Discharge: 2023-01-12 | Disposition: A | Discharge status: Home / Self Care | Payer: 59 | Source: Ambulatory Visit | Attending: General Surgery | Admitting: General Surgery

## 2023-01-12 ENCOUNTER — Encounter (HOSPITAL_COMMUNITY): Payer: Self-pay

## 2023-01-12 DIAGNOSIS — I10 Essential (primary) hypertension: Secondary | ICD-10-CM | POA: Diagnosis not present

## 2023-01-12 DIAGNOSIS — Z01812 Encounter for preprocedural laboratory examination: Secondary | ICD-10-CM | POA: Diagnosis not present

## 2023-01-12 DIAGNOSIS — E1159 Type 2 diabetes mellitus with other circulatory complications: Secondary | ICD-10-CM | POA: Diagnosis not present

## 2023-01-12 DIAGNOSIS — I152 Hypertension secondary to endocrine disorders: Secondary | ICD-10-CM | POA: Diagnosis not present

## 2023-01-12 DIAGNOSIS — E781 Pure hyperglyceridemia: Secondary | ICD-10-CM | POA: Diagnosis not present

## 2023-01-12 DIAGNOSIS — E1140 Type 2 diabetes mellitus with diabetic neuropathy, unspecified: Secondary | ICD-10-CM | POA: Diagnosis not present

## 2023-01-12 DIAGNOSIS — E1121 Type 2 diabetes mellitus with diabetic nephropathy: Secondary | ICD-10-CM | POA: Diagnosis not present

## 2023-01-12 DIAGNOSIS — E785 Hyperlipidemia, unspecified: Secondary | ICD-10-CM | POA: Diagnosis not present

## 2023-01-12 DIAGNOSIS — Z794 Long term (current) use of insulin: Secondary | ICD-10-CM | POA: Diagnosis not present

## 2023-01-12 DIAGNOSIS — E1169 Type 2 diabetes mellitus with other specified complication: Secondary | ICD-10-CM | POA: Diagnosis not present

## 2023-01-12 HISTORY — DX: Unspecified atrial fibrillation: I48.91

## 2023-01-14 ENCOUNTER — Ambulatory Visit (HOSPITAL_COMMUNITY)
Admission: RE | Admit: 2023-01-14 | Discharge: 2023-01-14 | Disposition: A | Discharge status: Home / Self Care | Payer: 59 | Attending: General Surgery | Admitting: General Surgery

## 2023-01-14 ENCOUNTER — Ambulatory Visit (HOSPITAL_COMMUNITY): Payer: 59 | Admitting: Anesthesiology

## 2023-01-14 ENCOUNTER — Encounter (HOSPITAL_COMMUNITY): Payer: Self-pay | Admitting: General Surgery

## 2023-01-14 ENCOUNTER — Encounter (HOSPITAL_COMMUNITY)
Admission: RE | Disposition: A | Discharge status: Home / Self Care | Payer: Self-pay | Source: Home / Self Care | Attending: General Surgery

## 2023-01-14 ENCOUNTER — Ambulatory Visit (HOSPITAL_COMMUNITY): Payer: 59

## 2023-01-14 ENCOUNTER — Ambulatory Visit (HOSPITAL_BASED_OUTPATIENT_CLINIC_OR_DEPARTMENT_OTHER): Payer: 59 | Admitting: Anesthesiology

## 2023-01-14 DIAGNOSIS — N6021 Fibroadenosis of right breast: Secondary | ICD-10-CM | POA: Insufficient documentation

## 2023-01-14 DIAGNOSIS — D241 Benign neoplasm of right breast: Secondary | ICD-10-CM

## 2023-01-14 DIAGNOSIS — N62 Hypertrophy of breast: Secondary | ICD-10-CM | POA: Diagnosis not present

## 2023-01-14 DIAGNOSIS — I428 Other cardiomyopathies: Secondary | ICD-10-CM | POA: Diagnosis not present

## 2023-01-14 DIAGNOSIS — I4891 Unspecified atrial fibrillation: Secondary | ICD-10-CM

## 2023-01-14 DIAGNOSIS — I509 Heart failure, unspecified: Secondary | ICD-10-CM | POA: Diagnosis not present

## 2023-01-14 DIAGNOSIS — Z803 Family history of malignant neoplasm of breast: Secondary | ICD-10-CM | POA: Insufficient documentation

## 2023-01-14 DIAGNOSIS — I110 Hypertensive heart disease with heart failure: Secondary | ICD-10-CM | POA: Insufficient documentation

## 2023-01-14 DIAGNOSIS — Z95810 Presence of automatic (implantable) cardiac defibrillator: Secondary | ICD-10-CM | POA: Diagnosis not present

## 2023-01-14 DIAGNOSIS — I5022 Chronic systolic (congestive) heart failure: Secondary | ICD-10-CM | POA: Insufficient documentation

## 2023-01-14 HISTORY — PX: BREAST BIOPSY WITH RADIO FREQUENCY LOCALIZER: SHX6895

## 2023-01-14 SURGERY — BREAST BIOPSY WITH RADIO FREQUENCY LOCALIZER
Anesthesia: General | Site: Breast | Laterality: Right

## 2023-01-14 MED ORDER — HYDROMORPHONE HCL 1 MG/ML IJ SOLN
0.2500 mg | INTRAMUSCULAR | Status: DC | PRN
  Administered 2023-01-14: 0.5 mg via INTRAVENOUS
  Filled 2023-01-14: qty 0.5

## 2023-01-14 MED ORDER — LIDOCAINE HCL (CARDIAC) PF 100 MG/5ML IV SOSY
PREFILLED_SYRINGE | INTRAVENOUS | Status: DC | PRN
  Administered 2023-01-14: 60 mg via INTRAVENOUS

## 2023-01-14 MED ORDER — BUPIVACAINE HCL (PF) 0.5 % IJ SOLN
INTRAMUSCULAR | Status: DC | PRN
  Administered 2023-01-14: 10 mL

## 2023-01-14 MED ORDER — EPHEDRINE SULFATE (PRESSORS) 50 MG/ML IJ SOLN
INTRAMUSCULAR | Status: DC | PRN
  Administered 2023-01-14: 10 mg via INTRAVENOUS
  Administered 2023-01-14 (×3): 5 mg via INTRAVENOUS

## 2023-01-14 MED ORDER — ONDANSETRON HCL 4 MG/2ML IJ SOLN: 4.0000 mg | Freq: Once | INTRAMUSCULAR | Status: DC | PRN

## 2023-01-14 MED ORDER — CEFAZOLIN SODIUM-DEXTROSE 2-4 GM/100ML-% IV SOLN
INTRAVENOUS | Status: AC
  Filled 2023-01-14: qty 100

## 2023-01-14 MED ORDER — LACTATED RINGERS IV SOLN: INTRAVENOUS | Status: DC

## 2023-01-14 MED ORDER — EPHEDRINE 5 MG/ML INJ
INTRAVENOUS | Status: AC
  Filled 2023-01-14: qty 5

## 2023-01-14 MED ORDER — PHENYLEPHRINE 80 MCG/ML (10ML) SYRINGE FOR IV PUSH (FOR BLOOD PRESSURE SUPPORT)
PREFILLED_SYRINGE | INTRAVENOUS | Status: DC | PRN
  Administered 2023-01-14 (×4): 80 ug via INTRAVENOUS

## 2023-01-14 MED ORDER — CHLORHEXIDINE GLUCONATE CLOTH 2 % EX PADS: 6.0000 | MEDICATED_PAD | Freq: Once | CUTANEOUS | Status: DC

## 2023-01-14 MED ORDER — DEXAMETHASONE SODIUM PHOSPHATE 10 MG/ML IJ SOLN
INTRAMUSCULAR | Status: DC | PRN
  Administered 2023-01-14: 10 mg via INTRAVENOUS

## 2023-01-14 MED ORDER — MIDAZOLAM HCL 2 MG/2ML IJ SOLN
INTRAMUSCULAR | Status: DC | PRN
  Administered 2023-01-14: 2 mg via INTRAVENOUS

## 2023-01-14 MED ORDER — ORAL CARE MOUTH RINSE: 15.0000 mL | Freq: Once | OROMUCOSAL | Status: AC

## 2023-01-14 MED ORDER — CHLORHEXIDINE GLUCONATE 0.12 % MT SOLN
15.0000 mL | Freq: Once | OROMUCOSAL | Status: AC
  Administered 2023-01-14: 15 mL via OROMUCOSAL

## 2023-01-14 MED ORDER — FENTANYL CITRATE (PF) 100 MCG/2ML IJ SOLN
INTRAMUSCULAR | Status: AC
  Filled 2023-01-14: qty 2

## 2023-01-14 MED ORDER — KETOROLAC TROMETHAMINE 30 MG/ML IJ SOLN
30.0000 mg | Freq: Once | INTRAMUSCULAR | Status: AC
  Administered 2023-01-14: 30 mg via INTRAVENOUS
  Filled 2023-01-14: qty 1

## 2023-01-14 MED ORDER — SODIUM CHLORIDE 0.9 % IR SOLN
Status: DC | PRN
  Administered 2023-01-14: 1

## 2023-01-14 MED ORDER — MIDAZOLAM HCL 2 MG/2ML IJ SOLN
INTRAMUSCULAR | Status: AC
  Filled 2023-01-14: qty 2

## 2023-01-14 MED ORDER — PROPOFOL 10 MG/ML IV BOLUS
INTRAVENOUS | Status: DC | PRN
  Administered 2023-01-14: 150 mg via INTRAVENOUS

## 2023-01-14 MED ORDER — PROPOFOL 10 MG/ML IV BOLUS
INTRAVENOUS | Status: AC
  Filled 2023-01-14: qty 20

## 2023-01-14 MED ORDER — DEXAMETHASONE SODIUM PHOSPHATE 10 MG/ML IJ SOLN
INTRAMUSCULAR | Status: AC
  Filled 2023-01-14: qty 1

## 2023-01-14 MED ORDER — BUPIVACAINE HCL (PF) 0.5 % IJ SOLN
INTRAMUSCULAR | Status: AC
  Filled 2023-01-14: qty 30

## 2023-01-14 MED ORDER — ONDANSETRON HCL 4 MG/2ML IJ SOLN
INTRAMUSCULAR | Status: AC
  Filled 2023-01-14: qty 2

## 2023-01-14 MED ORDER — TRAMADOL HCL 50 MG PO TABS: 50.0000 mg | ORAL_TABLET | Freq: Four times a day (QID) | ORAL | 0 refills | Status: DC | PRN

## 2023-01-14 MED ORDER — FENTANYL CITRATE (PF) 250 MCG/5ML IJ SOLN
INTRAMUSCULAR | Status: DC | PRN
  Administered 2023-01-14 (×3): 25 ug via INTRAVENOUS

## 2023-01-14 MED ORDER — PHENYLEPHRINE 80 MCG/ML (10ML) SYRINGE FOR IV PUSH (FOR BLOOD PRESSURE SUPPORT)
PREFILLED_SYRINGE | INTRAVENOUS | Status: AC
  Filled 2023-01-14: qty 10

## 2023-01-14 MED ORDER — ONDANSETRON HCL 4 MG/2ML IJ SOLN
INTRAMUSCULAR | Status: DC | PRN
  Administered 2023-01-14: 4 mg via INTRAVENOUS

## 2023-01-14 MED ORDER — CEFAZOLIN SODIUM-DEXTROSE 2-4 GM/100ML-% IV SOLN
2.0000 g | INTRAVENOUS | Status: AC
  Administered 2023-01-14: 2 g via INTRAVENOUS

## 2023-01-14 MED ORDER — LIDOCAINE HCL (PF) 2 % IJ SOLN
INTRAMUSCULAR | Status: AC
  Filled 2023-01-14: qty 5

## 2023-01-14 SURGICAL SUPPLY — 30 items
ADH SKN CLS APL DERMABOND .7 (GAUZE/BANDAGES/DRESSINGS) ×2
APL PRP STRL LF DISP 70% ISPRP (MISCELLANEOUS) ×1
BLADE SURG 15 STRL LF DISP TIS (BLADE) ×1 IMPLANT
BLADE SURG 15 STRL SS (BLADE) ×1
CHLORAPREP W/TINT 26 (MISCELLANEOUS) ×1 IMPLANT
CLOTH BEACON ORANGE TIMEOUT ST (SAFETY) ×1 IMPLANT
COVER LIGHT HANDLE STERIS (MISCELLANEOUS) ×2 IMPLANT
DECANTER SPIKE VIAL GLASS SM (MISCELLANEOUS) ×1 IMPLANT
DERMABOND ADVANCED .7 DNX12 (GAUZE/BANDAGES/DRESSINGS) ×1 IMPLANT
DEVICE DUBIN W/COMP PLATE 8390 (MISCELLANEOUS) ×1 IMPLANT
ELECT REM PT RETURN 9FT ADLT (ELECTROSURGICAL) ×1
ELECTRODE REM PT RTRN 9FT ADLT (ELECTROSURGICAL) ×1 IMPLANT
GAUZE SPONGE 4X4 16PLY XRAY LF (GAUZE/BANDAGES/DRESSINGS) IMPLANT
GLOVE BIOGEL PI IND STRL 7.0 (GLOVE) ×2 IMPLANT
GLOVE SURG SS PI 7.5 STRL IVOR (GLOVE) ×2 IMPLANT
GOWN STRL REUS W/TWL LRG LVL3 (GOWN DISPOSABLE) ×2 IMPLANT
KIT TURNOVER KIT A (KITS) ×1 IMPLANT
MANIFOLD NEPTUNE II (INSTRUMENTS) ×1 IMPLANT
NDL HYPO 25X1 1.5 SAFETY (NEEDLE) ×1 IMPLANT
NEEDLE HYPO 25X1 1.5 SAFETY (NEEDLE) ×1 IMPLANT
NS IRRIG 1000ML POUR BTL (IV SOLUTION) ×1 IMPLANT
PACK MINOR (CUSTOM PROCEDURE TRAY) ×1 IMPLANT
PAD ARMBOARD 7.5X6 YLW CONV (MISCELLANEOUS) ×1 IMPLANT
SET BASIN LINEN APH (SET/KITS/TRAYS/PACK) ×1 IMPLANT
SET LOCALIZER 20 PROBE US (MISCELLANEOUS) ×1 IMPLANT
SUT MNCRL AB 4-0 PS2 18 (SUTURE) ×1 IMPLANT
SUT SILK 2 0 SH (SUTURE) IMPLANT
SUT VIC AB 3-0 SH 27 (SUTURE) ×2
SUT VIC AB 3-0 SH 27X BRD (SUTURE) IMPLANT
SYR CONTROL 10ML LL (SYRINGE) ×1 IMPLANT

## 2023-01-14 NOTE — Anesthesia Preprocedure Evaluation (Signed)
Anesthesia Evaluation  Patient identified by MRN, date of birth, ID band Patient awake    Reviewed: Allergy & Precautions, H&P , NPO status , Patient's Chart, lab work & pertinent test results  Airway Mallampati: I  TM Distance: >3 FB Neck ROM: Full    Dental  (+) Dental Advisory Given, Missing, Chipped   Pulmonary neg pulmonary ROS   Pulmonary exam normal breath sounds clear to auscultation       Cardiovascular Exercise Tolerance: Good hypertension, Pt. on medications +CHF (Nonischemic cardiomyopathy ([place]))  + dysrhythmias Atrial Fibrillation + Cardiac Defibrillator  Rhythm:Irregular Rate:Normal  1. Left ventricular ejection fraction, by estimation, is 35 to 40%. Left  ventricular ejection fraction by PLAX is 37 %. The left ventricle has  moderately decreased function. The left ventricle demonstrates global  hypokinesis. The left ventricular  internal cavity size was severely dilated. Left ventricular diastolic  parameters are consistent with Grade II diastolic dysfunction  (pseudonormalization).   2. Right ventricular systolic function is mildly reduced. The right  ventricular size is normal.   3. Left atrial size was moderately dilated.   4. The mitral valve is abnormal. Trivial mitral valve regurgitation.   5. The aortic valve is tricuspid. Aortic valve regurgitation is not  visualized.   Comparison(s): No significant change from prior study. 10/14/2021: LVEF  35-40%.     Neuro/Psych  Headaches  negative psych ROS   GI/Hepatic negative GI ROS, Neg liver ROS,,,  Endo/Other  negative endocrine ROS    Renal/GU Renal InsufficiencyRenal disease  negative genitourinary   Musculoskeletal negative musculoskeletal ROS (+)    Abdominal   Peds negative pediatric ROS (+)  Hematology negative hematology ROS (+)   Anesthesia Other Findings   Reproductive/Obstetrics negative OB ROS                              Anesthesia Physical Anesthesia Plan  ASA: 3  Anesthesia Plan: General   Post-op Pain Management: Dilaudid IV   Induction: Intravenous  PONV Risk Score and Plan: 4 or greater and Ondansetron and Dexamethasone  Airway Management Planned: LMA  Additional Equipment:   Intra-op Plan:   Post-operative Plan: Extubation in OR  Informed Consent: I have reviewed the patients History and Physical, chart, labs and discussed the procedure including the risks, benefits and alternatives for the proposed anesthesia with the patient or authorized representative who has indicated his/her understanding and acceptance.     Dental advisory given  Plan Discussed with: CRNA and Surgeon  Anesthesia Plan Comments:         Anesthesia Quick Evaluation

## 2023-01-14 NOTE — Anesthesia Postprocedure Evaluation (Signed)
Anesthesia Post Note  Patient: Anita Warren  Procedure(s) Performed: BREAST BIOPSY WITH RADIO FREQUENCY LOCALIZER (Right: Breast)  Patient location during evaluation: Phase II Anesthesia Type: General Level of consciousness: awake and alert and oriented Pain management: pain level controlled Vital Signs Assessment: post-procedure vital signs reviewed and stable Respiratory status: spontaneous breathing, nonlabored ventilation and respiratory function stable Cardiovascular status: blood pressure returned to baseline and stable Postop Assessment: no apparent nausea or vomiting Anesthetic complications: no  No notable events documented.   Last Vitals:  Vitals:   01/14/23 0930 01/14/23 0947  BP: 108/81 104/75  Pulse: (!) 59 (!) 57  Resp: 13 16  Temp:  (!) 36.4 C  SpO2: 100% 100%    Last Pain:  Vitals:   01/14/23 0947  TempSrc: Oral  PainSc: 0-No pain                 [name] C Marcella Charlson

## 2023-01-14 NOTE — Progress Notes (Signed)
Patient had ICD implant checked via her phone to make sure it was working. Patient had green check and also sent transmission report via her phone. Patient had no issues with the device and confirmed that it was working properly.

## 2023-01-14 NOTE — Transfer of Care (Signed)
Immediate Anesthesia Transfer of Care Note  Patient: Anita Warren  Procedure(s) Performed: BREAST BIOPSY WITH RADIO FREQUENCY LOCALIZER (Right: Breast)  Patient Location: PACU  Anesthesia Type:General  Level of Consciousness: awake, alert , and oriented  Airway & Oxygen Therapy: Patient Spontanous Breathing  Post-op Assessment: Report given to RN and Post -op Vital signs reviewed and stable  Post vital signs: Reviewed and stable  Last Vitals:  Vitals Value Taken Time  BP 113/67   Temp 97.5   Pulse 79   Resp 17   SpO2 100%     Last Pain:  Vitals:   01/14/23 0628  TempSrc: Oral  PainSc: 0-No pain         Complications: No notable events documented.

## 2023-01-14 NOTE — Interval H&P Note (Signed)
History and Physical Interval Note:  01/14/2023 7:06 AM  Anita Warren  has presented today for surgery, with the diagnosis of INTRADUCTAL PAPILLOMA, RIGHT.  The various methods of treatment have been discussed with the patient and family. After consideration of risks, benefits and other options for treatment, the patient has consented to  Procedure(s): BREAST BIOPSY WITH RADIO FREQUENCY LOCALIZER (Right) as a surgical intervention.  The patient's history has been reviewed, patient examined, no change in status, stable for surgery.  I have reviewed the patient's chart and labs.  Questions were answered to the patient's satisfaction.     Aviva Signs

## 2023-01-14 NOTE — Op Note (Signed)
Patient:  Anita Warren  DOB:  01-Aug-1961  MRN:  [id number]   Preop Diagnosis: Intraductal papilloma, right breast  Postop Diagnosis: Same  Procedure: Right breast biopsy after radiofrequency tag placement  Surgeon: Aviva Signs, MD  Anes: General  Indications: Patient is a 62 year old black female who was found on biopsy of the right breast to have an intraductal papilloma.  The patient now presents for right breast biopsy after radiofrequency tag placement.  The risks and benefits of the procedure including bleeding, infection, and the possibility of malignancy were fully explained to the patient, who gave informed consent.  Procedure note: The patient was placed in the supine position.  After general anesthesia was administered, the right breast was prepped and draped using usual sterile technique with ChloraPrep.  Surgical site confirmation was performed.  Using a Hologic finder, an incision was made along the lateral areolar border of the right breast.  The dissection was taken down to the area of concern using the localizer.  A wide swath of tissue was removed and sent for specimen radiography.  The radiofrequency tag was present, but the ribbon was not.  I then took additional tissue circumferentially around the biopsy site.  Specimen radiography again revealed no ribbon clip to be present.  Given the preoperative location of the tag to the clip, I suspect the clip may have fallen out as the breast tissue was very fatty in nature.  I elected to proceed with closure.  The subcutaneous layer was reapproximated using a 3-0 Vicryl interrupted suture.  0.5% Sensorcaine was instilled into the surrounding wound.  The skin was closed using a 4-0 Monocryl subcuticular suture.  Dermabond was applied.  All tape and needle counts were correct at the end of the procedure.  The patient was awakened and transferred to PACU in stable condition.  Complications: Ribbon clip not in specimen  EBL:  None  Specimen: Right breast tissue  Will get follow-up right mammogram postoperatively to check for the ribbon clip.

## 2023-01-14 NOTE — Anesthesia Procedure Notes (Signed)
Procedure Name: LMA Insertion Date/Time: 01/14/2023 7:33 AM  Performed by: Karna Dupes, CRNAPre-anesthesia Checklist: Emergency Drugs available, Patient identified, Suction available and Patient being monitored Patient Re-evaluated:Patient Re-evaluated prior to induction Oxygen Delivery Method: Circle system utilized Preoxygenation: Pre-oxygenation with 100% oxygen Induction Type: IV induction LMA: LMA inserted LMA Size: 4.0 Number of attempts: 1 Placement Confirmation: positive ETCO2 and breath sounds checked- equal and bilateral Tube secured with: Tape Dental Injury: Teeth and Oropharynx as per pre-operative assessment

## 2023-01-16 LAB — SURGICAL PATHOLOGY

## 2023-01-19 ENCOUNTER — Telehealth (INDEPENDENT_AMBULATORY_CARE_PROVIDER_SITE_OTHER): Payer: 59 | Admitting: General Surgery

## 2023-01-19 ENCOUNTER — Other Ambulatory Visit: Payer: Self-pay | Admitting: *Deleted

## 2023-01-19 DIAGNOSIS — Z09 Encounter for follow-up examination after completed treatment for conditions other than malignant neoplasm: Secondary | ICD-10-CM

## 2023-01-19 DIAGNOSIS — D241 Benign neoplasm of right breast: Secondary | ICD-10-CM

## 2023-01-19 NOTE — Telephone Encounter (Signed)
I called the patient's with the results of her biopsy.  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  I told her I want to follow-up mammogram in 2 to 3 months to see whether the small clip is still present as it was not in the specimen removed.  She understands this and will call my office to schedule a follow-up mammogram.

## 2023-01-19 NOTE — Telephone Encounter (Signed)
Orders placed.   Call placed to patient and patient made aware.  

## 2023-01-19 NOTE — Telephone Encounter (Signed)
Received call from patient [phone]~ telephone.   Patient requested order to be placed for F/U mammogram.   Please advise if mammogram is to be bilateral or unilateral (right) and Dx.

## 2023-01-22 ENCOUNTER — Encounter (HOSPITAL_COMMUNITY): Payer: Self-pay | Admitting: General Surgery

## 2023-02-09 ENCOUNTER — Ambulatory Visit: Payer: 59 | Attending: Cardiovascular Disease

## 2023-02-09 DIAGNOSIS — I5022 Chronic systolic (congestive) heart failure: Secondary | ICD-10-CM

## 2023-02-09 DIAGNOSIS — Z95810 Presence of automatic (implantable) cardiac defibrillator: Secondary | ICD-10-CM

## 2023-02-11 NOTE — Progress Notes (Signed)
EPIC Encounter for ICM Monitoring  Patient Name: Anita Warren is a 62 y.o. female Date: 02/11/2023 Primary Care Physican: Elfredia Nevins, MD Primary Cardiologist: Shirlee Latch Electrophysiologist: Mealor 08/12/2022 Weight: 172 lbs 01/06/2023 Weight: 175 lbs                                                            Spoke with patient and heart failure questions reviewed.  Transmission results reviewed.  Pt asymptomatic for fluid accumulation.  Reports feeling well at this time and voices no complaints.     Coruve thoracic impedance suggesting normal fluid levels with exception of possible dryness from 3/25-4/8 and starting 4/11.     Prescribed:  Furosemide 20 mg take 1 tablet (20 mg total) daily.   Potassium 10 mEq take 1 tablet daily. Spironolactone 50 mg take 1 tablet daily     Labs: 12/04/2022 Creatinine 1.50, BUN 25, Potassium 3.6, Sodium 138, GFR 39 A complete set of results can be found in Results Review.   Recommendations:  Advised to drink 64 oz to stay hydrated.  No changes and encouraged to call if experiencing any fluid symptoms.   Follow-up plan: ICM clinic phone appointment on 03/16/2023.   91 day device clinic remote transmission 03/11/2023.      EP/Cardiology Office Visits:  Recall 08/05/2023 with Dr Nelly Laurence.  Encouraged to call HF clinic for 6 month follow up appt.   Recall 01/24/2023 with Dr Shirlee Latch.   Copy of ICM check sent to Dr. Nelly Laurence.      3 month ICM trend: /15/2024.    12-14 Month ICM trend:     [name], RN 02/11/2023 10:20 AM

## 2023-02-22 ENCOUNTER — Other Ambulatory Visit (HOSPITAL_COMMUNITY): Payer: Self-pay | Admitting: Cardiology

## 2023-02-22 DIAGNOSIS — I5022 Chronic systolic (congestive) heart failure: Secondary | ICD-10-CM

## 2023-03-10 ENCOUNTER — Encounter: Payer: 59 | Admitting: Cardiovascular Disease

## 2023-03-11 ENCOUNTER — Ambulatory Visit (INDEPENDENT_AMBULATORY_CARE_PROVIDER_SITE_OTHER): Payer: 59

## 2023-03-11 DIAGNOSIS — I428 Other cardiomyopathies: Secondary | ICD-10-CM

## 2023-03-11 LAB — CUP PACEART REMOTE DEVICE CHECK
Battery Remaining Longevity: 119 mo
Battery Remaining Percentage: 95.5 %
Battery Voltage: 3.07 V
Brady Statistic RV Percent Paced: 1 %
Date Time Interrogation Session: 20240515020127
HighPow Impedance: 37 Ohm
Implantable Lead Connection Status: 753985
Implantable Lead Implant Date: 20120511
Implantable Lead Location: 753860
Implantable Lead Model: 7120
Implantable Pulse Generator Implant Date: 20240209
Lead Channel Impedance Value: 460 Ohm
Lead Channel Pacing Threshold Amplitude: 0.75 V
Lead Channel Pacing Threshold Pulse Width: 0.5 ms
Lead Channel Sensing Intrinsic Amplitude: 10.8 mV
Lead Channel Setting Pacing Amplitude: 2.5 V
Lead Channel Setting Pacing Pulse Width: 0.5 ms
Lead Channel Setting Sensing Sensitivity: 0.5 mV
Pulse Gen Serial Number: 211012829
Zone Setting Status: 755011

## 2023-03-16 ENCOUNTER — Ambulatory Visit: Payer: 59 | Attending: Cardiovascular Disease

## 2023-03-16 DIAGNOSIS — Z95810 Presence of automatic (implantable) cardiac defibrillator: Secondary | ICD-10-CM | POA: Diagnosis not present

## 2023-03-16 DIAGNOSIS — I5022 Chronic systolic (congestive) heart failure: Secondary | ICD-10-CM

## 2023-03-17 NOTE — Progress Notes (Signed)
EPIC Encounter for ICM Monitoring  Patient Name: Anita Warren is a 62 y.o. female Date: 03/17/2023 Primary Care Physican: Elfredia Nevins, MD Primary Cardiologist: Shirlee Latch Electrophysiologist: Mealor 08/12/2022 Weight: 172 lbs 01/06/2023 Weight: 175 lbs 03/17/2023 Weight: 174-176 lbs                                                            Spoke with patient and heart failure questions reviewed.  Transmission results reviewed.  Pt asymptomatic for fluid accumulation.  She has days she feels she is dehydrated so she drinks more fluids.     Coruve thoracic impedance suggesting normal fluid levels with exception of possible fluid accumulation from 5/6-5/15.    Prescribed:  Furosemide 20 mg take 1 tablet (20 mg total) daily.   Potassium 10 mEq take 1 tablet daily. Spironolactone 50 mg take 1 tablet daily     Labs: 12/04/2022 Creatinine 1.50, BUN 25, Potassium 3.6, Sodium 138, GFR 39 A complete set of results can be found in Results Review.   Recommendations:  Advised to drink 64 oz to stay hydrated.  No changes and encouraged to call if experiencing any fluid symptoms.   Follow-up plan: ICM clinic phone appointment on 04/20/2023.   91 day device clinic remote transmission 06/10/2023.      EP/Cardiology Office Visits: 04/10/2023 with Dr Nelly Laurence.  04/23/2023 with Dr Shirlee Latch.   Copy of ICM check sent to Dr. Nelly Laurence.      3 month ICM trend: 03/16/2023.    12-14 Month ICM trend:     [name], RN 03/17/2023 3:34 PM

## 2023-03-18 ENCOUNTER — Other Ambulatory Visit (HOSPITAL_COMMUNITY): Payer: Self-pay | Admitting: Cardiology

## 2023-03-25 ENCOUNTER — Other Ambulatory Visit (HOSPITAL_COMMUNITY): Payer: Self-pay | Admitting: Cardiology

## 2023-03-26 NOTE — Progress Notes (Signed)
Remote ICD transmission.   

## 2023-03-29 ENCOUNTER — Other Ambulatory Visit (HOSPITAL_COMMUNITY): Payer: Self-pay | Admitting: Cardiology

## 2023-04-02 ENCOUNTER — Other Ambulatory Visit (HOSPITAL_COMMUNITY): Payer: Self-pay

## 2023-04-02 DIAGNOSIS — I5022 Chronic systolic (congestive) heart failure: Secondary | ICD-10-CM

## 2023-04-08 ENCOUNTER — Other Ambulatory Visit (HOSPITAL_COMMUNITY): Payer: Self-pay | Admitting: General Surgery

## 2023-04-08 DIAGNOSIS — D249 Benign neoplasm of unspecified breast: Secondary | ICD-10-CM

## 2023-04-10 ENCOUNTER — Ambulatory Visit: Payer: 59 | Attending: Cardiovascular Disease | Admitting: Cardiovascular Disease

## 2023-04-10 ENCOUNTER — Encounter: Payer: Self-pay | Admitting: Cardiovascular Disease

## 2023-04-10 VITALS — BP 108/72 | HR 83 | Ht 64.0 in | Wt 172.0 lb

## 2023-04-10 DIAGNOSIS — I4892 Unspecified atrial flutter: Secondary | ICD-10-CM | POA: Diagnosis not present

## 2023-04-10 DIAGNOSIS — I5022 Chronic systolic (congestive) heart failure: Secondary | ICD-10-CM | POA: Diagnosis not present

## 2023-04-10 LAB — CUP PACEART INCLINIC DEVICE CHECK
Battery Remaining Longevity: 121 mo
Brady Statistic RV Percent Paced: 0.01 %
Date Time Interrogation Session: 20240614143457
HighPow Impedance: 40.9719
Implantable Lead Connection Status: 753985
Implantable Lead Implant Date: 20120511
Implantable Lead Location: 753860
Implantable Lead Model: 7120
Implantable Pulse Generator Implant Date: 20240209
Lead Channel Impedance Value: 512.5 Ohm
Lead Channel Pacing Threshold Amplitude: 0.5 V
Lead Channel Pacing Threshold Amplitude: 0.5 V
Lead Channel Pacing Threshold Amplitude: 0.75 V
Lead Channel Pacing Threshold Pulse Width: 0.5 ms
Lead Channel Pacing Threshold Pulse Width: 0.5 ms
Lead Channel Pacing Threshold Pulse Width: 0.5 ms
Lead Channel Sensing Intrinsic Amplitude: 12 mV
Lead Channel Setting Pacing Amplitude: 2.5 V
Lead Channel Setting Pacing Pulse Width: 0.5 ms
Lead Channel Setting Sensing Sensitivity: 0.5 mV
Pulse Gen Serial Number: 211012829
Zone Setting Status: 755011

## 2023-04-10 NOTE — Patient Instructions (Signed)

## 2023-04-10 NOTE — Progress Notes (Signed)
PCP: Elfredia Nevins, MD Primary Cardiologist: Dr Shirlee Latch Primary EP: Dr Johney Frame  Anita Warren is a 62 y.o. female who presents today for routine electrophysiology followup.    Since last being seen in our clinic, the patient reports doing very well.  She underwent a generator change on Dec 05, 2020.   Today, she denies symptoms of palpitations, chest pain, shortness of breath,  lower extremity edema, dizziness, presyncope, syncope, or ICD shocks.  The patient is otherwise without complaint today.     Past Medical History:  Diagnosis Date   AICD (automatic cardioverter/defibrillator) present    St. Jude AICD implanted 03-07-2011 Dr. Allred/-Dr. Shirlee Latch now follows   Atrial fibrillation (HCC)    Cardiomyopathy (HCC) 2012   CHF (congestive heart failure) (HCC)    meds controlling, no episodes since 2014   Chronic systolic heart failure (HCC)    Essential hypertension, benign    Headache    History of kidney stones    multiple kidney stones in past   History of medication noncompliance    Hydronephrosis with renal and ureteral calculus obstruction 09/05/2013   Nonischemic cardiomyopathy (HCC)    LVEF 5-10%, likely viral (no CAD by cath 01/30/11)   NSVT (nonsustained ventricular tachycardia) (HCC)    Obesity      ROS- all systems are reviewed and negative except as per HPI above  Current Outpatient Medications  Medication Sig Dispense Refill   acetaminophen (TYLENOL) 500 MG tablet Take 1,000 mg by mouth every 6 (six) hours as needed for headache.      apixaban (ELIQUIS) 5 MG TABS tablet Take 1 tablet (5 mg total) by mouth 2 (two) times daily. 180 tablet 3   carvedilol (COREG) 25 MG tablet TAKE 1 TABLET BY MOUTH TWICE DAILY WITH A MEAL 180 tablet 3   Coenzyme Q10 (CO Q 10) 100 MG CAPS Take 100 mg by mouth in the morning.     dapagliflozin propanediol (FARXIGA) 10 MG TABS tablet Take 1 tablet (10 mg total) by mouth daily before breakfast. (Patient taking differently: Take 10  mg by mouth daily before breakfast.) 90 tablet 3   diphenhydrAMINE (BENADRYL) 25 MG tablet Take 25 mg by mouth every 6 (six) hours as needed (headache).      furosemide (LASIX) 20 MG tablet Take 1 tablet (20 mg total) by mouth daily. 90 tablet 3   hydrALAZINE (APRESOLINE) 100 MG tablet Take 1 tablet (100 mg total) by mouth 3 (three) times daily. NEEDS FOLLOW UP APPOINTMENT FOR ANYMORE REFILLS 270 tablet 0   isosorbide mononitrate (IMDUR) 30 MG 24 hr tablet Take 3 tablets by mouth once daily 270 tablet 0   potassium chloride (KLOR-CON) 10 MEQ tablet Take 1 tablet (10 mEq total) by mouth daily. NEEDS FOLLOW UP APPOINTMENT FOR ANYMORE REFILLS 90 tablet 0   spironolactone (ALDACTONE) 50 MG tablet Take 1 tablet by mouth once daily 90 tablet 0   traMADol (ULTRAM) 50 MG tablet Take 1 tablet (50 mg total) by mouth every 6 (six) hours as needed. 15 tablet 0   No current facility-administered medications for this visit.    Physical Exam: There were no vitals filed for this visit.   GEN- The patient is well appearing, alert and oriented x 3 today.   Head- normocephalic, atraumatic Eyes-  Sclera clear, conjunctiva pink Lungs- Clear to ausculation bilaterally, normal work of breathing Chest- ICD pocket is well healed Heart- Regular rate and rhythm, no murmurs, rubs or gallops, PMI not laterally displaced  Extremities- no clubbing, cyanosis, or edema  ICD interrogation- reviewed in detail today,  See PACEART report  ekg tracing ordered today is personally reviewed and shows sinus rhythm, narrow QRS  TTE 10/14/2021   LVEF 35-40% Wt Readings from Last 3 Encounters:  01/14/23 175 lb (79.4 kg)  12/05/22 178 lb (80.7 kg)  12/04/22 178 lb (80.7 kg)    Assessment and Plan:  1.  Chronic systolic dysfunction/ nonischemic CM euvolemic today Stable on an appropriate medical regimen Normal ICD function - occasional low defib impedances, appears chronic See Pace Art report No changes today she is not  device dependant today followed in ICM device clinic TTE ordered for tomorrow   2. Remote atypical atrial flutter No arrhythmias in past 2 years off AAD therapy Chads2vasc score is 3.  She is on eliquis  3. HTN Stable No change required today  Return in a year  Maurice Small, MD 04/10/2023 12:34 PM

## 2023-04-14 ENCOUNTER — Telehealth (INDEPENDENT_AMBULATORY_CARE_PROVIDER_SITE_OTHER): Payer: 59 | Admitting: General Surgery

## 2023-04-14 ENCOUNTER — Encounter (HOSPITAL_COMMUNITY): Payer: Self-pay

## 2023-04-14 ENCOUNTER — Ambulatory Visit (HOSPITAL_COMMUNITY)
Admission: RE | Admit: 2023-04-14 | Discharge: 2023-04-14 | Disposition: A | Discharge status: Home / Self Care | Payer: 59 | Source: Ambulatory Visit | Attending: General Surgery | Admitting: General Surgery

## 2023-04-14 DIAGNOSIS — Z09 Encounter for follow-up examination after completed treatment for conditions other than malignant neoplasm: Secondary | ICD-10-CM | POA: Insufficient documentation

## 2023-04-14 DIAGNOSIS — D241 Benign neoplasm of right breast: Secondary | ICD-10-CM | POA: Insufficient documentation

## 2023-04-14 DIAGNOSIS — R92321 Mammographic fibroglandular density, right breast: Secondary | ICD-10-CM | POA: Diagnosis not present

## 2023-04-14 DIAGNOSIS — D249 Benign neoplasm of unspecified breast: Secondary | ICD-10-CM | POA: Insufficient documentation

## 2023-04-14 NOTE — Telephone Encounter (Signed)
Patient was notified of her follow-up mammogram report.  No clip was remaining in the breast.  Continue screening mammograms through primary care office.  Follow-up here as needed.

## 2023-04-20 ENCOUNTER — Ambulatory Visit: Payer: 59 | Attending: Cardiovascular Disease

## 2023-04-20 DIAGNOSIS — Z95810 Presence of automatic (implantable) cardiac defibrillator: Secondary | ICD-10-CM

## 2023-04-20 DIAGNOSIS — I5022 Chronic systolic (congestive) heart failure: Secondary | ICD-10-CM

## 2023-04-23 ENCOUNTER — Ambulatory Visit (HOSPITAL_BASED_OUTPATIENT_CLINIC_OR_DEPARTMENT_OTHER)
Admission: RE | Admit: 2023-04-23 | Discharge: 2023-04-23 | Disposition: A | Discharge status: Home / Self Care | Payer: 59 | Source: Ambulatory Visit | Attending: Cardiology | Admitting: Cardiology

## 2023-04-23 ENCOUNTER — Ambulatory Visit (HOSPITAL_COMMUNITY)
Admission: RE | Admit: 2023-04-23 | Discharge: 2023-04-23 | Disposition: A | Discharge status: Home / Self Care | Payer: 59 | Source: Ambulatory Visit | Attending: Cardiology | Admitting: Cardiology

## 2023-04-23 ENCOUNTER — Encounter (HOSPITAL_COMMUNITY): Payer: Self-pay | Admitting: Cardiology

## 2023-04-23 VITALS — BP 102/64 | HR 62 | Wt 175.6 lb

## 2023-04-23 DIAGNOSIS — N183 Chronic kidney disease, stage 3 unspecified: Secondary | ICD-10-CM | POA: Diagnosis not present

## 2023-04-23 DIAGNOSIS — I130 Hypertensive heart and chronic kidney disease with heart failure and stage 1 through stage 4 chronic kidney disease, or unspecified chronic kidney disease: Secondary | ICD-10-CM | POA: Diagnosis not present

## 2023-04-23 DIAGNOSIS — I428 Other cardiomyopathies: Secondary | ICD-10-CM | POA: Diagnosis not present

## 2023-04-23 DIAGNOSIS — Z79899 Other long term (current) drug therapy: Secondary | ICD-10-CM | POA: Insufficient documentation

## 2023-04-23 DIAGNOSIS — I5022 Chronic systolic (congestive) heart failure: Secondary | ICD-10-CM

## 2023-04-23 DIAGNOSIS — I3139 Other pericardial effusion (noninflammatory): Secondary | ICD-10-CM | POA: Insufficient documentation

## 2023-04-23 DIAGNOSIS — Z7901 Long term (current) use of anticoagulants: Secondary | ICD-10-CM | POA: Diagnosis not present

## 2023-04-23 DIAGNOSIS — I4892 Unspecified atrial flutter: Secondary | ICD-10-CM | POA: Diagnosis not present

## 2023-04-23 DIAGNOSIS — Z8249 Family history of ischemic heart disease and other diseases of the circulatory system: Secondary | ICD-10-CM | POA: Insufficient documentation

## 2023-04-23 DIAGNOSIS — Z95810 Presence of automatic (implantable) cardiac defibrillator: Secondary | ICD-10-CM | POA: Diagnosis not present

## 2023-04-23 LAB — ECHOCARDIOGRAM COMPLETE
AR max vel: 1.15 cm2
AV Area VTI: 1.25 cm2
AV Area mean vel: 1.17 cm2
AV Mean grad: 5 mmHg
AV Peak grad: 9.7 mmHg
Ao pk vel: 1.56 m/s
Area-P 1/2: 3.85 cm2
Calc EF: 39.5 %
Est EF: 35
S' Lateral: 5.2 cm
Single Plane A2C EF: 38.9 %
Single Plane A4C EF: 37.3 %

## 2023-04-23 LAB — BASIC METABOLIC PANEL
Anion gap: 8 (ref 5–15)
BUN: 24 mg/dL — ABNORMAL HIGH (ref 8–23)
CO2: 25 mmol/L (ref 22–32)
Calcium: 9.5 mg/dL (ref 8.9–10.3)
Chloride: 104 mmol/L (ref 98–111)
Creatinine, Ser: 1.44 mg/dL — ABNORMAL HIGH (ref 0.44–1.00)
GFR, Estimated: 41 mL/min — ABNORMAL LOW (ref >60)
Glucose, Bld: 111 mg/dL — ABNORMAL HIGH (ref 70–99)
Potassium: 4.1 mmol/L (ref 3.5–5.1)
Sodium: 137 mmol/L (ref 135–145)

## 2023-04-23 LAB — BRAIN NATRIURETIC PEPTIDE: B Natriuretic Peptide: 26.3 pg/mL (ref 0.0–100.0)

## 2023-04-23 LAB — CBC
HCT: 35.4 % — ABNORMAL LOW (ref 36.0–46.0)
Hemoglobin: 11 g/dL — ABNORMAL LOW (ref 12.0–15.0)
MCH: 28.5 pg (ref 26.0–34.0)
MCHC: 31.1 g/dL (ref 30.0–36.0)
MCV: 91.7 fL (ref 80.0–100.0)
Platelets: 188 10*3/uL (ref 150–400)
RBC: 3.86 MIL/uL — ABNORMAL LOW (ref 3.87–5.11)
RDW: 15.8 % — ABNORMAL HIGH (ref 11.5–15.5)
WBC: 6.7 10*3/uL (ref 4.0–10.5)
nRBC: 0 % (ref 0.0–0.2)

## 2023-04-23 NOTE — Patient Instructions (Signed)
There has been no changes to your medications.  Labs done today, your results will be available in MyChart, we will contact you for abnormal readings.  Your physician recommends that you schedule a follow-up appointment in: 4 months  If you have any questions or concerns before your next appointment please send us a message through mychart or call our office at [phone].    TO LEAVE A MESSAGE FOR THE NURSE SELECT OPTION 2, PLEASE LEAVE A MESSAGE INCLUDING: YOUR NAME DATE OF BIRTH CALL BACK NUMBER REASON FOR CALL**this is important as we prioritize the call backs  YOU WILL RECEIVE A CALL BACK THE SAME DAY AS LONG AS YOU CALL BEFORE 4:00 PM  At the [facility], you and your health needs are our priority. As part of our continuing mission to provide you with exceptional heart care, we have created designated Provider Care Teams. These Care Teams include your primary Cardiologist (physician) and Advanced Practice Providers (APPs- Physician Assistants and Nurse Practitioners) who all work together to provide you with the care you need, when you need it.   You may see any of the following providers on your designated Care Team at your next follow up: Dr Daniel Bensimhon Dr Dalton McLean Dr. Aditya Sabharwal Amy Clegg, NP Brittainy Simmons, PA Jessica Milford,NP Lindsay Finch, PA Alma Diaz, NP Lauren Kemp, PharmD   Please be sure to bring in all your medications bottles to every appointment.    Thank you for choosing [facility]    

## 2023-04-23 NOTE — Progress Notes (Signed)
Patient ID: Anita Warren, female   DOB: [birth year], 62 y.o.   MRN: [id number]     [facility] Note   Primary Physician: Cassell Smiles., MD  HF Cardiology: Dr. Shirlee Latch EP: Dr Allred  Urology: Dr Isabel Caprice  HPI: Anita Warren is a 62 y.o. female with history of viral, non-ischemic cardiomyopathy, severe systolic dysfunction with EF of 10% s/p ICD pacemaker in the setting of VT 02/2011 (St. Jude);  cath 2012 with no CAD.   Admitted 7/15 with acute on chronic systolic CHF she was not taking medications for 4 months because she lost her insurance. She was diuresed with IV lasix and transitioned to lasix 40 mg daily. She was also discharged on 12.5 mg carvedilol twice a day, hydralazine 25 mg tid, and 30 mg Imdur daily. Discharge weight was 176 pounds. She was not on an ACEI due to angioedema.  Admitted from HF clinic 06/02/14 with low output heart failure. Swan placed and showed cardiogenic shock. Ultimately discharged on  Milrinone 0.25 mcg/kg/min. Hospital stay was complicated by atrial flutter. Loaded on amiodarone and started on eliquis 5 mg twice a day. Met with Dr Donata Clay and VAD coordinator. She was titrated off the milrinone.  Echo in 8/17 showed EF up to 50%, echo in 8/18 showed EF 45%.  Echo in 11/19 showed EF 35-40%. Echo in 11/21 showed EF stable 40% with normal RV.  Echo in 12/22 showed EF 35-40%, moderate LV dilation, mildly decreased RV systolic function, moderate pericardial effusion.   Echo was done today, EF 35% with diffuse hypokinesis, mildly decreased RV systolic function, IVC normal.   She presents for followup of CHF.  No significant exertional dyspnea.  She can walk up a flight of stairs with no problems.  No lightheadedness.  No chest pain.  No orthopnea/PND.  She has gotten married since I last saw her.     ECG (personally reviewed): NSR, LVH  05/23/14 ECHO EF 5-10% RV mod to severely dilated.  08/09/14 ECHO EF 20-25% RV read as normal 8/17  ECHO EF 50%, mild LV dilation, normal RV size and systolic function.  8/18 ECHO EF 45%, diffuse HK worse inferiorly, normal RV size and systolic function.  11/19 ECHO EF 35-40%, mild LV dilation with diffuse hypokinesis, normal RV size with mildly decreased systolic function.  11/21 ECHO EF 40%, hypokinesis of the basal to mid septum and the basal inferior wall, RV normal, small pericardial effusion.  12/22 ECHO EF 35-40%, moderate LV dilation, mildly decreased RV systolic function, moderate pericardial effusion. 6/24 ECHO EF 35% with diffuse hypokinesis, mildly decreased RV systolic function, IVC normal.   Labs:  HIV negative 2015  9/15: K 3.6 => 4.6, creatinine 0.99 => 1.5 => 1.2, Mg 1.9, HCT 45.2, LFTs normal, TSH normal 9/15: PFTs normal 07/31/14: Co-ox off milrinone 66%  K 4.5 Cr 1.36 08/23/14: k 4.4, cr 1.51 digoxin 0.7 09/20/14: K 4.9, creatinine 1.64  12/15: K 4.4, creatinine 1.36 2/16: K 4.2, creatinine 1.67, HCT 33.9 04/28/2016: K 4.1 Creatinine 2.67  8/17: TSH normal, LFTs normal 9/17: K 3.5, creatinine 1.67, hgb 9.9 5/18: hgb 10.7, K 3.8, creatinine 1.45 9/18: K 4.4, creatinine 1.53 4/19: K 3.8, creatinine 1.32, hgb 10.5 6/21: K 4.3, creatinine 1.34, LFTs normal 7/21: hgb 10.4 8/21: K 3.9, creatinine 1.38 11/21: K 3.8, creatinine 1.36 11/22: K 3.7, creatinine 1.56, BNP 29 2/24: K 3.6, creatinine 1.5  2013 Colonoscopy Blood Type:  B+  Lower Extremity Doppler - Normal arterial  flow Carotid Doppler-  1-39% stenosis involving the right internal carotid artery and the left internal carotid Artery.  SH: Disabled. Has 3 grown children. Does not smoke or drink alcohol.  Not married (fiance passed away in May 16, 2020). 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    FH: Mom died at 90 CAD         Sister and Brother HTN   Review of systems complete and found to be negative unless listed in HPI.    Past Medical History:  Diagnosis Date   AICD (automatic cardioverter/defibrillator) present     St. Jude AICD implanted 03-07-2011 Dr. Allred/-Dr. Shirlee Latch now follows   Atrial fibrillation (HCC)    Cardiomyopathy (HCC) 05/17/2011   CHF (congestive heart failure) (HCC)    meds controlling, no episodes since May 16, 2013   Chronic systolic heart failure (HCC)    Essential hypertension, benign    Headache    History of kidney stones    multiple kidney stones in past   History of medication noncompliance    Hydronephrosis with renal and ureteral calculus obstruction 09/05/2013   Nonischemic cardiomyopathy (HCC)    LVEF 5-10%, likely viral (no CAD by cath 01/30/11)   NSVT (nonsustained ventricular tachycardia) (HCC)    Obesity     Current Outpatient Medications  Medication Sig Dispense Refill   acetaminophen (TYLENOL) 500 MG tablet Take 1,000 mg by mouth every 6 (six) hours as needed for headache.      apixaban (ELIQUIS) 5 MG TABS tablet Take 1 tablet (5 mg total) by mouth 2 (two) times daily. 180 tablet 3   carvedilol (COREG) 25 MG tablet TAKE 1 TABLET BY MOUTH TWICE DAILY WITH A MEAL 180 tablet 3   Coenzyme Q10 (CO Q 10) 100 MG CAPS Take 100 mg by mouth in the morning.     dapagliflozin propanediol (FARXIGA) 10 MG TABS tablet Take 1 tablet (10 mg total) by mouth daily before breakfast. 90 tablet 3   diphenhydrAMINE (BENADRYL) 25 MG tablet Take 25 mg by mouth every 6 (six) hours as needed (headache).      furosemide (LASIX) 20 MG tablet Take 1 tablet (20 mg total) by mouth daily. 90 tablet 3   hydrALAZINE (APRESOLINE) 100 MG tablet Take 1 tablet (100 mg total) by mouth 3 (three) times daily. NEEDS FOLLOW UP APPOINTMENT FOR ANYMORE REFILLS 270 tablet 0   isosorbide mononitrate (IMDUR) 30 MG 24 hr tablet Take 3 tablets by mouth once daily 270 tablet 0   potassium chloride (KLOR-CON) 10 MEQ tablet Take 1 tablet (10 mEq total) by mouth daily. NEEDS FOLLOW UP APPOINTMENT FOR ANYMORE REFILLS 90 tablet 0   spironolactone (ALDACTONE) 50 MG tablet Take 1 tablet by mouth once daily 90 tablet 0   traMADol  (ULTRAM) 50 MG tablet Take 1 tablet (50 mg total) by mouth every 6 (six) hours as needed. 15 tablet 0   No current facility-administered medications for this encounter.   PHYSICAL EXAM: Vitals:   04/23/23 0851  BP: 102/64  Pulse: 62  SpO2: 99%  Weight: 79.7 kg (175 lb 9.6 oz)    Wt Readings from Last 3 Encounters:  04/23/23 79.7 kg (175 lb 9.6 oz)  04/10/23 78 kg (172 lb)  01/14/23 79.4 kg (175 lb)    General: NAD Neck: No JVD, no thyromegaly or thyroid nodule.  Lungs: Clear to auscultation bilaterally with normal respiratory effort. CV: Nondisplaced PMI.  Heart regular S1/S2, no S3/S4, no murmur.  No peripheral edema.  No carotid bruit.  Normal pedal pulses.  Abdomen: Soft, nontender, no hepatosplenomegaly, no distention.  Skin: Intact without lesions or rashes.  Neurologic: Alert and oriented x 3.  Psych: Normal affect. Extremities: No clubbing or cyanosis.  HEENT: Normal.   ASSESSMENT & PLAN: 1. Chronic Systolic Heart Failure: Nonischemic cardiomyopathy thought to potentially be due to viral myocarditis noted initially in 2011. Cath 2012 normal coronaries.  St Jude ICD. Echo (7/15) with EF 5-10% and RV mod-severely dilated with moderately decreased systolic function. Repeat echo 10/15 with EF up to 20-25%. She was on milrinone briefly then stopped.  Echo in 8/17 with EF up to 50%. Repeat echo 11/19 showed EF back down to 35-40% with normal RV. Echo in 11/21 showed stable EF 40% and septal + inferior wall motion abnormalities.  Echo in 12/22 showed EF 35-40%, mild RV dysfunction, moderate pericardial effusion.  Echo today showed EF 35%, mild RV dysfunction. NYHA class I-II, she is not volume overloaded by exam.  - Continue Lasix 20 mg daily, BMET/BNP today.   - Continue spironolactone 50 mg daily.  - No ACEI/ARB/ARNI due to recurrent episodes of angioedema with ACEI and also ARB.  - Continue Coreg 25 mg bid.   - Continue hydralazine 100 mg tid and Imdur 90 mg daily.   2. Atrial  flutter: Paroxysmal, NSR today.  - She is now off amiodarone.  If atrial flutter recurs, should have ablation.  - Continue Eliquis 5 mg BID.  3. Angioedema: Had in past on ACEI, had another episode after ACEI stopped.  She is no longer on ACEI, ARNI, or ARB. ?Hereditary or acquired C1 inhibitor deficiency. No further problems with this. 4. CKD: Stage 3 - BMET today.    Followup in 4 months with APP.   Marca Ancona, MD  04/23/2023

## 2023-04-24 NOTE — Progress Notes (Signed)
EPIC Encounter for ICM Monitoring  Patient Name: Anita Warren is a 62 y.o. female Date: 04/24/2023 Primary Care Physican: Elfredia Nevins, MD Primary Cardiologist: Shirlee Latch Electrophysiologist: Mealor 08/12/2022 Weight: 172 lbs 01/06/2023 Weight: 175 lbs 03/17/2023 Weight: 174-176 lbs                                                            Spoke with patient and heart failure questions reviewed.  Transmission results reviewed.  Pt asymptomatic for fluid accumulation.  Fluid intake is not consistent so some days she drinks a lot and other days she drinks less than recommended amount.     Coruve thoracic impedance suggesting normal fluid levels with exception of possible fluid accumulation from 6/10-6/19 and 6/24-6/26.    Prescribed:  Furosemide 20 mg take 1 tablet (20 mg total) daily.   Potassium 10 mEq take 1 tablet daily. Spironolactone 50 mg take 1 tablet daily     Labs: 04/23/2023 Creatinine 1.44, BUN 24, Potassium 4.1, Sodium 137, GFR 41 12/04/2022 Creatinine 1.50, BUN 25, Potassium 3.6, Sodium 138, GFR 39 A complete set of results can be found in Results Review.   Recommendations:   No changes and encouraged to call if experiencing any fluid symptoms.   Follow-up plan: ICM clinic phone appointment on 7/294/2024.   91 day device clinic remote transmission 06/10/2023.      EP/Cardiology Office Visits: 6/62025 with Dr Nelly Laurence.  08/24/2023 with Dr Shirlee Latch.   Copy of ICM check sent to Dr. Nelly Laurence.      3 month ICM trend: 04/23/2023.    12-14 Month ICM trend:     [name], RN 04/24/2023 3:33 PM

## 2023-05-22 ENCOUNTER — Other Ambulatory Visit (HOSPITAL_COMMUNITY): Payer: Self-pay | Admitting: Cardiology

## 2023-05-22 DIAGNOSIS — I5022 Chronic systolic (congestive) heart failure: Secondary | ICD-10-CM

## 2023-05-25 ENCOUNTER — Ambulatory Visit: Payer: 59 | Attending: Cardiovascular Disease

## 2023-05-25 DIAGNOSIS — Z95810 Presence of automatic (implantable) cardiac defibrillator: Secondary | ICD-10-CM

## 2023-05-25 DIAGNOSIS — I5022 Chronic systolic (congestive) heart failure: Secondary | ICD-10-CM | POA: Diagnosis not present

## 2023-05-27 NOTE — Progress Notes (Signed)
EPIC Encounter for ICM Monitoring  Patient Name: Anita Warren is a 62 y.o. female Date: 05/27/2023 Primary Care Physican: Elfredia Nevins, MD Primary Cardiologist: Shirlee Latch Electrophysiologist: Mealor 08/12/2022 Weight: 172 lbs 01/06/2023 Weight: 175 lbs 03/17/2023 Weight: 174-176 lbs 05/27/2023 Weight: 174 lbs                                                            Spoke with patient and heart failure questions reviewed.  Transmission results reviewed.  Pt asymptomatic for fluid accumulation.  She was sick with a cold during dryness and drinking less fluids.   Coruve thoracic impedance suggesting normal fluid levels with exception of possible fluid accumulation from 7/14-7/21 and dryness 7/24-7/29.    Prescribed:  Furosemide 20 mg take 1 tablet (20 mg total) daily.   Potassium 10 mEq take 1 tablet daily. Spironolactone 50 mg take 1 tablet daily     Labs: 04/23/2023 Creatinine 1.44, BUN 24, Potassium 4.1, Sodium 137, GFR 41 12/04/2022 Creatinine 1.50, BUN 25, Potassium 3.6, Sodium 138, GFR 39 A complete set of results can be found in Results Review.   Recommendations:   No changes and encouraged to call if experiencing any fluid symptoms.   Follow-up plan: ICM clinic phone appointment on 06/30/2023.   91 day device clinic remote transmission 06/10/2023.      EP/Cardiology Office Visits: 04/01/2024 with Dr Nelly Laurence.  08/24/2023 with Dr Shirlee Latch.   Copy of ICM check sent to Dr. Nelly Laurence.   3 month ICM trend: 05/25/2023.    12-14 Month ICM trend:     [name], RN 05/27/2023 2:01 PM

## 2023-06-10 ENCOUNTER — Ambulatory Visit (INDEPENDENT_AMBULATORY_CARE_PROVIDER_SITE_OTHER): Payer: 59

## 2023-06-10 DIAGNOSIS — I428 Other cardiomyopathies: Secondary | ICD-10-CM

## 2023-06-11 LAB — CUP PACEART REMOTE DEVICE CHECK
Battery Remaining Longevity: 115 mo
Battery Remaining Percentage: 93 %
Battery Voltage: 3.02 V
Brady Statistic RV Percent Paced: 1 %
Date Time Interrogation Session: 20240815144424
HighPow Impedance: 42 Ohm
Implantable Lead Connection Status: 753985
Implantable Lead Implant Date: 20120511
Implantable Lead Location: 753860
Implantable Lead Model: 7120
Implantable Pulse Generator Implant Date: 20240209
Lead Channel Impedance Value: 460 Ohm
Lead Channel Pacing Threshold Amplitude: 0.5 V
Lead Channel Pacing Threshold Pulse Width: 0.5 ms
Lead Channel Sensing Intrinsic Amplitude: 10.9 mV
Lead Channel Setting Pacing Amplitude: 2.5 V
Lead Channel Setting Pacing Pulse Width: 0.5 ms
Lead Channel Setting Sensing Sensitivity: 0.5 mV
Pulse Gen Serial Number: 211012829
Zone Setting Status: 755011

## 2023-06-12 ENCOUNTER — Other Ambulatory Visit (HOSPITAL_COMMUNITY): Payer: Self-pay | Admitting: Cardiology

## 2023-06-13 ENCOUNTER — Other Ambulatory Visit: Payer: Self-pay | Admitting: Cardiology

## 2023-06-17 ENCOUNTER — Other Ambulatory Visit (HOSPITAL_COMMUNITY): Payer: Self-pay | Admitting: Cardiology

## 2023-06-23 ENCOUNTER — Other Ambulatory Visit (HOSPITAL_COMMUNITY): Payer: Self-pay | Admitting: Cardiology

## 2023-06-24 NOTE — Progress Notes (Signed)
Remote ICD transmission.   

## 2023-06-30 ENCOUNTER — Ambulatory Visit: Payer: 59 | Attending: Cardiovascular Disease

## 2023-06-30 DIAGNOSIS — I5022 Chronic systolic (congestive) heart failure: Secondary | ICD-10-CM | POA: Diagnosis not present

## 2023-06-30 DIAGNOSIS — Z95810 Presence of automatic (implantable) cardiac defibrillator: Secondary | ICD-10-CM | POA: Diagnosis not present

## 2023-07-03 NOTE — Progress Notes (Signed)
EPIC Encounter for ICM Monitoring  Patient Name: Anita Warren is a 61 y.o. female Date: 07/03/2023 Primary Care Physican: Elfredia Nevins, MD Primary Cardiologist: Shirlee Latch Electrophysiologist: Mealor 01/06/2023 Weight: 175 lbs 03/17/2023 Weight: 174-176 lbs 05/27/2023 Weight: 174 lbs                                                            Spoke with patient and heart failure questions reviewed.  Transmission results reviewed.  Pt asymptomatic for fluid accumulation.  She thinks she is drinking too much fluid.     Coruve thoracic impedance suggesting normal fluid levels with exception of possible fluid accumulation from 8/5-8/14 and 8/24-8/31.    Prescribed:  Furosemide 20 mg take 1 tablet (20 mg total) daily.   Potassium 10 mEq take 1 tablet daily. Spironolactone 50 mg take 1 tablet daily     Labs: 04/23/2023 Creatinine 1.44, BUN 24, Potassium 4.1, Sodium 137, GFR 41 12/04/2022 Creatinine 1.50, BUN 25, Potassium 3.6, Sodium 138, GFR 39 A complete set of results can be found in Results Review.   Recommendations: Encouraged her to limit fluid intake to 64 oz daily.    No changes and encouraged to call if experiencing any fluid symptoms.   Follow-up plan: ICM clinic phone appointment on 08/03/2023.   91 day device clinic remote transmission 09/09/2023.      EP/Cardiology Office Visits: 04/01/2024 with Dr Nelly Laurence.  08/24/2023 with Dr Shirlee Latch.   Copy of ICM check sent to Dr. Nelly Laurence.   3 month ICM trend: 06/30/2023.    12-14 Month ICM trend:     [name], RN 07/03/2023 10:31 AM

## 2023-07-13 DIAGNOSIS — I4891 Unspecified atrial fibrillation: Secondary | ICD-10-CM | POA: Diagnosis not present

## 2023-07-13 DIAGNOSIS — E559 Vitamin D deficiency, unspecified: Secondary | ICD-10-CM | POA: Diagnosis not present

## 2023-07-13 DIAGNOSIS — I5022 Chronic systolic (congestive) heart failure: Secondary | ICD-10-CM | POA: Diagnosis not present

## 2023-07-13 DIAGNOSIS — I130 Hypertensive heart and chronic kidney disease with heart failure and stage 1 through stage 4 chronic kidney disease, or unspecified chronic kidney disease: Secondary | ICD-10-CM | POA: Diagnosis not present

## 2023-07-13 DIAGNOSIS — I428 Other cardiomyopathies: Secondary | ICD-10-CM | POA: Diagnosis not present

## 2023-07-13 DIAGNOSIS — D518 Other vitamin B12 deficiency anemias: Secondary | ICD-10-CM | POA: Diagnosis not present

## 2023-07-13 DIAGNOSIS — Z0001 Encounter for general adult medical examination with abnormal findings: Secondary | ICD-10-CM | POA: Diagnosis not present

## 2023-07-13 DIAGNOSIS — I10 Essential (primary) hypertension: Secondary | ICD-10-CM | POA: Diagnosis not present

## 2023-07-13 DIAGNOSIS — N1831 Chronic kidney disease, stage 3a: Secondary | ICD-10-CM | POA: Diagnosis not present

## 2023-07-13 DIAGNOSIS — Z23 Encounter for immunization: Secondary | ICD-10-CM | POA: Diagnosis not present

## 2023-07-13 DIAGNOSIS — E039 Hypothyroidism, unspecified: Secondary | ICD-10-CM | POA: Diagnosis not present

## 2023-07-19 ENCOUNTER — Other Ambulatory Visit (HOSPITAL_COMMUNITY): Payer: Self-pay | Admitting: Cardiology

## 2023-07-19 DIAGNOSIS — I5022 Chronic systolic (congestive) heart failure: Secondary | ICD-10-CM

## 2023-08-03 ENCOUNTER — Ambulatory Visit: Payer: 59

## 2023-08-03 DIAGNOSIS — I5022 Chronic systolic (congestive) heart failure: Secondary | ICD-10-CM | POA: Diagnosis not present

## 2023-08-03 DIAGNOSIS — Z95810 Presence of automatic (implantable) cardiac defibrillator: Secondary | ICD-10-CM | POA: Diagnosis not present

## 2023-08-07 NOTE — Progress Notes (Signed)
EPIC Encounter for ICM Monitoring  Patient Name: Anita Warren is a 62 y.o. female Date: 08/07/2023 Primary Care Physican: Elfredia Nevins, MD Primary Cardiologist: Shirlee Latch Electrophysiologist: Mealor 01/06/2023 Weight: 175 lbs 03/17/2023 Weight: 174-176 lbs 05/27/2023 Weight: 174 lbs 08/07/2023 Weight: 174 lbs                                                            Spoke with patient and heart failure questions reviewed.  Transmission results reviewed.  Pt asymptomatic for fluid accumulation.     Coruve thoracic impedance suggesting normal fluid levels with exception of possible fluid accumulation from 9/16-9/23.    Prescribed:  Furosemide 20 mg take 1 tablet (20 mg total) daily.   Potassium 10 mEq take 1 tablet daily. Spironolactone 50 mg take 1 tablet daily     Labs: 04/23/2023 Creatinine 1.44, BUN 24, Potassium 4.1, Sodium 137, GFR 41 12/04/2022 Creatinine 1.50, BUN 25, Potassium 3.6, Sodium 138, GFR 39 A complete set of results can be found in Results Review.   Recommendations: Encouraged her to limit fluid intake to 64 oz daily.    No changes and encouraged to call if experiencing any fluid symptoms.   Follow-up plan: ICM clinic phone appointment on 09/07/2023.   91 day device clinic remote transmission 09/09/2023.      EP/Cardiology Office Visits: 04/01/2024 with Dr Nelly Laurence.  08/24/2023 with Dr Shirlee Latch.   Copy of ICM check sent to Dr. Nelly Laurence.   3 month ICM trend: 08/04/2023.    12-14 Month ICM trend:     [name], RN 08/07/2023 3:58 PM

## 2023-08-21 NOTE — Progress Notes (Signed)
Advanced Heart Failure Clinic Note   PCP: Elfredia Nevins, MD Urology: Dr. Isabel Caprice EP: Dr. Johney Frame  HF Cardiology: Dr. Shirlee Latch  HPI: Anita Warren is a 62 y.o. female with history of viral, non-ischemic cardiomyopathy, severe systolic dysfunction with EF of 10% s/p ICD pacemaker in the setting of VT 02/2011 (St. Jude);  cath Sep 10, 2011 with no CAD.   Admitted 7/15 with acute on chronic systolic CHF she was not taking medications for 4 months because she lost her insurance. She was diuresed with IV lasix and transitioned to lasix 40 mg daily. She was also discharged on 12.5 mg carvedilol twice a day, hydralazine 25 mg tid, and 30 mg Imdur daily. Discharge weight was 176 pounds. She was not on an ACEI due to angioedema.  Admitted from HF clinic 06/02/14 with low output heart failure. Swan placed and showed cardiogenic shock. Ultimately discharged on  Milrinone 0.25 mcg/kg/min. Hospital stay was complicated by atrial flutter. Loaded on amiodarone and started on eliquis 5 mg twice a day. Met with Dr Donata Clay and VAD coordinator. She was titrated off the milrinone.  Echo in 8/17 showed EF up to 50%, echo in 8/18 showed EF 45%.  Echo in 11/19 showed EF 35-40%. Echo in 11/21 showed EF stable 40% with normal RV.  Echo in 12/22 showed EF 35-40%, moderate LV dilation, mildly decreased RV systolic function, moderate pericardial effusion.   Echo 6/24, EF 35% with diffuse hypokinesis, mildly decreased RV systolic function, IVC normal.   Today she returns for HF follow up. Overall feeling "wonderful". She is not SOB with activity. Denies palpitations, abnormal bleeding, CP, dizziness, edema, or PND/Orthopnea. Appetite ok. No fever or chills. Weight at home 174 pounds. Taking all medications. Recently married.   Device interrogation (personally reviewed): CorVue stable, < 1% VP, no VT  ECG (personally reviewed): none ordered today.  - Echo (7/15): EF 5-10% RV mod to severely dilated.  - Echo (10/15): EF  20-25% RV read as normal - Echo (8/17):  EF 50%, mild LV dilation, normal RV size and systolic function.  - Echo (8/18): EF 45%, diffuse HK worse inferiorly, normal RV size and systolic function.  - Echo (11/19): EF 35-40%, mild LV dilation with diffuse hypokinesis, normal RV size with mildly decreased systolic function.  - Echo (11/21): EF 40%, hypokinesis of the basal to mid septum and the basal inferior wall, RV normal, small pericardial effusion.  - Echo (12/22): EF 35-40%, moderate LV dilation, mildly decreased RV systolic function, moderate pericardial effusion. - Echo (6/24): EF 35% with diffuse hypokinesis, mildly decreased RV systolic function, IVC normal.   Labs:  HIV negative 2015  9/15: K 3.6 => 4.6, creatinine 0.99 => 1.5 => 1.2, Mg 1.9, HCT 45.2, LFTs normal, TSH normal 4/19: K 3.8, creatinine 1.32, hgb 10.5 6/21: K 4.3, creatinine 1.34, LFTs normal 7/21: hgb 10.4 8/21: K 3.9, creatinine 1.38 11/21: K 3.8, creatinine 1.36 11/22: K 3.7, creatinine 1.56, BNP 29 2/24: K 3.6, creatinine 1.5 6/24: K 4.1, creatinine 1.44  09-09-2012 Colonoscopy Blood Type:  B+  Lower Extremity Doppler - Normal arterial flow Carotid Doppler-  1-39% stenosis involving the right internal carotid artery and the left internal carotid Artery.  SH: Disabled. Has 3 grown children. Does not smoke or drink alcohol.  Re-married (fiance passed away in 09-09-2020). 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    FH: Mom died at 10 CAD         Sister and Brother HTN  Review of systems complete and found to be negative unless listed in HPI.    Past Medical History:  Diagnosis Date   AICD (automatic cardioverter/defibrillator) present    St. Jude AICD implanted 03-07-2011 Dr. Allred/-Dr. Shirlee Latch now follows   Atrial fibrillation (HCC)    Cardiomyopathy (HCC) 2012   CHF (congestive heart failure) (HCC)    meds controlling, no episodes since 2014   Chronic systolic heart failure (HCC)    Essential hypertension, benign     Headache    History of kidney stones    multiple kidney stones in past   History of medication noncompliance    Hydronephrosis with renal and ureteral calculus obstruction 09/05/2013   Nonischemic cardiomyopathy (HCC)    LVEF 5-10%, likely viral (no CAD by cath 01/30/11)   NSVT (nonsustained ventricular tachycardia) (HCC)    Obesity    Current Outpatient Medications  Medication Sig Dispense Refill   acetaminophen (TYLENOL) 500 MG tablet Take 1,000 mg by mouth every 6 (six) hours as needed for headache.      apixaban (ELIQUIS) 5 MG TABS tablet Take 1 tablet (5 mg total) by mouth 2 (two) times daily. 180 tablet 3   carvedilol (COREG) 25 MG tablet TAKE 1 TABLET BY MOUTH TWICE DAILY WITH A MEAL 180 tablet 3   Coenzyme Q10 (CO Q 10) 100 MG CAPS Take 100 mg by mouth in the morning.     dapagliflozin propanediol (FARXIGA) 10 MG TABS tablet Take 1 tablet (10 mg total) by mouth daily before breakfast. 90 tablet 3   diphenhydrAMINE (BENADRYL) 25 MG tablet Take 25 mg by mouth every 6 (six) hours as needed (headache).      Ergocalciferol (VITAMIN D2) 50 MCG (2000 UT) TABS Take 1 tablet by mouth once a week. Takes on Wednesdays     furosemide (LASIX) 20 MG tablet Take 1 tablet by mouth once daily 90 tablet 0   hydrALAZINE (APRESOLINE) 100 MG tablet TAKE 1 TABLET BY MOUTH THREE TIMES DAILY . FOLLOW UP APPOINTMENT REQUIRED FOR FUTURE REFILLS 270 tablet 0   isosorbide mononitrate (IMDUR) 30 MG 24 hr tablet Take 3 tablets by mouth once daily 270 tablet 0   potassium chloride (KLOR-CON) 10 MEQ tablet TAKE 1 TABLET BY MOUTH ONCE DAILY . APPOINTMENT REQUIRED FOR FUTURE REFILLS 60 tablet 0   spironolactone (ALDACTONE) 50 MG tablet Take 1 tablet by mouth once daily 90 tablet 0   traMADol (ULTRAM) 50 MG tablet Take 1 tablet (50 mg total) by mouth every 6 (six) hours as needed. 15 tablet 0   No current facility-administered medications for this encounter.   BP 92/66   Pulse 82   Wt 78.4 kg (172 lb 12.8 oz)    LMP 10/28/1999   SpO2 97%   BMI 29.66 kg/m   Wt Readings from Last 3 Encounters:  08/24/23 78.4 kg (172 lb 12.8 oz)  04/23/23 79.7 kg (175 lb 9.6 oz)  04/10/23 78 kg (172 lb)   PHYSICAL EXAM: General:  NAD. No resp difficulty, walked into clinic HEENT: Normal Neck: Supple. No JVD. Carotids 2+ bilat; no bruits. No lymphadenopathy or thryomegaly appreciated. Cor: PMI nondisplaced. Regular rate & rhythm. No rubs, gallops or murmurs. Lungs: Clear Abdomen: Soft, nontender, nondistended. No hepatosplenomegaly. No bruits or masses. Good bowel sounds. Extremities: No cyanosis, clubbing, rash, edema Neuro: Alert & oriented x 3, cranial nerves grossly intact. Moves all 4 extremities w/o difficulty. Affect pleasant.  ASSESSMENT & PLAN: 1. Chronic Systolic Heart Failure: Nonischemic cardiomyopathy  thought to potentially be due to viral myocarditis noted initially in 2011. Cath 2012 normal coronaries.  St Jude ICD. Echo (7/15) with EF 5-10% and RV mod-severely dilated with moderately decreased systolic function. Repeat echo 10/15 with EF up to 20-25%. She was on milrinone briefly then stopped.  Echo in 8/17 with EF up to 50%. Repeat echo 11/19 showed EF back down to 35-40% with normal RV. Echo in 11/21 showed stable EF 40% and septal + inferior wall motion abnormalities.  Echo in 12/22 showed EF 35-40%, mild RV dysfunction, moderate pericardial effusion.  Echo 6/24 EF 35%, mild RV dysfunction. NYHA class I-II, she is not volume overloaded by exam or device interrogation.  - Continue Lasix 20 mg daily + 10 KCL daily, BMET/BNP today.   - Continue Farxiga 10 mg daily. No GU symptoms - Continue spironolactone 50 mg daily.  - Continue Coreg 25 mg bid.   - Continue hydralazine 100 mg tid. - With soft BP, decrease Imdur to 60 mg daily. She has BP cuff at home, I asked her to check BP and notify clinic if BP > 120.80. - No ACEI/ARB/ARNI due to recurrent episodes of angioedema with ACEI and also ARB.  2.  Atrial flutter: Paroxysmal, regular on exam today.  - She is now off amiodarone.  If atrial flutter recurs, should have ablation.  - Continue Eliquis 5 mg bid. No bleeding issues. Recent CBC reviewed and stable. 3. Angioedema: Had in past on ACEI, had another episode after ACEI stopped.  She is no longer on ACEI, ARNI, or ARB. ? Hereditary or acquired C1 inhibitor deficiency. No further problems with this. 4. CKD: Stage 3b. Baseline SCr 1.3-1.5. - Continue SGLT2i. BMET today.  Follow up in 4 months with Dr. Park Liter, FNP  08/24/2023

## 2023-08-24 ENCOUNTER — Encounter (HOSPITAL_COMMUNITY): Payer: Self-pay

## 2023-08-24 ENCOUNTER — Telehealth (HOSPITAL_COMMUNITY): Payer: Self-pay | Admitting: Cardiology

## 2023-08-24 ENCOUNTER — Ambulatory Visit (HOSPITAL_COMMUNITY)
Admission: RE | Admit: 2023-08-24 | Discharge: 2023-08-24 | Disposition: A | Discharge status: Home / Self Care | Payer: 59 | Source: Ambulatory Visit | Attending: Family Medicine | Admitting: Family Medicine

## 2023-08-24 VITALS — BP 92/66 | HR 82 | Wt 172.8 lb

## 2023-08-24 DIAGNOSIS — T783XXA Angioneurotic edema, initial encounter: Secondary | ICD-10-CM | POA: Diagnosis not present

## 2023-08-24 DIAGNOSIS — X58XXXA Exposure to other specified factors, initial encounter: Secondary | ICD-10-CM | POA: Diagnosis not present

## 2023-08-24 DIAGNOSIS — Z95810 Presence of automatic (implantable) cardiac defibrillator: Secondary | ICD-10-CM | POA: Diagnosis not present

## 2023-08-24 DIAGNOSIS — I5022 Chronic systolic (congestive) heart failure: Secondary | ICD-10-CM

## 2023-08-24 DIAGNOSIS — I428 Other cardiomyopathies: Secondary | ICD-10-CM | POA: Insufficient documentation

## 2023-08-24 DIAGNOSIS — Z79899 Other long term (current) drug therapy: Secondary | ICD-10-CM | POA: Diagnosis not present

## 2023-08-24 DIAGNOSIS — I130 Hypertensive heart and chronic kidney disease with heart failure and stage 1 through stage 4 chronic kidney disease, or unspecified chronic kidney disease: Secondary | ICD-10-CM | POA: Diagnosis not present

## 2023-08-24 DIAGNOSIS — I3139 Other pericardial effusion (noninflammatory): Secondary | ICD-10-CM | POA: Diagnosis not present

## 2023-08-24 DIAGNOSIS — I4892 Unspecified atrial flutter: Secondary | ICD-10-CM | POA: Insufficient documentation

## 2023-08-24 DIAGNOSIS — N1832 Chronic kidney disease, stage 3b: Secondary | ICD-10-CM | POA: Insufficient documentation

## 2023-08-24 DIAGNOSIS — Z7901 Long term (current) use of anticoagulants: Secondary | ICD-10-CM | POA: Insufficient documentation

## 2023-08-24 LAB — BASIC METABOLIC PANEL
Anion gap: 6 (ref 5–15)
BUN: 23 mg/dL (ref 8–23)
CO2: 22 mmol/L (ref 22–32)
Calcium: 9 mg/dL (ref 8.9–10.3)
Chloride: 106 mmol/L (ref 98–111)
Creatinine, Ser: 1.67 mg/dL — ABNORMAL HIGH (ref 0.44–1.00)
GFR, Estimated: 34 mL/min — ABNORMAL LOW (ref >60)
Glucose, Bld: 116 mg/dL — ABNORMAL HIGH (ref 70–99)
Potassium: 4 mmol/L (ref 3.5–5.1)
Sodium: 134 mmol/L — ABNORMAL LOW (ref 135–145)

## 2023-08-24 LAB — BRAIN NATRIURETIC PEPTIDE: B Natriuretic Peptide: 20.1 pg/mL (ref 0.0–100.0)

## 2023-08-24 MED ORDER — FUROSEMIDE 20 MG PO TABS: 10.0000 mg | ORAL_TABLET | Freq: Every day | ORAL | 3 refills | Status: DC

## 2023-08-24 MED ORDER — ISOSORBIDE MONONITRATE ER 30 MG PO TB24: 60.0000 mg | ORAL_TABLET | Freq: Every day | ORAL | 6 refills | Status: DC

## 2023-08-24 NOTE — Telephone Encounter (Signed)
Patient called.  Patient aware.  Repeat labs at [facility]

## 2023-08-24 NOTE — Telephone Encounter (Signed)
-----   Message from Jacklynn Ganong sent at 08/24/2023 12:16 PM EDT ----- Renal function up a bit.  Decrease Lasix to 10 mg daily. Keep KCL at 10 daily.  Repeat BMET in 10 days

## 2023-08-24 NOTE — Patient Instructions (Addendum)
Thank you for coming in today  If you had labs drawn today, any labs that are abnormal the clinic will call you No news is good news  Medications: Decrease Imdur to 60 mg daily   Follow up appointments:  Your physician recommends that you schedule a follow-up appointment in:  4 months With Dr. Earlean Shawl will receive a reminder letter in the mail a few months in advance. If you don't receive a letter, please call our office to schedule the follow-up appointment. Please call in December 2024 to make February/March 2025 appointment    Do the following things EVERYDAY: Weigh yourself in the morning before breakfast. Write it down and keep it in a log. Take your medicines as prescribed Eat low salt foods--Limit salt (sodium) to 2000 mg per day.  Stay as active as you can everyday Limit all fluids for the day to less than 2 liters   At the [facility], you and your health needs are our priority. As part of our continuing mission to provide you with exceptional heart care, we have created designated Provider Care Teams. These Care Teams include your primary Cardiologist (physician) and Advanced Practice Providers (APPs- Physician Assistants and Nurse Practitioners) who all work together to provide you with the care you need, when you need it.   You may see any of the following providers on your designated Care Team at your next follow up: Dr Arvilla Meres Dr Marca Ancona Dr. Marcos Eke, NP Robbie Lis, [place] [facility] [place], [place] Brynda Peon, NP Karle Plumber, PharmD   Please be sure to bring in all your medications bottles to every appointment.    Thank you for choosing [facility]  If you have any questions or concerns before your next appointment please send [place] a message through [place] or call our office at [phone].    TO LEAVE A MESSAGE FOR THE NURSE SELECT OPTION 2, PLEASE  LEAVE A MESSAGE INCLUDING: YOUR NAME DATE OF BIRTH CALL BACK NUMBER REASON FOR CALL**this is important as we prioritize the call backs  YOU WILL RECEIVE A CALL BACK THE SAME DAY AS LONG AS YOU CALL BEFORE 4:00 PM

## 2023-09-02 DIAGNOSIS — I5022 Chronic systolic (congestive) heart failure: Secondary | ICD-10-CM | POA: Diagnosis not present

## 2023-09-03 LAB — BASIC METABOLIC PANEL
BUN/Creatinine Ratio: 16 (ref 12–28)
BUN: 22 mg/dL (ref 8–27)
CO2: 20 mmol/L (ref 20–29)
Calcium: 9 mg/dL (ref 8.7–10.3)
Chloride: 105 mmol/L (ref 96–106)
Creatinine, Ser: 1.34 mg/dL — ABNORMAL HIGH (ref 0.57–1.00)
Glucose: 103 mg/dL — ABNORMAL HIGH (ref 70–99)
Potassium: 3.9 mmol/L (ref 3.5–5.2)
Sodium: 137 mmol/L (ref 134–144)
eGFR: 45 mL/min/{1.73_m2} — ABNORMAL LOW (ref >59)

## 2023-09-04 ENCOUNTER — Encounter: Payer: Self-pay | Admitting: Cardiology

## 2023-09-07 ENCOUNTER — Ambulatory Visit: Payer: 59 | Attending: Cardiovascular Disease

## 2023-09-07 DIAGNOSIS — I5022 Chronic systolic (congestive) heart failure: Secondary | ICD-10-CM | POA: Diagnosis not present

## 2023-09-07 DIAGNOSIS — Z95810 Presence of automatic (implantable) cardiac defibrillator: Secondary | ICD-10-CM | POA: Diagnosis not present

## 2023-09-08 NOTE — Progress Notes (Signed)
EPIC Encounter for ICM Monitoring  Patient Name: Anita Warren is a 62 y.o. female Date: 09/08/2023 Primary Care Physican: Elfredia Nevins, MD Primary Cardiologist: Shirlee Latch Electrophysiologist: Mealor 01/06/2023 Weight: 175 lbs 03/17/2023 Weight: 174-176 lbs 05/27/2023 Weight: 174 lbs 08/07/2023 Weight: 174 lbs 09/08/2023 Weight: 174 lbs                                                            Spoke with patient and heart failure questions reviewed.  Transmission results reviewed.  Pt asymptomatic for fluid accumulation.     Coruve thoracic impedance suggesting possible fluid accumulation starting 10/29 (Lasix decreased to 10 mg daily on 10/28).   Prescribed:  Furosemide 20 mg take 0.5 tablet (10 mg total) daily (decreased 10/28).   Potassium 10 mEq take 1 tablet daily. Spironolactone 50 mg take 1 tablet daily     Labs: 09/02/2023 Creatinine 1.34, BUN 22, Potassium 3.9, Sodium 137, GFR 45 08/24/2023 Creatinine 1.67, BUN 23, Potassium 4.0, Sodium 134 04/23/2023 Creatinine 1.44, BUN 24, Potassium 4.1, Sodium 137, GFR 41 12/04/2022 Creatinine 1.50, BUN 25, Potassium 3.6, Sodium 138, GFR 39 A complete set of results can be found in Results Review.   Recommendations:  Discussed monitoring and limiting fluid intake to 64 oz daily and she thinks she is drinking less than that.  Confirmed she is taking Lasix 10 mg daily.   Copy sent to Prince Rome, NP for review and recommendations if needed.    Follow-up plan: ICM clinic phone appointment on 09/14/2023 to recheck fluid levels.   91 day device clinic remote transmission 12/09/2023.      EP/Cardiology Office Visits: 04/01/2024 with Dr Nelly Laurence.  Recall 12/22/2023 with Dr Shirlee Latch.   Copy of ICM check sent to Dr. Nelly Laurence.   3 month ICM trend: 09/07/2023.    12-14 Month ICM trend:     [name], RN 09/08/2023 3:07 PM

## 2023-09-09 ENCOUNTER — Ambulatory Visit (INDEPENDENT_AMBULATORY_CARE_PROVIDER_SITE_OTHER): Payer: 59

## 2023-09-09 DIAGNOSIS — I428 Other cardiomyopathies: Secondary | ICD-10-CM

## 2023-09-09 LAB — CUP PACEART REMOTE DEVICE CHECK
Battery Remaining Longevity: 110 mo
Battery Remaining Percentage: 89 %
Battery Voltage: 3.02 V
Brady Statistic RV Percent Paced: 1 %
Date Time Interrogation Session: 20241111072822
HighPow Impedance: 35 Ohm
Implantable Lead Connection Status: 753985
Implantable Lead Implant Date: 20120511
Implantable Lead Location: 753860
Implantable Lead Model: 7120
Implantable Pulse Generator Implant Date: 20240209
Lead Channel Impedance Value: 460 Ohm
Lead Channel Pacing Threshold Amplitude: 0.5 V
Lead Channel Pacing Threshold Pulse Width: 0.5 ms
Lead Channel Sensing Intrinsic Amplitude: 10.8 mV
Lead Channel Setting Pacing Amplitude: 2.5 V
Lead Channel Setting Pacing Pulse Width: 0.5 ms
Lead Channel Setting Sensing Sensitivity: 0.5 mV
Pulse Gen Serial Number: 211012829
Zone Setting Status: 755011

## 2023-09-09 MED ORDER — FUROSEMIDE 20 MG PO TABS: 20.0000 mg | ORAL_TABLET | Freq: Every day | ORAL | 3 refills | Status: DC

## 2023-09-09 NOTE — Progress Notes (Signed)
Spoke with patient and advised Prince Rome, NP at HF clinic recommended to take Lasix 20 mg daily and continue the current Potassium dosage at 10 mEq daily.   Will need labs drawn in 10-14 days.  She will have it drawn at [facility] in [place] between 11/25-11/27.   Epic Lasix dosage updated and prescription sent to preferred pharmacy, [facility] in [place].

## 2023-09-09 NOTE — Addendum Note (Signed)
Addended by: [name] on: 09/09/2023 11:50 AM   Modules accepted: Orders

## 2023-09-09 NOTE — Progress Notes (Signed)
  Received: Today [place], Anderson Malta, FNP  Sharren Schnurr, Josephine Igo, RN Thanks Jacki Cones.  Increase Lasix back to 20 mg daily, keep KCL at 10 daily. Please repeat BMET in 10-14 days.

## 2023-09-11 ENCOUNTER — Other Ambulatory Visit (HOSPITAL_COMMUNITY): Payer: Self-pay | Admitting: Cardiology

## 2023-09-12 ENCOUNTER — Other Ambulatory Visit (HOSPITAL_COMMUNITY): Payer: Self-pay | Admitting: Cardiology

## 2023-09-14 ENCOUNTER — Ambulatory Visit: Payer: 59 | Attending: Cardiovascular Disease

## 2023-09-14 DIAGNOSIS — Z95810 Presence of automatic (implantable) cardiac defibrillator: Secondary | ICD-10-CM

## 2023-09-14 DIAGNOSIS — I5022 Chronic systolic (congestive) heart failure: Secondary | ICD-10-CM

## 2023-09-15 NOTE — Progress Notes (Signed)
EPIC Encounter for ICM Monitoring  Patient Name: Anita Warren is a 62 y.o. female Date: 09/15/2023 Primary Care Physican: Elfredia Nevins, MD Primary Cardiologist: Shirlee Latch Electrophysiologist: Mealor 01/06/2023 Weight: 175 lbs 03/17/2023 Weight: 174-176 lbs 05/27/2023 Weight: 174 lbs 08/07/2023 Weight: 174 lbs 09/08/2023 Weight: 174 lbs                                                            Spoke with patient and heart failure questions reviewed.  Transmission results reviewed.  Pt asymptomatic for fluid accumulation.  She reports she got the shingle shot and was not drinking enough fluids.  She increased fluids today and tomorrow.   Diet:  Pt not consistent with fluid intake  Coruve thoracic impedance suggesting fluid levels returned to normal after Lasix changed to 20 mg daily but then possible dryness starting 11/13.   Prescribed:  Furosemide 20 mg take 1 tablet (20 mg total) daily  Potassium 10 mEq take 1 tablet daily. Spironolactone 50 mg take 1 tablet daily     Labs: 09/02/2023 Creatinine 1.34, BUN 22, Potassium 3.9, Sodium 137, GFR 45 08/24/2023 Creatinine 1.67, BUN 23, Potassium 4.0, Sodium 134 04/23/2023 Creatinine 1.44, BUN 24, Potassium 4.1, Sodium 137, GFR 41 12/04/2022 Creatinine 1.50, BUN 25, Potassium 3.6, Sodium 138, GFR 39 A complete set of results can be found in Results Review.   Recommendations:  Advised to continue to drink 64 oz fluid to stay consistent.  No changes and encouraged to call if experiencing any fluid symptoms.    Follow-up plan: ICM clinic phone appointment on 09/21/2023 to recheck fluid levels.   91 day device clinic remote transmission 12/09/2023.      EP/Cardiology Office Visits: 04/01/2024 with Dr Nelly Laurence.  Recall 12/22/2023 with Dr Shirlee Latch.   Copy of ICM check sent to Dr. Nelly Laurence.   3 month ICM trend: 09/14/2023.    12-14 Month ICM trend:     [name], RN 09/15/2023 6:38 AM

## 2023-09-17 ENCOUNTER — Other Ambulatory Visit (HOSPITAL_COMMUNITY): Payer: Self-pay | Admitting: Internal Medicine

## 2023-09-17 DIAGNOSIS — Z1231 Encounter for screening mammogram for malignant neoplasm of breast: Secondary | ICD-10-CM

## 2023-09-21 ENCOUNTER — Ambulatory Visit: Payer: 59 | Attending: Cardiovascular Disease

## 2023-09-21 DIAGNOSIS — Z95810 Presence of automatic (implantable) cardiac defibrillator: Secondary | ICD-10-CM

## 2023-09-21 DIAGNOSIS — I5022 Chronic systolic (congestive) heart failure: Secondary | ICD-10-CM

## 2023-09-21 NOTE — Progress Notes (Signed)
EPIC Encounter for ICM Monitoring  Patient Name: Anita Warren is a 62 y.o. female Date: 09/21/2023 Primary Care Physican: Elfredia Nevins, MD Primary Cardiologist: Shirlee Latch Electrophysiologist: Mealor 01/06/2023 Weight: 175 lbs 03/17/2023 Weight: 174-176 lbs 05/27/2023 Weight: 174 lbs 08/07/2023 Weight: 174 lbs 09/08/2023 Weight: 174 lbs                                                            Spoke with patient and heart failure questions reviewed.  Transmission results reviewed.  Pt asymptomatic for fluid accumulation.  She is feeling well.    Diet:  Pt not consistent with fluid intake   Coruve thoracic impedance suggesting fluid levels returned to normal.   Prescribed:  Furosemide 20 mg take 1 tablet (20 mg total) daily  Potassium 10 mEq take 1 tablet daily. Spironolactone 50 mg take 1 tablet daily     Labs: 09/22/2023 BMET Scheduled at [facility] 09/02/2023 Creatinine 1.34, BUN 22, Potassium 3.9, Sodium 137, GFR 45 08/24/2023 Creatinine 1.67, BUN 23, Potassium 4.0, Sodium 134 04/23/2023 Creatinine 1.44, BUN 24, Potassium 4.1, Sodium 137, GFR 41 12/04/2022 Creatinine 1.50, BUN 25, Potassium 3.6, Sodium 138, GFR 39 A complete set of results can be found in Results Review.   Recommendations:   No changes and encouraged to call if experiencing any fluid symptoms.    Follow-up plan: ICM clinic phone appointment on 10/19/2023.   91 day device clinic remote transmission 12/09/2023.      EP/Cardiology Office Visits: 04/01/2024 with Dr Nelly Laurence.  Recall 12/22/2023 with Dr Shirlee Latch.   Copy of ICM check sent to Dr. Nelly Laurence.   3 month ICM trend: 09/21/2023.    12-14 Month ICM trend:     [name], RN 09/21/2023 11:02 AM

## 2023-09-22 ENCOUNTER — Other Ambulatory Visit (HOSPITAL_COMMUNITY): Payer: Self-pay | Admitting: Cardiology

## 2023-09-22 DIAGNOSIS — I5022 Chronic systolic (congestive) heart failure: Secondary | ICD-10-CM | POA: Diagnosis not present

## 2023-09-23 LAB — BASIC METABOLIC PANEL
BUN/Creatinine Ratio: 14 (ref 12–28)
BUN: 24 mg/dL (ref 8–27)
CO2: 23 mmol/L (ref 20–29)
Calcium: 9.3 mg/dL (ref 8.7–10.3)
Chloride: 101 mmol/L (ref 96–106)
Creatinine, Ser: 1.66 mg/dL — ABNORMAL HIGH (ref 0.57–1.00)
Glucose: 117 mg/dL — ABNORMAL HIGH (ref 70–99)
Potassium: 4 mmol/L (ref 3.5–5.2)
Sodium: 135 mmol/L (ref 134–144)
eGFR: 35 mL/min/{1.73_m2} — ABNORMAL LOW (ref >59)

## 2023-09-25 ENCOUNTER — Other Ambulatory Visit (HOSPITAL_COMMUNITY): Payer: Self-pay | Admitting: Cardiology

## 2023-09-29 NOTE — Progress Notes (Signed)
Remote ICD transmission.   

## 2023-10-05 ENCOUNTER — Telehealth: Payer: Self-pay

## 2023-10-05 NOTE — Telephone Encounter (Signed)
Returned patient call as requested by voice mail message.  Pt asked if there is a current Isosorbide script on file.  Advised prescription was sent to [facility] in [place] on 10/28 for 60 tablets and 6 refills.  She appreciated the information.

## 2023-10-19 ENCOUNTER — Ambulatory Visit: Payer: 59 | Attending: Cardiovascular Disease

## 2023-10-19 DIAGNOSIS — I5022 Chronic systolic (congestive) heart failure: Secondary | ICD-10-CM

## 2023-10-19 DIAGNOSIS — Z95810 Presence of automatic (implantable) cardiac defibrillator: Secondary | ICD-10-CM

## 2023-10-23 NOTE — Progress Notes (Signed)
EPIC Encounter for ICM Monitoring  Patient Name: Anita Warren is a 62 y.o. female Date: 10/23/2023 Primary Care Physican: Elfredia Nevins, MD Primary Cardiologist: Shirlee Latch Electrophysiologist: Mealor 01/06/2023 Weight: 175 lbs 03/17/2023 Weight: 174-176 lbs 05/27/2023 Weight: 174 lbs 08/07/2023 Weight: 174 lbs 09/08/2023 Weight: 174 lbs                                                            Spoke with patient and heart failure questions reviewed.  Transmission results reviewed.  Pt asymptomatic for fluid accumulation.  She currently has a cold.    Diet:  Pt not consistent with fluid intake   Coruve thoracic impedance suggesting intermittent days with possible fluid accumulation within the last month.   Prescribed:  Furosemide 20 mg take 1 tablet (20 mg total) daily  Potassium 10 mEq take 1 tablet daily. Spironolactone 50 mg take 1 tablet daily     Labs: 09/22/2023 Creatinine 1.66, BUN 24, Potassium 4.0, Sodium 135, GFR 35 09/02/2023 Creatinine 1.34, BUN 22, Potassium 3.9, Sodium 137, GFR 45 08/24/2023 Creatinine 1.67, BUN 23, Potassium 4.0, Sodium 134 04/23/2023 Creatinine 1.44, BUN 24, Potassium 4.1, Sodium 137, GFR 41 12/04/2022 Creatinine 1.50, BUN 25, Potassium 3.6, Sodium 138, GFR 39 A complete set of results can be found in Results Review.   Recommendations:   No changes and encouraged to call if experiencing any fluid symptoms.    Follow-up plan: ICM clinic phone appointment on 11/23/2023.   91 day device clinic remote transmission 12/09/2023.      EP/Cardiology Office Visits: 04/01/2024 with Dr Nelly Laurence.  Recall 12/22/2023 with Dr Shirlee Latch.   Copy of ICM check sent to Dr. Nelly Laurence.   3 month ICM trend: 10/19/2023.    12-14 Month ICM trend:     [name], RN 10/23/2023 1:30 PM

## 2023-11-16 ENCOUNTER — Ambulatory Visit (HOSPITAL_COMMUNITY): Payer: 59

## 2023-11-23 ENCOUNTER — Ambulatory Visit: Payer: 59 | Attending: Cardiovascular Disease

## 2023-11-23 DIAGNOSIS — I5022 Chronic systolic (congestive) heart failure: Secondary | ICD-10-CM

## 2023-11-23 DIAGNOSIS — Z95810 Presence of automatic (implantable) cardiac defibrillator: Secondary | ICD-10-CM | POA: Diagnosis not present

## 2023-11-27 NOTE — Progress Notes (Signed)
EPIC Encounter for ICM Monitoring  Patient Name: Anita Warren is a 63 y.o. female Date: 11/27/2023 Primary Care Physican: Elfredia Nevins, MD Primary Cardiologist: Shirlee Latch Electrophysiologist: Mealor 01/06/2023 Weight: 175 lbs 03/17/2023 Weight: 174-176 lbs 05/27/2023 Weight: 174 lbs 08/07/2023 Weight: 174 lbs 09/08/2023 Weight: 174 lbs 11/27/2023 Weight: 174 lbs                                                            Spoke with patient and heart failure questions reviewed.  Transmission results reviewed.  Pt asymptomatic for fluid accumulation.     Diet:  Pt not consistent with fluid intake   Coruve thoracic impedance suggesting normal fluid levels since 1/3.   Prescribed:  Furosemide 20 mg take 1 tablet (20 mg total) daily  Potassium 10 mEq take 1 tablet daily. Spironolactone 50 mg take 1 tablet daily     Labs: 09/22/2023 Creatinine 1.66, BUN 24, Potassium 4.0, Sodium 135, GFR 35 09/02/2023 Creatinine 1.34, BUN 22, Potassium 3.9, Sodium 137, GFR 45 08/24/2023 Creatinine 1.67, BUN 23, Potassium 4.0, Sodium 134 04/23/2023 Creatinine 1.44, BUN 24, Potassium 4.1, Sodium 137, GFR 41 12/04/2022 Creatinine 1.50, BUN 25, Potassium 3.6, Sodium 138, GFR 39 A complete set of results can be found in Results Review.   Recommendations:   No changes and encouraged to call if experiencing any fluid symptoms.    Follow-up plan: ICM clinic phone appointment on 12/28/2023.   91 day device clinic remote transmission 12/09/2023.      EP/Cardiology Office Visits: 04/01/2024 with Dr Nelly Laurence.  Recall 12/22/2023 with Dr Shirlee Latch.   Copy of ICM check sent to Dr. Nelly Laurence.   3 month ICM trend: 11/23/2023.    12-14 Month ICM trend:     [name], RN 11/27/2023 2:33 PM

## 2023-12-02 ENCOUNTER — Ambulatory Visit (HOSPITAL_COMMUNITY)
Admission: RE | Admit: 2023-12-02 | Discharge: 2023-12-02 | Disposition: A | Discharge status: Home / Self Care | Payer: 59 | Source: Ambulatory Visit | Attending: Internal Medicine | Admitting: Internal Medicine

## 2023-12-02 DIAGNOSIS — Z1231 Encounter for screening mammogram for malignant neoplasm of breast: Secondary | ICD-10-CM | POA: Insufficient documentation

## 2023-12-05 ENCOUNTER — Other Ambulatory Visit (HOSPITAL_COMMUNITY): Payer: Self-pay | Admitting: Cardiology

## 2023-12-08 ENCOUNTER — Other Ambulatory Visit (HOSPITAL_COMMUNITY): Payer: Self-pay | Admitting: Cardiology

## 2023-12-08 ENCOUNTER — Other Ambulatory Visit (HOSPITAL_COMMUNITY): Payer: Self-pay | Admitting: Internal Medicine

## 2023-12-08 DIAGNOSIS — R928 Other abnormal and inconclusive findings on diagnostic imaging of breast: Secondary | ICD-10-CM

## 2023-12-09 ENCOUNTER — Ambulatory Visit (INDEPENDENT_AMBULATORY_CARE_PROVIDER_SITE_OTHER): Payer: 59

## 2023-12-09 DIAGNOSIS — I428 Other cardiomyopathies: Secondary | ICD-10-CM

## 2023-12-10 ENCOUNTER — Ambulatory Visit (HOSPITAL_COMMUNITY)
Admission: RE | Admit: 2023-12-10 | Discharge: 2023-12-10 | Disposition: A | Discharge status: Home / Self Care | Payer: 59 | Source: Ambulatory Visit | Attending: Internal Medicine | Admitting: Internal Medicine

## 2023-12-10 ENCOUNTER — Other Ambulatory Visit (HOSPITAL_COMMUNITY): Payer: Self-pay | Admitting: Internal Medicine

## 2023-12-10 ENCOUNTER — Encounter (HOSPITAL_COMMUNITY): Payer: Self-pay

## 2023-12-10 DIAGNOSIS — R928 Other abnormal and inconclusive findings on diagnostic imaging of breast: Secondary | ICD-10-CM

## 2023-12-10 DIAGNOSIS — R92322 Mammographic fibroglandular density, left breast: Secondary | ICD-10-CM | POA: Diagnosis not present

## 2023-12-10 DIAGNOSIS — N6325 Unspecified lump in the left breast, overlapping quadrants: Secondary | ICD-10-CM | POA: Diagnosis not present

## 2023-12-11 LAB — CUP PACEART REMOTE DEVICE CHECK
Battery Remaining Longevity: 108 mo
Battery Remaining Percentage: 88 %
Battery Voltage: 3.02 V
Brady Statistic RV Percent Paced: 1 %
Date Time Interrogation Session: 20250211210308
HighPow Impedance: 41 Ohm
Implantable Lead Connection Status: 753985
Implantable Lead Implant Date: 20120511
Implantable Lead Location: 753860
Implantable Lead Model: 7120
Implantable Pulse Generator Implant Date: 20240209
Lead Channel Impedance Value: 460 Ohm
Lead Channel Pacing Threshold Amplitude: 0.5 V
Lead Channel Pacing Threshold Pulse Width: 0.5 ms
Lead Channel Sensing Intrinsic Amplitude: 11.4 mV
Lead Channel Setting Pacing Amplitude: 2.5 V
Lead Channel Setting Pacing Pulse Width: 0.5 ms
Lead Channel Setting Sensing Sensitivity: 0.5 mV
Pulse Gen Serial Number: 211012829
Zone Setting Status: 755011

## 2023-12-16 ENCOUNTER — Other Ambulatory Visit (HOSPITAL_COMMUNITY): Payer: Self-pay | Admitting: Cardiology

## 2023-12-22 ENCOUNTER — Ambulatory Visit (HOSPITAL_COMMUNITY)
Admission: RE | Admit: 2023-12-22 | Discharge: 2023-12-22 | Disposition: A | Discharge status: Home / Self Care | Payer: 59 | Source: Ambulatory Visit | Attending: Internal Medicine | Admitting: Internal Medicine

## 2023-12-22 ENCOUNTER — Encounter (HOSPITAL_COMMUNITY): Payer: Self-pay

## 2023-12-22 DIAGNOSIS — R928 Other abnormal and inconclusive findings on diagnostic imaging of breast: Secondary | ICD-10-CM | POA: Insufficient documentation

## 2023-12-22 DIAGNOSIS — D242 Benign neoplasm of left breast: Secondary | ICD-10-CM | POA: Diagnosis not present

## 2023-12-22 DIAGNOSIS — N6325 Unspecified lump in the left breast, overlapping quadrants: Secondary | ICD-10-CM | POA: Diagnosis not present

## 2023-12-22 HISTORY — PX: BREAST BIOPSY: SHX20

## 2023-12-22 MED ORDER — LIDOCAINE-EPINEPHRINE (PF) 1 %-1:200000 IJ SOLN
10.0000 mL | Freq: Once | INTRAMUSCULAR | Status: AC
  Administered 2023-12-22: 10 mL via INTRADERMAL

## 2023-12-22 MED ORDER — LIDOCAINE HCL (PF) 2 % IJ SOLN
INTRAMUSCULAR | Status: AC
  Filled 2023-12-22: qty 10

## 2023-12-22 MED ORDER — LIDOCAINE HCL (PF) 2 % IJ SOLN
10.0000 mL | Freq: Once | INTRAMUSCULAR | Status: AC
  Administered 2023-12-22: 10 mL

## 2023-12-22 NOTE — Progress Notes (Signed)
 PT tolerated left breast biopsy well today with NAD noted. PT verbalized understanding of discharge instructions. PT ambulated back to the mammogram area this time and given ice packs to use at home. Colette from ultrasound took specimens to lab at this time for processing.

## 2023-12-23 LAB — SURGICAL PATHOLOGY

## 2023-12-24 ENCOUNTER — Encounter: Payer: Self-pay | Admitting: Cardiovascular Disease

## 2023-12-28 ENCOUNTER — Ambulatory Visit: Payer: 59 | Attending: Cardiovascular Disease

## 2023-12-28 DIAGNOSIS — I5022 Chronic systolic (congestive) heart failure: Secondary | ICD-10-CM | POA: Diagnosis not present

## 2023-12-28 DIAGNOSIS — Z95810 Presence of automatic (implantable) cardiac defibrillator: Secondary | ICD-10-CM | POA: Diagnosis not present

## 2024-01-01 NOTE — Progress Notes (Signed)
 EPIC Encounter for ICM Monitoring  Patient Name: Anita Warren is a 63 y.o. female Date: 01/01/2024 Primary Care Physican: Elfredia Nevins, MD Primary Cardiologist: Shirlee Latch Electrophysiologist: Mealor 01/06/2023 Weight: 175 lbs 03/17/2023 Weight: 174-176 lbs 05/27/2023 Weight: 174 lbs 08/07/2023 Weight: 174 lbs 09/08/2023 Weight: 174 lbs 11/27/2023 Weight: 174 lbs 01/01/2024 Weight: 174 lbs                                                            Spoke with patient and heart failure questions reviewed.  Transmission results reviewed.  Pt asymptomatic for fluid accumulation.  Reports feeling well at this time and voices no complaints.     Diet:  Pt not consistent with fluid intake   Coruve thoracic impedance suggesting normal fluid levels with the exception of possible fluid accumulation from 2/4-2/12.   Prescribed:  Furosemide 20 mg take 1 tablet (20 mg total) daily  Potassium 10 mEq take 1 tablet daily. Spironolactone 50 mg take 1 tablet daily     Labs: 09/22/2023 Creatinine 1.66, BUN 24, Potassium 4.0, Sodium 135, GFR 35 09/02/2023 Creatinine 1.34, BUN 22, Potassium 3.9, Sodium 137, GFR 45 08/24/2023 Creatinine 1.67, BUN 23, Potassium 4.0, Sodium 134 04/23/2023 Creatinine 1.44, BUN 24, Potassium 4.1, Sodium 137, GFR 41 12/04/2022 Creatinine 1.50, BUN 25, Potassium 3.6, Sodium 138, GFR 39 A complete set of results can be found in Results Review.   Recommendations:   No changes and encouraged to call if experiencing any fluid symptoms.   Follow-up plan: ICM clinic phone appointment on 02/01/2024.   91 day device clinic remote transmission 03/09/2024.      EP/Cardiology Office Visits: 04/01/2024 with Dr Nelly Laurence.  Reminder to call Dr Kathlyn [place] office for appt.   Recall 12/22/2023 with Dr Shirlee Latch.   Copy of ICM check sent to Dr. Nelly Laurence.   3 month ICM trend: 12/27/2023.    12-14 Month ICM trend:     [name], RN 01/01/2024 7:43 AM

## 2024-01-02 ENCOUNTER — Other Ambulatory Visit (HOSPITAL_COMMUNITY): Payer: Self-pay | Admitting: Cardiology

## 2024-01-03 ENCOUNTER — Other Ambulatory Visit (HOSPITAL_COMMUNITY): Payer: Self-pay | Admitting: Cardiology

## 2024-01-05 ENCOUNTER — Telehealth (HOSPITAL_COMMUNITY): Payer: Self-pay

## 2024-01-05 NOTE — Telephone Encounter (Signed)
 Called to confirm/remind patient of their appointment at the [facility] on 01/06/24.   Patient reminded to bring all medications and/or complete list.  Confirmed patient has transportation. Gave directions, instructed to utilize valet parking.  Confirmed appointment prior to ending call.

## 2024-01-06 ENCOUNTER — Ambulatory Visit (HOSPITAL_COMMUNITY)
Admission: RE | Admit: 2024-01-06 | Discharge: 2024-01-06 | Disposition: A | Discharge status: Home / Self Care | Source: Ambulatory Visit | Attending: Family Medicine | Admitting: Family Medicine

## 2024-01-06 ENCOUNTER — Encounter (HOSPITAL_COMMUNITY): Payer: Self-pay

## 2024-01-06 VITALS — BP 112/72 | HR 94 | Wt 177.0 lb

## 2024-01-06 DIAGNOSIS — I428 Other cardiomyopathies: Secondary | ICD-10-CM | POA: Diagnosis not present

## 2024-01-06 DIAGNOSIS — N63 Unspecified lump in unspecified breast: Secondary | ICD-10-CM | POA: Diagnosis not present

## 2024-01-06 DIAGNOSIS — Z4502 Encounter for adjustment and management of automatic implantable cardiac defibrillator: Secondary | ICD-10-CM | POA: Insufficient documentation

## 2024-01-06 DIAGNOSIS — Z79899 Other long term (current) drug therapy: Secondary | ICD-10-CM | POA: Insufficient documentation

## 2024-01-06 DIAGNOSIS — I4892 Unspecified atrial flutter: Secondary | ICD-10-CM | POA: Insufficient documentation

## 2024-01-06 DIAGNOSIS — Z01818 Encounter for other preprocedural examination: Secondary | ICD-10-CM

## 2024-01-06 DIAGNOSIS — I130 Hypertensive heart and chronic kidney disease with heart failure and stage 1 through stage 4 chronic kidney disease, or unspecified chronic kidney disease: Secondary | ICD-10-CM | POA: Diagnosis not present

## 2024-01-06 DIAGNOSIS — Z7901 Long term (current) use of anticoagulants: Secondary | ICD-10-CM | POA: Insufficient documentation

## 2024-01-06 DIAGNOSIS — N1832 Chronic kidney disease, stage 3b: Secondary | ICD-10-CM | POA: Insufficient documentation

## 2024-01-06 DIAGNOSIS — I5022 Chronic systolic (congestive) heart failure: Secondary | ICD-10-CM | POA: Insufficient documentation

## 2024-01-06 DIAGNOSIS — Z8679 Personal history of other diseases of the circulatory system: Secondary | ICD-10-CM | POA: Diagnosis not present

## 2024-01-06 LAB — CBC
HCT: 33.3 % — ABNORMAL LOW (ref 36.0–46.0)
Hemoglobin: 10.6 g/dL — ABNORMAL LOW (ref 12.0–15.0)
MCH: 28.9 pg (ref 26.0–34.0)
MCHC: 31.8 g/dL (ref 30.0–36.0)
MCV: 90.7 fL (ref 80.0–100.0)
Platelets: 183 10*3/uL (ref 150–400)
RBC: 3.67 MIL/uL — ABNORMAL LOW (ref 3.87–5.11)
RDW: 15.8 % — ABNORMAL HIGH (ref 11.5–15.5)
WBC: 7 10*3/uL (ref 4.0–10.5)
nRBC: 0 % (ref 0.0–0.2)

## 2024-01-06 LAB — BASIC METABOLIC PANEL
Anion gap: 7 (ref 5–15)
BUN: 22 mg/dL (ref 8–23)
CO2: 23 mmol/L (ref 22–32)
Calcium: 9.4 mg/dL (ref 8.9–10.3)
Chloride: 107 mmol/L (ref 98–111)
Creatinine, Ser: 1.74 mg/dL — ABNORMAL HIGH (ref 0.44–1.00)
GFR, Estimated: 33 mL/min — ABNORMAL LOW (ref >60)
Glucose, Bld: 117 mg/dL — ABNORMAL HIGH (ref 70–99)
Potassium: 4.4 mmol/L (ref 3.5–5.1)
Sodium: 137 mmol/L (ref 135–145)

## 2024-01-06 MED ORDER — HYDRALAZINE HCL 100 MG PO TABS: 100.0000 mg | ORAL_TABLET | Freq: Three times a day (TID) | ORAL | 5 refills | Status: DC

## 2024-01-06 MED ORDER — APIXABAN 5 MG PO TABS: 5.0000 mg | ORAL_TABLET | Freq: Two times a day (BID) | ORAL | 5 refills | Status: DC

## 2024-01-06 NOTE — Patient Instructions (Signed)
 Medication Changes:  MEDICATIONS REFILLED   Lab Work:  Labs done today, your results will be available in MyChart, we will contact you for abnormal readings.  Follow-Up in: 3 MONTHS AS SCHEDULED WITH AN ECHO  At the [facility], you and your health needs are our priority. We have a designated team specialized in the treatment of Heart Failure. This Care Team includes your primary Heart Failure Specialized Cardiologist (physician), Advanced Practice Providers (APPs- Physician Assistants and Nurse Practitioners), and Pharmacist who all work together to provide you with the care you need, when you need it.   You may see any of the following providers on your designated Care Team at your next follow up:  Dr. Arvilla Meres Dr. Marca Ancona Dr. Dorthula Nettles Dr. Theresia Bough Tonye Becket, NP Robbie Lis, [place] [facility] [place], [place] Brynda Peon, NP [place] Lee, NP Karle Plumber, PharmD   Please be sure to bring in all your medications bottles to every appointment.   Need to Contact us:  If you have any questions or concerns before your next appointment please send [place] a message through Hope or call our office at [phone].    TO LEAVE A MESSAGE FOR THE NURSE SELECT OPTION 2, PLEASE LEAVE A MESSAGE INCLUDING: YOUR NAME DATE OF BIRTH CALL BACK NUMBER REASON FOR CALL**this is important as we prioritize the call backs  YOU WILL RECEIVE A CALL BACK THE SAME DAY AS LONG AS YOU CALL BEFORE 4:00 PM

## 2024-01-06 NOTE — Progress Notes (Signed)
 [facility] Note   PCP: Elfredia Nevins, MD Urology: Dr. Isabel Caprice EP: Dr. Johney Frame  HF Cardiology: Dr. Shirlee Latch  HPI: Anita Warren is a 63 y.o. female with history of viral, non-ischemic cardiomyopathy, severe systolic dysfunction with EF of 10% s/p ICD pacemaker in the setting of VT 02/2011 (St. Jude);  cath 01/25/11 with no CAD.   Admitted 7/15 with acute on chronic systolic CHF she was not taking medications for 4 months because she lost her insurance. She was diuresed with IV lasix and transitioned to lasix 40 mg daily. She was also discharged on 12.5 mg carvedilol twice a day, hydralazine 25 mg tid, and 30 mg Imdur daily. Discharge weight was 176 pounds. She was not on an ACEI due to angioedema.  Admitted from HF clinic 06/02/14 with low output heart failure. Swan placed and showed cardiogenic shock. Ultimately discharged on  Milrinone 0.25 mcg/kg/min. Hospital stay was complicated by atrial flutter. Loaded on amiodarone and started on eliquis 5 mg twice a day. Met with Dr Donata Clay and VAD coordinator. She was titrated off the milrinone.  Echo in 8/17 showed EF up to 50%, echo in 8/18 showed EF 45%.  Echo in 11/19 showed EF 35-40%. Echo in 11/21 showed EF stable 40% with normal RV.  Echo in 12/22 showed EF 35-40%, moderate LV dilation, mildly decreased RV systolic function, moderate pericardial effusion.   Echo 6/24, EF 35% with diffuse hypokinesis, mildly decreased RV systolic function, IVC normal.   Today she returns for HF follow up. Overall feeling great. She is not SOB with activity, good energy levels. Denies palpitations, abnormal bleeding, CP, dizziness, edema, or PND/Orthopnea. Appetite ok. No fever or chills. Weight at home 174 pounds. Taking all medications. Had a recent breast mass that biopsy showed was bengign, planning to see general surgeon for excision soon. Recently remarried.  Device interrogation (personally reviewed): CorVue stable, < 1% VP, no  VT  ECG (personally reviewed): NSR 83 bpm  - Echo (7/15): EF 5-10% RV mod to severely dilated.  - Echo (10/15): EF 20-25% RV read as normal - Echo (8/17):  EF 50%, mild LV dilation, normal RV size and systolic function.  - Echo (8/18): EF 45%, diffuse HK worse inferiorly, normal RV size and systolic function.  - Echo (11/19): EF 35-40%, mild LV dilation with diffuse hypokinesis, normal RV size with mildly decreased systolic function.  - Echo (11/21): EF 40%, hypokinesis of the basal to mid septum and the basal inferior wall, RV normal, small pericardial effusion.  - Echo (12/22): EF 35-40%, moderate LV dilation, mildly decreased RV systolic function, moderate pericardial effusion. - Echo (6/24): EF 35% with diffuse hypokinesis, mildly decreased RV systolic function, IVC normal.   Labs:  HIV negative 2015  2/24: K 3.6, creatinine 1.5 6/24: K 4.1, creatinine 1.44  2012/01/25 Colonoscopy Blood Type:  B+  Lower Extremity Doppler - Normal arterial flow Carotid Doppler-  1-39% stenosis involving the right internal carotid artery and the left internal carotid Artery.  SH: Disabled. Has 3 grown children. Does not smoke or drink alcohol.  Re-married (fiance passed away in 2020-01-25). 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    FH: Mom died at 11 CAD         Sister and Brother HTN   Review of systems complete and found to be negative unless listed in HPI.    Past Medical History:  Diagnosis Date   AICD (automatic cardioverter/defibrillator) present    St.  Jude AICD implanted 03-07-2011 Dr. Allred/-Dr. Shirlee Latch now follows   Atrial fibrillation (HCC)    Cardiomyopathy (HCC) 2012   CHF (congestive heart failure) (HCC)    meds controlling, no episodes since 2014   Chronic systolic heart failure (HCC)    Essential hypertension, benign    Headache    History of kidney stones    multiple kidney stones in past   History of medication noncompliance    Hydronephrosis with renal and ureteral calculus obstruction  09/05/2013   Nonischemic cardiomyopathy (HCC)    LVEF 5-10%, likely viral (no CAD by cath 01/30/11)   NSVT (nonsustained ventricular tachycardia) (HCC)    Obesity    Current Outpatient Medications  Medication Sig Dispense Refill   acetaminophen (TYLENOL) 500 MG tablet Take 1,000 mg by mouth every 6 (six) hours as needed for headache.      carvedilol (COREG) 25 MG tablet TAKE 1 TABLET BY MOUTH TWICE DAILY WITH A MEAL 180 tablet 3   Coenzyme Q10 (CO Q 10) 100 MG CAPS Take 100 mg by mouth in the morning.     dapagliflozin propanediol (FARXIGA) 10 MG TABS tablet Take 1 tablet (10 mg total) by mouth daily before breakfast. 90 tablet 3   diphenhydrAMINE (BENADRYL) 25 MG tablet Take 25 mg by mouth every 6 (six) hours as needed (headache).      ELIQUIS 5 MG TABS tablet Take 1 tablet by mouth twice daily 60 tablet 0   Ergocalciferol (VITAMIN D2) 50 MCG (2000 UT) TABS Take 1 tablet by mouth once a week. Takes on Wednesdays     furosemide (LASIX) 20 MG tablet Take 1 tablet (20 mg total) by mouth daily. 90 tablet 3   hydrALAZINE (APRESOLINE) 100 MG tablet TAKE 1 TABLET BY MOUTH THREE TIMES DAILY . APPOINTMENT REQUIRED FOR FUTURE REFILLS 90 tablet 0   isosorbide mononitrate (IMDUR) 30 MG 24 hr tablet Take 2 tablets (60 mg total) by mouth daily. 60 tablet 6   potassium chloride (KLOR-CON) 10 MEQ tablet TAKE 1 TABLET BY MOUTH ONCE DAILY . APPOINTMENT REQUIRED FOR FUTURE REFILLS 60 tablet 1   spironolactone (ALDACTONE) 50 MG tablet Take 1 tablet by mouth once daily 90 tablet 3   traMADol (ULTRAM) 50 MG tablet Take 1 tablet (50 mg total) by mouth every 6 (six) hours as needed. 15 tablet 0   No current facility-administered medications for this encounter.   BP 112/72   Pulse 94   Wt 80.3 kg (177 lb)   LMP 10/28/1999   SpO2 96%   BMI 30.38 kg/m   Wt Readings from Last 3 Encounters:  01/06/24 80.3 kg (177 lb)  08/24/23 78.4 kg (172 lb 12.8 oz)  04/23/23 79.7 kg (175 lb 9.6 oz)   PHYSICAL  EXAM: General:  NAD. No resp difficulty HEENT: Normal Neck: Supple. No JVD. Cor: Regular rate & rhythm. No rubs, gallops or murmurs. Lungs: Clear Abdomen: Soft, nontender, nondistended.  Extremities: No cyanosis, clubbing, rash, edema Neuro: Alert & oriented x 3, moves all 4 extremities w/o difficulty. Affect pleasant.  ASSESSMENT & PLAN: 1. Chronic Systolic Heart Failure: Nonischemic cardiomyopathy thought to potentially be due to viral myocarditis noted initially in 2011. Cath 2012 normal coronaries.  St Jude ICD. Echo (7/15) with EF 5-10% and RV mod-severely dilated with moderately decreased systolic function. Repeat echo 10/15 with EF up to 20-25%. She was on milrinone briefly then stopped.  Echo in 8/17 with EF up to 50%. Repeat echo 11/19 showed EF back down  to 35-40% with normal RV. Echo in 11/21 showed stable EF 40% and septal + inferior wall motion abnormalities.  Echo in 12/22 showed EF 35-40%, mild RV dysfunction, moderate pericardial effusion.  Echo 6/24 EF 35%, mild RV dysfunction. NYHA class I, she is not volume overloaded by exam or device interrogation.  - Continue Lasix 20 mg daily + 10 KCL daily, BMET today. - Continue Farxiga 10 mg daily. No GU symptoms - Continue spironolactone 50 mg daily.  - Continue Coreg 25 mg bid.   - Continue hydralazine 100 mg tid + Imdur 60 mg bid - No ACEI/ARB/ARNI due to recurrent episodes of angioedema with ACEI and also ARB.  - Update echo next visit. 2. Atrial flutter: Paroxysmal, NSR on ECG today. - She is now off amiodarone.  If atrial flutter recurs, should have ablation.  - Continue Eliquis 5 mg bid. No bleeding issues. CBC today. 3. Angioedema: Had in past on ACEI, had another episode after ACEI stopped.  She is no longer on ACEI, ARNI, or ARB. ? Hereditary or acquired C1 inhibitor deficiency. No further problems with this. 4. CKD: Stage 3b. Baseline SCr 1.3-1.5. - Continue SGLT2i. BMET today. 5. Pre-Op cardiac risk stratification: She  is seeing gen surgery soon for consultation for breast mass/papilloma excision, she is at an acceptable risk from CV standpoint for procedure.  Follow up in 3 months with Dr. Shirlee Latch + echo  Anderson Malta Gleason, [place]  01/06/2024

## 2024-01-13 NOTE — Addendum Note (Signed)
 Addended by: Elease Etienne A on: 01/13/2024 04:32 PM   Modules accepted: Orders

## 2024-01-13 NOTE — Progress Notes (Signed)
 Remote ICD transmission.

## 2024-01-20 ENCOUNTER — Encounter: Payer: Self-pay | Admitting: Cardiology

## 2024-01-24 ENCOUNTER — Other Ambulatory Visit (HOSPITAL_COMMUNITY): Payer: Self-pay | Admitting: Cardiology

## 2024-01-24 DIAGNOSIS — I5022 Chronic systolic (congestive) heart failure: Secondary | ICD-10-CM

## 2024-01-26 ENCOUNTER — Ambulatory Visit: Payer: 59 | Admitting: General Surgery

## 2024-01-26 ENCOUNTER — Encounter: Payer: Self-pay | Admitting: General Surgery

## 2024-01-26 VITALS — BP 99/61 | HR 77 | Temp 98.0°F | Resp 16 | Ht 64.0 in | Wt 173.0 lb

## 2024-01-26 DIAGNOSIS — R2232 Localized swelling, mass and lump, left upper limb: Secondary | ICD-10-CM | POA: Diagnosis not present

## 2024-01-26 NOTE — Progress Notes (Signed)
 Anita Warren; [id number]; February 17, 1961   HPI Patient is a 63 year old black female who was referred back to my care by the breast center for a left breast papilloma.  This was found on routine screening mammography.  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  Patient states she has not felt a lump.  She has had multiple breast biopsies in the past which have been negative.  Incidentally, she reports that she has noticed some growth and a left axillary lipoma.  She has had this lipoma for many years but states that it seems to be bigger and extending around to her back.  Her history is significant for an AICD in place in the left upper chest. Past Medical History:  Diagnosis Date   AICD (automatic cardioverter/defibrillator) present    St. Jude AICD implanted 03-07-2011 Dr. Allred/-Dr. Shirlee Latch now follows   Atrial fibrillation (HCC)    Cardiomyopathy (HCC) 2012   CHF (congestive heart failure) (HCC)    meds controlling, no episodes since 2014   Chronic systolic heart failure (HCC)    Essential hypertension, benign    Headache    History of kidney stones    multiple kidney stones in past   History of medication noncompliance    Hydronephrosis with renal and ureteral calculus obstruction 09/05/2013   Nonischemic cardiomyopathy (HCC)    LVEF 5-10%, likely viral (no CAD by cath 01/30/11)   NSVT (nonsustained ventricular tachycardia) (HCC)    Obesity     Past Surgical History:  Procedure Laterality Date   ABDOMINAL HYSTERECTOMY     BREAST BIOPSY Left 10/19/2019   Procedure: BREAST BIOPSY WITH NEEDLE LOCALIZATION;  Surgeon: Franky Macho, MD;  Location: [facility];  Service: General;  Laterality: Left;   BREAST BIOPSY Right 11/18/2022   Intraductal papilloma/US RT BREAST BX W LOC DEV 1ST LESION IMG BX SPEC US GUIDE 11/18/2022 [facility]   BREAST BIOPSY Left 12/22/2023   [place] LT BREAST BX W LOC DEV 1ST LESION IMG BX SPEC US GUIDE 12/22/2023  [facility]   BREAST BIOPSY WITH RADIO FREQUENCY LOCALIZER Right 01/14/2023   Intraductal papilloma/Procedure: BREAST BIOPSY WITH RADIO FREQUENCY LOCALIZER;  Surgeon: Franky Macho, MD;  Location: [facility];  Service: General;  Laterality: Right;   BREAST LUMPECTOMY  1989   L breast- benign   CARDIAC CATHETERIZATION     CARDIAC DEFIBRILLATOR PLACEMENT  02/2011   SJM by Fawn Kirk   CESAREAN SECTION     x 2   CHOLECYSTECTOMY     COLONOSCOPY  07/02/2012   Procedure: COLONOSCOPY;  Surgeon: West Bali, MD;  Location: [facility];  Service: Endoscopy;  Laterality: N/A;  1:15/PATIENT HAS A DEFIBRILLATOR   CYSTOSCOPY W/ URETERAL STENT PLACEMENT Right 09/06/2013   Procedure: CYSTOSCOPY WITH RIGHT RETROGRADE PYELOGRAM; RIGHT URETERAL STENT PLACEMENT;  Surgeon: Ky Barban, MD;  Location: [facility];  Service: Urology;  Laterality: Right;   CYSTOSCOPY W/ URETERAL STENT PLACEMENT N/A 04/28/2016   Procedure: CYSTOSCOPY WITH  RIGHT RETROGRADE PYELOGRAM/RIGHT URETERAL STENT PLACEMENT;  Surgeon: Barron Alvine, MD;  Location: [facility];  Service: Urology;  Laterality: N/A;   CYSTOSCOPY W/ URETERAL STENT REMOVAL Right 07/21/2016   Procedure: CYSTOSCOPY WITH STENT REMOVAL;  Surgeon: Marcine Matar, MD;  Location: [facility];  Service: Urology;  Laterality: Right;   CYSTOSCOPY/URETEROSCOPY/HOLMIUM LASER/STENT PLACEMENT Right 07/21/2016   Procedure: CYSTOSCOPY/URETEROSCOPY/HOLMIUM LASER/   right retrograde pylegram;  Surgeon: Marcine Matar, MD;  Location: [facility];  Service: Urology;  Laterality: Right;  endovenous laser ablation and stab phlebectomies Right 08/01/2020   EVLA of right greater saphenous vein and > 20 stab phlebectomies   ICD GENERATOR CHANGEOUT N/A 12/05/2022   Procedure: ICD GENERATOR CHANGEOUT;  Surgeon: Nelly Laurence, Roberts Gaudy, MD;  Location: [facility] INVASIVE CV LAB;  Service: Cardiovascular;  Laterality: N/A;   TONSILLECTOMY     TUBAL LIGATION      Family History  Problem Relation Age of Onset    Diabetes Mother    Hypertension Brother    Breast cancer Sister    Diabetes Sister        borderline   Colon cancer Neg Hx     Current Outpatient Medications on File Prior to Visit  Medication Sig Dispense Refill   acetaminophen (TYLENOL) 500 MG tablet Take 1,000 mg by mouth every 6 (six) hours as needed for headache.      apixaban (ELIQUIS) 5 MG TABS tablet Take 1 tablet (5 mg total) by mouth 2 (two) times daily. 60 tablet 5   carvedilol (COREG) 25 MG tablet TAKE 1 TABLET BY MOUTH TWICE DAILY WITH A MEAL 180 tablet 3   Coenzyme Q10 (CO Q 10) 100 MG CAPS Take 100 mg by mouth in the morning.     dapagliflozin propanediol (FARXIGA) 10 MG TABS tablet Take 1 tablet (10 mg total) by mouth daily before breakfast. 90 tablet 3   diphenhydrAMINE (BENADRYL) 25 MG tablet Take 25 mg by mouth every 6 (six) hours as needed (headache).      Ergocalciferol (VITAMIN D2) 50 MCG (2000 UT) TABS Take 1 tablet by mouth once a week. Takes on Wednesdays     furosemide (LASIX) 20 MG tablet Take 1 tablet (20 mg total) by mouth daily. 90 tablet 3   hydrALAZINE (APRESOLINE) 100 MG tablet Take 1 tablet (100 mg total) by mouth 3 (three) times daily. 90 tablet 5   isosorbide mononitrate (IMDUR) 30 MG 24 hr tablet Take 2 tablets (60 mg total) by mouth daily. 60 tablet 6   potassium chloride (KLOR-CON) 10 MEQ tablet TAKE 1 TABLET BY MOUTH ONCE DAILY . APPOINTMENT REQUIRED FOR FUTURE REFILLS 60 tablet 0   spironolactone (ALDACTONE) 50 MG tablet Take 1 tablet by mouth once daily 90 tablet 3   traMADol (ULTRAM) 50 MG tablet Take 1 tablet (50 mg total) by mouth every 6 (six) hours as needed. 15 tablet 0   No current facility-administered medications on file prior to visit.    Allergies  Allergen Reactions   Peanut-Containing Drug Products Anaphylaxis   Ace Inhibitors Swelling   Morphine Nausea And Vomiting   Demerol Rash    Social History   Substance and Sexual Activity  Alcohol Use No    Social History    Tobacco Use  Smoking Status Never  Smokeless Tobacco Never    Review of Systems  Constitutional: Negative.   HENT: Negative.    Eyes: Negative.   Respiratory: Negative.    Cardiovascular: Negative.   Gastrointestinal: Negative.   Genitourinary: Negative.   Musculoskeletal: Negative.   Skin: Negative.   Neurological:  Positive for headaches.  Endo/Heme/Allergies:  Bruises/bleeds easily.  Psychiatric/Behavioral: Negative.      Objective   Vitals:   01/26/24 1301  BP: 99/61  Pulse: 77  Resp: 16  Temp: 98 F (36.7 C)  SpO2: 97%    Physical Exam Vitals reviewed.  Constitutional:      Appearance: Normal appearance. She is normal weight. She is not ill-appearing.  HENT:  Head: Normocephalic and atraumatic.  Cardiovascular:     Rate and Rhythm: Normal rate and regular rhythm.     Heart sounds: Normal heart sounds. No murmur heard.    No friction rub. No gallop.  Pulmonary:     Effort: Pulmonary effort is normal. No respiratory distress.     Breath sounds: Normal breath sounds. No stridor. No wheezing, rhonchi or rales.  Skin:    General: Skin is warm and dry.  Neurological:     Mental Status: She is alert and oriented to person, place, and time.   Breast: No dominant mass, nipple discharge, or dimpling is noted in the left breast.  No left axillary lymphadenopathy is noted, though a large firm mobile oval greater than 10 cm subcutaneous mass is noted along the inferior aspect of the left axilla.  Right breast examination reveals no dominant mass, nipple discharge, or dimpling.  Right axilla is negative for palpable nodes. Mammography reveals a 0.5 cm mass in the 12 o'clock position in the left breast.  Pathology reveals an intraductal papilloma.  Assessment  Left breast intraductal papilloma Enlarging left axillary mass Congestive heart failure with AICD in place.  Last echo showed a 35% ejection fraction. Plan  I am concerned about the enlargement of her left  axillary lipoma.  Will get CT scan of chest to further evaluate.  Patient will return to the office after this is done.  Further management is pending those results.

## 2024-02-01 ENCOUNTER — Ambulatory Visit: Attending: Cardiovascular Disease

## 2024-02-01 DIAGNOSIS — Z95810 Presence of automatic (implantable) cardiac defibrillator: Secondary | ICD-10-CM | POA: Diagnosis not present

## 2024-02-01 DIAGNOSIS — I5022 Chronic systolic (congestive) heart failure: Secondary | ICD-10-CM | POA: Diagnosis not present

## 2024-02-03 NOTE — Progress Notes (Signed)
 EPIC Encounter for ICM Monitoring  Patient Name: Anita Warren is a 63 y.o. female Date: 02/03/2024 Primary Care Physican: Elfredia Nevins, MD Primary Cardiologist: Shirlee Latch Electrophysiologist: Mealor 11/27/2023 Weight: 174 lbs 01/01/2024 Weight: 174 lbs            02/03/2024 Weight: 171 lbs                                                Spoke with patient and heart failure questions reviewed.  Transmission results reviewed.  Pt asymptomatic for fluid accumulation.  Reports feeling well at this time and voices no complaints.     Diet:  Pt is not consistent with fluid intake   Coruve thoracic impedance suggesting possible dryness starting 4/6.  Suggesting possible fluid accumulation from 3/23-4/4.   Prescribed:  Furosemide 20 mg take 1 tablet (20 mg total) daily  Potassium 10 mEq take 1 tablet daily. Spironolactone 50 mg take 1 tablet daily     Labs: 01/06/2024 Creatinine 1.74, BUN 22, Potassium 4.4, Sodium 137, GFR 33 A complete set of results can be found in Results Review.   Recommendations:   No changes and encouraged to call if experiencing any fluid symptoms.   Follow-up plan: ICM clinic phone appointment on 03/11/2024.   91 day device clinic remote transmission 03/09/2024.      EP/Cardiology Office Visits: 04/01/2024 with Dr Nelly Laurence.  04/07/2024 with Dr Shirlee Latch.   Copy of ICM check sent to Dr. Nelly Laurence.    3 month ICM trend: 01/31/2024.    12-14 Month ICM trend:     [name], RN 02/03/2024 11:55 AM

## 2024-02-08 ENCOUNTER — Ambulatory Visit (HOSPITAL_COMMUNITY)
Admission: RE | Admit: 2024-02-08 | Discharge: 2024-02-08 | Disposition: A | Discharge status: Home / Self Care | Source: Ambulatory Visit | Attending: General Surgery | Admitting: General Surgery

## 2024-02-08 ENCOUNTER — Other Ambulatory Visit: Payer: Self-pay | Admitting: General Surgery

## 2024-02-08 DIAGNOSIS — R2232 Localized swelling, mass and lump, left upper limb: Secondary | ICD-10-CM | POA: Diagnosis not present

## 2024-02-08 DIAGNOSIS — I700 Atherosclerosis of aorta: Secondary | ICD-10-CM | POA: Insufficient documentation

## 2024-02-08 DIAGNOSIS — R160 Hepatomegaly, not elsewhere classified: Secondary | ICD-10-CM | POA: Diagnosis not present

## 2024-02-08 DIAGNOSIS — I517 Cardiomegaly: Secondary | ICD-10-CM | POA: Insufficient documentation

## 2024-02-08 DIAGNOSIS — I3139 Other pericardial effusion (noninflammatory): Secondary | ICD-10-CM | POA: Diagnosis not present

## 2024-02-08 DIAGNOSIS — R222 Localized swelling, mass and lump, trunk: Secondary | ICD-10-CM | POA: Diagnosis not present

## 2024-02-08 LAB — POCT I-STAT CREATININE: Creatinine, Ser: 1.9 mg/dL — ABNORMAL HIGH (ref 0.44–1.00)

## 2024-02-08 MED ORDER — IOHEXOL 350 MG/ML SOLN: 75.0000 mL | Freq: Once | INTRAVENOUS | Status: DC | PRN

## 2024-02-10 ENCOUNTER — Other Ambulatory Visit (HOSPITAL_COMMUNITY): Payer: Self-pay | Admitting: Cardiology

## 2024-02-23 ENCOUNTER — Encounter: Payer: Self-pay | Admitting: General Surgery

## 2024-02-23 ENCOUNTER — Ambulatory Visit: Admitting: General Surgery

## 2024-02-23 VITALS — BP 129/75 | HR 70 | Temp 98.1°F | Resp 16 | Ht 64.0 in | Wt 174.0 lb

## 2024-02-23 DIAGNOSIS — R2232 Localized swelling, mass and lump, left upper limb: Secondary | ICD-10-CM | POA: Diagnosis not present

## 2024-02-23 DIAGNOSIS — D242 Benign neoplasm of left breast: Secondary | ICD-10-CM

## 2024-02-23 NOTE — Patient Instructions (Signed)
 [facility]  02/29/2024 at 1:45 pm Arrive at 1:15 pm for check in

## 2024-02-24 NOTE — Progress Notes (Signed)
 Patient presents to review her CT scan results.    CT CHEST WO CONTRAST (Accession [id number]) (Order [id number]) Imaging Date: 02/08/2024 Department: [facility] Released By: Creola Doheny Authorizing: Alanda Allegra, MD   Exam Status  Status  Final [99]   PACS Intelerad Image Link   Show images for CT CHEST WO CONTRAST Study Result  Narrative & Impression  CLINICAL DATA:  left axillary mass   EXAM: CT CHEST WITHOUT CONTRAST   TECHNIQUE: Multidetector CT imaging of the chest was performed following the standard protocol without IV contrast.   RADIATION DOSE REDUCTION: This exam was performed according to the departmental dose-optimization program which includes automated exposure control, adjustment of the mA and/or kV according to patient size and/or use of iterative reconstruction technique.   COMPARISON:  01/23/2015 chest CT   FINDINGS: Cardiovascular: Stable mild cardiomegaly. Small pericardial effusion, increased from prior chest CT. Single lead left subclavian ICD with lead tip in the right ventricular apex. Atherosclerotic nonaneurysmal thoracic aorta. Normal caliber pulmonary arteries.   Mediastinum/Nodes: No significant thyroid  nodules. Unremarkable esophagus. No pathologically enlarged axillary, mediastinal or hilar lymph nodes, noting limited sensitivity for the detection of hilar adenopathy on this noncontrast study. Circumscribed 10.6 x 9.1 x 8.9 cm intramuscular left axillary mass (series 2/image 48), predominantly fat density although with nodular 0.9 cm soft tissue focus anteriorly and separate plaque-like calcific focus anteriorly, increased from 6.4 x 5.8 x 6.9 cm on 01/23/2015 chest CT.   Lungs/Pleura: No pneumothorax. No pleural effusion. No acute consolidative airspace disease or lung masses. A few scattered tiny solid pulmonary nodules in both lungs, largest 0.3 cm in the left upper lobe (series 3/image 40), all stable. No new  significant pulmonary nodules.   Upper abdomen: Indeterminate hypodense 2.4 cm peripheral right liver lesion (series 2/image 108), not substantially changed from 01/23/2015 chest CT, presumably benign. Cholecystectomy.   Musculoskeletal: No aggressive appearing focal osseous lesions. Moderate lower thoracic spondylosis.   IMPRESSION: 1. Circumscribed 10.6 x 9.1 x 8.9 cm intramuscular left axillary mass, predominantly fat density although with nodular 0.9 cm soft tissue focus anteriorly and separate plaque-like calcific focus anteriorly, increased from 6.4 x 5.8 x 6.9 cm on 01/23/2015 chest CT. Low-grade liposarcoma cannot be excluded. Suggest surgical consultation. 2. Stable mild cardiomegaly. Small pericardial effusion, increased from prior chest CT. 3. Indeterminate hypodense 2.4 cm peripheral right liver lesion, not substantially changed from 01/23/2015 chest CT, presumably benign. 4.  Aortic Atherosclerosis (ICD10-I70.0).     Electronically Signed   By: Levell Reach M.D.   On: 02/18/2024 09:06      Given the enlargement of the left axillary mass as well as her core morbidities including a borderline cardiac ejection fraction, I told the patient I would feel more comfortable with her being treated down at [facility].  She may also need plastic surgery involvement for closure.  She understands I am concerned that this may be containing some malignancy.  I will refer her to Dr. Cherlynn Cornfield for both a left breast biopsy and excision of the left axillary mass.  Patient is scheduled for a follow-up appointment with [facility] next week.

## 2024-02-29 ENCOUNTER — Other Ambulatory Visit: Payer: Self-pay | Admitting: General Surgery

## 2024-02-29 ENCOUNTER — Encounter: Payer: Self-pay | Admitting: General Surgery

## 2024-02-29 ENCOUNTER — Telehealth (HOSPITAL_BASED_OUTPATIENT_CLINIC_OR_DEPARTMENT_OTHER): Payer: Self-pay | Admitting: *Deleted

## 2024-02-29 DIAGNOSIS — I429 Cardiomyopathy, unspecified: Secondary | ICD-10-CM | POA: Diagnosis not present

## 2024-02-29 DIAGNOSIS — R2232 Localized swelling, mass and lump, left upper limb: Secondary | ICD-10-CM | POA: Diagnosis not present

## 2024-02-29 DIAGNOSIS — D242 Benign neoplasm of left breast: Secondary | ICD-10-CM | POA: Diagnosis not present

## 2024-02-29 NOTE — Telephone Encounter (Signed)
 Patient last seen by Dr. Arlester Ladd on 04/10/23. She has scheduled follow up with him on 04/01/24.

## 2024-02-29 NOTE — Telephone Encounter (Signed)
   Pre-operative Risk Assessment    Patient Name: Anita Warren  DOB: [birth year] MRN: [id number]   Date of last office visit: 04/10/2023 Date of next office visit: 04/01/2024. Was sent to device.   Request for Surgical Clearance    Procedure:  Breast Surgery  Date of Surgery:  Clearance TBD                                 Surgeon:  Dr. Lockie Rima Surgeon's Group or Practice Name:  [facility] Phone number:  [phone] Fax number:  [phone]   Type of Clearance Requested:   - Medical  - Pharmacy:  Hold Apixaban  (Eliquis ) Not Indicated   Type of Anesthesia:  General    Additional requests/questions:    Signed, Lauris Port   02/29/2024, 3:12 PM

## 2024-03-01 ENCOUNTER — Encounter: Payer: Self-pay | Admitting: General Surgery

## 2024-03-01 DIAGNOSIS — D242 Benign neoplasm of left breast: Secondary | ICD-10-CM

## 2024-03-01 NOTE — Telephone Encounter (Signed)
   Primary Cardiologist: None  Chart reviewed as part of pre-operative protocol coverage. Given past medical history and time since last visit, based on ACC/AHA guidelines, Anita Warren would be at acceptable risk for the planned procedure without further cardiovascular testing.   Patient was advised that if she develops new symptoms prior to surgery to contact our office to arrange a follow-up appointment. She verbalized understanding.  Per office protocol, she may hold Eliquis  for 2 days prior to procedure and should resume post operatively when hemodynamically stable.  I will route this recommendation to the requesting party via Epic fax function and remove from pre-op pool.  Please call with questions.  Gerldine Koch, NP-C  03/01/2024, 12:07 PM [address] [place], [place] [address] Office [phone] Fax [phone]

## 2024-03-01 NOTE — Telephone Encounter (Signed)
 Patient's chart was reviewed for preoperative cardiac evaluation for upcoming breast surgery.  She was seen by you on 01/06/2024 and according to protocol, we request that you comment on cardiac risk for upcoming procedure since office visit was less than 2 months ago.    Please route your response to p cv div preop.  Thank you, Gerldine Koch, NP-C 03/01/2024, 10:13 AM

## 2024-03-02 ENCOUNTER — Other Ambulatory Visit: Payer: Self-pay | Admitting: General Surgery

## 2024-03-02 DIAGNOSIS — R2232 Localized swelling, mass and lump, left upper limb: Secondary | ICD-10-CM

## 2024-03-02 DIAGNOSIS — D242 Benign neoplasm of left breast: Secondary | ICD-10-CM

## 2024-03-04 ENCOUNTER — Ambulatory Visit
Admission: RE | Admit: 2024-03-04 | Discharge: 2024-03-04 | Disposition: A | Discharge status: Home / Self Care | Source: Ambulatory Visit | Attending: General Surgery | Admitting: General Surgery

## 2024-03-04 DIAGNOSIS — D242 Benign neoplasm of left breast: Secondary | ICD-10-CM | POA: Diagnosis not present

## 2024-03-04 DIAGNOSIS — R2232 Localized swelling, mass and lump, left upper limb: Secondary | ICD-10-CM | POA: Diagnosis not present

## 2024-03-09 ENCOUNTER — Ambulatory Visit (INDEPENDENT_AMBULATORY_CARE_PROVIDER_SITE_OTHER): Payer: 59

## 2024-03-09 ENCOUNTER — Other Ambulatory Visit: Payer: Self-pay | Admitting: General Surgery

## 2024-03-09 ENCOUNTER — Other Ambulatory Visit (HOSPITAL_COMMUNITY)

## 2024-03-09 DIAGNOSIS — I428 Other cardiomyopathies: Secondary | ICD-10-CM

## 2024-03-09 DIAGNOSIS — D242 Benign neoplasm of left breast: Secondary | ICD-10-CM

## 2024-03-09 LAB — CUP PACEART REMOTE DEVICE CHECK
Battery Remaining Longevity: 107 mo
Battery Remaining Percentage: 86 %
Battery Voltage: 3.01 V
Brady Statistic RV Percent Paced: 1 %
Date Time Interrogation Session: 20250513220534
HighPow Impedance: 40 Ohm
Implantable Lead Connection Status: 753985
Implantable Lead Implant Date: 20120511
Implantable Lead Location: 753860
Implantable Lead Model: 7120
Implantable Pulse Generator Implant Date: 20240209
Lead Channel Impedance Value: 510 Ohm
Lead Channel Pacing Threshold Amplitude: 0.5 V
Lead Channel Pacing Threshold Pulse Width: 0.5 ms
Lead Channel Sensing Intrinsic Amplitude: 10.6 mV
Lead Channel Setting Pacing Amplitude: 2.5 V
Lead Channel Setting Pacing Pulse Width: 0.5 ms
Lead Channel Setting Sensing Sensitivity: 0.5 mV
Pulse Gen Serial Number: 211012829
Zone Setting Status: 755011

## 2024-03-11 ENCOUNTER — Ambulatory Visit: Payer: Self-pay | Admitting: Cardiovascular Disease

## 2024-03-11 ENCOUNTER — Ambulatory Visit: Attending: Cardiovascular Disease

## 2024-03-11 DIAGNOSIS — I5022 Chronic systolic (congestive) heart failure: Secondary | ICD-10-CM

## 2024-03-11 DIAGNOSIS — Z95810 Presence of automatic (implantable) cardiac defibrillator: Secondary | ICD-10-CM

## 2024-03-11 NOTE — Progress Notes (Signed)
 EPIC Encounter for ICM Monitoring  Patient Name: Anita Warren is a 63 y.o. female Date: 03/11/2024 Primary Care Physican: Kathyleen Parkins, MD Primary Cardiologist: Mitzie Anda Electrophysiologist: Mealor 11/27/2023 Weight: 174 lbs 01/01/2024 Weight: 174 lbs            02/03/2024 Weight: 171 lbs                                                Spoke with patient and heart failure questions reviewed.  Transmission results reviewed.  Pt asymptomatic for fluid accumulation.  Reports feeling well at this time and voices no complaints.     Diet:  Fluid intake is not consistent   Coruve thoracic impedance suggesting possible dryness starting 5/13.    Prescribed:  Furosemide  20 mg take 1 tablet (20 mg total) daily  Potassium 10 mEq take 1 tablet daily. Spironolactone  50 mg take 1 tablet daily     Labs: 01/06/2024 Creatinine 1.74, BUN 22, Potassium 4.4, Sodium 137, GFR 33 A complete set of results can be found in Results Review.   Recommendations:  No changes and encouraged to call if experiencing any fluid symptoms.   Follow-up plan: ICM clinic phone appointment on 04/11/2024.   91 day device clinic remote transmission 06/09/2024.      EP/Cardiology Office Visits:  04/01/2024 with Dr Arlester Ladd.  04/07/2024 with Dr Mitzie Anda.   Copy of ICM check sent to Dr. Arlester Ladd.    3 month ICM trend: 03/10/2024.    12-14 Month ICM trend:     Almyra Jain, RN 03/11/2024 7:19 AM

## 2024-03-15 ENCOUNTER — Ambulatory Visit: Admitting: Plastic Surgery

## 2024-03-15 ENCOUNTER — Encounter: Payer: Self-pay | Admitting: Plastic Surgery

## 2024-03-15 VITALS — BP 116/65 | HR 78 | Ht 64.0 in | Wt 175.4 lb

## 2024-03-15 DIAGNOSIS — I5021 Acute systolic (congestive) heart failure: Secondary | ICD-10-CM

## 2024-03-15 DIAGNOSIS — D241 Benign neoplasm of right breast: Secondary | ICD-10-CM | POA: Diagnosis not present

## 2024-03-15 DIAGNOSIS — I5023 Acute on chronic systolic (congestive) heart failure: Secondary | ICD-10-CM | POA: Diagnosis not present

## 2024-03-15 NOTE — Progress Notes (Signed)
 Patient ID: Anita Warren, female    DOB: [birth year], 63 y.o.   MRN: [id number]   Chief Complaint  Patient presents with   Consult   Breast Problem    The patient is a very sweet 63 year old lady here for evaluation of a large mass in the left axillary area.  She has had films and a biopsy done already.  The biopsy done on May 9 showed a spindle cell proliferation of the left axillary mass.  He has already seen Dr. Cherlynn Cornfield and is planning on having this removed.  The lesion is very firm and I could imagine quite tender for the location it really restricts her from lowering her arm.    Review of Systems  Constitutional: Negative.   HENT: Negative.    Eyes: Negative.   Respiratory: Negative.    Cardiovascular: Negative.   Gastrointestinal: Negative.   Endocrine: Negative.   Genitourinary: Negative.   Musculoskeletal: Negative.     Past Medical History:  Diagnosis Date   AICD (automatic cardioverter/defibrillator) present    St[employer] Jude AICD implanted 03-07-2011 Dr. Allred/-Dr. Mitzie Anda now follows   Atrial fibrillation (HCC)    Cardiomyopathy (HCC) 2012   CHF (congestive heart failure) (HCC)    meds controlling, no episodes since 2014   Chronic systolic heart failure (HCC)    Essential hypertension, benign    Headache    History of kidney stones    multiple kidney stones in past   History of medication noncompliance    Hydronephrosis with renal and ureteral calculus obstruction 09/05/2013   Nonischemic cardiomyopathy (HCC)    LVEF 5-10%, likely viral (no CAD by cath 01/30/11)   NSVT (nonsustained ventricular tachycardia) (HCC)    Obesity     Past Surgical History:  Procedure Laterality Date   ABDOMINAL HYSTERECTOMY     BREAST BIOPSY Left 10/19/2019   Procedure: BREAST BIOPSY WITH NEEDLE LOCALIZATION;  Surgeon: Alanda Allegra, MD;  Location: [facility];  Service: General;  Laterality: Left;   BREAST BIOPSY Right 11/18/2022   Intraductal papilloma/US  RT BREAST BX W LOC DEV  1ST LESION IMG BX SPEC US  GUIDE 11/18/2022 [facility]   BREAST BIOPSY Left 12/22/2023   US  LT BREAST BX W LOC DEV 1ST LESION IMG BX SPEC US  GUIDE 12/22/2023 [facility]   BREAST BIOPSY WITH RADIO FREQUENCY LOCALIZER Right 01/14/2023   Intraductal papilloma/Procedure: BREAST BIOPSY WITH RADIO FREQUENCY LOCALIZER;  Surgeon: Alanda Allegra, MD;  Location: [facility];  Service: General;  Laterality: Right;   BREAST LUMPECTOMY  1989   L breast- benign   CARDIAC CATHETERIZATION     CARDIAC DEFIBRILLATOR PLACEMENT  02/2011   SJM by Joenathan Muslim   CESAREAN SECTION     x 2   CHOLECYSTECTOMY     COLONOSCOPY  07/02/2012   Procedure: COLONOSCOPY;  Surgeon: Alyce Jubilee, MD;  Location: [facility];  Service: Endoscopy;  Laterality: N/A;  1:15/PATIENT HAS A DEFIBRILLATOR   CYSTOSCOPY W/ URETERAL STENT PLACEMENT Right 09/06/2013   Procedure: CYSTOSCOPY WITH RIGHT RETROGRADE PYELOGRAM; RIGHT URETERAL STENT PLACEMENT;  Surgeon: Mohammad I Javaid, MD;  Location: [facility];  Service: Urology;  Laterality: Right;   CYSTOSCOPY W/ URETERAL STENT PLACEMENT N/A 04/28/2016   Procedure: CYSTOSCOPY WITH  RIGHT RETROGRADE PYELOGRAM/RIGHT URETERAL STENT PLACEMENT;  Surgeon: Ann Barnacle, MD;  Location: [facility];  Service: Urology;  Laterality: N/A;   CYSTOSCOPY W/ URETERAL STENT REMOVAL Right 07/21/2016   Procedure: CYSTOSCOPY WITH STENT REMOVAL;  Surgeon: Trent Frizzle, MD;  Location:  [facility];  Service: Urology;  Laterality: Right;   CYSTOSCOPY/URETEROSCOPY/HOLMIUM LASER/STENT PLACEMENT Right 07/21/2016   Procedure: CYSTOSCOPY/URETEROSCOPY/HOLMIUM LASER/   right retrograde pylegram;  Surgeon: Trent Frizzle, MD;  Location: [facility];  Service: Urology;  Laterality: Right;   endovenous laser ablation and stab phlebectomies Right 08/01/2020   EVLA of right greater saphenous vein and > 20 stab phlebectomies   ICD GENERATOR CHANGEOUT N/A 12/05/2022   Procedure: ICD GENERATOR CHANGEOUT;  Surgeon: Arlester Ladd, Donnamae Gaba, MD;  Location: [facility]  INVASIVE CV LAB;  Service: Cardiovascular;  Laterality: N/A;   TONSILLECTOMY     TUBAL LIGATION        Current Outpatient Medications:    acetaminophen  (TYLENOL ) 500 MG tablet, Take 1,000 mg by mouth every 6 (six) hours as needed for headache. , Disp: , Rfl:    apixaban  (ELIQUIS ) 5 MG TABS tablet, Take 1 tablet (5 mg total) by mouth 2 (two) times daily., Disp: 60 tablet, Rfl: 5   carvedilol  (COREG ) 25 MG tablet, TAKE 1 TABLET BY MOUTH TWICE DAILY WITH A MEAL, Disp: 180 tablet, Rfl: 3   Coenzyme Q10 (CO Q 10) 100 MG CAPS, Take 100 mg by mouth in the morning., Disp: , Rfl:    diphenhydrAMINE  (BENADRYL ) 25 MG tablet, Take 25 mg by mouth every 6 (six) hours as needed (headache). , Disp: , Rfl:    Ergocalciferol (VITAMIN D2) 50 MCG (2000 UT) TABS, Take 1 tablet by mouth once a week. Takes on Wednesdays, Disp: , Rfl:    FARXIGA  10 MG TABS tablet, TAKE 1 TABLET BY MOUTH ONCE DAILY BEFORE BREAKFAST, Disp: 90 tablet, Rfl: 0   furosemide  (LASIX ) 20 MG tablet, Take 1 tablet (20 mg total) by mouth daily., Disp: 90 tablet, Rfl: 3   hydrALAZINE  (APRESOLINE ) 100 MG tablet, Take 1 tablet (100 mg total) by mouth 3 (three) times daily., Disp: 90 tablet, Rfl: 5   isosorbide  mononitrate (IMDUR ) 30 MG 24 hr tablet, Take 2 tablets (60 mg total) by mouth daily., Disp: 60 tablet, Rfl: 6   potassium chloride  (KLOR-CON ) 10 MEQ tablet, TAKE 1 TABLET BY MOUTH ONCE DAILY . APPOINTMENT REQUIRED FOR FUTURE REFILLS, Disp: 60 tablet, Rfl: 0   spironolactone  (ALDACTONE ) 50 MG tablet, Take 1 tablet by mouth once daily, Disp: 90 tablet, Rfl: 3   traMADol  (ULTRAM ) 50 MG tablet, Take 1 tablet (50 mg total) by mouth every 6 (six) hours as needed., Disp: 15 tablet, Rfl: 0   Objective:   Vitals:   03/15/24 1114  BP: 116/65  Pulse: 78  SpO2: 97%    Physical Exam Constitutional:      Appearance: Normal appearance.  Cardiovascular:     Rate and Rhythm: Normal rate.     Pulses: Normal pulses.  Pulmonary:     Effort:  Pulmonary effort is normal.  Abdominal:     Palpations: Abdomen is soft.  Musculoskeletal:        General: Swelling and tenderness present.       Arms:  Skin:    General: Skin is warm.     Capillary Refill: Capillary refill takes less than 2 seconds.     Findings: Lesion present.  Neurological:     Mental Status: She is alert and oriented to person, place, and time.  Psychiatric:        Mood and Affect: Mood normal.        Behavior: Behavior normal.        Thought Content: Thought content normal.  Judgment: Judgment normal.     Assessment & Plan:  Intraductal papilloma of right breast  Acute systolic CHF (congestive heart failure), NYHA class 3 (HCC)  Acute on chronic systolic heart failure [facility])  Patient is scheduled for excision with Dr. Cherlynn Cornfield.  I will remain available if needed but I think she is good to be fine with out any reconstruction.  The patient knows she can certainly come to me after she has healed up and I will be happy to take a look.  Pictures were obtained of the patient and placed in the chart with the patient's or guardian's permission.   Lindaann Requena Tyshia Fenter, DO

## 2024-03-20 ENCOUNTER — Other Ambulatory Visit (HOSPITAL_COMMUNITY): Payer: Self-pay | Admitting: Cardiology

## 2024-03-20 DIAGNOSIS — I5022 Chronic systolic (congestive) heart failure: Secondary | ICD-10-CM

## 2024-03-22 ENCOUNTER — Ambulatory Visit (INDEPENDENT_AMBULATORY_CARE_PROVIDER_SITE_OTHER): Admitting: Plastic Surgery

## 2024-03-22 ENCOUNTER — Encounter: Payer: Self-pay | Admitting: Plastic Surgery

## 2024-03-22 DIAGNOSIS — D241 Benign neoplasm of right breast: Secondary | ICD-10-CM

## 2024-03-22 NOTE — Progress Notes (Signed)
 The patient is a 63 year old female joining me by phone.  She is at home and I am at the office.  We spent 5 minutes in discussion.  She is all set to have her surgery with Dr. Cherlynn Cornfield.  I spoke with Dr. Cherlynn Cornfield and she is aware that I am present if needed.  The patient knows this as well and we remain available.  I connected with  Anita Warren on 03/22/24 by phone and verified that I am speaking with the correct person using two identifiers.   I discussed the limitations of evaluation and management by telemedicine. The patient expressed understanding and agreed to proceed.

## 2024-03-28 ENCOUNTER — Telehealth: Payer: Self-pay

## 2024-03-28 NOTE — Telephone Encounter (Signed)
 Sent message back to clearance request sender and requested clarification on the procedure.  In particular on whether or not radiation will be used, if so, need more info on type/location etc.   Should be okay to go ahead with procedure without a sooner OV.  If radiation is involved to to consider damage to device. Otherwise, program defib off and have industry present.

## 2024-03-28 NOTE — Progress Notes (Signed)
 Notified [employer] rep, Justino Ona, of pt's surgery on 03/31/24 at [facility] Short Stay at 0730. Pt will arrive at 0530. Polly Brink was aware of patient. He will schedule rep to be at Short Stay at 0625 on 03/31/24.

## 2024-03-28 NOTE — Progress Notes (Signed)
 Surgical Instructions   Your procedure is scheduled on Thursday June 5. Report to [facility] Main Entrance "A" at 5:30 A.M., then check in with the Admitting office. Any questions or running late day of surgery: call [phone]  Questions prior to your surgery date: call [phone], Monday-Friday, 8am-4pm. If you experience any cold or flu symptoms such as cough, fever, chills, shortness of breath, etc. between now and your scheduled surgery, please notify us  at the above number.     Remember:  Do not eat after midnight the night before your surgery   You may drink clear liquids until 4:30am the morning of your surgery.   Clear liquids allowed are: Water , Non-Citrus Juices (without pulp), Carbonated Beverages, Clear Tea (no milk, honey, etc.), Black Coffee Only (NO MILK, CREAM OR POWDERED CREAMER of any kind), and Gatorade.    Take these medicines the morning of surgery with A SIP OF WATER   carvedilol  (COREG )  hydrALAZINE  (APRESOLINE )  isosorbide  mononitrate (IMDUR )   May take these medicines IF NEEDED: acetaminophen  (TYLENOL )   ACCORDING TO YOUR CARDIOLOGIST INSTRUCTIONS, HOLD ELIQUIS  2 DAYS PRIOR TO SURGERY.  YOUR LAST DOSE SHOULD BE 03/28/24.   HOLD FARXIGA  72 HOURS PRIOR TO SURGERY. YOUR LAST DOSE SHOULD BE 03/27/24.   One week prior to surgery, STOP taking any Aspirin  (unless otherwise instructed by your surgeon) Aleve, Naproxen, Ibuprofen , Motrin , Advil , Goody's, BC's, all herbal medications, fish oil, and non-prescription vitamins.                     Do NOT Smoke (Tobacco/Vaping) for 24 hours prior to your procedure.  If you use a CPAP at night, you may bring your mask/headgear for your overnight stay.   You will be asked to remove any contacts, glasses, piercing's, hearing aid's, dentures/partials prior to surgery. Please bring cases for these items if needed.    Patients discharged the day of surgery will not be allowed to drive home, and someone needs to stay with  them for 24 hours.  SURGICAL WAITING ROOM VISITATION Patients may have no more than 2 support people in the waiting area - these visitors may rotate.   Pre-op nurse will coordinate an appropriate time for 1 ADULT support person, who may not rotate, to accompany patient in pre-op.  Children under the age of 60 must have an adult with them who is not the patient and must remain in the main waiting area with an adult.  If the patient needs to stay at the hospital during part of their recovery, the visitor guidelines for inpatient rooms apply.  Please refer to the [facility] website for the visitor guidelines for any additional information.   If you received a COVID test during your pre-op visit  it is requested that you wear a mask when out in public, stay away from anyone that may not be feeling well and notify your surgeon if you develop symptoms. If you have been in contact with anyone that has tested positive in the last 10 days please notify you surgeon.      Pre-operative CHG Bathing Instructions   You can play a key role in reducing the risk of infection after surgery. Your skin needs to be as free of germs as possible. You can reduce the number of germs on your skin by washing with CHG (chlorhexidine  gluconate) soap before surgery. CHG is an antiseptic soap that kills germs and continues to kill germs even after washing.   DO NOT use  if you have an allergy to chlorhexidine /CHG or antibacterial soaps. If your skin becomes reddened or irritated, stop using the CHG and notify one of our RNs at [phone].              TAKE A SHOWER THE NIGHT BEFORE SURGERY AND THE DAY OF SURGERY    Please keep in mind the following:  DO NOT shave, including legs and underarms, 48 hours prior to surgery.   You may shave your face before/day of surgery.  Place clean sheets on your bed the night before surgery Use a clean washcloth (not used since being washed) for each shower. DO NOT sleep with pet's  night before surgery.  CHG Shower Instructions:  Wash your face and private area with normal soap. If you choose to wash your hair, wash first with your normal shampoo.  After you use shampoo/soap, rinse your hair and body thoroughly to remove shampoo/soap residue.  Turn the water  OFF and apply half the bottle of CHG soap to a CLEAN washcloth.  Apply CHG soap ONLY FROM YOUR NECK DOWN TO YOUR TOES (washing for 3-5 minutes)  DO NOT use CHG soap on face, private areas, open wounds, or sores.  Pay special attention to the area where your surgery is being performed.  If you are having back surgery, having someone wash your back for you may be helpful. Wait 2 minutes after CHG soap is applied, then you may rinse off the CHG soap.  Pat dry with a clean towel  Put on clean pajamas    Additional instructions for the day of surgery: DO NOT APPLY any lotions, deodorants, cologne, or perfumes.   Do not wear jewelry or makeup Do not wear nail polish, gel polish, artificial nails, or any other type of covering on natural nails (fingers and toes) Do not bring valuables to the hospital. [facility] is not responsible for valuables/personal belongings. Put on clean/comfortable clothes.  Please brush your teeth.  Ask your nurse before applying any prescription medications to the skin.

## 2024-03-28 NOTE — Telephone Encounter (Signed)
 Received request today for surgical clearance for procedure on Thursday 03/31/24.   Planned Procedure:  EXCISION, MASS, BREAST, USING RADIOLOGICAL MARKER  (left breast seed placement) - Left   LEFT BREAST SEED LOCALIZED EXCISIONAL BIOPSY  EXCISION OF LEFT AXILLARY MASS    Patient has an ICD, she is not dependent.  Last OV with Dr. Arlester Ladd: 04/10/2023.   Should patient be seen in office before surgery? What considerations with "seed implant" do we need to make?  There is no discussion of radiation type/location / how close the seeds will be to the device.  Do we need more clarification from surgeon?   Would in the least plan on turning device off, having industry present.    Dr. Arlester Ladd please advise.

## 2024-03-28 NOTE — Telephone Encounter (Signed)
 Please see note from EP scheduler.

## 2024-03-29 ENCOUNTER — Ambulatory Visit
Admission: RE | Admit: 2024-03-29 | Discharge: 2024-03-29 | Disposition: A | Discharge status: Home / Self Care | Source: Ambulatory Visit | Attending: General Surgery | Admitting: General Surgery

## 2024-03-29 ENCOUNTER — Other Ambulatory Visit: Payer: Self-pay | Admitting: General Surgery

## 2024-03-29 ENCOUNTER — Other Ambulatory Visit: Payer: Self-pay

## 2024-03-29 ENCOUNTER — Encounter (HOSPITAL_COMMUNITY): Payer: Self-pay

## 2024-03-29 ENCOUNTER — Encounter (HOSPITAL_COMMUNITY)
Admission: RE | Admit: 2024-03-29 | Discharge: 2024-03-29 | Disposition: A | Discharge status: Home / Self Care | Source: Ambulatory Visit | Attending: General Surgery | Admitting: General Surgery

## 2024-03-29 VITALS — BP 96/69 | HR 77 | Temp 98.3°F | Resp 18 | Ht 64.0 in | Wt 175.8 lb

## 2024-03-29 DIAGNOSIS — D242 Benign neoplasm of left breast: Secondary | ICD-10-CM

## 2024-03-29 DIAGNOSIS — Z01812 Encounter for preprocedural laboratory examination: Secondary | ICD-10-CM | POA: Insufficient documentation

## 2024-03-29 DIAGNOSIS — Z01818 Encounter for other preprocedural examination: Secondary | ICD-10-CM

## 2024-03-29 DIAGNOSIS — I5022 Chronic systolic (congestive) heart failure: Secondary | ICD-10-CM | POA: Insufficient documentation

## 2024-03-29 HISTORY — PX: BREAST BIOPSY: SHX20

## 2024-03-29 LAB — CBC
HCT: 34 % — ABNORMAL LOW (ref 36.0–46.0)
Hemoglobin: 10.9 g/dL — ABNORMAL LOW (ref 12.0–15.0)
MCH: 29.5 pg (ref 26.0–34.0)
MCHC: 32.1 g/dL (ref 30.0–36.0)
MCV: 91.9 fL (ref 80.0–100.0)
Platelets: 194 10*3/uL (ref 150–400)
RBC: 3.7 MIL/uL — ABNORMAL LOW (ref 3.87–5.11)
RDW: 16.2 % — ABNORMAL HIGH (ref 11.5–15.5)
WBC: 5.4 10*3/uL (ref 4.0–10.5)
nRBC: 0 % (ref 0.0–0.2)

## 2024-03-29 LAB — TYPE AND SCREEN
ABO/RH(D): B POS
Antibody Screen: NEGATIVE

## 2024-03-29 LAB — BASIC METABOLIC PANEL WITH GFR
Anion gap: 8 (ref 5–15)
BUN: 21 mg/dL (ref 8–23)
CO2: 24 mmol/L (ref 22–32)
Calcium: 9.5 mg/dL (ref 8.9–10.3)
Chloride: 104 mmol/L (ref 98–111)
Creatinine, Ser: 1.52 mg/dL — ABNORMAL HIGH (ref 0.44–1.00)
GFR, Estimated: 39 mL/min — ABNORMAL LOW (ref >60)
Glucose, Bld: 114 mg/dL — ABNORMAL HIGH (ref 70–99)
Potassium: 3.9 mmol/L (ref 3.5–5.1)
Sodium: 136 mmol/L (ref 135–145)

## 2024-03-29 NOTE — Progress Notes (Signed)
 PCP - Salena Craven Fusco,MD Cardiologist - Dalton McLean,MD EP - [place]   PPM/ICD - St. Jude?[employer] ICD Device Orders - yes - 03/28/24 Rep Notified - Justino Ona notified 03/28/24- see progress note  Chest x-ray - na EKG - 01/06/24 Stress Test - denies ECHO - 06/11/23 Cardiac Cath - 01/28/11  Sleep Study - denies CPAP -   Fasting Blood Sugar - na Checks Blood Sugar _____ times a day  Last dose of GLP1 agonist-  na GLP1 instructions:   Blood Thinner Instructions: Hold Eliquis  2 days prior to surgery. Last dose was 03/28/24 at 8pm.  Aspirin  Instructions:na  Hold Farxiga  72 hours prior to surgery. Pt's last dose was taken 03/29/24 am.  ERAS Protcol -clear liquids until 0430 PRE-SURGERY Ensure or G2- no  COVID TEST- na   Anesthesia review: yes- pt has ICD  Patient denies shortness of breath, fever, cough and chest pain at PAT appointment   All instructions explained to the patient, with a verbal understanding of the material. Patient agrees to go over the instructions while at home for a better understanding. . The opportunity to ask questions was provided.

## 2024-03-30 ENCOUNTER — Encounter: Payer: Self-pay | Admitting: Cardiovascular Disease

## 2024-03-30 DIAGNOSIS — C50912 Malignant neoplasm of unspecified site of left female breast: Secondary | ICD-10-CM | POA: Diagnosis not present

## 2024-03-30 NOTE — Progress Notes (Signed)
 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  Most recent echo 03/2023 showed EF 35% with diffuse hypokinesis, mildly decreased RV systolic function.  Last seen in follow-up by Vernia Good, FNP on 01/06/2024 and reported feeling well at that time, no shortness of breath with activity, good energy levels.  She was continued on Lasix  Lasix  20 mg daily + 10 KCL daily, Farxiga  10 mg daily, spironolactone  50 mg daily, Coreg  25 mg bid, hydralazine  100 mg tid, Imdur  60 mg bid.  Clearance per telephone encounter by Slater Duncan, NP on 03/01/2024, "Chart reviewed as part of pre-operative protocol coverage. Given past medical history and time since last visit, based on ACC/AHA guidelines, Anita Warren would be at acceptable risk for the planned procedure without further cardiovascular testing. Patient was advised that if she develops new symptoms prior to surgery to contact our office to arrange a follow-up appointment. She verbalized understanding. Per office protocol, she may hold Eliquis  for 2 days prior to procedure and should resume post operatively when hemodynamically stable."  With respect to ICD, EP clinic note 6-25 states, "Should be okay to go ahead with procedure without a sooner OV.  If radiation is involved to to consider damage to device. Otherwise, program defib off and have industry present."  Device rep notified and will be present.  Patient reports last dose Eliquis  03/28/2024.  EKG 01/06/2024: Normal sinus rhythm.  Rate 83. Minimal voltage criteria for LVH, may be normal variant ( R in aVL ), Cannot rule out Anterior infarct (cited on or before 22-May-2020).  TTE  04/23/2023: 1. Left ventricular ejection fraction, by estimation, is 35%. The left  ventricle has moderately decreased function. The left ventricle  demonstrates global hypokinesis. The left ventricular internal cavity size  was mildly dilated. Left ventricular  diastolic parameters are consistent with Grade I diastolic dysfunction  (impaired relaxation). The average left ventricular global longitudinal  strain is 11.3 %. The global longitudinal strain is abnormal.   2. Right ventricular systolic function is mildly reduced. The right  ventricular size is normal. Tricuspid regurgitation signal is inadequate  for assessing PA pressure.   3. A small pericardial effusion is present. The pericardial effusion is  circumferential.   4. The mitral valve is normal in structure. Trivial mitral valve  regurgitation. No evidence of mitral stenosis.   5. The aortic valve is tricuspid. Aortic valve regurgitation is trivial.  No aortic stenosis is present.   6. The inferior vena cava is normal in size with greater than 50%  respiratory variability, suggesting right atrial pressure of 3 mmHg.     Anita Warren [facility] Short Stay Center/Anesthesiology Phone [phone] 03/30/2024 9:38 AM

## 2024-03-30 NOTE — Telephone Encounter (Signed)
 LM with Breast center to clarify the following for clearance for tomorrow: Breast Center did confirm the seeds are radioactive.   The seeds are radioactive.  How far are they from device, <10cm? What is the strength of radioactive material that will be used?   Nurse at [facility] is investigating and will call back. (Device clinic number given).

## 2024-03-30 NOTE — Anesthesia Preprocedure Evaluation (Addendum)
 Anesthesia Evaluation  Patient identified by MRN, date of birth, ID band Patient awake    Reviewed: Allergy & Precautions, NPO status , Patient's Chart, lab work & pertinent test results, reviewed documented beta blocker date and time   History of Anesthesia Complications Negative for: history of anesthetic complications  Airway Mallampati: II  TM Distance: >3 FB Neck ROM: Full    Dental  (+) Missing,    Pulmonary neg pulmonary ROS   Pulmonary exam normal        Cardiovascular hypertension, Pt. on medications and Pt. on home beta blockers +CHF  Normal cardiovascular exam+ dysrhythmias Atrial Fibrillation + Cardiac Defibrillator   TTE 04/23/23: EF 35%, global hypokinesis, mild LVE, grade I DD, mild RV dysfunction, small pericardial effusion     Neuro/Psych  Headaches    GI/Hepatic negative GI ROS, Neg liver ROS,,,  Endo/Other  negative endocrine ROS    Renal/GU Renal InsufficiencyRenal disease (Cr  1.52)     Musculoskeletal negative musculoskeletal ROS (+)    Abdominal   Peds  Hematology  (+) Blood dyscrasia (Hgb 10.9), anemia   Anesthesia Other Findings  LEFT BREAST PAPILLOMA, LEFT AXILLARY MASS    Reproductive/Obstetrics                              Anesthesia Physical Anesthesia Plan  ASA: 3  Anesthesia Plan: General   Post-op Pain Management: Tylenol  PO (pre-op)*   Induction: Intravenous  PONV Risk Score and Plan: 3 and Treatment may vary due to age or medical condition, Ondansetron , Dexamethasone  and Midazolam   Airway Management Planned: LMA  Additional Equipment: None  Intra-op Plan:   Post-operative Plan: Extubation in OR  Informed Consent: I have reviewed the patients History and Physical, chart, labs and discussed the procedure including the risks, benefits and alternatives for the proposed anesthesia with the patient or authorized representative who has indicated  his/her understanding and acceptance.     Dental advisory given  Plan Discussed with: CRNA  Anesthesia Plan Comments: (PAT note by Rudy Costain, PA-C: 63 year old female follows in the advanced heart failure clinic for history of viral nonischemic cardiomyopathy, severe systolic function with EF 15% s/p ICD in the setting of VT 02/2011 [facility] Jude, s/p generator change out 11/2022), cath 2012 with no CAD, NSVT, paroxysmal a flutter on Eliquis , angioedema (no longer on ACEI, ARNI, or ARB).  Most recent echo 03/2023 showed EF 35% with diffuse hypokinesis, mildly decreased RV systolic function.  Last seen in follow-up by Vernia Good, FNP on 01/06/2024 and reported feeling well at that time, no shortness of breath with activity, good energy levels.  She was continued on Lasix  Lasix  20 mg daily + 10 KCL daily, Farxiga  10 mg daily, spironolactone  50 mg daily, Coreg  25 mg bid, hydralazine  100 mg tid, Imdur  60 mg bid.  Clearance per telephone encounter by Slater Duncan, NP on 03/01/2024, "Chart reviewed as part of pre-operative protocol coverage. Given past medical history and time since last visit, based on ACC/AHA guidelines, Anita Warren would be at acceptable risk for the planned procedure without further cardiovascular testing. Patient was advised that if she develops new symptoms prior to surgery to contact our office to arrange a follow-up appointment. She verbalized understanding. Per office protocol, she may hold Eliquis  for 2 days prior to procedure and should resume post operatively when hemodynamically stable."  With respect to ICD, EP clinic note 6-25 states, "Should be okay to go ahead  with procedure without a sooner OV.  If radiation is involved to to consider damage to device. Otherwise, program defib off and have industry present."  Device rep notified and will be present.  Patient reports last dose Eliquis  03/28/2024.  EKG 01/06/2024: Normal sinus rhythm.  Rate 83. Minimal voltage  criteria for LVH, may be normal variant ( R in aVL ), Cannot rule out Anterior infarct (cited on or before 22-May-2020).  TTE 04/23/2023: 1. Left ventricular ejection fraction, by estimation, is 35%. The left  ventricle has moderately decreased function. The left ventricle  demonstrates global hypokinesis. The left ventricular internal cavity size  was mildly dilated. Left ventricular  diastolic parameters are consistent with Grade I diastolic dysfunction  (impaired relaxation). The average left ventricular global longitudinal  strain is 11.3 %. The global longitudinal strain is abnormal.   2. Right ventricular systolic function is mildly reduced. The right  ventricular size is normal. Tricuspid regurgitation signal is inadequate  for assessing PA pressure.   3. A small pericardial effusion is present. The pericardial effusion is  circumferential.   4. The mitral valve is normal in structure. Trivial mitral valve  regurgitation. No evidence of mitral stenosis.   5. The aortic valve is tricuspid. Aortic valve regurgitation is trivial.  No aortic stenosis is present.   6. The inferior vena cava is normal in size with greater than 50%  respiratory variability, suggesting right atrial pressure of 3 mmHg.   )         Anesthesia Quick Evaluation

## 2024-03-30 NOTE — Progress Notes (Signed)
 PERIOPERATIVE PRESCRIPTION FOR IMPLANTED CARDIAC DEVICE PROGRAMMING  Patient Information: Name:  Anita Warren  DOB:  Jan 18, 1961  MRN:  [id number]  Device Information: Planned Procedure:  EXCISION, MASS, BREAST, USING RADIOLOGICAL MARKER - Left  EXCISION MASS UPPER EXTREMITIES - Left   Surgeon:  Jearldine Mina   Date of Procedure:  03/31/2024  Cautery will be used.  Position during surgery:  supine   Please send documentation back to:  Arlin Benes (Fax # [phone])    Clinic EP Physician:  Marlane Silver, MD   Device Type:  Defibrillator Manufacturer and Phone #:  [employer]: [phone] Pacemaker Dependent?:  No. Date of Last Device Check:  03/17/24 remote check/ 04/10/23 in office Normal Device Function?:  Yes.    Electrophysiologist's Recommendations:  Have magnet available. Provide continuous ECG monitoring when magnet is used or reprogramming is to be performed.  Procedure will likely interfere with device function.  Device should be programmed:  Tachy therapies disabled Device Rep for [employer] has requested to be present for the procedure.  Geraldean Klein ([employer]):  [phone].  Per Device Clinic Standing Orders, Candace Cerise, RN  10:51 AM 03/30/2024

## 2024-03-30 NOTE — Telephone Encounter (Signed)
 Confirmed with Tonita Frater with [facility] - per their oncology/radiation specialist the seeds (regardless of location) will not cause any damage for device for this patient's procedure tomorrow.    Will move forward with clearance.

## 2024-03-30 NOTE — Progress Notes (Signed)
 Pt made aware of surgery time change for 03/31/24 0800-1030, arrival 0600, stop drinking clear liquids by 0500, and to follow all previous instructions given.

## 2024-03-31 ENCOUNTER — Ambulatory Visit (HOSPITAL_COMMUNITY)
Admission: RE | Admit: 2024-03-31 | Discharge: 2024-03-31 | Disposition: A | Discharge status: Home / Self Care | Attending: General Surgery | Admitting: General Surgery

## 2024-03-31 ENCOUNTER — Ambulatory Visit (HOSPITAL_BASED_OUTPATIENT_CLINIC_OR_DEPARTMENT_OTHER): Admitting: Physician Assistant

## 2024-03-31 ENCOUNTER — Encounter (HOSPITAL_COMMUNITY)
Admission: RE | Disposition: A | Discharge status: Home / Self Care | Payer: Self-pay | Source: Home / Self Care | Attending: General Surgery

## 2024-03-31 ENCOUNTER — Other Ambulatory Visit: Payer: Self-pay

## 2024-03-31 ENCOUNTER — Ambulatory Visit (HOSPITAL_COMMUNITY): Admitting: Physician Assistant

## 2024-03-31 ENCOUNTER — Ambulatory Visit
Admission: RE | Admit: 2024-03-31 | Discharge: 2024-03-31 | Disposition: A | Discharge status: Home / Self Care | Source: Ambulatory Visit | Attending: General Surgery | Admitting: General Surgery

## 2024-03-31 ENCOUNTER — Encounter (HOSPITAL_COMMUNITY): Payer: Self-pay | Admitting: General Surgery

## 2024-03-31 DIAGNOSIS — I130 Hypertensive heart and chronic kidney disease with heart failure and stage 1 through stage 4 chronic kidney disease, or unspecified chronic kidney disease: Secondary | ICD-10-CM

## 2024-03-31 DIAGNOSIS — Z7901 Long term (current) use of anticoagulants: Secondary | ICD-10-CM | POA: Insufficient documentation

## 2024-03-31 DIAGNOSIS — D242 Benign neoplasm of left breast: Secondary | ICD-10-CM

## 2024-03-31 DIAGNOSIS — N6082 Other benign mammary dysplasias of left breast: Secondary | ICD-10-CM | POA: Insufficient documentation

## 2024-03-31 DIAGNOSIS — Z803 Family history of malignant neoplasm of breast: Secondary | ICD-10-CM | POA: Diagnosis not present

## 2024-03-31 DIAGNOSIS — I5023 Acute on chronic systolic (congestive) heart failure: Secondary | ICD-10-CM | POA: Diagnosis not present

## 2024-03-31 DIAGNOSIS — I110 Hypertensive heart disease with heart failure: Secondary | ICD-10-CM | POA: Insufficient documentation

## 2024-03-31 DIAGNOSIS — D1739 Benign lipomatous neoplasm of skin and subcutaneous tissue of other sites: Secondary | ICD-10-CM | POA: Diagnosis not present

## 2024-03-31 DIAGNOSIS — I509 Heart failure, unspecified: Secondary | ICD-10-CM | POA: Diagnosis not present

## 2024-03-31 DIAGNOSIS — M7989 Other specified soft tissue disorders: Secondary | ICD-10-CM | POA: Diagnosis not present

## 2024-03-31 DIAGNOSIS — I471 Supraventricular tachycardia, unspecified: Secondary | ICD-10-CM | POA: Insufficient documentation

## 2024-03-31 DIAGNOSIS — G8918 Other acute postprocedural pain: Secondary | ICD-10-CM | POA: Diagnosis not present

## 2024-03-31 DIAGNOSIS — R2232 Localized swelling, mass and lump, left upper limb: Secondary | ICD-10-CM | POA: Diagnosis not present

## 2024-03-31 DIAGNOSIS — Z79899 Other long term (current) drug therapy: Secondary | ICD-10-CM | POA: Diagnosis not present

## 2024-03-31 DIAGNOSIS — D1779 Benign lipomatous neoplasm of other sites: Secondary | ICD-10-CM | POA: Diagnosis not present

## 2024-03-31 DIAGNOSIS — N6012 Diffuse cystic mastopathy of left breast: Secondary | ICD-10-CM | POA: Diagnosis not present

## 2024-03-31 DIAGNOSIS — N183 Chronic kidney disease, stage 3 unspecified: Secondary | ICD-10-CM

## 2024-03-31 DIAGNOSIS — D1722 Benign lipomatous neoplasm of skin and subcutaneous tissue of left arm: Secondary | ICD-10-CM | POA: Diagnosis not present

## 2024-03-31 HISTORY — PX: EXCISION OF NERVE MASS, AXILLA: SHX7409

## 2024-03-31 HISTORY — PX: RADIOACTIVE SEED GUIDED AXILLARY SENTINEL LYMPH NODE: SHX6735

## 2024-03-31 HISTORY — PX: RADIOACTIVE SEED GUIDED EXCISIONAL BREAST BIOPSY: SHX6490

## 2024-03-31 SURGERY — RADIOACTIVE SEED GUIDED AXILLARY SENTINEL LYMPH NODE BIOPSY
Anesthesia: General | Site: Breast | Laterality: Left

## 2024-03-31 MED ORDER — SODIUM CHLORIDE (PF) 0.9 % IJ SOLN
INTRAMUSCULAR | Status: AC
  Filled 2024-03-31: qty 20

## 2024-03-31 MED ORDER — ROCURONIUM BROMIDE 10 MG/ML (PF) SYRINGE
PREFILLED_SYRINGE | INTRAVENOUS | Status: AC
  Filled 2024-03-31: qty 10

## 2024-03-31 MED ORDER — BUPIVACAINE-EPINEPHRINE (PF) 0.25% -1:200000 IJ SOLN
INTRAMUSCULAR | Status: AC
Start: 2024-03-31 — End: ?
  Filled 2024-03-31: qty 30

## 2024-03-31 MED ORDER — CEFAZOLIN SODIUM-DEXTROSE 2-4 GM/100ML-% IV SOLN
2.0000 g | INTRAVENOUS | Status: AC
  Administered 2024-03-31: 2 g via INTRAVENOUS
  Filled 2024-03-31: qty 100

## 2024-03-31 MED ORDER — EPHEDRINE SULFATE-NACL 50-0.9 MG/10ML-% IV SOSY
PREFILLED_SYRINGE | INTRAVENOUS | Status: DC | PRN
  Administered 2024-03-31 (×6): 5 mg via INTRAVENOUS

## 2024-03-31 MED ORDER — DEXAMETHASONE SODIUM PHOSPHATE 10 MG/ML IJ SOLN
INTRAMUSCULAR | Status: DC | PRN
  Administered 2024-03-31: 8 mg via INTRAVENOUS

## 2024-03-31 MED ORDER — PHENYLEPHRINE 80 MCG/ML (10ML) SYRINGE FOR IV PUSH (FOR BLOOD PRESSURE SUPPORT)
PREFILLED_SYRINGE | INTRAVENOUS | Status: AC
  Filled 2024-03-31: qty 10

## 2024-03-31 MED ORDER — PHENYLEPHRINE 80 MCG/ML (10ML) SYRINGE FOR IV PUSH (FOR BLOOD PRESSURE SUPPORT)
PREFILLED_SYRINGE | INTRAVENOUS | Status: DC | PRN
  Administered 2024-03-31 (×3): 80 ug via INTRAVENOUS

## 2024-03-31 MED ORDER — 0.9 % SODIUM CHLORIDE (POUR BTL) OPTIME
TOPICAL | Status: DC | PRN
  Administered 2024-03-31: 1000 mL

## 2024-03-31 MED ORDER — EPHEDRINE 5 MG/ML INJ
INTRAVENOUS | Status: AC
  Filled 2024-03-31: qty 5

## 2024-03-31 MED ORDER — FENTANYL CITRATE (PF) 100 MCG/2ML IJ SOLN
25.0000 ug | INTRAMUSCULAR | Status: DC | PRN
  Administered 2024-03-31 (×2): 25 ug via INTRAVENOUS

## 2024-03-31 MED ORDER — ACETAMINOPHEN 500 MG PO TABS
1000.0000 mg | ORAL_TABLET | Freq: Once | ORAL | Status: AC
  Administered 2024-03-31: 1000 mg via ORAL
  Filled 2024-03-31: qty 2

## 2024-03-31 MED ORDER — LIDOCAINE HCL (PF) 1 % IJ SOLN
INTRAMUSCULAR | Status: AC
  Filled 2024-03-31: qty 30

## 2024-03-31 MED ORDER — ONDANSETRON HCL 4 MG/2ML IJ SOLN
INTRAMUSCULAR | Status: AC
  Filled 2024-03-31: qty 2

## 2024-03-31 MED ORDER — OXYCODONE HCL 5 MG/5ML PO SOLN: 5.0000 mg | Freq: Once | ORAL | Status: AC | PRN

## 2024-03-31 MED ORDER — CHLORHEXIDINE GLUCONATE CLOTH 2 % EX PADS: 6.0000 | MEDICATED_PAD | Freq: Once | CUTANEOUS | Status: DC

## 2024-03-31 MED ORDER — METHOCARBAMOL 500 MG PO TABS: 750.0000 mg | ORAL_TABLET | Freq: Three times a day (TID) | ORAL | 1 refills | Status: DC | PRN

## 2024-03-31 MED ORDER — LACTATED RINGERS IV SOLN: INTRAVENOUS | Status: DC

## 2024-03-31 MED ORDER — ONDANSETRON HCL 4 MG/2ML IJ SOLN
INTRAMUSCULAR | Status: DC | PRN
  Administered 2024-03-31: 4 mg via INTRAVENOUS

## 2024-03-31 MED ORDER — ACETAMINOPHEN 500 MG PO TABS: 1000.0000 mg | ORAL_TABLET | ORAL | Status: DC

## 2024-03-31 MED ORDER — BUPIVACAINE LIPOSOME 1.3 % IJ SUSP
INTRAMUSCULAR | Status: DC | PRN
  Administered 2024-03-31: 10 mL via PERINEURAL

## 2024-03-31 MED ORDER — PROPOFOL 10 MG/ML IV BOLUS
INTRAVENOUS | Status: AC
  Filled 2024-03-31: qty 20

## 2024-03-31 MED ORDER — FENTANYL CITRATE (PF) 250 MCG/5ML IJ SOLN
INTRAMUSCULAR | Status: DC | PRN
Start: 2024-03-31 — End: 2024-03-31
  Administered 2024-03-31: 25 ug via INTRAVENOUS
  Administered 2024-03-31: 50 ug via INTRAVENOUS

## 2024-03-31 MED ORDER — OXYCODONE HCL 5 MG PO TABS
ORAL_TABLET | ORAL | Status: AC
  Filled 2024-03-31: qty 1

## 2024-03-31 MED ORDER — DEXAMETHASONE SODIUM PHOSPHATE 10 MG/ML IJ SOLN
INTRAMUSCULAR | Status: AC
Start: 2024-03-31 — End: ?
  Filled 2024-03-31: qty 1

## 2024-03-31 MED ORDER — FENTANYL CITRATE (PF) 100 MCG/2ML IJ SOLN
INTRAMUSCULAR | Status: AC
  Filled 2024-03-31: qty 2

## 2024-03-31 MED ORDER — CHLORHEXIDINE GLUCONATE 0.12 % MT SOLN
15.0000 mL | Freq: Once | OROMUCOSAL | Status: AC
  Administered 2024-03-31: 15 mL via OROMUCOSAL
  Filled 2024-03-31: qty 15

## 2024-03-31 MED ORDER — MIDAZOLAM HCL 2 MG/2ML IJ SOLN
INTRAMUSCULAR | Status: DC | PRN
  Administered 2024-03-31: 2 mg via INTRAVENOUS

## 2024-03-31 MED ORDER — LIDOCAINE 2% (20 MG/ML) 5 ML SYRINGE
INTRAMUSCULAR | Status: DC | PRN
  Administered 2024-03-31: 100 mg via INTRAVENOUS

## 2024-03-31 MED ORDER — PROPOFOL 10 MG/ML IV BOLUS
INTRAVENOUS | Status: DC | PRN
  Administered 2024-03-31: 110 mg via INTRAVENOUS
  Administered 2024-03-31: 40 mg via INTRAVENOUS
  Administered 2024-03-31: 30 mg via INTRAVENOUS

## 2024-03-31 MED ORDER — ORAL CARE MOUTH RINSE: 15.0000 mL | Freq: Once | OROMUCOSAL | Status: AC

## 2024-03-31 MED ORDER — MIDAZOLAM HCL 2 MG/2ML IJ SOLN
INTRAMUSCULAR | Status: AC
Start: 2024-03-31 — End: ?
  Filled 2024-03-31: qty 2

## 2024-03-31 MED ORDER — OXYCODONE HCL 5 MG PO TABS
5.0000 mg | ORAL_TABLET | Freq: Once | ORAL | Status: AC | PRN
  Administered 2024-03-31: 5 mg via ORAL

## 2024-03-31 MED ORDER — BUPIVACAINE-EPINEPHRINE (PF) 0.5% -1:200000 IJ SOLN
INTRAMUSCULAR | Status: DC | PRN
Start: 2024-03-31 — End: 2024-03-31
  Administered 2024-03-31: 20 mL via PERINEURAL

## 2024-03-31 MED ORDER — LIDOCAINE 2% (20 MG/ML) 5 ML SYRINGE
INTRAMUSCULAR | Status: AC
  Filled 2024-03-31: qty 5

## 2024-03-31 MED ORDER — OXYCODONE HCL 5 MG PO TABS: 5.0000 mg | ORAL_TABLET | Freq: Four times a day (QID) | ORAL | 0 refills | Status: DC | PRN

## 2024-03-31 MED ORDER — PHENYLEPHRINE HCL-NACL 20-0.9 MG/250ML-% IV SOLN
INTRAVENOUS | Status: DC | PRN
  Administered 2024-03-31: 30 ug/min via INTRAVENOUS

## 2024-03-31 MED ORDER — DROPERIDOL 2.5 MG/ML IJ SOLN: 0.6250 mg | Freq: Once | INTRAMUSCULAR | Status: DC | PRN

## 2024-03-31 MED ORDER — FENTANYL CITRATE (PF) 250 MCG/5ML IJ SOLN
INTRAMUSCULAR | Status: AC
  Filled 2024-03-31: qty 5

## 2024-03-31 SURGICAL SUPPLY — 42 items
BAG COUNTER SPONGE SURGICOUNT (BAG) ×3 IMPLANT
BINDER BREAST LRG (GAUZE/BANDAGES/DRESSINGS) IMPLANT
BINDER BREAST XLRG (GAUZE/BANDAGES/DRESSINGS) IMPLANT
CANISTER SUCTION 3000ML PPV (SUCTIONS) IMPLANT
CHLORAPREP W/TINT 26 (MISCELLANEOUS) ×3 IMPLANT
CLIP TI LARGE 6 (CLIP) ×4 IMPLANT
CLIP TI MEDIUM 24 (CLIP) ×1 IMPLANT
COVER PROBE W GEL 5X96 (DRAPES) ×3 IMPLANT
COVER SURGICAL LIGHT HANDLE (MISCELLANEOUS) ×3 IMPLANT
DERMABOND ADVANCED .7 DNX12 (GAUZE/BANDAGES/DRESSINGS) ×3 IMPLANT
DEVICE DUBIN SPECIMEN MAMMOGRA (MISCELLANEOUS) ×3 IMPLANT
DRAPE CHEST BREAST 15X10 FENES (DRAPES) ×3 IMPLANT
ELECT COATED BLADE 2.86 ST (ELECTRODE) ×3 IMPLANT
ELECTRODE BLDE 4.0 EZ CLN MEGD (MISCELLANEOUS) ×1 IMPLANT
ELECTRODE REM PT RTRN 9FT ADLT (ELECTROSURGICAL) ×3 IMPLANT
GAUZE PAD ABD 7.5X8 STRL (GAUZE/BANDAGES/DRESSINGS) ×1 IMPLANT
GAUZE PAD ABD 8X10 STRL (GAUZE/BANDAGES/DRESSINGS) ×3 IMPLANT
GAUZE SPONGE 4X4 12PLY STRL (GAUZE/BANDAGES/DRESSINGS) ×1 IMPLANT
GAUZE SPONGE 4X4 12PLY STRL LF (GAUZE/BANDAGES/DRESSINGS) ×3 IMPLANT
GLOVE BIO SURGEON STRL SZ 6 (GLOVE) ×3 IMPLANT
GLOVE INDICATOR 6.5 STRL GRN (GLOVE) ×3 IMPLANT
GOWN STRL REUS W/ TWL LRG LVL3 (GOWN DISPOSABLE) ×3 IMPLANT
GOWN STRL REUS W/ TWL XL LVL3 (GOWN DISPOSABLE) ×3 IMPLANT
HEMOSTAT HEMOBLAST BELLOWS (HEMOSTASIS) ×1 IMPLANT
KIT BASIN OR (CUSTOM PROCEDURE TRAY) ×3 IMPLANT
KIT MARKER MARGIN INK (KITS) ×3 IMPLANT
LIGHT WAVEGUIDE WIDE FLAT (MISCELLANEOUS) IMPLANT
NDL HYPO 25GX1X1/2 BEV (NEEDLE) ×2 IMPLANT
NEEDLE HYPO 25GX1X1/2 BEV (NEEDLE) ×3 IMPLANT
NS IRRIG 1000ML POUR BTL (IV SOLUTION) IMPLANT
PACK GENERAL/GYN (CUSTOM PROCEDURE TRAY) ×3 IMPLANT
PACK UNIVERSAL I (CUSTOM PROCEDURE TRAY) ×1 IMPLANT
SPONGE T-LAP 18X18 ~~LOC~~+RFID (SPONGE) ×1 IMPLANT
STOCKINETTE IMPERVIOUS 9X36 MD (GAUZE/BANDAGES/DRESSINGS) ×1 IMPLANT
STRIP CLOSURE SKIN 1/2X4 (GAUZE/BANDAGES/DRESSINGS) ×3 IMPLANT
SUT MNCRL AB 4-0 PS2 18 (SUTURE) ×3 IMPLANT
SUT SILK 2 0 SH (SUTURE) IMPLANT
SUT VIC AB 2-0 SH 27XBRD (SUTURE) IMPLANT
SUT VIC AB 3-0 SH 27X BRD (SUTURE) ×5 IMPLANT
SYR CONTROL 10ML LL (SYRINGE) ×3 IMPLANT
TOWEL GREEN STERILE (TOWEL DISPOSABLE) ×3 IMPLANT
TOWEL GREEN STERILE FF (TOWEL DISPOSABLE) ×3 IMPLANT

## 2024-03-31 NOTE — Transfer of Care (Signed)
 Immediate Anesthesia Transfer of Care Note  Patient: Anita Warren  Procedure(s) Performed: EXCISION, MASS, BREAST, USING RADIOLOGICAL MARKER (Left: Breast) EXCISION OF AXILLARY MASS  Patient Location: PACU  Anesthesia Type:General  Level of Consciousness: awake, alert , and patient cooperative  Airway & Oxygen Therapy: Patient Spontanous Breathing  Post-op Assessment: Report given to RN and Post -op Vital signs reviewed and stable  Post vital signs: Reviewed and stable  Last Vitals:  Vitals Value Taken Time  BP 121/79 03/31/24 1215  Temp    Pulse 57 03/31/24 1216  Resp 13 03/31/24 1216  SpO2 96 % 03/31/24 1216  Vitals shown include unfiled device data.  Last Pain:  Vitals:   03/31/24 1610  TempSrc:   PainSc: 0-No pain         Complications: No notable events documented.

## 2024-03-31 NOTE — H&P (Signed)
 REFERRING PHYSICIAN: Larrie Po  PROVIDER: Eppie Hasting, MD  Care Team: Patient Care Team: Kim Pen, MD as PCP - General (Internal Medicine) Eppie Hasting, MD as Consulting Provider (Surgical Oncology) Darlis Eisenmenger, MD (Cardiovascular Disease) Beau Bound, MD (General Surgery)   MRN: [id number] DOB: 1960/12/10  Subjective   Chief Complaint: Left breast liposarcoma  History of Present Illness: Anita Warren is a 63 y.o. female who is seen today as an office consultation at the request of Dr. 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 She has seen Dr. Larrie Po in [place] for multiple breast excisions. However, she also had a enlarging left axillary mass that was presumed to be a lipoma. However it appeared to be a bit firmer and Dr. Larrie Po felt like given the size of this mass it would be best managed in [facility] where her cardiology team is.   The axillary mass, present since 2018, has increased in size and firmness, necessitating further investigation. A CT scan was performed, which could not rule out sarcoma, prompting the need for surgical evaluation.  She has presumed viral cardiomyopathy with an ejection fraction 1 point as low as 5%, requiring a defibrillator since 2012. She experienced shortness of breath, supraventricular tachycardia, severe fatigue, and weakness, which necessitated frequent hospital visits and breathing treatments. 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   Diagnostic mammogram:12/10/23 ACR Breast Density Category b: There are scattered areas of fibroglandular density. FINDINGS: Additional tomograms were performed of the left  breast. There is an oval circumscribed mass in the upper central left breast measuring 0.5 cm. Targeted ultrasound of the left breast was performed. There is an oval hypoechoic mass with slight margin irregularity in the left breast at 12 o'clock 7 cm from nipple measuring 0.5 x 0.5 x 0.4 cm. This corresponds well with the mass seen in the upper central left breast at mammography. Evaluation of the left axilla was limited due to the presence of an extremely large lipoma (present since at least 2015). IMPRESSION: 1. Indeterminate 0.5 cm mass in the left breast at the 12 o'clock position. 2. Unable to evaluate left axilla due to presence of an extremely large lipoma. RECOMMENDATION: Recommend ultrasound-guided core biopsy of the mass in the left breast at the 12 o'clock position. I have discussed the findings and recommendations with the patient. If applicable, a reminder letter will be sent to the patient regarding the next appointment. BI-RADS CATEGORY 4: Suspicious.   02/08/24 Ct chest IMPRESSION: 1. Circumscribed 10.6 x 9.1 x 8.9 cm intramuscular left axillary mass, predominantly fat density although with nodular 0.9 cm soft tissue focus anteriorly and separate plaque-like calcific focus anteriorly, increased from 6.4 x 5.8 x 6.9 cm on 01/23/2015 chest CT. Low-grade liposarcoma cannot be excluded. Suggest surgical consultation. 2. Stable mild cardiomegaly. Small pericardial effusion, increased from prior chest CT. 3. Indeterminate hypodense 2.4 cm peripheral right liver lesion, not substantially changed from 01/23/2015 chest CT, presumably benign. 4. Aortic Atherosclerosis (ICD10-I70.0).   Pathology core needle biopsy: 12/22/23 A. BREAST, LEFT, 12 OC, BIOPSY:  Intraductal papilloma.  Negative for malignancy.   Review of Systems: A complete review of systems was obtained from the patient. I  have reviewed this information and discussed as appropriate with the patient. See  HPI as well for other ROS. ROS - otherwise negative.   Medical History: Past Medical History:  Diagnosis Date  CHF (congestive heart failure) (CMS/HHS-HCC)  Hypertension   Patient Active Problem List  Diagnosis  Papilloma of left breast  Axillary mass, left  Secondary cardiomyopathy (CMS/HHS-HCC)   Past Surgical History:  Procedure Laterality Date  CESAREAN SECTION 1986  and 1988  Tonsilectomy 1997  CHOLECYSTECTOMY 1998  HYSTERECTOMY 2001  ICD Generator Changeout 12/05/2022  Dr. Arlester Ladd  BREAST BIOPSY WITH RADIO FREQUENCY LOCALIZER 01/14/2023  Dr. Larrie Po    Allergies  Allergen Reactions  Peanut Anaphylaxis  Ace Inhibitors Swelling  Morphine  Nausea, Nausea And Vomiting and Vomiting  Meperidine  Rash   Current Outpatient Medications on File Prior to Visit  Medication Sig Dispense Refill  apixaban  (ELIQUIS ) 5 mg tablet Take 5 mg by mouth 2 (two) times daily  carvediloL  (COREG ) 25 MG tablet Take 1 tablet by mouth 2 (two) times daily with meals  FARXIGA  10 mg tablet Take 1 tablet by mouth every morning before breakfast (0630)  FUROsemide  (LASIX ) 20 MG tablet Take 20 mg by mouth once daily  hydrALAZINE  (APRESOLINE ) 100 MG tablet Take 100 mg by mouth 3 (three) times daily  isosorbide  mononitrate (IMDUR ) 30 MG ER tablet Take 60 mg by mouth  potassium chloride  (KLOR-CON ) 10 MEQ ER tablet Take 1 tablet by mouth once daily  acetaminophen  (TYLENOL ) 500 MG tablet Take 1,000 mg by mouth  diphenhydrAMINE  (BENADRYL ) 25 mg tablet Take 25 mg by mouth   No current facility-administered medications on file prior to visit.   Family History  Problem Relation Age of Onset  Obesity Mother  High blood pressure (Hypertension) Mother  Diabetes Mother  Coronary Artery Disease (Blocked arteries around heart) Mother  Skin cancer Father  High blood pressure (Hypertension) Sister  Breast cancer Sister  Colon cancer Brother    Social History   Tobacco Use  Smoking Status Never   Smokeless Tobacco Never    Social History   Socioeconomic History  Marital status: Married  Tobacco Use  Smoking status: Never  Smokeless tobacco: Never  Substance and Sexual Activity  Alcohol use: Never  Drug use: Never   Social Drivers of Corporate investment banker Strain: Low Risk (05/04/2020)  Received from [facility]  Overall Financial Resource Strain (CARDIA)  Difficulty of Paying Living Expenses: Not hard at all  Food Insecurity: No Food Insecurity (05/04/2020)  Received from [facility]  Hunger Vital Sign  Worried About Running Out of Food in the Last Year: Never true  Ran Out of Food in the Last Year: Never true  Transportation Needs: No Transportation Needs (05/04/2020)  Received from [facility] - Transportation  Lack of Transportation (Medical): No  Lack of Transportation (Non-Medical): No  Physical Activity: Sufficiently Active (05/04/2020)  Received [facility]  Exercise Vital Sign  Days of Exercise per Week: 5 days  Minutes of Exercise per Session: 40 min  Stress: No Stress Concern Present (05/04/2020)  Received from [facility] - Occupational Stress Questionnaire  Feeling of Stress : Not at all  Social Connections: Moderately Integrated (05/04/2020)  Received from [facility]  Social Connection and Isolation Panel [NHANES]  Frequency of Communication with Friends and Family: More than three times a week  Frequency of Social Gatherings with Friends and Family: More than three times a week  Attends Religious  Services: More than 4 times per year  Active Member of Clubs or Organizations: Yes  Attends Banker Meetings: More than 4 times per year  Marital Status: Divorced  Housing Stability: Unknown (02/29/2024)  Housing Stability Vital Sign  Homeless in the Last Year: No   Objective:   Vitals:  BP: 138/70  Pulse: 84  Temp: 36.7 C (98.1 F)  SpO2: 98%  Weight: 79.7 kg (175 lb 12.8 oz)   Height: 162.6 cm (5\' 4" )  PainSc: 0-No pain   Body mass index is 30.18 kg/m.  Gen: No acute distress. Well nourished and well groomed.  Neurological: Alert and oriented to person, place, and time. Coordination normal.  Head: Normocephalic and atraumatic.  Eyes: Conjunctivae are normal. Pupils are equal, round, and reactive to light. No scleral icterus.  Neck: Normal range of motion. Neck supple. No tracheal deviation or thyromegaly present.  Cardiovascular: Normal rate, regular rhythm, normal heart sounds and intact distal pulses. Exam reveals no gallop and no friction rub. No murmur heard. Breast: Multiple scars on the breast from previous excisional biopsies. Ptotic bilaterally. No palpable masses in the breast itself. No nipple retraction or nipple discharge. Left axilla has a relatively firm feeling mass around 10 to 11 cm in size. This does appear to be a bit mobile, but due to the location is not extremely mobile. It is between the pectoralis and the latissimus. Respiratory: Effort normal. No respiratory distress. No chest wall tenderness. Breath sounds normal. No wheezes, rales or rhonchi.  GI: Soft. Bowel sounds are normal. The abdomen is soft and nontender. There is no rebound and no guarding.  Musculoskeletal: Normal range of motion. Extremities are nontender.  Lymphadenopathy: No cervical, preauricular, postauricular or axillary adenopathy is present Skin: Skin is warm and dry. No rash noted. No diaphoresis. No erythema. No pallor. No clubbing, cyanosis, or edema.  Psychiatric: Normal mood and affect. Behavior is normal. Judgment and thought content normal.   Labs HCT 33.3 Glucose 117,  Cr 1.74  Assessment and Plan:   ICD-10-CM  1. Papilloma of left breast D24.2 Mammo US  breast biopsy LT w clip and specimen image   2. Axillary mass, left R22.32 Mammo US  breast biopsy LT w clip and specimen image   3. 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 CT scan inconclusive for sarcoma. Discussed surgical excision with potential skin removal and drain placement. Explained risks of bleeding, nerve involvement, and potential scapula winging. Discussed the potential for dysfunction of the left arm and hand. Discussed lymph node removal and potential for lymphedema.  Discussed recurrence risk if sarcoma found. Explained sarcoma metastasis patterns, no lung involvement on CT. - Schedule core needle biopsy of mass first. If this is a sarcoma, I would get an MRI of this mass first. - Plan potential skin removal and drain placement during surgery. - Coordinate with cardiology for anticoagulation management, consider Lovenox  bridge. - Monitor for bleeding and nerve involvement during surgery. - Consider vascular surgery consultation if vascular involvement suspected. - Post-operative physical therapy referral after drain removal.  Supraventricular tachycardia (SVT) SVT with risk of exacerbation during surgery due to stress. - Coordinate with cardiology to manage SVT risk during surgery. - Monitor on telemetry post-operatively.  No follow-ups on file.  Deliliah Fender, MD FACS Surgical Oncology, General Surgery, Trauma and Critical Care [facility]

## 2024-03-31 NOTE — Anesthesia Procedure Notes (Signed)
 Anesthesia Regional Block: Pectoralis block   Pre-Anesthetic Checklist: , timeout performed,  Correct Patient, Correct Site, Correct Laterality,  Correct Procedure, Correct Position, site marked,  Risks and benefits discussed,  Pre-op evaluation,  At surgeon's request and post-op pain management  Laterality: Left  Prep: Maximum Sterile Barrier Precautions used, chloraprep       Needles:  Injection technique: Single-shot  Needle Type: Echogenic Stimulator Needle     Needle Length: 9cm  Needle Gauge: 22     Additional Needles:   Procedures:,,,, ultrasound used (permanent image in chart),,    Narrative:  Start time: 03/31/2024 8:37 AM End time: 03/31/2024 8:40 AM Injection made incrementally with aspirations every 5 mL.  Performed by: Personally  Anesthesiologist: Vernadine Golas, MD  Additional Notes: Risks, benefits, and alternative discussed. Patient gave consent for procedure. Patient prepped and draped in sterile fashion. Sedation administered, patient remains easily responsive to voice. Relevant anatomy identified with ultrasound guidance. Local anesthetic given in 5cc increments with no signs or symptoms of intravascular injection. No pain or paraesthesias with injection. Patient monitored throughout procedure with signs of LAST or immediate complications. Tolerated well. Ultrasound image placed in chart.  Amador Junes, MD

## 2024-03-31 NOTE — Anesthesia Procedure Notes (Addendum)
 Procedure Name: LMA Insertion Date/Time: 03/31/2024 9:19 AM  Performed by: Candance Certain, CRNAPre-anesthesia Checklist: Patient identified, Emergency Drugs available, Suction available and Patient being monitored Patient Re-evaluated:Patient Re-evaluated prior to induction Oxygen Delivery Method: Circle System Utilized Preoxygenation: Pre-oxygenation with 100% oxygen Induction Type: IV induction Ventilation: Mask ventilation without difficulty LMA: LMA inserted LMA Size: 4.0 Number of attempts: 1 Placement Confirmation: positive ETCO2 Tube secured with: Tape Dental Injury: Teeth and Oropharynx as per pre-operative assessment  Comments: Atraumatic induction/LMA insertion. Dentition and oral mucosa as per preop.

## 2024-03-31 NOTE — Discharge Instructions (Addendum)

## 2024-03-31 NOTE — Op Note (Signed)
 Excision of 13 cm left axillary mass, left  Breast Radioactive seed localized excisional biopsy x 2  Indications: This patient presents with history of enlarging left axillary mass, left breast papillomas x 2  Pre-operative Diagnosis: left axillary mass, left breast papillomas  Post-operative Diagnosis: same as above  Surgeon: Lockie Rima   Assistant: Woodroe Hazel, RNFA  Anesthesia: General endotracheal anesthesia  ASA Class: 3  Procedure Details  The patient was seen in the Holding Room. The risks, benefits, complications, treatment options, and expected outcomes were discussed with the patient. The possibilities of bleeding, infection, the need for additional procedures, failure to diagnose a condition, and creating a complication requiring transfusion or operation were discussed with the patient. The patient concurred with the proposed plan, giving informed consent.  The site of surgery properly noted/marked. The patient was taken to Operating Room # 2, identified, and the procedure verified as left Breast Seed localized excisional biopsy x 2 and excision of left axillary mass. The left arm, breast, and chest were prepped and draped in standard fashion. A Time Out was held and the above information confirmed.   The axilla was addressed first.  A curvilinear incision was over the mass.  The tissues were divided with the cautery.  A portion of the latissimus had to be open to create adequate space for the mass.  Some of the tissues over the mass were divided with the cautery, but the short harmonic focus was used to divide the denser tissues with the small blood vessels involved.  The mass was very well encapsulated and dissection was taken just directly involving the mass.  Once we got deeper, there was some fascia involved in the mass.  There also were some of the muscular fibers from the latissimus and subscapularis that required division.  This did not involve the brachial plexus or the  axillary vein or artery.  The thoracodorsal neurovascular bundle also was not involved.  The muscular tissue that was divided was a bit easy so orange clips were used to identify for radiation if required some of the oozing areas.  The harmonic scalpel was also used to assist with hemostasis.  Heema blast was placed in the cavity as well.  This wound was closed with 3 layers including a deep layer of 2-0 Vicryl interrupted sutures, 3-0 Vicryl deep dermal sutures, and 4-0 Monocryl running subcuticular sutures.  The excisional biopsies were performed by creating a superolateral curvilinear incision between the previously placed radioactive seeds.  Dissection was carried down around the superolateral point of maximum signal intensity. The cautery was used to perform the dissection.   Specimen radiography confirmed inclusion of the mammographic lesion and the seed. This location hadn't been previously biopsied.  Hemostasis was achieved with cautery. A large clip was placed at the area.  The more inferior seed was then addressed similarly.   The specimen was inked with the margin marker paint kit.    Specimen radiography confirmed inclusion of the mammographic lesion, the clip, and the seed.  The background signal in the breast was zero.   The wound was irrigated and reinspected for hemostasis.  The skin was then closed with 3-0 vicryl in layers and 4-0 monocryl subcuticular suture.      Sterile dressings were applied. At the end of the operation, all sponge, instrument, and needle counts were correct.  Findings: Large soft mass well encapsulated, grossly clear surgical margins.    Estimated Blood Loss:  min  Specimens: left axillary mass, left breast tissue UOQ with seed, left breast tissue UOQ periareolar with seed,          Complications:  None; patient tolerated the procedure well.         Disposition: PACU - hemodynamically stable.         Condition: stable

## 2024-03-31 NOTE — Interval H&P Note (Signed)
 History and Physical Interval Note:  03/31/2024 8:26 AM  Anita Warren  has presented today for surgery, with the diagnosis of LEFT BREAST PAPILLOMA LEFT AXILLARY MASS.  The various methods of treatment have been discussed with the patient and family. After consideration of risks, benefits and other options for treatment, the patient has consented to  Procedure(s) with comments: EXCISION, MASS, BREAST, USING RADIOLOGICAL MARKER (Left) EXCISION MASS UPPER EXTREMITIES (Left) - LEFT BREAST SEED LOCALIZED EXCISIONAL BIOPSY x 2 EXCISION OF LEFT AXILLARY MASS as a surgical intervention.  The patient's history has been reviewed, patient examined, no change in status, stable for surgery.  I have reviewed the patient's chart and labs.  Questions were answered to the patient's satisfaction.     Lockie Rima

## 2024-04-01 ENCOUNTER — Encounter (HOSPITAL_COMMUNITY): Payer: Self-pay | Admitting: General Surgery

## 2024-04-01 ENCOUNTER — Encounter: Payer: 59 | Admitting: Cardiovascular Disease

## 2024-04-01 NOTE — Anesthesia Postprocedure Evaluation (Signed)
 Anesthesia Post Note  Patient: Anita Warren  Procedure(s) Performed: EXCISION, MASS, BREAST, USING RADIOLOGICAL MARKER (Left: Breast) EXCISION OF AXILLARY MASS     Patient location during evaluation: PACU Anesthesia Type: General Level of consciousness: awake and alert Pain management: pain level controlled Vital Signs Assessment: post-procedure vital signs reviewed and stable Respiratory status: spontaneous breathing, nonlabored ventilation, respiratory function stable and patient connected to nasal cannula oxygen Cardiovascular status: blood pressure returned to baseline and stable Postop Assessment: no apparent nausea or vomiting Anesthetic complications: no   No notable events documented.  Last Vitals:  Vitals:   03/31/24 1400 03/31/24 1415  BP: 113/83   Pulse: 61 63  Resp: 19 19  Temp: (!) 35.7 C (!) 36.1 C  SpO2: 97% 97%    Last Pain:  Vitals:   03/31/24 1208  TempSrc:   PainSc: Asleep                 Nithin Demeo S

## 2024-04-05 ENCOUNTER — Ambulatory Visit: Payer: Self-pay | Admitting: General Surgery

## 2024-04-05 ENCOUNTER — Encounter

## 2024-04-05 ENCOUNTER — Encounter (HOSPITAL_COMMUNITY): Admitting: Cardiology

## 2024-04-05 ENCOUNTER — Other Ambulatory Visit (HOSPITAL_COMMUNITY)

## 2024-04-05 LAB — SURGICAL PATHOLOGY

## 2024-04-07 ENCOUNTER — Ambulatory Visit (HOSPITAL_COMMUNITY)

## 2024-04-07 ENCOUNTER — Encounter

## 2024-04-07 ENCOUNTER — Encounter (HOSPITAL_COMMUNITY): Admitting: Cardiology

## 2024-04-11 ENCOUNTER — Ambulatory Visit: Attending: Cardiovascular Disease

## 2024-04-11 DIAGNOSIS — Z95810 Presence of automatic (implantable) cardiac defibrillator: Secondary | ICD-10-CM | POA: Diagnosis not present

## 2024-04-11 DIAGNOSIS — I5022 Chronic systolic (congestive) heart failure: Secondary | ICD-10-CM | POA: Diagnosis not present

## 2024-04-12 LAB — SURGICAL PATHOLOGY

## 2024-04-12 NOTE — Progress Notes (Signed)
 EPIC Encounter for ICM Monitoring  Patient Name: Anita Warren is a 63 y.o. female Date: 04/12/2024 Primary Care Physican: Kathyleen Parkins, MD Primary Cardiologist: Mitzie Anda Electrophysiologist: Mealor 11/27/2023 Weight: 174 lbs 01/01/2024 Weight: 174 lbs            02/03/2024 Weight: 171 lbs     04/12/2024 Weight: 175 lbs                                            Spoke with patient and heart failure questions reviewed.  Transmission results reviewed.  Pt asymptomatic for fluid accumulation during decreased impedance.  She stated she was in a lot of pain after having Lipoma removed and did notice any fluid symptoms.    Diet:  Fluid intake is not consistent   Coruve thoracic impedance suggesting possible possible fluid accumulation from 6/6-6/15 which correlates with hospital procedure.    Prescribed:  Furosemide  20 mg take 1 tablet (20 mg total) daily  Potassium 10 mEq take 1 tablet daily. Spironolactone  50 mg take 1 tablet daily     Labs: 03/29/2024 Creatinine 1.52, BUN 21, Potassium 3.9, Sodium 136, GFR 39 01/06/2024 Creatinine 1.74, BUN 22, Potassium 4.4, Sodium 137, GFR 33 A complete set of results can be found in Results Review.   Recommendations:  No changes and encouraged to call if experiencing any fluid symptoms.   Follow-up plan: ICM clinic phone appointment on 05/30/2024.   91 day device clinic remote transmission 06/09/2024.      EP/Cardiology Office Visits:  05/25/2024 with Dr Arlester Ladd.  06/06/2024 with Dr Mitzie Anda.   Copy of ICM check sent to Dr. Arlester Ladd.    3 month ICM trend: 04/12/2024.    12-14 Month ICM trend:     Almyra Jain, RN 04/12/2024 11:57 AM

## 2024-04-19 NOTE — Progress Notes (Signed)
 Remote ICD transmission.

## 2024-04-21 ENCOUNTER — Encounter (HOSPITAL_COMMUNITY): Payer: Self-pay | Admitting: General Surgery

## 2024-04-21 NOTE — OR Nursing (Signed)
 Late entry:  Due to a error with the procedure names and radioactive seeds, this procedure was documented incorrectly the day of surgery.  I reviewed the operative note dictated by the surgeon and corrected the OR record to match the completed procedure.  Berwyn Eagles, RN.

## 2024-04-24 ENCOUNTER — Other Ambulatory Visit (HOSPITAL_COMMUNITY): Payer: Self-pay | Admitting: Family Medicine

## 2024-05-14 ENCOUNTER — Other Ambulatory Visit (HOSPITAL_COMMUNITY): Payer: Self-pay | Admitting: Cardiology

## 2024-05-23 ENCOUNTER — Other Ambulatory Visit (HOSPITAL_COMMUNITY): Payer: Self-pay | Admitting: Cardiology

## 2024-05-23 DIAGNOSIS — I5022 Chronic systolic (congestive) heart failure: Secondary | ICD-10-CM

## 2024-05-25 ENCOUNTER — Encounter: Admitting: Cardiovascular Disease

## 2024-05-30 ENCOUNTER — Ambulatory Visit: Attending: Cardiovascular Disease

## 2024-05-30 DIAGNOSIS — Z95810 Presence of automatic (implantable) cardiac defibrillator: Secondary | ICD-10-CM

## 2024-05-30 DIAGNOSIS — I5022 Chronic systolic (congestive) heart failure: Secondary | ICD-10-CM

## 2024-06-01 NOTE — Progress Notes (Signed)
 EPIC Encounter for ICM Monitoring  Patient Name: Anita Warren is a 63 y.o. female Date: 06/01/2024 Primary Care Physican: Bertell Satterfield, MD Primary Cardiologist: Rolan Electrophysiologist: Mealor 11/27/2023 Weight: 174 lbs 01/01/2024 Weight: 174 lbs            02/03/2024 Weight: 171 lbs     04/12/2024 Weight: 175 lbs                                            Spoke with patient and heart failure questions reviewed.  Transmission results reviewed.  Pt asymptomatic for fluid accumulation.  Reports feeling well at this time and voices no complaints.     Diet:  Fluid intake is not consistent   Coruve thoracic impedance suggesting normal fluid levels with the exception of possible possible fluid accumulation from 7/10-7/17.   Prescribed:  Furosemide  20 mg take 1 tablet (20 mg total) daily  Potassium 10 mEq take 1 tablet daily. Spironolactone  50 mg take 1 tablet daily     Labs: 03/29/2024 Creatinine 1.52, BUN 21, Potassium 3.9, Sodium 136, GFR 39 01/06/2024 Creatinine 1.74, BUN 22, Potassium 4.4, Sodium 137, GFR 33 A complete set of results can be found in Results Review.   Recommendations:  No changes and encouraged to call if experiencing any fluid symptoms.   Follow-up plan: ICM clinic phone appointment on 07/04/2024.   91 day device clinic remote transmission 09/07/2024.      EP/Cardiology Office Visits:  09/02/2024 with Dr Nancey.  06/06/2024 with Dr Rolan with Echo.   Copy of ICM check sent to Dr. Nancey.    3 month ICM trend: 05/30/2024.    12-14 Month ICM trend:     [name], RN 06/01/2024 9:56 AM

## 2024-06-06 ENCOUNTER — Ambulatory Visit (HOSPITAL_COMMUNITY): Payer: Self-pay | Admitting: Cardiology

## 2024-06-06 ENCOUNTER — Ambulatory Visit (HOSPITAL_COMMUNITY)
Admission: RE | Admit: 2024-06-06 | Discharge: 2024-06-06 | Disposition: A | Discharge status: Home / Self Care | Source: Ambulatory Visit | Attending: Cardiology | Admitting: Cardiology

## 2024-06-06 ENCOUNTER — Ambulatory Visit (HOSPITAL_BASED_OUTPATIENT_CLINIC_OR_DEPARTMENT_OTHER)
Admission: RE | Admit: 2024-06-06 | Discharge: 2024-06-06 | Disposition: A | Discharge status: Home / Self Care | Source: Ambulatory Visit | Attending: Cardiology | Admitting: Cardiology

## 2024-06-06 ENCOUNTER — Ambulatory Visit (HOSPITAL_COMMUNITY): Payer: Self-pay | Admitting: Family Medicine

## 2024-06-06 ENCOUNTER — Encounter (HOSPITAL_COMMUNITY): Payer: Self-pay | Admitting: Cardiology

## 2024-06-06 VITALS — BP 100/60 | HR 58 | Wt 172.4 lb

## 2024-06-06 DIAGNOSIS — I4892 Unspecified atrial flutter: Secondary | ICD-10-CM | POA: Diagnosis not present

## 2024-06-06 DIAGNOSIS — I5022 Chronic systolic (congestive) heart failure: Secondary | ICD-10-CM

## 2024-06-06 DIAGNOSIS — N183 Chronic kidney disease, stage 3 unspecified: Secondary | ICD-10-CM | POA: Insufficient documentation

## 2024-06-06 DIAGNOSIS — Z95810 Presence of automatic (implantable) cardiac defibrillator: Secondary | ICD-10-CM | POA: Insufficient documentation

## 2024-06-06 DIAGNOSIS — Z79899 Other long term (current) drug therapy: Secondary | ICD-10-CM | POA: Diagnosis not present

## 2024-06-06 DIAGNOSIS — I351 Nonrheumatic aortic (valve) insufficiency: Secondary | ICD-10-CM | POA: Diagnosis not present

## 2024-06-06 DIAGNOSIS — Z7901 Long term (current) use of anticoagulants: Secondary | ICD-10-CM | POA: Insufficient documentation

## 2024-06-06 DIAGNOSIS — I130 Hypertensive heart and chronic kidney disease with heart failure and stage 1 through stage 4 chronic kidney disease, or unspecified chronic kidney disease: Secondary | ICD-10-CM | POA: Insufficient documentation

## 2024-06-06 DIAGNOSIS — I472 Ventricular tachycardia, unspecified: Secondary | ICD-10-CM | POA: Insufficient documentation

## 2024-06-06 DIAGNOSIS — Z7984 Long term (current) use of oral hypoglycemic drugs: Secondary | ICD-10-CM | POA: Insufficient documentation

## 2024-06-06 DIAGNOSIS — I428 Other cardiomyopathies: Secondary | ICD-10-CM | POA: Diagnosis not present

## 2024-06-06 LAB — ECHOCARDIOGRAM COMPLETE
Area-P 1/2: 3.19 cm2
Calc EF: 47.5 %
S' Lateral: 5.2 cm
Single Plane A2C EF: 57.1 %
Single Plane A4C EF: 39.9 %

## 2024-06-06 LAB — BASIC METABOLIC PANEL WITH GFR
Anion gap: 7 (ref 5–15)
BUN: 21 mg/dL (ref 8–23)
CO2: 23 mmol/L (ref 22–32)
Calcium: 9.4 mg/dL (ref 8.9–10.3)
Chloride: 106 mmol/L (ref 98–111)
Creatinine, Ser: 1.6 mg/dL — ABNORMAL HIGH (ref 0.44–1.00)
GFR, Estimated: 36 mL/min — ABNORMAL LOW (ref >60)
Glucose, Bld: 117 mg/dL — ABNORMAL HIGH (ref 70–99)
Potassium: 3.7 mmol/L (ref 3.5–5.1)
Sodium: 136 mmol/L (ref 135–145)

## 2024-06-06 LAB — BRAIN NATRIURETIC PEPTIDE: B Natriuretic Peptide: 18.6 pg/mL (ref 0.0–100.0)

## 2024-06-06 MED ORDER — ISOSORBIDE MONONITRATE ER 30 MG PO TB24: 90.0000 mg | ORAL_TABLET | Freq: Every day | ORAL | 11 refills | Status: AC

## 2024-06-06 NOTE — Patient Instructions (Addendum)
 Medication Changes:  Increase Imdur  to 90 mg (3 tabs) Daily  Lab Work:  Labs done today, your results will be available in MyChart, we will contact you for abnormal readings.   Special Instructions // Education:  Do the following things EVERYDAY: Weigh yourself in the morning before breakfast. Write it down and keep it in a log. Take your medicines as prescribed Eat low salt foods--Limit salt (sodium) to 2000 mg per day.  Stay as active as you can everyday Limit all fluids for the day to less than 2 liters   Follow-Up in: 4 months    At the [facility], you and your health needs are our priority. We have a designated team specialized in the treatment of Heart Failure. This Care Team includes your primary Heart Failure Specialized Cardiologist (physician), Advanced Practice Providers (APPs- Physician Assistants and Nurse Practitioners), and Pharmacist who all work together to provide you with the care you need, when you need it.   You may see any of the following providers on your designated Care Team at your next follow up:  Dr. Toribio Fuel Dr. Ezra Shuck Dr. Ria Commander Dr. Odis Brownie Greig Mosses, NP Caffie Shed, [place] [facility] [place], [place] Beckey Coe, NP [place] Lee, NP Tinnie Redman, PharmD   Please be sure to bring in all your medications bottles to every appointment.   Need to Contact Us :  If you have any questions or concerns before your next appointment please send us  a message through [facility] or call our office at [phone].    TO LEAVE A MESSAGE FOR THE NURSE SELECT OPTION 2, PLEASE LEAVE A MESSAGE INCLUDING: YOUR NAME DATE OF BIRTH CALL BACK NUMBER REASON FOR CALL**this is important as we prioritize the call backs  YOU WILL RECEIVE A CALL BACK THE SAME DAY AS LONG AS YOU CALL BEFORE 4:00 PM

## 2024-06-06 NOTE — Progress Notes (Signed)
 [facility] Note   PCP: Bertell Satterfield, MD HF Cardiology: Dr. Rolan  Chief complaint: CHF  HPI: Anita Warren is a 63 y.o. female with history of viral, non-ischemic cardiomyopathy, severe systolic dysfunction with EF of 10% s/p ICD pacemaker in the setting of VT 02/2011 (St. Jude);  cath 07-16-11 with no CAD.   Admitted 7/15 with acute on chronic systolic CHF she was not taking medications for 4 months because she lost her insurance. She was diuresed with IV lasix  and transitioned to lasix  40 mg daily. She was also discharged on 12.5 mg carvedilol  twice a day, hydralazine  25 mg tid, and 30 mg Imdur  daily. Discharge weight was 176 pounds. She was not on an ACEI due to angioedema.  Admitted from HF clinic 06/02/14 with low output heart failure. Swan placed and showed cardiogenic shock. Ultimately discharged on  Milrinone  0.25 mcg/kg/min. Hospital stay was complicated by atrial flutter. Loaded on amiodarone  and started on eliquis  5 mg twice a day. Met with Dr Fleeta Ochoa and VAD coordinator. She was titrated off the milrinone .  Echo in 8/17 showed EF up to 50%, echo in 8/18 showed EF 45%.  Echo in 11/19 showed EF 35-40%. Echo in 11/21 showed EF stable 40% with normal RV.  Echo in 12/22 showed EF 35-40%, moderate LV dilation, mildly decreased RV systolic function, moderate pericardial effusion.   Echo 6/24, EF 35% with diffuse hypokinesis, mildly decreased RV systolic function, IVC normal.   Echo was done today and reviewed, EF 30% with mild LV dilation, mildly decreased RV systolic function, small pericardial effusion.   Today she returns for HF follow up. She feels well overall, Weight down 5 lbs.  She recently had excision of a benign breast lesion, intraductal papilloma.  No exertional dyspnea or chest pain.  She is exercising regularly.  No lightheadedness.  No orthopnea/PND.   St Jude device interrogation (personally reviewed):  No VT, stable thoracic impedance.    ECG (personally reviewed): NSR, poor RWP  - Echo (7/15): EF 5-10% RV mod to severely dilated.  - Echo (10/15): EF 20-25% RV read as normal - Echo (8/17):  EF 50%, mild LV dilation, normal RV size and systolic function.  - Echo (8/18): EF 45%, diffuse HK worse inferiorly, normal RV size and systolic function.  - Echo (11/19): EF 35-40%, mild LV dilation with diffuse hypokinesis, normal RV size with mildly decreased systolic function.  - Echo (11/21): EF 40%, hypokinesis of the basal to mid septum and the basal inferior wall, RV normal, small pericardial effusion.  - Echo (12/22): EF 35-40%, moderate LV dilation, mildly decreased RV systolic function, moderate pericardial effusion. - Echo (6/24): EF 35% with diffuse hypokinesis, mildly decreased RV systolic function, IVC normal.  - Echo (8/25): EF 30% with mild LV dilation, mildly decreased RV systolic function, small pericardial effusion.   Labs:  HIV negative 2015  2/24: K 3.6, creatinine 1.5 6/24: K 4.1, creatinine 1.44 6/25: K 3.9, creatinine 1.52  SH: Disabled. Has 3 grown children. Does not smoke or drink alcohol.  Re-married (fiance passed away in Jul 15, 2020). 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    FH: Mom died at 15 CAD         Sister and Brother HTN   Review of systems complete and found to be negative unless listed in HPI.    Past Medical History:  Diagnosis Date   AICD (automatic cardioverter/defibrillator) present    St. Jude AICD implanted 03-07-2011  Dr. Allred/-Dr. Rolan now follows   Atrial fibrillation (HCC)    Cardiomyopathy (HCC) 2012   CHF (congestive heart failure) (HCC)    meds controlling, no episodes since 2014   Chronic systolic heart failure (HCC)    Essential hypertension, benign    Headache    History of kidney stones    multiple kidney stones in past   History of medication noncompliance    Hydronephrosis with renal and ureteral calculus obstruction 09/05/2013   Nonischemic cardiomyopathy (HCC)    LVEF  5-10%, likely viral (no CAD by cath 01/30/11)   NSVT (nonsustained ventricular tachycardia) (HCC)    Obesity    Current Outpatient Medications  Medication Sig Dispense Refill   acetaminophen  (TYLENOL ) 500 MG tablet Take 1,000 mg by mouth every 6 (six) hours as needed for headache.      apixaban  (ELIQUIS ) 5 MG TABS tablet Take 1 tablet (5 mg total) by mouth 2 (two) times daily. 60 tablet 5   carvedilol  (COREG ) 25 MG tablet TAKE 1 TABLET BY MOUTH TWICE DAILY WITH A MEAL 180 tablet 3   Coenzyme Q10 (CO Q 10) 100 MG CAPS Take 100 mg by mouth in the morning.     diphenhydrAMINE  (BENADRYL ) 25 MG tablet Take 25 mg by mouth every 6 (six) hours as needed for allergies.     FARXIGA  10 MG TABS tablet TAKE 1 TABLET BY MOUTH ONCE DAILY BEFORE BREAKFAST 90 tablet 0   furosemide  (LASIX ) 20 MG tablet Take 1 tablet (20 mg total) by mouth daily. 90 tablet 3   hydrALAZINE  (APRESOLINE ) 100 MG tablet Take 1 tablet (100 mg total) by mouth 3 (three) times daily. 90 tablet 5   potassium chloride  (KLOR-CON ) 10 MEQ tablet Take 1 tablet (10 mEq total) by mouth daily. PLEASE SCHEDULE APPOINTMENT FOR MORE REFILLS 30 tablet 0   spironolactone  (ALDACTONE ) 50 MG tablet Take 1 tablet by mouth once daily 90 tablet 3   isosorbide  mononitrate (IMDUR ) 30 MG 24 hr tablet Take 3 tablets (90 mg total) by mouth daily. 90 tablet 11   No current facility-administered medications for this encounter.   BP 100/60   Pulse (!) 58   Wt 78.2 kg (172 lb 6.4 oz)   LMP 10/28/1999   SpO2 98%   BMI 29.59 kg/m   Wt Readings from Last 3 Encounters:  06/06/24 78.2 kg (172 lb 6.4 oz)  03/31/24 79.4 kg (175 lb)  03/29/24 79.7 kg (175 lb 12.8 oz)   PHYSICAL EXAM: General: NAD Neck: No JVD, no thyromegaly or thyroid  nodule.  Lungs: Clear to auscultation bilaterally with normal respiratory effort. CV: Nondisplaced PMI.  Heart regular S1/S2, no S3/S4, no murmur.  No peripheral edema.  No carotid bruit.  Normal pedal pulses.  Abdomen: Soft,  nontender, no hepatosplenomegaly, no distention.  Skin: Intact without lesions or rashes.  Neurologic: Alert and oriented x 3.  Psych: Normal affect. Extremities: No clubbing or cyanosis.  HEENT: Normal.   ASSESSMENT & PLAN: 1. Chronic Systolic Heart Failure: Nonischemic cardiomyopathy thought to potentially be due to viral myocarditis noted initially in 2011. Cath 2012 normal coronaries.  St Jude ICD. Echo (7/15) with EF 5-10% and RV mod-severely dilated with moderately decreased systolic function. Repeat echo 10/15 with EF up to 20-25%. She was on milrinone  briefly then stopped.  Echo in 8/17 with EF up to 50%. Repeat echo 11/19 showed EF back down to 35-40% with normal RV. Echo in 11/21 showed stable EF 40% and septal + inferior wall  motion abnormalities.  Echo in 12/22 showed EF 35-40%, mild RV dysfunction, moderate pericardial effusion.  Echo 6/24 with EF 35%, mild RV dysfunction. Echo today was reviewed, EF 30% with mild LV dilation, mildly decreased RV systolic function, small pericardial effusion.  NYHA class I, not volume overloaded by exam or Corvue.   - Continue Lasix  20 mg daily + 10 KCL daily, BMET/BNP today. - Continue Farxiga  10 mg daily.  - Continue spironolactone  50 mg daily.  - Continue Coreg  25 mg bid.   - Continue hydralazine  100 mg tid and increase Imdur  to 90 mg daily.  - No ACEI/ARB/ARNI due to recurrent episodes of angioedema with ACEI and also ARB.  2. Atrial flutter: Paroxysmal, NSR on ECG today. - She is now off amiodarone .  If atrial flutter recurs, should have ablation.  - Continue Eliquis  5 mg bid.  3. Angioedema: Had in past on ACEI, had another episode after ACEI stopped.  She is no longer on ACEI, ARNI, or ARB. ? Hereditary or acquired C1 inhibitor deficiency. No further problems with this. 4. CKD: Stage 3. Baseline SCr 1.3-1.5. - Continue SGLT2i.  - BMET today.  Follow up in 4 months with APP.   I spent 32 minutes reviewing records, interviewing/examining  patient, and managing orders.   Ezra Shuck, MD  06/06/2024

## 2024-06-08 ENCOUNTER — Ambulatory Visit: Payer: 59

## 2024-06-08 DIAGNOSIS — I428 Other cardiomyopathies: Secondary | ICD-10-CM | POA: Diagnosis not present

## 2024-06-09 LAB — CUP PACEART REMOTE DEVICE CHECK
Battery Remaining Longevity: 104 mo
Battery Remaining Percentage: 83 %
Battery Voltage: 3.01 V
Brady Statistic RV Percent Paced: 1 %
Date Time Interrogation Session: 20250813025640
HighPow Impedance: 43 Ohm
Implantable Lead Connection Status: 753985
Implantable Lead Implant Date: 20120511
Implantable Lead Location: 753860
Implantable Lead Model: 7120
Implantable Pulse Generator Implant Date: 20240209
Lead Channel Impedance Value: 550 Ohm
Lead Channel Pacing Threshold Amplitude: 0.75 V
Lead Channel Pacing Threshold Pulse Width: 0.5 ms
Lead Channel Sensing Intrinsic Amplitude: 12 mV
Lead Channel Setting Pacing Amplitude: 2.5 V
Lead Channel Setting Pacing Pulse Width: 0.5 ms
Lead Channel Setting Sensing Sensitivity: 0.5 mV
Pulse Gen Serial Number: 211012829
Zone Setting Status: 755011

## 2024-06-10 ENCOUNTER — Encounter: Payer: Self-pay | Admitting: Cardiovascular Disease

## 2024-06-12 ENCOUNTER — Ambulatory Visit: Payer: Self-pay | Admitting: Cardiovascular Disease

## 2024-06-18 ENCOUNTER — Other Ambulatory Visit (HOSPITAL_COMMUNITY): Payer: Self-pay | Admitting: Cardiology

## 2024-06-18 DIAGNOSIS — I5022 Chronic systolic (congestive) heart failure: Secondary | ICD-10-CM

## 2024-07-04 ENCOUNTER — Ambulatory Visit: Attending: Cardiovascular Disease

## 2024-07-04 DIAGNOSIS — I5022 Chronic systolic (congestive) heart failure: Secondary | ICD-10-CM

## 2024-07-04 DIAGNOSIS — Z95810 Presence of automatic (implantable) cardiac defibrillator: Secondary | ICD-10-CM | POA: Diagnosis not present

## 2024-07-06 ENCOUNTER — Telehealth: Payer: Self-pay

## 2024-07-06 NOTE — Progress Notes (Signed)
 EPIC Encounter for ICM Monitoring  Patient Name: Anita Warren is a 63 y.o. female Date: 07/06/2024 Primary Care Physican: Bertell Satterfield, MD Primary Cardiologist: Rolan Electrophysiologist: Mealor 11/27/2023 Weight: 174 lbs 01/01/2024 Weight: 174 lbs            02/03/2024 Weight: 171 lbs     04/12/2024 Weight: 175 lbs     06/06/2024 Weight: 172 lbs                                        Attempted call to patient and unable to reach.  Left detailed message per DPR regarding transmission.  Transmission results reviewed.    Diet:  Fluid intake is not consistent   Coruve thoracic impedance suggesting normal fluid levels within the last month.   Prescribed:  Furosemide  20 mg take 1 tablet (20 mg total) daily  Potassium 10 mEq take 1 tablet daily. Spironolactone  50 mg take 1 tablet daily     Labs: 06/06/2024 Creatinine 1.60, BUN 21, Potassium 3.7, Sodium 136, GFR 36 03/29/2024 Creatinine 1.52, BUN 21, Potassium 3.9, Sodium 136, GFR 39 01/06/2024 Creatinine 1.74, BUN 22, Potassium 4.4, Sodium 137, GFR 33 A complete set of results can be found in Results Review.   Recommendations:  Left voice mail with ICM number and encouraged to call if experiencing any fluid symptoms.   Follow-up plan: ICM clinic phone appointment on 08/15/2024.   91 day device clinic remote transmission 09/07/2024.      EP/Cardiology Office Visits:  09/02/2024 with Dr Nancey.  10/06/2024 with HF clinic.   Copy of ICM check sent to Dr. Nancey.    3 month ICM trend: 07/04/2024.    12-14 Month ICM trend:     Anita GORMAN Garner, RN 07/06/2024 8:36 AM

## 2024-07-06 NOTE — Telephone Encounter (Signed)
 Remote ICM transmission received.  Attempted call to patient regarding ICM remote transmission.  Left detailed message per DPR with ICM phone number to return call for any questions, concerns or fluid symptoms.

## 2024-07-16 ENCOUNTER — Other Ambulatory Visit (HOSPITAL_COMMUNITY): Payer: Self-pay | Admitting: Cardiology

## 2024-07-16 DIAGNOSIS — I5022 Chronic systolic (congestive) heart failure: Secondary | ICD-10-CM

## 2024-07-21 NOTE — Progress Notes (Signed)
Remote ICD Transmission.

## 2024-07-25 ENCOUNTER — Other Ambulatory Visit (HOSPITAL_COMMUNITY): Payer: Self-pay | Admitting: Family Medicine

## 2024-07-29 ENCOUNTER — Ambulatory Visit: Attending: Cardiovascular Disease | Admitting: Cardiovascular Disease

## 2024-07-29 ENCOUNTER — Encounter: Payer: Self-pay | Admitting: Cardiovascular Disease

## 2024-07-29 VITALS — BP 120/80 | HR 86 | Ht 64.0 in | Wt 174.8 lb

## 2024-07-29 DIAGNOSIS — I4892 Unspecified atrial flutter: Secondary | ICD-10-CM | POA: Diagnosis not present

## 2024-07-29 NOTE — Progress Notes (Signed)
   PCP: Bertell Satterfield, MD Primary Cardiologist: Dr Rolan Primary EP: Dr Kelsie  Anita Warren is a 63 y.o. female who presents today for routine electrophysiology followup.    Since last being seen in our clinic, the patient reports doing very well.  She underwent a generator change on Dec 05, 2020.   Today, she denies symptoms of palpitations, chest pain, shortness of breath,  lower extremity edema, dizziness, presyncope, syncope, or ICD shocks.  The patient is otherwise without complaint today.      GEN- The patient is well appearing, alert and oriented x 3 today.   Head- normocephalic, atraumatic Eyes-  Sclera clear, conjunctiva pink Lungs- Clear to ausculation bilaterally, normal work of breathing Chest- ICD pocket is well healed Heart- Regular rate and rhythm, no murmurs, rubs or gallops, PMI not laterally displaced Extremities- no clubbing, cyanosis, or edema  ICD interrogation- reviewed in detail today,  See PACEART report    TTE 10/14/2021   LVEF 35-40%  TTE June 06, 2024 EF 30 to 35%.  Grade 1 diastolic dysfunction.  Small pericardial effusion is present. Wt Readings from Last 3 Encounters:  07/29/24 174 lb 12.8 oz (79.3 kg)  06/06/24 172 lb 6.4 oz (78.2 kg)  03/31/24 175 lb (79.4 kg)    Assessment and Plan:  1.  Chronic systolic dysfunction/ nonischemic CM euvolemic today Stable on an appropriate medical regimen Normal ICD function - occasional low defib impedances, appears chronic See Pace Art report No changes today she is not device dependant today followed in ICM device clinic   2. Remote atypical atrial flutter No arrhythmias in past 2 years off AAD therapy Chads2vasc score is 3.  She is on eliquis   3. HTN Stable No change required today  Return in a year  Anita FORBES Furbish, MD 07/29/2024 3:30 PM

## 2024-07-29 NOTE — Patient Instructions (Signed)

## 2024-08-08 ENCOUNTER — Encounter: Payer: Self-pay | Admitting: Gastroenterology

## 2024-08-13 ENCOUNTER — Other Ambulatory Visit (HOSPITAL_COMMUNITY): Payer: Self-pay | Admitting: Cardiology

## 2024-08-15 ENCOUNTER — Ambulatory Visit: Attending: Cardiovascular Disease

## 2024-08-15 DIAGNOSIS — Z95810 Presence of automatic (implantable) cardiac defibrillator: Secondary | ICD-10-CM

## 2024-08-15 DIAGNOSIS — I5022 Chronic systolic (congestive) heart failure: Secondary | ICD-10-CM | POA: Diagnosis not present

## 2024-08-16 NOTE — Progress Notes (Signed)
 EPIC Encounter for ICM Monitoring  Patient Name: Anita Warren is a 63 y.o. female Date: 08/16/2024 Primary Care Physican: Gladis Lauraine BRAVO, NP Primary Cardiologist: Rolan Electrophysiologist: Mealor 11/27/2023 Weight: 174 lbs 01/01/2024 Weight: 174 lbs            02/03/2024 Weight: 171 lbs     04/12/2024 Weight: 175 lbs     06/06/2024 Weight: 172 lbs  07/29/2024 Office Weight: 174 lbs   08/16/2024 Weight:  172 lbs                                    Spoke with patient and heart failure questions reviewed.  Transmission results reviewed.  Pt asymptomatic for fluid accumulation.  Reports feeling well at this time and voices no complaints.     Diet:  Fluid intake is not consistent   Since 07/04/2024 ICM Remote Transmission:  Coruve thoracic impedance suggesting normal fluid levels with the exception of possible fluid accumulation from 07/04/2024-07/12/2024 and 07/15/2024-07/18/2024   Prescribed:  Furosemide  20 mg take 1 tablet (20 mg total) daily  Potassium 10 mEq take 1 tablet daily. Spironolactone  50 mg take 1 tablet daily     Labs: 06/06/2024 Creatinine 1.60, BUN 21, Potassium 3.7, Sodium 136, GFR 36 03/29/2024 Creatinine 1.52, BUN 21, Potassium 3.9, Sodium 136, GFR 39 01/06/2024 Creatinine 1.74, BUN 22, Potassium 4.4, Sodium 137, GFR 33 A complete set of results can be found in Results Review.   Recommendations:  No changes and encouraged to call if experiencing any fluid symptoms.   Follow-up plan: ICM clinic phone appointment on 09/19/2024.   91 day device clinic remote transmission 09/07/2024.      EP/Cardiology Office Visits:  Recall 07/24/2025 with Dr Nancey.  10/06/2024 with HF clinic.   Copy of ICM check sent to Dr. Nancey.    Remote monitoring is medically necessary for Heart Failure Management.    Daily Thoracic Impedance ICM trend: 05/17/2024 through 08/15/2024.    12-14 Month Thoracic Impedance ICM trend:     [name], RN 08/16/2024 4:08 PM

## 2024-08-19 ENCOUNTER — Other Ambulatory Visit (HOSPITAL_COMMUNITY): Payer: Self-pay | Admitting: Cardiology

## 2024-08-19 DIAGNOSIS — I5022 Chronic systolic (congestive) heart failure: Secondary | ICD-10-CM

## 2024-08-24 ENCOUNTER — Other Ambulatory Visit (HOSPITAL_COMMUNITY): Payer: Self-pay | Admitting: Family Medicine

## 2024-08-29 ENCOUNTER — Ambulatory Visit: Admitting: Gastroenterology

## 2024-08-29 ENCOUNTER — Telehealth: Payer: Self-pay | Admitting: *Deleted

## 2024-08-29 ENCOUNTER — Encounter: Payer: Self-pay | Admitting: Gastroenterology

## 2024-08-29 VITALS — BP 108/68 | HR 76 | Temp 98.4°F | Ht 64.0 in | Wt 173.2 lb

## 2024-08-29 DIAGNOSIS — R1310 Dysphagia, unspecified: Secondary | ICD-10-CM | POA: Diagnosis not present

## 2024-08-29 DIAGNOSIS — Z1211 Encounter for screening for malignant neoplasm of colon: Secondary | ICD-10-CM | POA: Diagnosis not present

## 2024-08-29 NOTE — Patient Instructions (Signed)
 Colonoscopy and upper endoscopy to be scheduled.  You will need to hold Eliquis  48 hours prior to procedures.  You will need to hold Farxiga  72 hours prior to procedures.  You will receive full written instructions once you have been scheduled.

## 2024-08-29 NOTE — Telephone Encounter (Signed)
  Request for patient to stop medication prior to procedure or is needing cleareance  08/29/24  Anita Warren Aug 20, 1961  What type of surgery is being performed? Colonoscopy/EGD w/esophageal dilation   When is surgery scheduled? TBD  What type of clearance is required (medical or pharmacy to hold medication or both? medication  Are there any medications that need to be held prior to surgery and how long? Eliquis  x 2 days   Name of physician performing surgery?  [facility] at [employer]: [phone], option 5 Fax: [phone]  Anesthesia type (none, local, MAC, general)? MAC   ? Yes ? No Patient can hold medication as requested   Signature: ___________________________

## 2024-08-29 NOTE — Progress Notes (Signed)
 GI Office Note    Referring Provider: Gladis Lauraine BRAVO, NP Primary Care Physician:  Gladis Lauraine BRAVO, NP  Primary Gastroenterologist: Carlin POUR. Cindie, DO   Chief Complaint   Chief Complaint  Patient presents with   New Patient (Initial Visit)    Pt here for visit before colonoscopy due to blood thinners    History of Present Illness   Anita Warren is a 63 y.o. female presenting today at the request of Lauraine Gladis, NP for screening colonoscopy.  Discussed the use of AI scribe software for clinical note transcription with the patient, who gave verbal consent to proceed.  History of Present Illness   Anita Warren is a 63 year old female with viral nonischemic cardiomyopathy and atrial fibrillation who presents with esophageal discomfort and concerns about potential gastrointestinal issues.  She experiences esophageal discomfort, particularly after eating, described as a sensation of food not moving properly down her esophagus. She sometimes applies pressure to her upper abdomin or leans forward to facilitate the movement of food. This issue has been ongoing for a long time but has not worsened over time. Occasionally, she uses liquids to help wash down food. She recalls a recent episode where pecans did not go down smoothly and came back up. She feels like her enlarged heart may be causing pressure on her esophagus. No heartburn, indigestion, blood in the stool, black stools, or unintentional weight loss. Regular bowel movements most days.   She is concerned about the possibility of colon cancer, especially since her brother was diagnosed last year, at the age of 80.    Her past medical history includes viral cardiomyopathy diagnosed in 2011, which led to her disability. She experienced severe symptoms, including fluid around her heart and lungs, and was in cardiogenic shock in 2014 with an ejection fraction of 5%. She has a defibrillator and is on  medications including Eliquis  and Farxiga . Current LVEF 30-35% on 05/2024 ECHO. 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   ECHO 12/2023: LVEF 30-35%  Prior Data   Colonoscopy 2013: -sessile polyp 1.2cm proximal transverse colon removed, inflammatory polyp -internal hemorrhoids -next colonoscopy 5 years  Medications   Current Outpatient Medications  Medication Sig Dispense Refill   acetaminophen  (TYLENOL ) 500 MG tablet Take 1,000 mg by mouth every 6 (six) hours as needed for headache.      apixaban  (ELIQUIS ) 5 MG TABS tablet Take 1 tablet by mouth twice daily 180 tablet 1   carvedilol  (COREG ) 25 MG tablet TAKE 1 TABLET BY MOUTH TWICE DAILY WITH A MEAL 180 tablet 3   Coenzyme Q10 (CO Q 10) 100 MG CAPS Take 100 mg by mouth in the morning.     diphenhydrAMINE  (BENADRYL ) 25 MG tablet Take 25 mg by mouth every 6 (six) hours as needed for allergies.     FARXIGA  10 MG TABS tablet TAKE 1 TABLET BY MOUTH ONCE DAILY BEFORE BREAKFAST 90 tablet 1   furosemide  (LASIX ) 20 MG tablet Take 1 tablet (20 mg total) by mouth daily. 90 tablet 3   hydrALAZINE  (APRESOLINE ) 100 MG tablet TAKE 1 TABLET BY MOUTH THREE TIMES DAILY 270 tablet 1   isosorbide  mononitrate (IMDUR ) 30 MG 24 hr tablet Take 3 tablets (90 mg total) by mouth daily. 90 tablet 11   potassium chloride  (KLOR-CON ) 10 MEQ tablet Take 1 tablet (10 mEq total) by mouth daily. 90 tablet 0   spironolactone  (ALDACTONE ) 50 MG tablet  Take 1 tablet by mouth once daily 90 tablet 3   No current facility-administered medications for this visit.    Allergies   Allergies as of 08/29/2024 - Review Complete 08/29/2024  Allergen Reaction Noted   Peanut-containing drug products Anaphylaxis 04/27/2012   Ace inhibitors Swelling    Morphine  Nausea And Vomiting    Demerol  Rash 03/21/2011    Past Medical History   Past Medical History:  Diagnosis Date   AICD (automatic cardioverter/defibrillator) present    St. Jude AICD  implanted 03-07-2011 Dr. Allred/-Dr. Rolan now follows   Atrial fibrillation (HCC)    Cardiomyopathy (HCC) 2012   CHF (congestive heart failure) (HCC)    meds controlling, no episodes since 2014   Chronic systolic heart failure (HCC)    Essential hypertension, benign    Headache    History of kidney stones    multiple kidney stones in past   History of medication noncompliance    Hydronephrosis with renal and ureteral calculus obstruction 09/05/2013   Nonischemic cardiomyopathy (HCC)    LVEF 5-10%, likely viral (no CAD by cath 01/30/11)   NSVT (nonsustained ventricular tachycardia) (HCC)    Obesity     Past Surgical History   Past Surgical History:  Procedure Laterality Date   ABDOMINAL HYSTERECTOMY     BREAST BIOPSY Left 10/19/2019   Procedure: BREAST BIOPSY WITH NEEDLE LOCALIZATION;  Surgeon: Mavis Anes, MD;  Location: [facility];  Service: General;  Laterality: Left;   BREAST BIOPSY Right 11/18/2022   Intraductal papilloma/US  RT BREAST BX W LOC DEV 1ST LESION IMG BX SPEC US  GUIDE 11/18/2022 [facility]   BREAST BIOPSY Left 12/22/2023   US  LT BREAST BX W LOC DEV 1ST LESION IMG BX SPEC US  GUIDE 12/22/2023 [facility]   BREAST BIOPSY Left 03/29/2024   US  LT RADIOACTIVE SEED LOC 03/29/2024 [facility]   BREAST BIOPSY  03/29/2024   MM LT RADIOACTIVE SEED LOC MAMMO GUIDE 03/29/2024 [facility]   BREAST BIOPSY WITH RADIO FREQUENCY LOCALIZER Right 01/14/2023   Intraductal papilloma/Procedure: BREAST BIOPSY WITH RADIO FREQUENCY LOCALIZER;  Surgeon: Mavis Anes, MD;  Location: [facility];  Service: General;  Laterality: Right;   BREAST LUMPECTOMY  1989   L breast- benign   CARDIAC CATHETERIZATION     CARDIAC DEFIBRILLATOR PLACEMENT  02/2011   SJM by MILUS   CESAREAN SECTION     x 2   CHOLECYSTECTOMY     COLONOSCOPY  07/02/2012   Procedure: COLONOSCOPY;  Surgeon: Margo LITTIE Haddock, MD;  Location: [facility];  Service: Endoscopy;  Laterality: N/A;  1:15/PATIENT HAS A DEFIBRILLATOR    CYSTOSCOPY W/ URETERAL STENT PLACEMENT Right 09/06/2013   Procedure: CYSTOSCOPY WITH RIGHT RETROGRADE PYELOGRAM; RIGHT URETERAL STENT PLACEMENT;  Surgeon: Mohammad I Javaid, MD;  Location: [facility];  Service: Urology;  Laterality: Right;   CYSTOSCOPY W/ URETERAL STENT PLACEMENT N/A 04/28/2016   Procedure: CYSTOSCOPY WITH  RIGHT RETROGRADE PYELOGRAM/RIGHT URETERAL STENT PLACEMENT;  Surgeon: Alm Fragmin, MD;  Location: [facility];  Service: Urology;  Laterality: N/A;   CYSTOSCOPY W/ URETERAL STENT REMOVAL Right 07/21/2016   Procedure: CYSTOSCOPY WITH STENT REMOVAL;  Surgeon: Garnette Shack, MD;  Location: [facility];  Service: Urology;  Laterality: Right;   CYSTOSCOPY/URETEROSCOPY/HOLMIUM LASER/STENT PLACEMENT Right 07/21/2016   Procedure: CYSTOSCOPY/URETEROSCOPY/HOLMIUM LASER/   right retrograde pylegram;  Surgeon: Garnette Shack, MD;  Location: [facility];  Service: Urology;  Laterality: Right;   endovenous laser ablation and stab phlebectomies Right 08/01/2020   EVLA of right greater saphenous vein and >  20 stab phlebectomies   EXCISION OF NERVE MASS, AXILLA  03/31/2024   Procedure: EXCISION OF AXILLARY MASS;  Surgeon: Aron Shoulders, MD;  Location: [facility];  Service: General;;   ICD GENERATOR CHANGEOUT N/A 12/05/2022   Procedure: ICD GENERATOR CHANGEOUT;  Surgeon: Nancey [name], MD;  Location: [facility];  Service: Cardiovascular;  Laterality: N/A;   RADIOACTIVE SEED GUIDED AXILLARY SENTINEL LYMPH NODE Left 03/31/2024   Procedure: RADIOACTIVE SEED GUIDED EXCISIONAL BREAST BIOPSY;  Surgeon: Aron Shoulders, MD;  Location: [facility];  Service: General;  Laterality: Left;   TONSILLECTOMY     TUBAL LIGATION      Past Family History   Family History  Adopted: Yes  Problem Relation Age of Onset   Diabetes Mother    Breast cancer Sister    Diabetes Sister        borderline   Colon cancer Brother 56   Hypertension Brother     Past Social History   Social History   Socioeconomic History    Marital status: Married    Spouse name: Not on file   Number of children: Not on file   Years of education: Not on file   Highest education level: Not on file  Occupational History   Occupation: Curator: [employer]    Comment: [employer], was at [employer] for 16 years   Tobacco Use   Smoking status: Never   Smokeless tobacco: Never  Vaping Use   Vaping status: Never Used  Substance and Sexual Activity   Alcohol use: No   Drug use: No   Sexual activity: Yes    Birth control/protection: Surgical    Comment: hyst  Other Topics Concern   Not on file  Social History Narrative   Lives in [place] with spouse.  3 grown children.  Previously worked in [employer] at [employer].   Social Drivers of Health   Financial Resource Strain: Low Risk  (05/04/2020)   Overall Financial Resource Strain (CARDIA)    Difficulty of Paying Living Expenses: Not hard at all  Food Insecurity: No Food Insecurity (05/04/2020)   Hunger Vital Sign    Worried About Running Out of Food in the Last Year: Never true    Ran Out of Food in the Last Year: Never true  Transportation Needs: No Transportation Needs (05/04/2020)   PRAPARE - Administrator, Civil Service (Medical): No    Lack of Transportation (Non-Medical): No  Physical Activity: Sufficiently Active (05/04/2020)   Exercise Vital Sign    Days of Exercise per Week: 5 days    Minutes of Exercise per Session: 40 min  Stress: No Stress Concern Present (05/04/2020)   [employer] of Occupational Health - Occupational Stress Questionnaire    Feeling of Stress : Not at all  Social Connections: Moderately Integrated (05/04/2020)   Social Connection and Isolation Panel    Frequency of Communication with Friends and Family: More than three times a week    Frequency of Social Gatherings with Friends and Family: More than three times a week    Attends Religious Services: More than 4 times per year    Active Member of [employer] or  Organizations: Yes    Attends Banker Meetings: More than 4 times per year    Marital Status: Divorced  Intimate Partner Violence: Not At Risk (05/04/2020)   Humiliation, Afraid, Rape, and Kick questionnaire    Fear of Current or  Ex-Partner: No    Emotionally Abused: No    Physically Abused: No    Sexually Abused: No    Review of Systems   General: Negative for anorexia, weight loss, fever, chills, fatigue, weakness. Eyes: Negative for vision changes.  ENT: Negative for hoarseness, difficulty swallowing , nasal congestion. See hpi CV: Negative for chest pain, angina, palpitations, dyspnea on exertion, peripheral edema.  Respiratory: Negative for dyspnea at rest, dyspnea on exertion, cough, sputum, wheezing.  GI: See history of present illness. GU:  Negative for dysuria, hematuria, urinary incontinence, urinary frequency, nocturnal urination.  MS: Negative for joint pain, low back pain.  Derm: Negative for rash or itching.  Neuro: Negative for weakness, abnormal sensation, seizure, frequent headaches, memory loss,  confusion.  Psych: Negative for anxiety, depression, suicidal ideation, hallucinations.  Endo: Negative for unusual weight change.  Heme: Negative for bruising or bleeding. Allergy: Negative for rash or hives.  Physical Exam   BP 108/68   Pulse 76   Temp 98.4 F (36.9 C)   Ht 5' 4 (1.626 m)   Wt 173 lb 3.2 oz (78.6 kg)   LMP 10/28/1999   BMI 29.73 kg/m    General: Well-nourished, well-developed in no acute distress.  Head: Normocephalic, atraumatic.   Eyes: Conjunctiva pink, no icterus. Mouth: Oropharyngeal mucosa moist and pink  Neck: Supple without thyromegaly, masses, or lymphadenopathy.  Lungs: Clear to auscultation bilaterally.  Heart: Regular rate and rhythm, no murmurs rubs or gallops.  Abdomen: Bowel sounds are normal, nontender, nondistended, no hepatosplenomegaly or masses,  no abdominal bruits or hernia, no rebound or guarding.    Rectal: not performed Extremities: No lower extremity edema. No clubbing or deformities.  Neuro: Alert and oriented x 4 , grossly normal neurologically.  Skin: Warm and dry, no rash or jaundice.   Psych: Alert and cooperative, normal mood and affect.  Labs   Lab Results  Component Value Date   NA 136 06/06/2024   CL 106 06/06/2024   K 3.7 06/06/2024   CO2 23 06/06/2024   BUN 21 06/06/2024   CREATININE 1.60 (H) 06/06/2024   GFRNONAA 36 (L) 06/06/2024   CALCIUM  9.4 06/06/2024   ALBUMIN 3.5 10/17/2019   GLUCOSE 117 (H) 06/06/2024   Lab Results  Component Value Date   WBC 5.4 03/29/2024   HGB 10.9 (L) 03/29/2024   HCT 34.0 (L) 03/29/2024   MCV 91.9 03/29/2024   PLT 194 03/29/2024   Lab Results  Component Value Date   ALT 11 10/17/2019   AST 14 (L) 10/17/2019   ALKPHOS 40 10/17/2019   BILITOT 0.6 10/17/2019     Imaging Studies   No results found.  Assessment/Plan:    Esophageal dysphagia Chronic dysphagia with sensation of food impaction in the chest, she feels it could possibly be due to esophageal compression by an enlarged heart. Differential includes esophageal stricture, esophageal motility d/o, or external compression.   - EGD/ED. ASA 3. Room 3.  I have discussed the risks, alternatives, benefits with regards to but not limited to the risk of reaction to medication, bleeding, infection, perforation and the patient is agreeable to proceed. Written consent to be obtained. -Hold Eliquis  48 hours, we will get cardiology approval   Screening colonoscopy: FH colon cancer, brother at age 46 -overdue for colonoscopy. ASA 3.  I have discussed the risks, alternatives, benefits with regards to but not limited to the risk of reaction to medication, bleeding, infection, perforation and the patient is agreeable to proceed. Written  consent to be obtained. -hold Eliquis  48 hours before. To obtain cardiology approval.  Anemia: Chronic anemia. Hgb in the 10-11 range for at  least 5 years. No recent anemia labs. Suspect anemia of chronic disease. To check for any recent labs done by other providers, if not, we will update labs.    Sonny RAMAN. Ezzard, MHS, PA-C [facility]

## 2024-09-02 ENCOUNTER — Encounter: Admitting: Cardiovascular Disease

## 2024-09-07 ENCOUNTER — Other Ambulatory Visit: Payer: Self-pay | Admitting: Family Medicine

## 2024-09-07 ENCOUNTER — Ambulatory Visit: Payer: 59

## 2024-09-07 ENCOUNTER — Other Ambulatory Visit (HOSPITAL_COMMUNITY): Payer: Self-pay | Admitting: Cardiology

## 2024-09-07 DIAGNOSIS — I5022 Chronic systolic (congestive) heart failure: Secondary | ICD-10-CM

## 2024-09-08 ENCOUNTER — Telehealth (HOSPITAL_BASED_OUTPATIENT_CLINIC_OR_DEPARTMENT_OTHER): Payer: Self-pay | Admitting: *Deleted

## 2024-09-08 LAB — CUP PACEART REMOTE DEVICE CHECK
Battery Remaining Longevity: 101 mo
Battery Remaining Percentage: 82 %
Battery Voltage: 3.01 V
Brady Statistic RV Percent Paced: 1 %
Date Time Interrogation Session: 20251112020559
HighPow Impedance: 38 Ohm
Implantable Lead Connection Status: 753985
Implantable Lead Implant Date: 20120511
Implantable Lead Location: 753860
Implantable Lead Model: 7120
Implantable Pulse Generator Implant Date: 20240209
Lead Channel Impedance Value: 460 Ohm
Lead Channel Pacing Threshold Amplitude: 0.75 V
Lead Channel Pacing Threshold Pulse Width: 0.5 ms
Lead Channel Sensing Intrinsic Amplitude: 10.3 mV
Lead Channel Setting Pacing Amplitude: 2.5 V
Lead Channel Setting Pacing Pulse Width: 0.5 ms
Lead Channel Setting Sensing Sensitivity: 0.5 mV
Pulse Gen Serial Number: 211012829
Zone Setting Status: 755011

## 2024-09-08 NOTE — Telephone Encounter (Signed)
 Patient with diagnosis of atrial fibrillation on Eliquis  for anticoagulation.    What type of surgery is being performed? Colonoscopy/EGD w/esophageal dilation    When is surgery scheduled? TBD   CHA2DS2-VASc Score = 3   This indicates a 3.2% annual risk of stroke. The patient's score is based upon: CHF History: 1 HTN History: 1 Diabetes History: 0 Stroke History: 0 Vascular Disease History: 0 Age Score: 0 Gender Score: 1    CrCl 52 Platelet count 194  Patient has not had an Afib/aflutter ablation in the last 3 months, DCCV within the last 4 weeks or a watchman implanted in the last 45 days   Per office protocol, patient can hold Eliquis  for 2 days prior to procedure.   Patient will not need bridging with Lovenox  (enoxaparin ) around procedure.  **This guidance is not considered finalized until pre-operative APP has relayed final recommendations.**

## 2024-09-08 NOTE — Telephone Encounter (Signed)
   Name: Anita Warren  DOB: 09/10/1961  MRN: [id number]  Primary Cardiologist: None   Preoperative team, please contact this patient and set up a phone call appointment for further preoperative risk assessment. Please obtain consent and complete medication review. Thank you for your help.  I confirm that guidance regarding antiplatelet and oral anticoagulation therapy has been completed and, if necessary, noted below.  Per office protocol, patient can hold Eliquis  for 2 days prior to procedure.   Patient will not need bridging with Lovenox  (enoxaparin ) around procedure.  I also confirmed the patient resides in the state of [place] . As per [facility] Medical Board telemedicine laws, the patient must reside in the state in which the provider is licensed.   Josefa CHRISTELLA Beauvais, NP 09/08/2024, 9:15 AM [facility]

## 2024-09-08 NOTE — Telephone Encounter (Signed)
 Pt has been scheduled tele preop appt 09/13/24 due to Dr. Cindie waiting for clearance before scheduling procedure. Med rec and consent are done.

## 2024-09-08 NOTE — Telephone Encounter (Signed)
 Pt has been scheduled tele preop appt 09/13/24 due to Dr. Cindie waiting for clearance before scheduling procedure. Med rec and consent are done.       Patient Consent for Virtual Visit        Anita Warren has provided verbal consent on 09/08/2024 for a virtual visit (video or telephone).   CONSENT FOR VIRTUAL VISIT FOR:  Anita Warren  By participating in this virtual visit I agree to the following:  I hereby voluntarily request, consent and authorize [facility] and its employed or contracted physicians, physician assistants, nurse practitioners or other licensed health care professionals (the Practitioner), to provide me with telemedicine health care services (the "Services) as deemed necessary by the treating Practitioner. I acknowledge and consent to receive the Services by the Practitioner via telemedicine. I understand that the telemedicine visit will involve communicating with the Practitioner through live audiovisual communication technology and the disclosure of certain medical information by electronic transmission. I acknowledge that I have been given the opportunity to request an in-person assessment or other available alternative prior to the telemedicine visit and am voluntarily participating in the telemedicine visit.  I understand that I have the right to withhold or withdraw my consent to the use of telemedicine in the course of my care at any time, without affecting my right to future care or treatment, and that the Practitioner or I may terminate the telemedicine visit at any time. I understand that I have the right to inspect all information obtained and/or recorded in the course of the telemedicine visit and may receive copies of available information for a reasonable fee.  I understand that some of the potential risks of receiving the Services via telemedicine include:  Delay or interruption in medical evaluation due to technological equipment  failure or disruption; Information transmitted may not be sufficient (e.g. poor resolution of images) to allow for appropriate medical decision making by the Practitioner; and/or  In rare instances, security protocols could fail, causing a breach of personal health information.  Furthermore, I acknowledge that it is my responsibility to provide information about my medical history, conditions and care that is complete and accurate to the best of my ability. I acknowledge that Practitioner's advice, recommendations, and/or decision may be based on factors not within their control, such as incomplete or inaccurate data provided by me or distortions of diagnostic images or specimens that may result from electronic transmissions. I understand that the practice of medicine is not an exact science and that Practitioner makes no warranties or guarantees regarding treatment outcomes. I acknowledge that a copy of this consent can be made available to me via my patient portal [facility] MyChart), or I can request a printed copy by calling the office of [facility].    I understand that my insurance will be billed for this visit.   I have read or had this consent read to me. I understand the contents of this consent, which adequately explains the benefits and risks of the Services being provided via telemedicine.  I have been provided ample opportunity to ask questions regarding this consent and the Services and have had my questions answered to my satisfaction. I give my informed consent for the services to be provided through the use of telemedicine in my medical care

## 2024-09-12 NOTE — Progress Notes (Unsigned)
 Virtual Visit via Telephone Note   Because of Anita Warren co-morbid illnesses, she is at least at moderate risk for complications without adequate follow up.  This format is felt to be most appropriate for this patient at this time.  Due to technical limitations with video connection (technology), today's appointment will be conducted as an audio only telehealth visit, and Anita Warren verbally agreed to proceed in this manner.   All issues noted in this document were discussed and addressed.  No physical exam could be performed with this format.  Evaluation Performed:  Preoperative cardiovascular risk assessment _____________   Date:  09/12/2024   Patient ID:  Anita Warren, DOB [birth year], MRN [id number] Patient Location:  Home Provider location:   Office  Primary Care Provider:  Gladis Lauraine BRAVO, NP Primary Cardiologist:  None  Chief Complaint / Patient Profile   63 y.o. y/o female with a h/o chronic systolic CHF, paroxysmal atrial fibrillation, essential hypertension who is pending colonoscopy/EGD and presents today for telephonic preoperative cardiovascular risk assessment.  History of Present Illness    Anita Warren is a 63 y.o. female who presents via audio/video conferencing for a telehealth visit today.  Pt was last seen in cardiology clinic on 07/29/2024 by Dr. Nancey.  At that time Anita Warren was doing well .  The patient is now pending procedure as outlined above. Since her last visit, she continues to be stable from a cardiac standpoint.  Today she denies chest pain, shortness of breath, lower extremity edema, fatigue, palpitations, melena, hematuria, hemoptysis, diaphoresis, weakness, presyncope, syncope, orthopnea, and PND.   Past Medical History    Past Medical History:  Diagnosis Date   AICD (automatic cardioverter/defibrillator) present    [employer] AICD implanted 03-07-2011 Dr. Allred/-Dr. Rolan now follows   Atrial  fibrillation (HCC)    Cardiomyopathy (HCC) 2012   CHF (congestive heart failure) (HCC)    meds controlling, no episodes since 2014   Chronic systolic heart failure (HCC)    Essential hypertension, benign    Headache    History of kidney stones    multiple kidney stones in past   History of medication noncompliance    Hydronephrosis with renal and ureteral calculus obstruction 09/05/2013   Nonischemic cardiomyopathy (HCC)    LVEF 5-10%, likely viral (no CAD by cath 01/30/11)   NSVT (nonsustained ventricular tachycardia) (HCC)    Obesity    Past Surgical History:  Procedure Laterality Date   ABDOMINAL HYSTERECTOMY     BREAST BIOPSY Left 10/19/2019   Procedure: BREAST BIOPSY WITH NEEDLE LOCALIZATION;  Surgeon: Mavis Anes, MD;  Location: [facility];  Service: General;  Laterality: Left;   BREAST BIOPSY Right 11/18/2022   Intraductal papilloma/US  RT BREAST BX W LOC DEV 1ST LESION IMG BX SPEC US  GUIDE 11/18/2022 [facility]   BREAST BIOPSY Left 12/22/2023   US  LT BREAST BX W LOC DEV 1ST LESION IMG BX SPEC US  GUIDE 12/22/2023 [facility]   BREAST BIOPSY Left 03/29/2024   US  LT RADIOACTIVE SEED LOC 03/29/2024 [facility]   BREAST BIOPSY  03/29/2024   MM LT RADIOACTIVE SEED LOC MAMMO GUIDE 03/29/2024 [facility]   BREAST BIOPSY WITH RADIO FREQUENCY LOCALIZER Right 01/14/2023   Intraductal papilloma/Procedure: BREAST BIOPSY WITH RADIO FREQUENCY LOCALIZER;  Surgeon: Mavis Anes, MD;  Location: [facility];  Service: General;  Laterality: Right;   BREAST LUMPECTOMY  1989   L breast- benign   CARDIAC CATHETERIZATION     CARDIAC DEFIBRILLATOR PLACEMENT  02/2011   SJM by MILUS   CESAREAN SECTION     x 2   CHOLECYSTECTOMY     COLONOSCOPY  07/02/2012   Procedure: COLONOSCOPY;  Surgeon: Margo LITTIE Haddock, MD;  Location: [facility];  Service: Endoscopy;  Laterality: N/A;  1:15/PATIENT HAS A DEFIBRILLATOR   CYSTOSCOPY W/ URETERAL STENT PLACEMENT Right 09/06/2013   Procedure: CYSTOSCOPY WITH RIGHT  RETROGRADE PYELOGRAM; RIGHT URETERAL STENT PLACEMENT;  Surgeon: Mohammad I Javaid, MD;  Location: [facility];  Service: Urology;  Laterality: Right;   CYSTOSCOPY W/ URETERAL STENT PLACEMENT N/A 04/28/2016   Procedure: CYSTOSCOPY WITH  RIGHT RETROGRADE PYELOGRAM/RIGHT URETERAL STENT PLACEMENT;  Surgeon: Alm Fragmin, MD;  Location: [facility];  Service: Urology;  Laterality: N/A;   CYSTOSCOPY W/ URETERAL STENT REMOVAL Right 07/21/2016   Procedure: CYSTOSCOPY WITH STENT REMOVAL;  Surgeon: Garnette Shack, MD;  Location: [facility];  Service: Urology;  Laterality: Right;   CYSTOSCOPY/URETEROSCOPY/HOLMIUM LASER/STENT PLACEMENT Right 07/21/2016   Procedure: CYSTOSCOPY/URETEROSCOPY/HOLMIUM LASER/   right retrograde pylegram;  Surgeon: Garnette Shack, MD;  Location: [facility];  Service: Urology;  Laterality: Right;   endovenous laser ablation and stab phlebectomies Right 08/01/2020   EVLA of right greater saphenous vein and > 20 stab phlebectomies   EXCISION OF NERVE MASS, AXILLA  03/31/2024   Procedure: EXCISION OF AXILLARY MASS;  Surgeon: Aron Shoulders, MD;  Location: [facility];  Service: General;;   ICD GENERATOR CHANGEOUT N/A 12/05/2022   Procedure: ICD ISABELLA PAP;  Surgeon: Nancey [name], MD;  Location: [facility];  Service: Cardiovascular;  Laterality: N/A;   RADIOACTIVE SEED GUIDED AXILLARY SENTINEL LYMPH NODE Left 03/31/2024   Procedure: RADIOACTIVE SEED GUIDED EXCISIONAL BREAST BIOPSY;  Surgeon: Aron Shoulders, MD;  Location: [facility];  Service: General;  Laterality: Left;   TONSILLECTOMY     TUBAL LIGATION      Allergies  Allergies  Allergen Reactions   Peanut-Containing Drug Products Anaphylaxis   Ace Inhibitors Swelling   Morphine  Nausea And Vomiting   Demerol  Rash    Home Medications    Prior to Admission medications   Medication Sig Start Date End Date Taking? Authorizing Provider  acetaminophen  (TYLENOL ) 500 MG tablet Take 1,000 mg by mouth every 6 (six) hours as needed for  headache.     [provider]  apixaban  (ELIQUIS ) 5 MG TABS tablet Take 1 tablet by mouth twice daily 08/24/24   Glena Harlene HERO, FNP  carvedilol  (COREG ) 25 MG tablet TAKE 1 TABLET BY MOUTH TWICE DAILY WITH A MEAL 12/16/23   Rolan Ezra RAMAN, MD  Coenzyme Q10 (CO Q 10) 100 MG CAPS Take 100 mg by mouth in the morning.    [provider]  diphenhydrAMINE  (BENADRYL ) 25 MG tablet Take 25 mg by mouth every 6 (six) hours as needed for allergies.    [provider]  FARXIGA  10 MG TABS tablet TAKE 1 TABLET BY MOUTH ONCE DAILY BEFORE BREAKFAST 08/15/24   Rolan Ezra RAMAN, MD  furosemide  (LASIX ) 20 MG tablet Take 1 tablet by mouth once daily 09/08/24   McLean, Dalton S, MD  hydrALAZINE  (APRESOLINE ) 100 MG tablet TAKE 1 TABLET BY MOUTH THREE TIMES DAILY 08/24/24   [place], Harlene HERO, FNP  isosorbide  mononitrate (IMDUR ) 30 MG 24 hr tablet Take 3 tablets (90 mg total) by mouth daily. 06/06/24   Rolan Ezra RAMAN, MD  potassium chloride  (KLOR-CON ) 10 MEQ tablet Take 1 tablet (10 mEq total) by mouth daily. 08/19/24   Rolan Ezra RAMAN, MD  spironolactone  (ALDACTONE ) 50 MG tablet Take 1 tablet by mouth once daily 09/08/24   Rolan Ezra RAMAN, MD    Physical Exam    Vital Signs:  XZANDRIA CLEVINGER does not have vital signs available for review today.  Given telephonic nature of communication, physical exam is limited. AAOx3. NAD. Normal affect.  Speech and respirations are unlabored.  Accessory Clinical Findings    None  Assessment & Plan    1.  Preoperative Cardiovascular Risk Assessment: Colonoscopy/EGD, Dr. Cindie, [facility] gastroenterology at [facility], fax #[phone]      Primary Cardiologist: None  Chart reviewed as part of pre-operative protocol coverage. Given past medical history and time since last visit, based on ACC/AHA guidelines, Suheily S Frisby would be at acceptable risk for the planned procedure without further cardiovascular testing.    Patient was advised that if she develops new symptoms prior to surgery to contact our office to arrange a follow-up appointment.  She verbalized understanding.  Per office protocol, patient can hold Eliquis  for 2 days prior to procedure.   Patient will not need bridging with Lovenox  (enoxaparin ) around procedure.  I will route this recommendation to the requesting party via Epic fax function and remove from pre-op pool.       Time:   Today, I have spent 5 minutes with the patient with telehealth technology discussing medical history, symptoms, and management plan.  I spent 10 minutes reviewing patient's past cardiac history and cardiac medications.    Anita CHRISTELLA Beauvais, NP  09/12/2024, 3:44 PM

## 2024-09-13 ENCOUNTER — Ambulatory Visit: Attending: Internal Medicine

## 2024-09-13 DIAGNOSIS — Z01810 Encounter for preprocedural cardiovascular examination: Secondary | ICD-10-CM | POA: Diagnosis not present

## 2024-09-13 NOTE — Progress Notes (Signed)
 Remote ICD Transmission

## 2024-09-19 ENCOUNTER — Ambulatory Visit: Attending: Cardiovascular Disease

## 2024-09-19 DIAGNOSIS — Z95810 Presence of automatic (implantable) cardiac defibrillator: Secondary | ICD-10-CM

## 2024-09-19 DIAGNOSIS — I5022 Chronic systolic (congestive) heart failure: Secondary | ICD-10-CM

## 2024-09-19 NOTE — Telephone Encounter (Signed)
 Pt had telephone visit on 09/13/24. Please advise. Thank you

## 2024-09-19 NOTE — Telephone Encounter (Signed)
 Ok to schedule her tcs/egd/ed.  Hold eliquis  48 hours Make sure we hold farxiga  72 hours   Anita Warren: can you see if there are any labs in [facility] in the last 3 months

## 2024-09-20 ENCOUNTER — Other Ambulatory Visit: Payer: Self-pay | Admitting: *Deleted

## 2024-09-20 ENCOUNTER — Encounter: Payer: Self-pay | Admitting: *Deleted

## 2024-09-20 MED ORDER — PEG 3350-KCL-NA BICARB-NACL 420 G PO SOLR: 4000.0000 mL | Freq: Once | ORAL | 0 refills | Status: AC

## 2024-09-20 NOTE — Telephone Encounter (Signed)
 Pt has been scheduled for 10/03/24. Instructions sent via mychart and prep sent to pharmacy.

## 2024-09-20 NOTE — Telephone Encounter (Signed)
 There are no lab results for this patient under [facility]

## 2024-09-20 NOTE — Progress Notes (Signed)
 Anita Warren                                          MRN: [id number]   09/20/2024   The [facility] Quality Team Specialist reviewed this patient medical record for the purposes of chart review for care gap closure. The following were reviewed: chart review for care gap closure-colorectal cancer screening and glycemic status assessment.    [facility] Quality Team

## 2024-09-20 NOTE — Progress Notes (Signed)
 EPIC Encounter for ICM Monitoring  Patient Name: Anita Warren is a 63 y.o. female Date: 09/20/2024 Primary Care Physican: Gladis Lauraine BRAVO, NP Primary Cardiologist: Rolan Electrophysiologist: Mealor 11/27/2023 Weight: 174 lbs 01/01/2024 Weight: 174 lbs            02/03/2024 Weight: 171 lbs     04/12/2024 Weight: 175 lbs     06/06/2024 Weight: 172 lbs  07/29/2024 Office Weight: 174 lbs   08/16/2024 Weight:  172 lbs                                    Spoke with patient and heart failure questions reviewed.  Transmission results reviewed.  Pt asymptomatic for fluid accumulation.  Reports feeling well at this time and voices no complaints.     Diet:  Fluid intake is not consistent   Since 08/15/2024 ICM Remote Transmission:  Coruve thoracic impedance suggesting normal fluid levels.   Prescribed:  Furosemide  20 mg take 1 tablet (20 mg total) daily  Potassium 10 mEq take 1 tablet daily. Spironolactone  50 mg take 1 tablet daily     Labs: 06/06/2024 Creatinine 1.60, BUN 21, Potassium 3.7, Sodium 136, GFR 36 03/29/2024 Creatinine 1.52, BUN 21, Potassium 3.9, Sodium 136, GFR 39 01/06/2024 Creatinine 1.74, BUN 22, Potassium 4.4, Sodium 137, GFR 33 A complete set of results can be found in Results Review.   Recommendations:  No changes and encouraged to call if experiencing any fluid symptoms.   Follow-up plan: ICM clinic phone appointment on 10/31/2024.   91 day device clinic remote transmission 12/07/2024.      EP/Cardiology Office Visits:  Recall 07/24/2025 with Dr Nancey.  10/06/2024 with HF clinic.   Copy of ICM check sent to Dr. Nancey.    Remote monitoring is medically necessary for Heart Failure Management.    Daily Thoracic Impedance ICM trend: 06/21/2024 through 09/19/2024.    12-14 Month Thoracic Impedance ICM trend:     [name], RN 09/20/2024 4:46 PM

## 2024-09-26 ENCOUNTER — Ambulatory Visit: Payer: Self-pay | Admitting: Cardiovascular Disease

## 2024-09-27 NOTE — Telephone Encounter (Signed)
 Labs from 08/17/24: Hgb 10.2, Hct 32.4.  Cre 1.52 eGFR 38  Labs stable. Colonoscopy/EGD as planned.

## 2024-09-29 ENCOUNTER — Encounter (HOSPITAL_COMMUNITY): Payer: Self-pay

## 2024-09-29 ENCOUNTER — Encounter: Payer: Self-pay | Admitting: Cardiovascular Disease

## 2024-09-29 ENCOUNTER — Other Ambulatory Visit: Payer: Self-pay

## 2024-09-29 ENCOUNTER — Encounter (HOSPITAL_COMMUNITY)
Admission: RE | Admit: 2024-09-29 | Discharge: 2024-09-29 | Disposition: A | Source: Ambulatory Visit | Attending: Internal Medicine

## 2024-09-29 NOTE — Patient Instructions (Addendum)
 Your procedure is scheduled on 10/03/24  Report to [facility] Main Entrance at 7:45 A.M.   Call this number if you have problems the morning of surgery:  [phone]  If you experience any cold or flu symptoms such as cough, fever, chills, shortness of breath, etc. between now and your scheduled surgery, please notify us  at the above number.   Remember:  Follow prep instructions    You may drink clear liquids until  5:45 am .  Clear liquids allowed are:  Water , Juice (No red color; non-citric and without pulp; diabetics please choose diet or no sugar options), Carbonated beverages (diabetics please choose diet or no sugar options), Clear Tea (No creamer, milk, or cream, including half & half and powdered creamer), Black Coffee Only (No creamer, milk or cream, including half & half and powdered creamer), and Clear Sports drink (No red color; diabetics please choose diet or no sugar options)    Take these medicines the morning of surgery with A SIP OF WATER  carvedilol ,isosorbide     Eliquis  hold 2 days prior last dose 09/30/24   Farxig last dose on 09/29/24   Use inhaler if needed.     Do not wear jewelry, make-up or nail polish, including gel polish,  artificial nails, or any other type of covering on natural nails.  Do not wear lotions, powders, or perfumes, or deodorant.  Do not shave 48 hours prior to surgery.  Men may shave face and neck.  Do not bring valuables to the hospital.  [facility] is not responsible for any belongings or valuables.  Contacts, dentures or bridgework may not be worn into surgery.  Leave your suitcase in the car.  After surgery it may be brought to your room.  For patients admitted to the hospital, discharge time will be determined by your treatment team.  Patients discharged the day of surgery will not be allowed to drive home and must have someone be with them for 24 hours.   Special instructions: DO NOT SMOKE TOBACCO OR VAPE 24 HOURS PRIOR  TO YOUR PROCEDURE.   Please brush your teeth morning of procedure.   Please read over the following: Anesthesia Post-op Instructions and Care and Recovery After Surgery Monitored Anesthesia Care, Care After The following information offers guidance on how to care for yourself after your procedure. Your health care provider may also give you more specific instructions. If you have problems or questions, contact your health care provider. What can I expect after the procedure? After the procedure, it is common to have: Tiredness. Little or no memory about what happened during or after the procedure. Impaired judgment when it comes to making decisions. Nausea or vomiting. Some trouble with balance. Follow these instructions at home: For the time period you were told by your health care provider:  Rest. Do not participate in activities where you could fall or become injured. Do not drive or use machinery. Do not drink alcohol. Do not take sleeping pills or medicines that cause drowsiness. Do not make important decisions or sign legal documents. Do not take care of children on your own. Medicines Take over-the-counter and prescription medicines only as told by your health care provider. If you were prescribed antibiotics, take them as told by your health care provider. Do not stop using the antibiotic even if you start to feel better. Eating and drinking Follow instructions from your health care provider about what you may eat and drink. Drink enough fluid to keep your  urine pale yellow. If you vomit: Drink clear fluids slowly and in small amounts as you are able. Clear fluids include water , ice chips, low-calorie sports drinks, and fruit juice that has water  added to it (diluted fruit juice). Eat light and bland foods in small amounts as you are able. These foods include bananas, applesauce, rice, lean meats, toast, and crackers. General instructions  Have a responsible adult stay  with you for the time you are told. It is important to have someone help care for you until you are awake and alert. If you have sleep apnea, surgery and some medicines can increase your risk for breathing problems. Follow instructions from your health care provider about wearing your sleep device: When you are sleeping. This includes during daytime naps. While taking prescription pain medicines, sleeping medicines, or medicines that make you drowsy. Do not use any products that contain nicotine or tobacco. These products include cigarettes, chewing tobacco, and vaping devices, such as e-cigarettes. If you need help quitting, ask your health care provider. Contact a health care provider if: You feel nauseous or vomit every time you eat or drink. You feel light-headed. You are still sleepy or having trouble with balance after 24 hours. You get a rash. You have a fever. You have redness or swelling around the IV site. Get help right away if: You have trouble breathing. You have new confusion after you get home. These symptoms may be an emergency. Get help right away. Call 911. Do not wait to see if the symptoms will go away. Do not drive yourself to the hospital. This information is not intended to replace advice given to you by your health care provider. Make sure you discuss any questions you have with your health care provider. Document Revised: 03/10/2022 Document Reviewed: 03/10/2022 Elsevier Patient Education  2024 Elsevier Inc.   Colonoscopy, Adult A colonoscopy is a procedure to look at the entire large intestine. This procedure is done using a long, thin, flexible tube that has a camera on the end. You may have a colonoscopy: As a part of normal colorectal screening. If you have certain symptoms, such as: A low number of red blood cells in your blood (anemia). Diarrhea that does not go away. Pain in your abdomen. Blood in your stool. A colonoscopy can help screen for and  diagnose medical problems, including: An abnormal growth of cells or tissue (tumor). Abnormal growths within the lining of your intestine (polyps). Inflammation. Areas of bleeding. Tell your health care provider about: Any allergies you have. All medicines you are taking, including vitamins, herbs, eye drops, creams, and over-the-counter medicines. Any problems you or family members have had with anesthetic medicines. Any bleeding problems you have. Any surgeries you have had. Any medical conditions you have. Any problems you have had with having bowel movements. Whether you are pregnant or may be pregnant. What are the risks? Generally, this is a safe procedure. However, problems may occur, including: Bleeding. Damage to your intestine. Allergic reactions to medicines given during the procedure. Infection. This is rare. What happens before the procedure? Eating and drinking restrictions Follow instructions from your health care provider about eating or drinking restrictions, which may include: A few days before the procedure: Follow a low-fiber diet. Avoid nuts, seeds, dried fruit, raw fruits, and vegetables. 1-3 days before the procedure: Eat only gelatin dessert or ice pops. Drink only clear liquids, such as water , clear juice, clear broth or bouillon, black coffee or tea, or clear soft drinks  or sports drinks. Avoid liquids that contain red or purple dye. The day of the procedure: Do not eat solid foods. You may continue to drink clear liquids until up to 2 hours before the procedure. Do not eat or drink anything starting 2 hours before the procedure, or within the time period that your health care provider recommends. Bowel prep If you were prescribed a bowel prep to take by mouth (orally) to clean out your colon: Take it as told by your health care provider. Starting the day before your procedure, you will need to drink a large amount of liquid medicine. The liquid will cause  you to have many bowel movements of loose stool until your stool becomes almost clear or light green. If your skin or the opening between the buttocks (anus) gets irritated from diarrhea, you may relieve the irritation using: Wipes with medicine in them, such as adult wet wipes with aloe and vitamin E. A product to soothe skin, such as petroleum jelly. If you vomit while drinking the bowel prep: Take a break for up to 60 minutes. Begin the bowel prep again. Call your health care provider if you keep vomiting or you cannot take the bowel prep without vomiting. To clean out your colon, you may also be given: Laxative medicines. These help you have a bowel movement. Instructions for enema use. An enema is liquid medicine injected into your rectum. Medicines Ask your health care provider about: Changing or stopping your regular medicines or supplements. This is especially important if you are taking iron supplements, diabetes medicines, or blood thinners. Taking medicines such as aspirin  and ibuprofen . These medicines can thin your blood. Do not take these medicines unless your health care provider tells you to take them. Taking over-the-counter medicines, vitamins, herbs, and supplements. General instructions Ask your health care provider what steps will be taken to help prevent infection. These may include washing skin with a germ-killing soap. If you will be going home right after the procedure, plan to have a responsible adult: Take you home from the hospital or clinic. You will not be allowed to drive. Care for you for the time you are told. What happens during the procedure?  An IV will be inserted into one of your veins. You will be given a medicine to make you fall asleep (general anesthetic). You will lie on your side with your knees bent. A lubricant will be put on the tube. Then the tube will be: Inserted into your anus. Gently eased through all parts of your large intestine. Air  will be sent into your colon to keep it open. This may cause some pressure or cramping. Images will be taken with the camera and will appear on a screen. A small tissue sample may be removed to be looked at under a microscope (biopsy). The tissue may be sent to a lab for testing if any signs of problems are found. If small polyps are found, they may be removed and checked for cancer cells. When the procedure is finished, the tube will be removed. The procedure may vary among health care providers and hospitals. What happens after the procedure? Your blood pressure, heart rate, breathing rate, and blood oxygen level will be monitored until you leave the hospital or clinic. You may have a small amount of blood in your stool. You may pass gas and have mild cramping or bloating in your abdomen. This is caused by the air that was used to open your colon during the  exam. If you were given a sedative during the procedure, it can affect you for several hours. Do not drive or operate machinery until your health care provider says that it is safe. It is up to you to get the results of your procedure. Ask your health care provider, or the department that is doing the procedure, when your results will be ready. Summary A colonoscopy is a procedure to look at the entire large intestine. Follow instructions from your health care provider about eating and drinking before the procedure. If you were prescribed an oral bowel prep to clean out your colon, take it as told by your health care provider. During the colonoscopy, a flexible tube with a camera on its end is inserted into the anus and then passed into all parts of the large intestine. This information is not intended to replace advice given to you by your health care provider. Make sure you discuss any questions you have with your health care provider. Document Revised: 11/25/2022 Document Reviewed: 06/05/2021 Elsevier Patient Education  2024 Elsevier  Inc. Upper Endoscopy, Adult Upper endoscopy is a procedure to look inside the upper GI (gastrointestinal) tract. The upper GI tract is made up of: The esophagus. This is the part of the body that moves food from your mouth to your stomach. The stomach. The duodenum. This is the first part of your small intestine. This procedure is also called esophagogastroduodenoscopy (EGD) or gastroscopy. In this procedure, your health care provider passes a thin, flexible tube (endoscope) through your mouth and down your esophagus into your stomach and into your duodenum. A small camera is attached to the end of the tube. Images from the camera appear on a monitor in the exam room. During this procedure, your health care provider may also remove a small piece of tissue to be sent to a lab and examined under a microscope (biopsy). Your health care provider may do an upper endoscopy to diagnose cancers of the upper GI tract. You may also have this procedure to find the cause of other conditions, such as: Stomach pain. Heartburn. Pain or problems when swallowing. Nausea and vomiting. Stomach bleeding. Stomach ulcers. Tell a health care provider about: Any allergies you have. All medicines you are taking, including vitamins, herbs, eye drops, creams, and over-the-counter medicines. Any problems you or family members have had with anesthetic medicines. Any bleeding problems you have. Any surgeries you have had. Any medical conditions you have. Whether you are pregnant or may be pregnant. What are the risks? Your healthcare provider will talk with you about risks. These may include: Infection. Bleeding. Allergic reactions to medicines. A tear or hole (perforation) in the esophagus, stomach, or duodenum. What happens before the procedure? When to stop eating and drinking Follow instructions from your health care provider about what you may eat and drink. These may include: 8 hours before your  procedure Stop eating most foods. Do not eat meat, fried foods, or fatty foods. Eat only light foods, such as toast or crackers. All liquids are okay except energy drinks and alcohol. 6 hours before your procedure Stop eating. Drink only clear liquids, such as water , clear fruit juice, black coffee, plain tea, and sports drinks. Do not drink energy drinks or alcohol. 2 hours before your procedure Stop drinking all liquids. You may be allowed to take medicines with small sips of water . If you do not follow your health care provider's instructions, your procedure may be delayed or canceled. Medicines Ask your health  care provider about: Changing or stopping your regular medicines. This is especially important if you are taking diabetes medicines or blood thinners. Taking medicines such as aspirin  and ibuprofen . These medicines can thin your blood. Do not take these medicines unless your health care provider tells you to take them. Taking over-the-counter medicines, vitamins, herbs, and supplements. General instructions If you will be going home right after the procedure, plan to have a responsible adult: Take you home from the hospital or clinic. You will not be allowed to drive. Care for you for the time you are told. What happens during the procedure?  An IV will be inserted into one of your veins. You may be given one or more of the following: A medicine to help you relax (sedative). A medicine to numb the throat (local anesthetic). You will lie on your left side on an exam table. Your health care provider will pass the endoscope through your mouth and down your esophagus. Your health care provider will use the scope to check the inside of your esophagus, stomach, and duodenum. Biopsies may be taken. The endoscope will be removed. The procedure may vary among health care providers and hospitals. What happens after the procedure? Your blood pressure, heart rate, breathing rate, and  blood oxygen level will be monitored until you leave the hospital or clinic. When your throat is no longer numb, you may be given some fluids to drink. If you were given a sedative during the procedure, it can affect you for several hours. Do not drive or operate machinery until your health care provider says that it is safe. It is up to you to get the results of your procedure. Ask your health care provider, or the department that is doing the procedure, when your results will be ready. Contact a health care provider if you: Have a sore throat that lasts longer than 1 day. Have a fever. Get help right away if you: Vomit blood or your vomit looks like coffee grounds. Have bloody, black, or tarry stools. Have a very bad sore throat or you cannot swallow. Have difficulty breathing or very bad pain in your chest or abdomen. These symptoms may be an emergency. Get help right away. Call 911. Do not wait to see if the symptoms will go away. Do not drive yourself to the hospital. Summary Upper endoscopy is a procedure to look inside the upper GI tract. During the procedure, an IV will be inserted into one of your veins. You may be given a medicine to help you relax. The endoscope will be passed through your mouth and down your esophagus. Follow instructions from your health care provider about what you can eat and drink. This information is not intended to replace advice given to you by your health care provider. Make sure you discuss any questions you have with your health care provider. Document Revised: 01/22/2022 Document Reviewed: 01/22/2022 Elsevier Patient Education  2024 [employer].

## 2024-09-29 NOTE — Pre-Procedure Instructions (Signed)
 Patient was here when I called her. PAT done in person with patient.  Instructions given to pt.

## 2024-09-29 NOTE — Progress Notes (Signed)
 PERIOPERATIVE PRESCRIPTION FOR IMPLANTED CARDIAC DEVICE PROGRAMMING  Patient Information: Name:  Anita Warren  DOB:  [birth year]  MRN:  [id number]   What type of surgery is being performed? EGD/ ED / TCS   When is surgery scheduled? 10/03/24   What type of clearance is required: Device Clearance   Name of physician performing surgery?  Dr. Carlin Hasty [facility] Phone: [phone], option 5 Fax: [phone]   Anesthesia type (none, local, MAC, general)? MAC Location: [facility] [place]:  [phone] Position during surgery:  left lateral   Device Information:  Clinic EP Physician:  Eulas Furbish, MD   Device Type:  Defibrillator Manufacturer and Phone #:  [employer]: [phone] Pacemaker Dependent?:  No. Date of Last Device Check:  09/07/24 remote,  07/29/24 in office Normal Device Function?:  Yes.    Electrophysiologist's Recommendations:  Have magnet available. Provide continuous ECG monitoring when magnet is used or reprogramming is to be performed.  Procedure will likely interfere with device function.  Device should be programmed:  Tachy therapies disabled.  Okay to use magnet if can maintain in proper positioning, otherwise reprogram with tachy therapies off.    Per Device Clinic Standing Orders, Alan JAYSON Fees, RN  2:24 PM 09/29/2024

## 2024-10-03 ENCOUNTER — Ambulatory Visit (HOSPITAL_COMMUNITY)
Admission: RE | Admit: 2024-10-03 | Discharge: 2024-10-03 | Disposition: A | Attending: Internal Medicine | Admitting: Internal Medicine

## 2024-10-03 ENCOUNTER — Encounter (HOSPITAL_COMMUNITY): Payer: Self-pay | Admitting: Internal Medicine

## 2024-10-03 ENCOUNTER — Encounter (HOSPITAL_COMMUNITY): Admission: RE | Disposition: A | Payer: Self-pay | Source: Home / Self Care | Attending: Internal Medicine

## 2024-10-03 ENCOUNTER — Ambulatory Visit (HOSPITAL_COMMUNITY): Admitting: Anesthesiology

## 2024-10-03 HISTORY — PX: ESOPHAGOGASTRODUODENOSCOPY: SHX5428

## 2024-10-03 HISTORY — PX: COLONOSCOPY: SHX5424

## 2024-10-03 HISTORY — PX: POLYPECTOMY: SHX149

## 2024-10-03 SURGERY — COLONOSCOPY
Anesthesia: Monitor Anesthesia Care

## 2024-10-03 MED ORDER — LIDOCAINE 2% (20 MG/ML) 5 ML SYRINGE
INTRAMUSCULAR | Status: DC | PRN
  Administered 2024-10-03: 60 mg via INTRAVENOUS

## 2024-10-03 MED ORDER — PHENYLEPHRINE 80 MCG/ML (10ML) SYRINGE FOR IV PUSH (FOR BLOOD PRESSURE SUPPORT)
PREFILLED_SYRINGE | INTRAVENOUS | Status: DC | PRN
  Administered 2024-10-03 (×4): 80 ug via INTRAVENOUS

## 2024-10-03 MED ORDER — PROPOFOL 500 MG/50ML IV EMUL
INTRAVENOUS | Status: DC | PRN
  Administered 2024-10-03: 50 mg via INTRAVENOUS
  Administered 2024-10-03: 100 ug/kg/min via INTRAVENOUS
  Administered 2024-10-03: 30 mg via INTRAVENOUS

## 2024-10-03 MED ORDER — DEXMEDETOMIDINE HCL IN NACL 80 MCG/20ML IV SOLN
INTRAVENOUS | Status: DC | PRN
  Administered 2024-10-03: 8 ug via INTRAVENOUS

## 2024-10-03 MED ORDER — MIDAZOLAM HCL 5 MG/5ML IJ SOLN
INTRAMUSCULAR | Status: DC | PRN
  Administered 2024-10-03: 2 mg via INTRAVENOUS

## 2024-10-03 MED ORDER — LACTATED RINGERS IV SOLN: INTRAVENOUS | Status: DC | PRN

## 2024-10-03 MED ORDER — MIDAZOLAM HCL 2 MG/2ML IJ SOLN
INTRAMUSCULAR | Status: AC
  Filled 2024-10-03: qty 2

## 2024-10-03 NOTE — Anesthesia Postprocedure Evaluation (Signed)
 Anesthesia Post Note  Patient: Anita Warren  Procedure(s) Performed: COLONOSCOPY EGD (ESOPHAGOGASTRODUODENOSCOPY) POLYPECTOMY, INTESTINE  Patient location during evaluation: Phase II Anesthesia Type: MAC Level of consciousness: awake Pain management: pain level controlled Vital Signs Assessment: post-procedure vital signs reviewed and stable Respiratory status: spontaneous breathing and respiratory function stable Cardiovascular status: blood pressure returned to baseline and stable Postop Assessment: no headache and no apparent nausea or vomiting Anesthetic complications: no Comments: Late entry   No notable events documented.   Last Vitals:  Vitals:   10/03/24 0853 10/03/24 1043  BP: 106/67 (!) 86/56  Pulse: 63 78  Resp: 14 16  Temp: 36.7 C 36.4 C  SpO2: 100% 97%    Last Pain:  Vitals:   10/03/24 1043  TempSrc: Oral  PainSc: 0-No pain                 Yvonna [name]

## 2024-10-03 NOTE — Op Note (Signed)
 [facility] Patient Name: Anita Warren Procedure Date: 10/03/2024 10:02 AM MRN: [id number] Date of Birth: 1961/04/09 Attending MD: Carlin POUR. Anita Warren , [place], [id number] CSN: [id number] Age: 63 Admit Type: Outpatient Procedure:                Colonoscopy Indications:              Screening for colorectal malignant neoplasm Providers:                Carlin POUR. Cindie, DO, Harlene Lips, Kristine                            L. Boone Tech, Technician Referring MD:              Medicines:                See the Anesthesia note for documentation of the                            administered medications Complications:            No immediate complications. Estimated Blood Loss:     Estimated blood loss was minimal. Procedure:                Pre-Anesthesia Assessment:                           - The anesthesia plan was to use monitored                            anesthesia care (MAC).                           After obtaining informed consent, the colonoscope                            was passed under direct vision. Throughout the                            procedure, the patient's blood pressure, pulse, and                            oxygen saturations were monitored continuously. The                            [id number]-[id number] ([id number]) Peds Colon was introduced                            through the anus and advanced to the the cecum,                            identified by appendiceal orifice and ileocecal                            valve. The colonoscopy was performed without                            difficulty. The patient tolerated the  procedure                            well. The quality of the bowel preparation was                            evaluated using the BBPS [facility] Bowel Preparation                            Scale) with scores of: Right Colon = 3, Transverse                            Colon = 3 and Left Colon = 3 (entire mucosa seen                             well with no residual staining, small fragments of                            stool or opaque liquid). The total BBPS score                            equals 9. Scope In: 10:24:36 AM Scope Out: 10:35:53 AM Scope Withdrawal Time: 0 hours 7 minutes 47 seconds  Total Procedure Duration: 0 hours 11 minutes 17 seconds  Findings:      Non-bleeding internal hemorrhoids were found.      Two pedunculated and sessile polyps were found in the ascending colon       and cecum. The polyps were 5 to 8 mm in size. These polyps were removed       with a cold snare. Resection and retrieval were complete.      The exam was otherwise without abnormality. Impression:               - Non-bleeding internal hemorrhoids.                           - Two 5 to 8 mm polyps in the ascending colon and                            in the cecum, removed with a cold snare. Resected                            and retrieved.                           - The examination was otherwise normal. Moderate Sedation:      Per Anesthesia Care Recommendation:           - Patient has a contact number available for                            emergencies. The signs and symptoms of potential                            delayed complications were discussed with the  patient. Return to normal activities tomorrow.                            Written discharge instructions were provided to the                            patient.                           - Resume previous diet.                           - Continue present medications.                           - Await pathology results.                           - Repeat colonoscopy in 5 years for surveillance                            and family history of colon cancer in brother                           - Return to GI clinic in 6 weeks. Procedure Code(s):        --- Professional ---                           [phone], Colonoscopy, flexible; with removal of                             tumor(s), polyp(s), or other lesion(s) by snare                            technique Diagnosis Code(s):        --- Professional ---                           Z12.11, Encounter for screening for malignant                            neoplasm of colon                           D12.2, Benign neoplasm of ascending colon                           D12.0, Benign neoplasm of cecum                           K64.8, Other hemorrhoids CPT copyright 2022 American Medical Association. All rights reserved. The codes documented in this report are preliminary and upon coder review may  be revised to meet current compliance requirements. Carlin POUR. Cindie, DO Carlin POUR. Cindie, DO 10/03/2024 10:42:32 AM This report has been signed electronically. Number of Addenda: 0

## 2024-10-03 NOTE — Transfer of Care (Signed)
 Immediate Anesthesia Transfer of Care Note  Patient: Anita Warren  Procedure(s) Performed: COLONOSCOPY EGD (ESOPHAGOGASTRODUODENOSCOPY) POLYPECTOMY, INTESTINE  Patient Location: Short Stay  Anesthesia Type:General  Level of Consciousness: awake  Airway & Oxygen Therapy: Patient Spontanous Breathing  Post-op Assessment: Report given to RN and Post -op Vital signs reviewed and stable  Post vital signs: Reviewed and stable  Last Vitals:  Vitals Value Taken Time  BP 86/49   Temp    Pulse 78   Resp 17   SpO2 97%     Last Pain:  Vitals:   10/03/24 1007  TempSrc:   PainSc: 0-No pain      Patients Stated Pain Goal: 5 (10/03/24 0853)  Complications: No notable events documented.

## 2024-10-03 NOTE — Op Note (Signed)
 [facility] Patient Name: Anita Warren Procedure Date: 10/03/2024 10:01 AM MRN: [id number] Date of Birth: 1961/04/22 Attending MD: Carlin POUR. Cindie , [place], [id number] CSN: [id number] Age: 63 Admit Type: Outpatient Procedure:                Upper GI endoscopy Indications:              Epigastric abdominal pain, Dysphagia Providers:                Carlin POUR. Cindie, DO, Harlene Lips, Kristine                            L. Boone Tech, Technician Referring MD:              Medicines:                See the Anesthesia note for documentation of the                            administered medications Complications:            No immediate complications. Estimated Blood Loss:     Estimated blood loss was minimal. Procedure:                Pre-Anesthesia Assessment:                           - The anesthesia plan was to use monitored                            anesthesia care (MAC).                           After obtaining informed consent, the endoscope was                            passed under direct vision. Throughout the                            procedure, the patient's blood pressure, pulse, and                            oxygen saturations were monitored continuously. The                            [id number] ([id number])Leezm was introduced through the                            mouth, and advanced to the second part of duodenum.                            The upper GI endoscopy was accomplished without                            difficulty. The patient tolerated the procedure                            well. Scope In:  10:13:04 AM Scope Out: 10:20:20 AM Total Procedure Duration: 0 hours 7 minutes 16 seconds  Findings:      The Z-line was variable and was found 39 cm from the incisors. Biopsies       were taken with a cold forceps for histology.      Patchy mild inflammation was found in the entire examined stomach.       Biopsies were taken with a cold forceps for  Helicobacter pylori testing.      Mucosal changes characterized by bulging of the ampulla. Biopsies were       taken with a cold forceps for histology. Just proximal to ampulla,       15-63mm nodule/polyp. See pictures.      Pseudomelanosis duodeni throughout the examined duodenum Impression:               - Z-line variable, 39 cm from the incisors.                            Biopsied.                           - Gastritis. Biopsied.                           - Mucosal changes in the duodenum. Biopsied. Moderate Sedation:      Per Anesthesia Care Recommendation:           - Patient has a contact number available for                            emergencies. The signs and symptoms of potential                            delayed complications were discussed with the                            patient. Return to normal activities tomorrow.                            Written discharge instructions were provided to the                            patient.                           - Resume previous diet.                           - Continue present medications.                           - Await pathology results.                           - Return to GI clinic in 6 weeks.                           - May need EUS of ampulla/nodule. Pending pathology  will likely proceed with MRI/MRCP Procedure Code(s):        --- Professional ---                           [phone], Esophagogastroduodenoscopy, flexible,                            transoral; with biopsy, single or multiple Diagnosis Code(s):        --- Professional ---                           K22.89, Other specified disease of esophagus                           K29.70, Gastritis, unspecified, without bleeding                           K31.89, Other diseases of stomach and duodenum                           R10.13, Epigastric pain                           R13.10, Dysphagia, unspecified CPT copyright 2022 American  Medical Association. All rights reserved. The codes documented in this report are preliminary and upon coder review may  be revised to meet current compliance requirements. Carlin POUR. Cindie, DO Carlin POUR. Scarlettrose Costilow, DO 10/03/2024 10:40:38 AM This report has been signed electronically. Number of Addenda: 0

## 2024-10-03 NOTE — Discharge Instructions (Addendum)
 EGD Discharge instructions Please read the instructions outlined below and refer to this sheet in the next few weeks. These discharge instructions provide you with general information on caring for yourself after you leave the hospital. Your doctor may also give you specific instructions. While your treatment has been planned according to the most current medical practices available, unavoidable complications occasionally occur. If you have any problems or questions after discharge, please call your doctor. ACTIVITY You may resume your regular activity but move at a slower pace for the next 24 hours.  Take frequent rest periods for the next 24 hours.  Walking will help expel (get rid of) the air and reduce the bloated feeling in your abdomen.  No driving for 24 hours (because of the anesthesia (medicine) used during the test).  You may shower.  Do not sign any important legal documents or operate any machinery for 24 hours (because of the anesthesia used during the test).  NUTRITION Drink plenty of fluids.  You may resume your normal diet.  Begin with a light meal and progress to your normal diet.  Avoid alcoholic beverages for 24 hours or as instructed by your caregiver.  MEDICATIONS You may resume your normal medications unless your caregiver tells you otherwise.  WHAT YOU CAN EXPECT TODAY You may experience abdominal discomfort such as a feeling of fullness or "gas" pains.  FOLLOW-UP Your doctor will discuss the results of your test with you.  SEEK IMMEDIATE MEDICAL ATTENTION IF ANY OF THE FOLLOWING OCCUR: Excessive nausea (feeling sick to your stomach) and/or vomiting.  Severe abdominal pain and distention (swelling).  Trouble swallowing.  Temperature over 101 F (37.8 C).  Rectal bleeding or vomiting of blood.      Colonoscopy Discharge Instructions  Read the instructions outlined below and refer to this sheet in the next few weeks. These discharge instructions provide you  with general information on caring for yourself after you leave the hospital. Your doctor may also give you specific instructions. While your treatment has been planned according to the most current medical practices available, unavoidable complications occasionally occur.   ACTIVITY You may resume your regular activity, but move at a slower pace for the next 24 hours.  Take frequent rest periods for the next 24 hours.  Walking will help get rid of the air and reduce the bloated feeling in your belly (abdomen).  No driving for 24 hours (because of the medicine (anesthesia) used during the test).   Do not sign any important legal documents or operate any machinery for 24 hours (because of the anesthesia used during the test).  NUTRITION Drink plenty of fluids.  You may resume your normal diet as instructed by your doctor.  Begin with a light meal and progress to your normal diet. Heavy or fried foods are harder to digest and may make you feel sick to your stomach (nauseated).  Avoid alcoholic beverages for 24 hours or as instructed.  MEDICATIONS You may resume your normal medications unless your doctor tells you otherwise.  WHAT YOU CAN EXPECT TODAY Some feelings of bloating in the abdomen.  Passage of more gas than usual.  Spotting of blood in your stool or on the toilet paper.  IF YOU HAD POLYPS REMOVED DURING THE COLONOSCOPY: No aspirin  products for 7 days or as instructed.  No alcohol for 7 days or as instructed.  Eat a soft diet for the next 24 hours.  FINDING OUT THE RESULTS OF YOUR TEST Not all test results  are available during your visit. If your test results are not back during the visit, make an appointment with your caregiver to find out the results. Do not assume everything is normal if you have not heard from your caregiver or the medical facility. It is important for you to follow up on all of your test results.  SEEK IMMEDIATE MEDICAL ATTENTION IF: You have more than a  spotting of blood in your stool.  Your belly is swollen (abdominal distention).  You are nauseated or vomiting.  You have a temperature over 101.  You have abdominal pain or discomfort that is severe or gets worse throughout the day.   Your EGD revealed mild amount inflammation in your stomach.  I took biopsies of this to rule out infection with a bacteria called H. pylori.  I also took send of your esophagus.  In your small bowel, your ampulla appeared to be somewhat bulging.  This is the sphincter that drains your liver and pancreas into your small bowel.  I took biopsies of this today.  Await pathology results, office will contact you.  We will determine next steps afterward.    Your colonoscopy revealed 2 polyp(s) which I removed successfully. Await pathology results, my office will contact you. I recommend repeating colonoscopy in 5 years for surveillance purposes, depending on pathology results.  Otherwise follow up with GI in 6 weeks.  **You can resume your Eliquis  tomorrow morning**  I hope you have a great rest of your week!  Carlin POUR. Cindie, D.O. Gastroenterology and Hepatology [facility]

## 2024-10-03 NOTE — H&P (Signed)
 Primary Care Physician:  Gladis Lauraine BRAVO, NP Primary Gastroenterologist:  Dr. Cindie  Pre-Procedure History & Physical: HPI:  Anita Warren is a 63 y.o. female is here for EGD with possible dilation due to history of dysphagia, epigastric discomfort, and colonoscopy for colon cancer screening.  Past Medical History:  Diagnosis Date   AICD (automatic cardioverter/defibrillator) present    St. Jude AICD implanted 03-07-2011 Dr. Allred/-Dr. Rolan now follows   Atrial fibrillation (HCC)    Cardiomyopathy (HCC) 2012   CHF (congestive heart failure) (HCC)    meds controlling, no episodes since 2014   Chronic systolic heart failure (HCC)    Essential hypertension, benign    Headache    History of kidney stones    multiple kidney stones in past   History of medication noncompliance    Hydronephrosis with renal and ureteral calculus obstruction 09/05/2013   Nonischemic cardiomyopathy (HCC)    LVEF 5-10%, likely viral (no CAD by cath 01/30/11)   NSVT (nonsustained ventricular tachycardia) (HCC)    Obesity     Past Surgical History:  Procedure Laterality Date   ABDOMINAL HYSTERECTOMY     BREAST BIOPSY Left 10/19/2019   Procedure: BREAST BIOPSY WITH NEEDLE LOCALIZATION;  Surgeon: Mavis Anes, MD;  Location: [facility];  Service: General;  Laterality: Left;   BREAST BIOPSY Right 11/18/2022   Intraductal papilloma/US  RT BREAST BX W LOC DEV 1ST LESION IMG BX SPEC US  GUIDE 11/18/2022 [facility]   BREAST BIOPSY Left 12/22/2023   US  LT BREAST BX W LOC DEV 1ST LESION IMG BX SPEC US  GUIDE 12/22/2023 [facility]   BREAST BIOPSY Left 03/29/2024   US  LT RADIOACTIVE SEED LOC 03/29/2024 [facility]   BREAST BIOPSY  03/29/2024   MM LT RADIOACTIVE SEED LOC MAMMO GUIDE 03/29/2024 [facility]   BREAST BIOPSY WITH RADIO FREQUENCY LOCALIZER Right 01/14/2023   Intraductal papilloma/Procedure: BREAST BIOPSY WITH RADIO FREQUENCY LOCALIZER;  Surgeon: Mavis Anes, MD;  Location: [facility];  Service:  General;  Laterality: Right;   BREAST LUMPECTOMY  1989   L breast- benign   CARDIAC CATHETERIZATION     CARDIAC DEFIBRILLATOR PLACEMENT  02/2011   SJM by MILUS   CESAREAN SECTION     x 2   CHOLECYSTECTOMY     COLONOSCOPY  07/02/2012   Procedure: COLONOSCOPY;  Surgeon: Margo LITTIE Haddock, MD;  Location: [facility];  Service: Endoscopy;  Laterality: N/A;  1:15/PATIENT HAS A DEFIBRILLATOR   CYSTOSCOPY W/ URETERAL STENT PLACEMENT Right 09/06/2013   Procedure: CYSTOSCOPY WITH RIGHT RETROGRADE PYELOGRAM; RIGHT URETERAL STENT PLACEMENT;  Surgeon: Mohammad I Javaid, MD;  Location: [facility];  Service: Urology;  Laterality: Right;   CYSTOSCOPY W/ URETERAL STENT PLACEMENT N/A 04/28/2016   Procedure: CYSTOSCOPY WITH  RIGHT RETROGRADE PYELOGRAM/RIGHT URETERAL STENT PLACEMENT;  Surgeon: Alm Fragmin, MD;  Location: [facility];  Service: Urology;  Laterality: N/A;   CYSTOSCOPY W/ URETERAL STENT REMOVAL Right 07/21/2016   Procedure: CYSTOSCOPY WITH STENT REMOVAL;  Surgeon: Garnette Shack, MD;  Location: [facility];  Service: Urology;  Laterality: Right;   CYSTOSCOPY/URETEROSCOPY/HOLMIUM LASER/STENT PLACEMENT Right 07/21/2016   Procedure: CYSTOSCOPY/URETEROSCOPY/HOLMIUM LASER/   right retrograde pylegram;  Surgeon: Garnette Shack, MD;  Location: [facility];  Service: Urology;  Laterality: Right;   endovenous laser ablation and stab phlebectomies Right 08/01/2020   EVLA of right greater saphenous vein and > 20 stab phlebectomies   EXCISION OF NERVE MASS, AXILLA  03/31/2024   Procedure: EXCISION OF AXILLARY MASS;  Surgeon: Aron Shoulders, MD;  Location:  [facility];  Service: General;;   ICD GENERATOR CHANGEOUT N/A 12/05/2022   Procedure: ICD GENERATOR CHANGEOUT;  Surgeon: Mealor, [name], MD;  Location: [facility]VAS[facility] CV [facility];  Service: Cardiovascular;  Laterality: N/A;   RADIOACTIVE SEED GUIDED AXILLARY SENTINEL LYMPH NODE Left 03/31/2024   Procedure: RADIOACTIVE SEED GUIDED EXCISIONAL BREAST BIOPSY;  Surgeon: Aron Shoulders, MD;   Location: [facility];  Service: General;  Laterality: Left;   TONSILLECTOMY     TUBAL LIGATION      Prior to Admission medications   Medication Sig Start Date End Date Taking? Authorizing Provider  carvedilol  (COREG ) 25 MG tablet TAKE 1 TABLET BY MOUTH TWICE DAILY WITH A MEAL 12/16/23  Yes McLean, Dalton S, MD  Coenzyme Q10 (CO Q 10) 100 MG CAPS Take 100 mg by mouth in the morning.   Yes [provider]  diphenhydrAMINE  (BENADRYL ) 25 MG tablet Take 25 mg by mouth every 6 (six) hours as needed for allergies.   Yes [provider]  furosemide  (LASIX ) 20 MG tablet Take 1 tablet by mouth once daily 09/08/24  Yes McLean, Dalton S, MD  hydrALAZINE  (APRESOLINE ) 100 MG tablet TAKE 1 TABLET BY MOUTH THREE TIMES DAILY 08/24/24  Yes [place], Cowan, FNP  isosorbide  mononitrate (IMDUR ) 30 MG 24 hr tablet Take 3 tablets (90 mg total) by mouth daily. 06/06/24  Yes Rolan Ezra RAMAN, MD  potassium chloride  (KLOR-CON ) 10 MEQ tablet Take 1 tablet (10 mEq total) by mouth daily. 08/19/24  Yes Rolan Ezra RAMAN, MD  spironolactone  (ALDACTONE ) 50 MG tablet Take 1 tablet by mouth once daily 09/08/24  Yes McLean, Dalton S, MD  acetaminophen  (TYLENOL ) 500 MG tablet Take 1,000 mg by mouth every 6 (six) hours as needed for headache.     [provider]  apixaban  (ELIQUIS ) 5 MG TABS tablet Take 1 tablet by mouth twice daily 08/24/24   Glena Harlene HERO, FNP  FARXIGA  10 MG TABS tablet TAKE 1 TABLET BY MOUTH ONCE DAILY BEFORE BREAKFAST 08/15/24   Rolan Ezra RAMAN, MD    Allergies as of 09/20/2024 - Review Complete 09/13/2024  Allergen Reaction Noted   Peanut-containing drug products Anaphylaxis 04/27/2012   Ace inhibitors Swelling    Morphine  Nausea And Vomiting    Demerol  Rash 03/21/2011    Family History  Adopted: Yes  Problem Relation Age of Onset   Diabetes Mother    Breast cancer Sister    Diabetes Sister        borderline   Colon cancer Brother 54   Hypertension Brother      Social History   Socioeconomic History   Marital status: Married    Spouse name: Not on file   Number of children: Not on file   Years of education: Not on file   Highest education level: Not on file  Occupational History   Occupation: Curator: [employer]    Comment: [employer], was at [employer] for 16 years   Tobacco Use   Smoking status: Never   Smokeless tobacco: Never  Vaping Use   Vaping status: Never Used  Substance and Sexual Activity   Alcohol use: No   Drug use: No   Sexual activity: Yes    Birth control/protection: Surgical    Comment: hyst  Other Topics Concern   Not on file  Social History Narrative   Lives in [place] with spouse.  3 grown children.  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  Social Drivers of Corporate Investment Banker Strain: Low Risk  (05/04/2020)   Overall Financial Resource Strain (CARDIA)    Difficulty of Paying Living Expenses: Not hard at all  Food Insecurity: No Food Insecurity (05/04/2020)   Hunger Vital Sign    Worried About Running Out of Food in the Last Year: Never true    Ran Out of Food in the Last Year: Never true  Transportation Needs: No Transportation Needs (05/04/2020)   PRAPARE - Administrator, Civil Service (Medical): No    Lack of Transportation (Non-Medical): No  Physical Activity: Sufficiently Active (05/04/2020)   Exercise Vital Sign    Days of Exercise per Week: 5 days    Minutes of Exercise per Session: 40 min  Stress: No Stress Concern Present (05/04/2020)   [employer] of Occupational Health - Occupational Stress Questionnaire    Feeling of Stress : Not at all  Social Connections: Moderately Integrated (05/04/2020)   Social Connection and Isolation Panel    Frequency of Communication with Friends and Family: More than three times a week    Frequency of Social Gatherings with Friends and Family: More than three times a week    Attends Religious Services: More than 4  times per year    Active Member of [employer] or Organizations: Yes    Attends Engineer, Structural: More than 4 times per year    Marital Status: Divorced  Intimate Partner Violence: Not At Risk (05/04/2020)   Humiliation, Afraid, Rape, and Kick questionnaire    Fear of Current or Ex-Partner: No    Emotionally Abused: No    Physically Abused: No    Sexually Abused: No    Review of Systems: General: Negative for fever, chills, fatigue, weakness. Eyes: Negative for vision changes.  ENT: Negative for hoarseness, difficulty swallowing , nasal congestion. CV: Negative for chest pain, angina, palpitations, dyspnea on exertion, peripheral edema.  Respiratory: Negative for dyspnea at rest, dyspnea on exertion, cough, sputum, wheezing.  GI: See history of present illness. GU:  Negative for dysuria, hematuria, urinary incontinence, urinary frequency, nocturnal urination.  MS: Negative for joint pain, low back pain.  Derm: Negative for rash or itching.  Neuro: Negative for weakness, abnormal sensation, seizure, frequent headaches, memory loss, confusion.  Psych: Negative for anxiety, depression Endo: Negative for unusual weight change.  Heme: Negative for bruising or bleeding. Allergy: Negative for rash or hives.  Physical Exam: Vital signs in last 24 hours: Temp:  [98.1 F (36.7 C)] 98.1 F (36.7 C) (12/08 0853) Pulse Rate:  [63] 63 (12/08 0853) Resp:  [14] 14 (12/08 0853) BP: (106)/(67) 106/67 (12/08 0853) SpO2:  [100 %] 100 % (12/08 0853)   General:   Alert,  Well-developed, well-nourished, pleasant and cooperative in NAD Head:  Normocephalic and atraumatic. Eyes:  Sclera clear, no icterus.   Conjunctiva pink. Ears:  Normal auditory acuity. Nose:  No deformity, discharge,  or lesions. Msk:  Symmetrical without gross deformities. Normal posture. Extremities:  Without clubbing or edema. Neurologic:  Alert and  oriented x4;  grossly normal neurologically. Skin:  Intact without  significant lesions or rashes. Psych:  Alert and cooperative. Normal mood and affect.   Impression/Plan: Anita Warren is here for an EGD with possible dilation due to history of dysphagia, epigastric discomfort, and colonoscopy for colon cancer screening.   Risks, benefits, limitations, imponderables and alternatives regarding procedure have been reviewed with the patient. Questions have been answered. All parties agreeable.

## 2024-10-03 NOTE — Anesthesia Preprocedure Evaluation (Signed)
 Anesthesia Evaluation  Patient identified by MRN, date of birth, ID band Patient awake    Reviewed: Allergy & Precautions, H&P , NPO status , Patient's Chart, lab work & pertinent test results, reviewed documented beta blocker date and time   Airway Mallampati: II  TM Distance: >3 FB Neck ROM: full    Dental no notable dental hx.    Pulmonary neg pulmonary ROS   Pulmonary exam normal breath sounds clear to auscultation       Cardiovascular Exercise Tolerance: Good hypertension, +CHF  negative cardio ROS + Cardiac Defibrillator  Rhythm:regular Rate:Normal     Neuro/Psych  Headaches negative neurological ROS  negative psych ROS   GI/Hepatic negative GI ROS, Neg liver ROS,,,  Endo/Other  negative endocrine ROS    Renal/GU Renal diseasenegative Renal ROS  negative genitourinary   Musculoskeletal   Abdominal   Peds  Hematology negative hematology ROS (+)   Anesthesia Other Findings 1. Left ventricular ejection fraction, by estimation, is 30 to 35%. The  left ventricle has moderately decreased function. The left ventricle  demonstrates global hypokinesis. The left ventricular internal cavity size  was moderately dilated. Left  ventricular diastolic parameters are consistent with Grade I diastolic  dysfunction (impaired relaxation). The average left ventricular global  longitudinal strain is -14.1 %. The global longitudinal strain is  abnormal.   2. Right ventricular systolic function is mildly reduced. The right  ventricular size is normal.   3. A small pericardial effusion is present.   4. The mitral valve is normal in structure. Trivial mitral valve  regurgitation. No evidence of mitral stenosis.   5. The aortic valve is tricuspid. Aortic valve regurgitation is mild.  Aortic valve sclerosis is present, with no evidence of aortic valve  stenosis.   6. The inferior vena cava is normal in size with greater than 50%   respiratory variability, suggesting right atrial pressure of 3 mmHg.    Reproductive/Obstetrics negative OB ROS                              Anesthesia Physical Anesthesia Plan  ASA: 3  Anesthesia Plan: MAC   Post-op Pain Management:    Induction:   PONV Risk Score and Plan: Propofol  infusion  Airway Management Planned:   Additional Equipment:   Intra-op Plan:   Post-operative Plan:   Informed Consent: I have reviewed the patients History and Physical, chart, labs and discussed the procedure including the risks, benefits and alternatives for the proposed anesthesia with the patient or authorized representative who has indicated his/her understanding and acceptance.     Dental Advisory Given  Plan Discussed with: CRNA  Anesthesia Plan Comments:         Anesthesia Quick Evaluation

## 2024-10-04 ENCOUNTER — Encounter (HOSPITAL_COMMUNITY): Payer: Self-pay | Admitting: Internal Medicine

## 2024-10-04 LAB — SURGICAL PATHOLOGY

## 2024-10-06 ENCOUNTER — Encounter (HOSPITAL_COMMUNITY)

## 2024-10-18 ENCOUNTER — Telehealth (HOSPITAL_COMMUNITY): Payer: Self-pay

## 2024-10-18 NOTE — Progress Notes (Signed)
 "     Advanced Heart Failure Clinic Note   PCP: Gladis Lauraine BRAVO, NP HF Cardiology: Dr. Rolan  Chief complaint: CHF  HPI: Anita Warren is a 63 y.o. female with history of viral, non-ischemic cardiomyopathy, severe systolic dysfunction with EF of 10% s/p ICD pacemaker in the setting of VT 02/2011 (St. Jude);  cath November 03, 2011 with no CAD.   Admitted 7/15 with acute on chronic systolic CHF she was not taking medications for 4 months because she lost her insurance. She was diuresed with IV lasix  and transitioned to lasix  40 mg daily. She was also discharged on 12.5 mg carvedilol  twice a day, hydralazine  25 mg tid, and 30 mg Imdur  daily. Discharge weight was 176 pounds. She was not on an ACEI due to angioedema.  Admitted from HF clinic 06/02/14 with low output heart failure. Swan placed and showed cardiogenic shock. Ultimately discharged on  Milrinone  0.25 mcg/kg/min. Hospital stay was complicated by atrial flutter. Loaded on amiodarone  and started on eliquis  5 mg twice a day. Met with Dr Fleeta Ochoa and VAD coordinator. She was titrated off the milrinone .  Echo in 8/17 showed EF up to 50%, echo in 8/18 showed EF 45%.  Echo in 11/19 showed EF 35-40%. Echo in 11/21 showed EF stable 40% with normal RV.  Echo in 12/22 showed EF 35-40%, moderate LV dilation, mildly decreased RV systolic function, moderate pericardial effusion.   Echo 6/24, EF 35% with diffuse hypokinesis, mildly decreased RV systolic function, IVC normal.   Echo 05/2024, EF 30% with mild LV dilation, mildly decreased RV systolic function, small pericardial effusion.   Today she returns for HF follow up.Overall feeling fine. No issues. Can't walk as far she wants to.  Denies SOB/PND/Orthopnea. Appetite ok. No fever or chills. Weight at home  pounds. Taking all medications  St Jude device interrogation: No VT. Thoracic Impedance at baseline.     - Echo (7/15): EF 5-10% RV mod to severely dilated.  - Echo (10/15): EF 20-25% RV read as  normal - Echo (8/17):  EF 50%, mild LV dilation, normal RV size and systolic function.  - Echo (8/18): EF 45%, diffuse HK worse inferiorly, normal RV size and systolic function.  - Echo (11/19): EF 35-40%, mild LV dilation with diffuse hypokinesis, normal RV size with mildly decreased systolic function.  - Echo (11/21): EF 40%, hypokinesis of the basal to mid septum and the basal inferior wall, RV normal, small pericardial effusion.  - Echo (12/22): EF 35-40%, moderate LV dilation, mildly decreased RV systolic function, moderate pericardial effusion. - Echo (6/24): EF 35% with diffuse hypokinesis, mildly decreased RV systolic function, IVC normal.  - Echo (8/25): EF 30% with mild LV dilation, mildly decreased RV systolic function, small pericardial effusion.   Labs:  HIV negative 2015  2/24: K 3.6, creatinine 1.5 6/24: K 4.1, creatinine 1.44 6/25: K 3.9, creatinine 1.52 06/06/24: K 3.7 Creatinine 1.6  SH: Disabled. Has 3 grown children. Does not smoke or drink alcohol.  Re-married (fiance passed away in Nov 02, 2020). 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    FH: Mom died at 7 CAD         Sister and Brother HTN   Review of systems complete and found to be negative unless listed in HPI.    Past Medical History:  Diagnosis Date   AICD (automatic cardioverter/defibrillator) present    St. Jude AICD implanted 03-07-2011 Dr. Allred/-Dr. Rolan now follows   Atrial fibrillation (HCC)    Cardiomyopathy (HCC)  2012   CHF (congestive heart failure) (HCC)    meds controlling, no episodes since 2014   Chronic systolic heart failure (HCC)    Essential hypertension, benign    Headache    History of kidney stones    multiple kidney stones in past   History of medication noncompliance    Hydronephrosis with renal and ureteral calculus obstruction 09/05/2013   Nonischemic cardiomyopathy (HCC)    LVEF 5-10%, likely viral (no CAD by cath 01/30/11)   NSVT (nonsustained ventricular tachycardia) (HCC)    Obesity     Current Outpatient Medications  Medication Sig Dispense Refill   acetaminophen  (TYLENOL ) 500 MG tablet Take 1,000 mg by mouth every 6 (six) hours as needed for headache.      apixaban  (ELIQUIS ) 5 MG TABS tablet Take 1 tablet by mouth twice daily 180 tablet 1   carvedilol  (COREG ) 25 MG tablet TAKE 1 TABLET BY MOUTH TWICE DAILY WITH A MEAL 180 tablet 3   Coenzyme Q10 (CO Q 10) 100 MG CAPS Take 100 mg by mouth in the morning.     diphenhydrAMINE  (BENADRYL ) 25 MG tablet Take 25 mg by mouth every 6 (six) hours as needed for allergies.     FARXIGA  10 MG TABS tablet TAKE 1 TABLET BY MOUTH ONCE DAILY BEFORE BREAKFAST 90 tablet 1   furosemide  (LASIX ) 20 MG tablet Take 1 tablet by mouth once daily 90 tablet 0   hydrALAZINE  (APRESOLINE ) 100 MG tablet TAKE 1 TABLET BY MOUTH THREE TIMES DAILY 270 tablet 1   isosorbide  mononitrate (IMDUR ) 30 MG 24 hr tablet Take 3 tablets (90 mg total) by mouth daily. 90 tablet 11   potassium chloride  (KLOR-CON ) 10 MEQ tablet Take 1 tablet (10 mEq total) by mouth daily. 90 tablet 0   spironolactone  (ALDACTONE ) 50 MG tablet Take 1 tablet by mouth once daily 90 tablet 0   No current facility-administered medications for this encounter.   BP 110/72   Pulse 69   Ht 5' 4 (1.626 m)   Wt 77.6 kg (171 lb)   LMP 10/28/1999   SpO2 99%   BMI 29.35 kg/m   Wt Readings from Last 3 Encounters:  10/19/24 77.6 kg (171 lb)  09/29/24 78 kg (172 lb)  08/29/24 78.6 kg (173 lb 3.2 oz)   PHYSICAL EXAM: General:   No resp difficulty Neck: no JVD.  Cor: Regular rate & rhythm.  Lungs: clear Abdomen: soft, nontender, nondistended.  Extremities: no  edema Neuro: alert & oriented x3  ASSESSMENT & PLAN: 1. Chronic Systolic Heart Failure: Nonischemic cardiomyopathy thought to potentially be due to viral myocarditis noted initially in 2011. Cath 2012 normal coronaries.  St Jude ICD. Echo (7/15) with EF 5-10% and RV mod-severely dilated with moderately decreased systolic function.  Repeat echo 10/15 with EF up to 20-25%. She was on milrinone  briefly then stopped.  Echo in 8/17 with EF up to 50%. Repeat echo 11/19 showed EF back down to 35-40% with normal RV. Echo in 11/21 showed stable EF 40% and septal + inferior wall motion abnormalities.  Echo in 12/22 showed EF 35-40%, mild RV dysfunction, moderate pericardial effusion.  Echo 6/24 with EF 35%, mild RV dysfunction. Echo 06/06/2024  EF 30% with mild LV dilation, mildly decreased RV systolic function, small pericardial effusion.   NYHA I. Doing great! Corvue.- No VT. Impedance at threshold. Appear euvolemic.   - Continue Lasix  20 mg daily + 10 KCL  - Continue Farxiga  10 mg daily.  - Continue  spironolactone  50 mg daily.  - Continue Coreg  25 mg bid.   - Continue hydralazine  100 mg tid and increase Imdur  to 90 mg daily.  - No ACEI/ARB/ARNI due to recurrent episodes of angioedema with ACEI and also ARB.  - Check BMET  2. Atrial flutter: Paroxysmal -Regular on exam.  - She is now off amiodarone .  If atrial flutter recurs, should have ablation.  - Continue Eliquis  5 mg bid.  3. Angioedema: Had in past on ACEI, had another episode after ACEI stopped.  She is no longer on ACEI, ARNI, or ARB. ? Hereditary or acquired C1 inhibitor deficiency. No further problems with this. 4. CKD: Stage 3b. Baseline SCr 1.3-1.5. - Continue SGLT2i.  - Check BMET    Follow up  3-4 months with Dr Rolan.   Greig Mosses, NP  10/19/2024 "

## 2024-10-18 NOTE — Telephone Encounter (Signed)
 Called to confirm/remind patient of their appointment at the [facility] on 10/19/24.   Appointment:   [x] Confirmed  [] Left mess   [] No answer/No voice mail  [] VM Full/unable to leave message  [] Phone not in service  Patient reminded to bring all medications and/or complete list.  Confirmed patient has transportation. Gave directions, instructed to utilize valet parking.

## 2024-10-19 ENCOUNTER — Ambulatory Visit (HOSPITAL_COMMUNITY): Payer: Self-pay | Admitting: Adult Health

## 2024-10-19 ENCOUNTER — Encounter (HOSPITAL_COMMUNITY): Payer: Self-pay

## 2024-10-19 ENCOUNTER — Ambulatory Visit (HOSPITAL_COMMUNITY)
Admission: RE | Admit: 2024-10-19 | Discharge: 2024-10-19 | Disposition: A | Discharge status: Home / Self Care | Source: Ambulatory Visit | Attending: Adult Health | Admitting: Adult Health

## 2024-10-19 VITALS — BP 110/72 | HR 69 | Ht 64.0 in | Wt 171.0 lb

## 2024-10-19 DIAGNOSIS — Z7984 Long term (current) use of oral hypoglycemic drugs: Secondary | ICD-10-CM | POA: Insufficient documentation

## 2024-10-19 DIAGNOSIS — B3324 Viral cardiomyopathy: Secondary | ICD-10-CM | POA: Diagnosis not present

## 2024-10-19 DIAGNOSIS — Z95810 Presence of automatic (implantable) cardiac defibrillator: Secondary | ICD-10-CM | POA: Diagnosis not present

## 2024-10-19 DIAGNOSIS — N1832 Chronic kidney disease, stage 3b: Secondary | ICD-10-CM | POA: Diagnosis not present

## 2024-10-19 DIAGNOSIS — I4892 Unspecified atrial flutter: Secondary | ICD-10-CM | POA: Diagnosis not present

## 2024-10-19 DIAGNOSIS — Z79899 Other long term (current) drug therapy: Secondary | ICD-10-CM | POA: Diagnosis not present

## 2024-10-19 DIAGNOSIS — I428 Other cardiomyopathies: Secondary | ICD-10-CM | POA: Diagnosis not present

## 2024-10-19 DIAGNOSIS — I472 Ventricular tachycardia, unspecified: Secondary | ICD-10-CM | POA: Diagnosis not present

## 2024-10-19 DIAGNOSIS — Z7901 Long term (current) use of anticoagulants: Secondary | ICD-10-CM | POA: Insufficient documentation

## 2024-10-19 DIAGNOSIS — I5022 Chronic systolic (congestive) heart failure: Secondary | ICD-10-CM | POA: Insufficient documentation

## 2024-10-19 LAB — BASIC METABOLIC PANEL WITH GFR
Anion gap: 7 (ref 5–15)
BUN: 17 mg/dL (ref 8–23)
CO2: 25 mmol/L (ref 22–32)
Calcium: 9.1 mg/dL (ref 8.9–10.3)
Chloride: 104 mmol/L (ref 98–111)
Creatinine, Ser: 1.42 mg/dL — ABNORMAL HIGH (ref 0.44–1.00)
GFR, Estimated: 41 mL/min — ABNORMAL LOW
Glucose, Bld: 107 mg/dL — ABNORMAL HIGH (ref 70–99)
Potassium: 3.9 mmol/L (ref 3.5–5.1)
Sodium: 136 mmol/L (ref 135–145)

## 2024-10-19 NOTE — Patient Instructions (Signed)
 Medication Changes:  No Changes In Medications at this time.   Lab Work:  Labs done today, your results will be available in MyChart, we will contact you for abnormal readings.  Follow-Up in: 4 months with Dr. Rolan PLEASE CALL OUR OFFICE AROUND February  TO GET SCHEDULED FOR YOUR APPOINTMENT. PHONE NUMBER IS [phone] OPTION 2   At the [facility], you and your health needs are our priority. We have a designated team specialized in the treatment of Heart Failure. This Care Team includes your primary Heart Failure Specialized Cardiologist (physician), Advanced Practice Providers (APPs- Physician Assistants and Nurse Practitioners), and Pharmacist who all work together to provide you with the care you need, when you need it.   You may see any of the following providers on your designated Care Team at your next follow up:  Dr. Toribio Fuel Dr. Ezra Rolan Dr. Odis Brownie Greig Mosses, NP Caffie Shed, [place] [facility] [place], [place] Beckey Coe, NP Jordan Lee, NP Tinnie Redman, PharmD   Please be sure to bring in all your medications bottles to every appointment.   Need to Contact Us :  If you have any questions or concerns before your next appointment please send us  a message through [place] or call our office at [phone].    TO LEAVE A MESSAGE FOR THE NURSE SELECT OPTION 2, PLEASE LEAVE A MESSAGE INCLUDING: YOUR NAME DATE OF BIRTH CALL BACK NUMBER REASON FOR CALL**this is important as we prioritize the call backs  YOU WILL RECEIVE A CALL BACK THE SAME DAY AS LONG AS YOU CALL BEFORE 4:00 PM

## 2024-10-31 ENCOUNTER — Ambulatory Visit: Attending: Cardiovascular Disease

## 2024-10-31 DIAGNOSIS — Z95810 Presence of automatic (implantable) cardiac defibrillator: Secondary | ICD-10-CM | POA: Diagnosis not present

## 2024-10-31 DIAGNOSIS — I5022 Chronic systolic (congestive) heart failure: Secondary | ICD-10-CM

## 2024-11-02 NOTE — Progress Notes (Signed)
 EPIC Encounter for ICM Monitoring  Patient Name: Anita Warren is a 64 y.o. female Date: 11/02/2024 Primary Care Physican: Anita Lauraine BRAVO, NP Primary Cardiologist: Rolan Electrophysiologist: Mealor 11/27/2023 Weight: 174 lbs 01/01/2024 Weight: 174 lbs            02/03/2024 Weight: 171 lbs     04/12/2024 Weight: 175 lbs     06/06/2024 Weight: 172 lbs  07/29/2024 Office Weight: 174 lbs   08/16/2024 Weight: 172 lbs                                    Spoke with patient and heart failure questions reviewed.  Transmission results reviewed.  Pt asymptomatic for fluid accumulation.  Reports feeling well at this time and voices no complaints.     Diet:  Fluid intake is not consistent   Since 09/19/2024 ICM Remote Transmission:  Coruve thoracic impedance suggesting intermittent days with possible fluid accumulation.   Prescribed:  Furosemide  20 mg take 1 tablet (20 mg total) daily  Potassium 10 mEq take 1 tablet daily. Spironolactone  50 mg take 1 tablet daily     Labs: 10/19/2024 Creatinine 1.42, BUN 17, Potassium 3.9, Sodium 136, GFR 41 06/06/2024 Creatinine 1.60, BUN 21, Potassium 3.7, Sodium 136, GFR 36 03/29/2024 Creatinine 1.52, BUN 21, Potassium 3.9, Sodium 136, GFR 39 01/06/2024 Creatinine 1.74, BUN 22, Potassium 4.4, Sodium 137, GFR 33 A complete set of results can be found in Results Review.   Recommendations:  No changes and encouraged to call if experiencing any fluid symptoms.   Follow-up plan: ICM clinic phone appointment on 12/08/2024.   91 day device clinic remote transmission 12/07/2024.      EP/Cardiology Office Visits:  Recall 07/24/2025 with Dr Nancey.     Copy of ICM check sent to Dr. Nancey.    Remote monitoring is medically necessary for Heart Failure Management.    Daily Thoracic Impedance ICM trend: 08/02/2024 through 10/31/2024.    12-14 Month Thoracic Impedance ICM trend:     Anita GORMAN Garner, RN 11/02/2024 12:51 PM

## 2024-11-04 ENCOUNTER — Encounter: Payer: Self-pay | Admitting: Internal Medicine

## 2024-11-10 ENCOUNTER — Encounter: Payer: Self-pay | Admitting: Cardiology

## 2024-11-18 ENCOUNTER — Other Ambulatory Visit (HOSPITAL_COMMUNITY): Payer: Self-pay | Admitting: Cardiology

## 2024-11-18 DIAGNOSIS — I5022 Chronic systolic (congestive) heart failure: Secondary | ICD-10-CM

## 2024-11-24 NOTE — Progress Notes (Signed)
 31 day ICM Remote transmission canceled due to [facility] clinic is on hold until further notice.  91 day remote monitoring will continue per protocol.

## 2024-12-08 ENCOUNTER — Ambulatory Visit
